# Patient Record
Sex: Female | Born: 1935 | Race: White | Hispanic: No | Smoking: Former smoker
Health system: Southern US, Community
[De-identification: ages and names within clinical notes are randomized; demographics above are authoritative.]

## PROBLEM LIST (undated history)

## (undated) DIAGNOSIS — R55 Syncope and collapse: Secondary | ICD-10-CM

## (undated) DIAGNOSIS — G309 Alzheimer's disease, unspecified: Secondary | ICD-10-CM

## (undated) DIAGNOSIS — E7800 Pure hypercholesterolemia, unspecified: Secondary | ICD-10-CM

## (undated) DIAGNOSIS — F0280 Dementia in other diseases classified elsewhere without behavioral disturbance: Secondary | ICD-10-CM

## (undated) DIAGNOSIS — D509 Iron deficiency anemia, unspecified: Secondary | ICD-10-CM

## (undated) DIAGNOSIS — T7840XA Allergy, unspecified, initial encounter: Secondary | ICD-10-CM

## (undated) DIAGNOSIS — M1990 Unspecified osteoarthritis, unspecified site: Secondary | ICD-10-CM

## (undated) DIAGNOSIS — E785 Hyperlipidemia, unspecified: Secondary | ICD-10-CM

## (undated) DIAGNOSIS — F329 Major depressive disorder, single episode, unspecified: Secondary | ICD-10-CM

## (undated) DIAGNOSIS — I639 Cerebral infarction, unspecified: Secondary | ICD-10-CM

## (undated) DIAGNOSIS — R197 Diarrhea, unspecified: Secondary | ICD-10-CM

## (undated) DIAGNOSIS — I10 Essential (primary) hypertension: Secondary | ICD-10-CM

## (undated) DIAGNOSIS — N189 Chronic kidney disease, unspecified: Secondary | ICD-10-CM

## (undated) HISTORY — DX: Major depressive disorder, single episode, unspecified: F32.9

## (undated) HISTORY — PX: TONSILLECTOMY: SUR1361

## (undated) HISTORY — DX: Pure hypercholesterolemia, unspecified: E78.00

## (undated) HISTORY — DX: Allergy, unspecified, initial encounter: T78.40XA

## (undated) HISTORY — DX: Dementia in other diseases classified elsewhere, unspecified severity, without behavioral disturbance, psychotic disturbance, mood disturbance, and anxiety: F02.80

## (undated) HISTORY — DX: Chronic kidney disease, unspecified: N18.9

## (undated) HISTORY — DX: Alzheimer's disease, unspecified: G30.9

## (undated) HISTORY — DX: Essential (primary) hypertension: I10

## (undated) HISTORY — DX: Syncope and collapse: R55

## (undated) HISTORY — DX: Unspecified osteoarthritis, unspecified site: M19.90

## (undated) HISTORY — PX: OTHER SURGICAL HISTORY: SHX169

## (undated) HISTORY — DX: Diarrhea, unspecified: R19.7

---

## 1999-08-02 ENCOUNTER — Other Ambulatory Visit: Admission: RE | Admit: 1999-08-02 | Discharge: 1999-08-02 | Payer: Self-pay | Admitting: Family Medicine

## 2000-11-07 ENCOUNTER — Other Ambulatory Visit: Admission: RE | Admit: 2000-11-07 | Discharge: 2000-11-07 | Payer: Self-pay | Admitting: Family Medicine

## 2001-03-19 ENCOUNTER — Encounter (INDEPENDENT_AMBULATORY_CARE_PROVIDER_SITE_OTHER): Payer: Self-pay

## 2001-03-19 ENCOUNTER — Ambulatory Visit (HOSPITAL_COMMUNITY): Admission: RE | Admit: 2001-03-19 | Discharge: 2001-03-19 | Payer: Self-pay | Admitting: Gastroenterology

## 2002-08-28 ENCOUNTER — Other Ambulatory Visit: Admission: RE | Admit: 2002-08-28 | Discharge: 2002-08-28 | Payer: Self-pay | Admitting: Family Medicine

## 2008-11-06 ENCOUNTER — Encounter: Admission: RE | Admit: 2008-11-06 | Discharge: 2008-11-06 | Payer: Self-pay | Admitting: Family Medicine

## 2011-04-08 NOTE — Procedures (Signed)
[facility]  Patient:    [name], [name]                      MRN: [id number] Proc. Date: 03/19/01 Adm. Date:  [id number] Attending:  Rich Brave CC:         Desma Maxim, M.D.   Procedure Report  PROCEDURE:  Colonoscopy with polypectomy and hot biopsy.  INDICATIONS:  A 77 year old female with multiple polyps found on flexible sigmoidoscopy, which were adenomatous in character.  FINDINGS:  Six polyps removed.  PROCEDURE:  The nature, purpose and risks of the procedure had been discussed with the patient, who provided written consent.  Sedation was fentanyl 75 mcg and Versed 6 mg IV; without arrhythmias or desaturation.  The Olympus adult video colonoscope was advanced to the cecum, turning the patient into the supine and ultimately the right lateral decubitus positions to facilitate advancement.  I encountered a total of six polyps on this examination.  Two were small, semipedunculated polyps near 50 cm, one of which was hot biopsied and I believe another was snared off.  At 40 cm I encountered two additional small sessile polyps, again this time removed by snare technique; but without cautery because the snare came right through the very soft tissue.  Finally, at about 10 cm from the external anal opening in the rectum, I encountered two polyps; one about 10 mm and the other about 12 mm in diameter, each removed by snare technique with complete hemostasis and no evidence of excessive cautery.  No evidence of cancer was noted anywhere in the colon, nor any evidence of colitis, vascular malformations or diverticular disease.  Retroflexion was not performed in the rectum, due to the proximity of the rectal polypectomies.  The patient tolerated the procedure well and there were no apparent complications.  The quality of the prep was quite good, and it was felt that all areas were well seen; although in the proximal portion of the  ascending colon, there was some residual stool film.  IMPRESSION:  Colon polyps, removed as described above.  PLAN:  Await pathology on polyps.  Consider flexible sigmoidoscopy in 6-12 months to confirm adequacy of excision, especially if any high-grade dysplasia is detected. DD:  03/19/01 TD:  03/19/01 Job: [id number]

## 2011-11-28 ENCOUNTER — Observation Stay (HOSPITAL_COMMUNITY)
Admission: EM | Admit: 2011-11-28 | Discharge: 2011-12-04 | DRG: 494 | Disposition: A | Discharge status: Home / Self Care | Payer: Medicare Other | Attending: Orthopedic Surgery | Admitting: Orthopedic Surgery

## 2011-11-28 ENCOUNTER — Emergency Department (HOSPITAL_COMMUNITY): Payer: Medicare Other

## 2011-11-28 ENCOUNTER — Encounter: Payer: Self-pay | Admitting: *Deleted

## 2011-11-28 DIAGNOSIS — E119 Type 2 diabetes mellitus without complications: Secondary | ICD-10-CM | POA: Diagnosis present

## 2011-11-28 DIAGNOSIS — Y92009 Unspecified place in unspecified non-institutional (private) residence as the place of occurrence of the external cause: Secondary | ICD-10-CM

## 2011-11-28 DIAGNOSIS — S42401A Unspecified fracture of lower end of right humerus, initial encounter for closed fracture: Secondary | ICD-10-CM

## 2011-11-28 DIAGNOSIS — IMO0002 Reserved for concepts with insufficient information to code with codable children: Principal | ICD-10-CM | POA: Diagnosis present

## 2011-11-28 DIAGNOSIS — Z79899 Other long term (current) drug therapy: Secondary | ICD-10-CM | POA: Insufficient documentation

## 2011-11-28 DIAGNOSIS — M810 Age-related osteoporosis without current pathological fracture: Secondary | ICD-10-CM | POA: Diagnosis present

## 2011-11-28 DIAGNOSIS — W010XXA Fall on same level from slipping, tripping and stumbling without subsequent striking against object, initial encounter: Secondary | ICD-10-CM | POA: Diagnosis present

## 2011-11-28 DIAGNOSIS — E785 Hyperlipidemia, unspecified: Secondary | ICD-10-CM | POA: Diagnosis present

## 2011-11-28 HISTORY — DX: Hyperlipidemia, unspecified: E78.5

## 2011-11-28 MED ORDER — FENTANYL CITRATE 0.05 MG/ML IJ SOLN
100.0000 ug | Freq: Once | INTRAMUSCULAR | Status: AC
  Administered 2011-11-28: 100 ug via INTRAVENOUS

## 2011-11-28 MED ORDER — FENTANYL CITRATE 0.05 MG/ML IJ SOLN
INTRAMUSCULAR | Status: AC
  Administered 2011-11-28: 100 ug via INTRAVENOUS
  Filled 2011-11-28: qty 2

## 2011-11-28 MED ORDER — FENTANYL CITRATE 0.05 MG/ML IJ SOLN
100.0000 ug | Freq: Once | INTRAMUSCULAR | Status: AC
  Administered 2011-11-28: 100 ug via INTRAVENOUS
  Filled 2011-11-28: qty 2

## 2011-11-28 NOTE — ED Notes (Signed)
Per EMS: pt fell outside going to mailbox. Slipped on pineneddles and hit asphalt. No LOC, and no neck or back pain. Pt is a little lethargic from fent. Given en route. Pt is A&Ox4. 20 g IV in left forearm. Pt reports pain at 6/10

## 2011-11-28 NOTE — ED Notes (Signed)
UEA:VW09<WJ> Expected date:11/28/11<BR> Expected time: 8:58 PM<BR> Means of arrival:Ambulance<BR> Comments:<BR> M241 - 79yoF Fall, elbow deformity

## 2011-11-28 NOTE — ED Notes (Addendum)
Pt presents with an injury to her right elbow from a fall. Pt denies hitting her head, any LOC. CMS intact. Pt states she was walking out to her mailbox, slipped on pineneedles and fell onto her right elbow. Pt denies any chest pain, shortness of breath, n/v, headaches, blurry vision. Pt was given 250 mcg of fentanyl en route from EMS. Pt states pain is 8/10 at this time. VSS. Will continue to monitor

## 2011-11-29 ENCOUNTER — Encounter (HOSPITAL_COMMUNITY): Payer: Self-pay | Admitting: Physician Assistant

## 2011-11-29 ENCOUNTER — Emergency Department (HOSPITAL_COMMUNITY): Payer: Medicare Other

## 2011-11-29 LAB — DIFFERENTIAL
Basophils Absolute: 0 10*3/uL (ref 0.0–0.1)
Basophils Relative: 1 % (ref 0–1)
Monocytes Absolute: 0.8 10*3/uL (ref 0.1–1.0)
Neutro Abs: 4.9 10*3/uL (ref 1.7–7.7)
Neutrophils Relative %: 57 % (ref 43–77)

## 2011-11-29 LAB — PROTIME-INR: Prothrombin Time: 13.5 seconds (ref 11.6–15.2)

## 2011-11-29 LAB — BASIC METABOLIC PANEL
Chloride: 105 mEq/L (ref 96–112)
Creatinine, Ser: 0.78 mg/dL (ref 0.50–1.10)
GFR calc Af Amer: >90 mL/min (ref >90)

## 2011-11-29 LAB — CBC
MCHC: 33 g/dL (ref 30.0–36.0)
RDW: 13.1 % (ref 11.5–15.5)

## 2011-11-29 MED ORDER — MIRABEGRON ER 25 MG PO TB24
25.0000 mg | ORAL_TABLET | ORAL | Status: DC
  Administered 2011-11-29 – 2011-12-03 (×5): 25 mg via ORAL
  Filled 2011-11-29 (×2): qty 1

## 2011-11-29 MED ORDER — ONDANSETRON HCL 4 MG/2ML IJ SOLN: 4.0000 mg | Freq: Four times a day (QID) | INTRAMUSCULAR | Status: DC | PRN

## 2011-11-29 MED ORDER — SODIUM CHLORIDE 0.9 % IJ SOLN: 9.0000 mL | INTRAMUSCULAR | Status: DC | PRN

## 2011-11-29 MED ORDER — DIPHENHYDRAMINE HCL 50 MG/ML IJ SOLN: 12.5000 mg | Freq: Four times a day (QID) | INTRAMUSCULAR | Status: DC | PRN

## 2011-11-29 MED ORDER — ONDANSETRON HCL 4 MG/2ML IJ SOLN
4.0000 mg | Freq: Three times a day (TID) | INTRAMUSCULAR | Status: DC | PRN
  Filled 2011-11-29: qty 2

## 2011-11-29 MED ORDER — HYDROMORPHONE HCL PF 1 MG/ML IJ SOLN: 0.2000 mg | INTRAMUSCULAR | Status: DC | PRN

## 2011-11-29 MED ORDER — METHOCARBAMOL 100 MG/ML IJ SOLN: 500.0000 mg | Freq: Four times a day (QID) | INTRAVENOUS | Status: DC | PRN

## 2011-11-29 MED ORDER — NON FORMULARY: 25.0000 mg | Status: DC

## 2011-11-29 MED ORDER — HYDROMORPHONE BOLUS VIA INFUSION: 0.2000 mg | INTRAVENOUS | Status: DC | PRN

## 2011-11-29 MED ORDER — MIRABEGRON ER 25 MG PO TB24: 25.0000 mg | ORAL_TABLET | Freq: Every day | ORAL | Status: DC

## 2011-11-29 MED ORDER — HYDROMORPHONE HCL PF 2 MG/ML IJ SOLN
INTRAMUSCULAR | Status: AC
  Administered 2011-11-29: 0.2 mg via INTRAVENOUS
  Filled 2011-11-29: qty 1

## 2011-11-29 MED ORDER — HYDROMORPHONE HCL PF 2 MG/ML IJ SOLN
INTRAMUSCULAR | Status: AC
  Filled 2011-11-29: qty 1

## 2011-11-29 MED ORDER — NALOXONE HCL 0.4 MG/ML IJ SOLN: 0.4000 mg | INTRAMUSCULAR | Status: DC | PRN

## 2011-11-29 MED ORDER — METHOCARBAMOL 500 MG PO TABS
500.0000 mg | ORAL_TABLET | Freq: Four times a day (QID) | ORAL | Status: DC | PRN
  Administered 2011-12-04: 500 mg via ORAL
  Filled 2011-11-29 (×2): qty 1

## 2011-11-29 MED ORDER — HYDROMORPHONE HCL PF 2 MG/ML IJ SOLN
INTRAMUSCULAR | Status: AC
  Administered 2011-11-29: 0.2 mg
  Filled 2011-11-29: qty 1

## 2011-11-29 MED ORDER — DIPHENHYDRAMINE HCL 12.5 MG/5ML PO ELIX: 12.5000 mg | ORAL_SOLUTION | Freq: Four times a day (QID) | ORAL | Status: DC | PRN

## 2011-11-29 MED ORDER — OXYCODONE-ACETAMINOPHEN 5-325 MG PO TABS: 2.0000 | ORAL_TABLET | Freq: Four times a day (QID) | ORAL | Status: DC | PRN

## 2011-11-29 MED ORDER — HYDROMORPHONE HCL PF 1 MG/ML IJ SOLN
1.0000 mg | INTRAMUSCULAR | Status: DC | PRN
  Administered 2011-11-29 (×2): 1 mg via INTRAVENOUS
  Filled 2011-11-29 (×3): qty 1

## 2011-11-29 MED ORDER — METHOCARBAMOL 100 MG/ML IJ SOLN
1000.0000 mg | Freq: Once | INTRAMUSCULAR | Status: DC
  Filled 2011-11-29: qty 10

## 2011-11-29 MED ORDER — METHOCARBAMOL 100 MG/ML IJ SOLN
1000.0000 mg | Freq: Once | INTRAVENOUS | Status: AC
  Administered 2011-11-29: 1000 mg via INTRAVENOUS
  Filled 2011-11-29: qty 10

## 2011-11-29 MED ORDER — ACETAMINOPHEN-CODEINE #3 300-30 MG PO TABS
1.0000 | ORAL_TABLET | ORAL | Status: DC | PRN
  Administered 2011-11-29: 2 via ORAL
  Filled 2011-11-29: qty 2

## 2011-11-29 MED ORDER — SODIUM CHLORIDE 0.9 % IV SOLN
INTRAVENOUS | Status: AC
  Administered 2011-11-29: 17:00:00 via INTRAVENOUS

## 2011-11-29 MED ORDER — HYDROMORPHONE 0.3 MG/ML IV SOLN
INTRAVENOUS | Status: DC
  Administered 2011-11-29: 18:00:00 via INTRAVENOUS
  Administered 2011-11-30: 0.2 mg via INTRAVENOUS
  Administered 2011-11-30: 1.19 mg via INTRAVENOUS
  Administered 2011-11-30: 0.799 mg via INTRAVENOUS
  Administered 2011-11-30: 1.19 mg via INTRAVENOUS
  Administered 2011-11-30: 0.999 mg via INTRAVENOUS
  Administered 2011-11-30: 1.99 mg via INTRAVENOUS
  Administered 2011-11-30: 1.19 mg via INTRAVENOUS
  Administered 2011-11-30: 20:00:00 via INTRAVENOUS
  Administered 2011-12-01: 1.19 mg via INTRAVENOUS
  Administered 2011-12-01: 0.599 mg via INTRAVENOUS
  Administered 2011-12-01: 1.59 mg via INTRAVENOUS
  Administered 2011-12-01: 0.2 mg via INTRAVENOUS
  Administered 2011-12-01: 0.599 mg via INTRAVENOUS
  Administered 2011-12-01: 0.2 mg via INTRAVENOUS
  Administered 2011-12-02: 1.19 mg via INTRAVENOUS
  Administered 2011-12-02: 01:00:00 via INTRAVENOUS
  Administered 2011-12-02: 0.799 mg via INTRAVENOUS
  Administered 2011-12-02: 0.4 mg via INTRAVENOUS
  Administered 2011-12-02: 0.599 mg via INTRAVENOUS
  Filled 2011-11-29 (×3): qty 25

## 2011-11-29 MED ORDER — DOCUSATE SODIUM 100 MG PO CAPS
100.0000 mg | ORAL_CAPSULE | Freq: Two times a day (BID) | ORAL | Status: DC
  Administered 2011-11-29 – 2011-12-04 (×9): 100 mg via ORAL
  Filled 2011-11-29 (×11): qty 1

## 2011-11-29 NOTE — Progress Notes (Signed)
ED CM noted possible surgery indicated in Orthopedic MD note.  CM spoke with pt in TCU Rm #29 and provided list of available [place] home health agencies. No preferences or choice made at this time. Discussed further assistance and orders may come from attending MD and unit CM.  Pt and family wanting to complete POA forms already filled out.  CM spoke with ED SW to assist with completion.

## 2011-11-29 NOTE — ED Notes (Signed)
Pt transferred to tcu 29, pain meds given prior to transfer, currently comfortable, no complaints, call bell within reach, no other complaints at this time

## 2011-11-29 NOTE — Progress Notes (Signed)
I reviewed her xrays with Dr Melvyn Novas who agrees with my assessment--unless her fx fragments shift that she would best be treated non-surgically.

## 2011-11-29 NOTE — H&P (Signed)
[name] is an 78 y.o. female.   Chief Complaint: Pain in Rt. Elbow HPI: Larey Seat last pm gathering mail and slipped on icy pine needles. Fell directly on rt. Elbow. No other injury. To ER. Xray's show comminuted Condylar elbow fx.  N/V intact to RUE.  Past Medical History  Diagnosis Date  . Osteoporosis   . Hyperlipemia     Past Surgical History  Procedure Date  . No past surgeries     History reviewed. No pertinent family history. Social History:  reports that she has quit smoking. She does not have any smokeless tobacco history on file. She reports that she does not drink alcohol. Her drug history not on file.  Allergies:  Allergies  Allergen Reactions  . Sulfa Antibiotics Hives, Diarrhea and Nausea And Vomiting    Facial swelling    Medications Prior to Admission  Medication Dose Route Frequency Provider Last Rate Last Dose  . fentaNYL (SUBLIMAZE) injection 100 mcg  100 mcg Intravenous Once Flint Melter, MD   100 mcg at 11/28/11 2215  . fentaNYL (SUBLIMAZE) injection 100 mcg  100 mcg Intravenous Once Flint Melter, MD   100 mcg at 11/28/11 2312  . HYDROmorphone (DILAUDID) injection 1 mg  1 mg Intravenous Q2H PRN Flint Melter, MD   1 mg at 11/29/11 1610  . ondansetron (ZOFRAN) injection 4 mg  4 mg Intravenous Q8H PRN Flint Melter, MD       No current outpatient prescriptions on file as of 11/28/2011.    No results found for this or any previous visit (from the past 48 hour(s)). Dg Elbow Complete Right  11/28/2011  *RADIOLOGY REPORT*  Clinical Data: Fall.  Right elbow pain.  RIGHT ELBOW - COMPLETE 3+ VIEW  Comparison: None.  Findings: There is a highly comminuted distal humerus fracture, with both radial and ulnar displacement of the epicondylar fragments.  Minimal anterior displacement of the distal humerus on the lateral view.  Fracture is predominately transverse in the metaphyseal region with sagittally oriented fracture planes extending into the trochlea and  separating the epicondyles.  Small bony debris fragment is present adjacent to the radial neck. Radial head appears intact.  Olecranon appears intact with a small insertional triceps spur.  IMPRESSION: Comminuted moderately displaced intra-articular distal humerus fracture.  Original Report Authenticated By: Andreas Newport, M.D.    Review of Systems  Constitutional: Negative.   HENT: Negative.   Eyes: Negative.   Respiratory: Negative.   Cardiovascular: Negative.   Gastrointestinal: Negative.   Genitourinary: Positive for frequency.  Musculoskeletal: Positive for joint pain. Negative for falls.  Skin: Negative.   Neurological: Negative.   Endo/Heme/Allergies: Negative.   Psychiatric/Behavioral: Negative.     Blood pressure 144/59, pulse 82, temperature 98.7 F (37.1 C), temperature source Oral, resp. rate 16, SpO2 94.00%. Physical Exam  Constitutional: She is oriented to person, place, and time. She appears well-developed and well-nourished.  HENT:  Head: Normocephalic and atraumatic.  Eyes: Conjunctivae are normal.  Neck: Neck supple.  Cardiovascular: Normal rate, normal heart sounds and intact distal pulses.   Respiratory: Effort normal and breath sounds normal.  Musculoskeletal: She exhibits tenderness.  Neurological: She is alert and oriented to person, place, and time.  Skin: Skin is warm and dry.  Psychiatric: She has a normal mood and affect.     Assessment/Plan Admit to Dr. Simonne Come.   Plan consult with Dr. Orlan Leavens for consideration of possible ORIF Rt. Elbow.  Prynce Jacober III,Issiac Jamar L 11/29/2011, 8:03  AM    

## 2011-11-29 NOTE — ED Provider Notes (Signed)
History     CSN: [id number]  Arrival date & time 11/28/11  2123   First MD Initiated Contact with Patient 11/28/11 2208      Chief Complaint  Patient presents with  . Fall  . Elbow Pain    (Consider location/radiation/quality/duration/timing/severity/associated sxs/prior treatment) HPI [name] is a 78 y.o. female presents with c/o elbow pain leading to desire to be assessed in the ED. The sx(s) have been present for 2 hours. Additional concerns are no other injuries. Causative factors are accidental fall, when she slipped on some wet pine needles. Palliative factors are holding the elbow still. The distress associated is moderate. The disorder has been present for 2 hours.    Past Medical History  Diagnosis Date  . Osteoporosis   . Hyperlipemia     History reviewed. No pertinent past surgical history.  History reviewed. No pertinent family history.  History  Substance Use Topics  . Smoking status: Not on file  . Smokeless tobacco: Not on file  . Alcohol Use:     OB History    Grav Para Term Preterm Abortions TAB SAB Ect Mult Living                  Review of Systems  All other systems reviewed and are negative.    Allergies  Sulfa antibiotics  Home Medications   Current Outpatient Rx  Name Route Sig Dispense Refill  . ASPIRIN EC 81 MG PO TBEC Oral Take 81 mg by mouth daily.      . B COMPLEX PO TABS Oral Take 1 tablet by mouth daily.      Marland Kitchen VITAMIN D 1000 UNITS PO TABS Oral Take 2,000 Units by mouth 2 (two) times daily.      Marland Kitchen CRANBERRY EXTRACT 200 MG PO CAPS Oral Take 1 capsule by mouth 2 (two) times daily.      . OMEGA-3 FATTY ACIDS 1000 MG PO CAPS Oral Take 1 g by mouth 2 (two) times daily.      . MELOXICAM 15 MG PO TABS Oral Take 15 mg by mouth at bedtime.      . ADULT MULTIVITAMIN W/MINERALS CH Oral Take 1 tablet by mouth daily.      Marland Kitchen PRAVASTATIN SODIUM 40 MG PO TABS Oral Take 40 mg by mouth daily.      Marland Kitchen PRESCRIPTION MEDICATION Oral Take 1  tablet by mouth at bedtime.      Marland Kitchen RALOXIFENE HCL 60 MG PO TABS Oral Take 60 mg by mouth daily.      . TOLTERODINE TARTRATE ER 4 MG PO CP24 Oral Take 4 mg by mouth daily.        BP 147/68  Pulse 84  Temp(Src) 97.8 F (36.6 C) (Oral)  Resp 20  SpO2 95%  Physical Exam  Nursing note and vitals reviewed. Constitutional: She is oriented to person, place, and time. She appears well-developed and well-nourished.  HENT:  Head: Normocephalic and atraumatic.  Eyes: Conjunctivae and EOM are normal. Pupils are equal, round, and reactive to light.  Neck: Normal range of motion and phonation normal. Neck supple.  Cardiovascular: Intact distal pulses.   Pulmonary/Chest: Effort normal. She exhibits no tenderness.  Abdominal: She exhibits no distension. There is no tenderness. There is no guarding.  Musculoskeletal: She exhibits no edema.       Right elbow is tender and swollen. She resists any passive flexion or extension. She holds the elbow in about 160 of extension. She  is neurovascularly intact distally in the right hand.  Neurological: She is alert and oriented to person, place, and time. She has normal strength and normal reflexes. She exhibits normal muscle tone.  Skin: Skin is warm and dry.  Psychiatric: She has a normal mood and affect. Her behavior is normal. Judgment and thought content normal.    ED Course  Procedures (including critical care time)  Labs Reviewed - No data to display Dg Elbow Complete Right  11/28/2011  *RADIOLOGY REPORT*  Clinical Data: Fall.  Right elbow pain.  RIGHT ELBOW - COMPLETE 3+ VIEW  Comparison: None.  Findings: There is a highly comminuted distal humerus fracture, with both radial and ulnar displacement of the epicondylar fragments.  Minimal anterior displacement of the distal humerus on the lateral view.  Fracture is predominately transverse in the metaphyseal region with sagittally oriented fracture planes extending into the trochlea and separating the  epicondyles.  Small bony debris fragment is present adjacent to the radial neck. Radial head appears intact.  Olecranon appears intact with a small insertional triceps spur.  IMPRESSION: Comminuted moderately displaced intra-articular distal humerus fracture.  Original Report Authenticated By: Andreas Newport, M.D.   Reeval: She was treated with multiple doses of fentanyl and a right elbow splint in the emergency department and still has pain in her elbow despite the splint that was placed.   1. Elbow fracture, right       MDM  Mechanical fall with isolated right elbow injury. She is admitted for orthopedic management        Flint Melter, MD 11/29/11 0021

## 2011-11-29 NOTE — ED Notes (Signed)
[facility] - pt's daughter- [phone] home, cell [phone]. Contact person

## 2011-11-30 MED ORDER — ADULT MULTIVITAMIN W/MINERALS CH
1.0000 | ORAL_TABLET | Freq: Every day | ORAL | Status: DC
  Administered 2011-11-30 – 2011-12-04 (×5): 1 via ORAL
  Filled 2011-11-30 (×5): qty 1

## 2011-11-30 MED ORDER — CRANBERRY EXTRACT 200 MG PO CAPS: 1.0000 | ORAL_CAPSULE | Freq: Two times a day (BID) | ORAL | Status: DC

## 2011-11-30 MED ORDER — RALOXIFENE HCL 60 MG PO TABS
60.0000 mg | ORAL_TABLET | Freq: Every day | ORAL | Status: DC
  Administered 2011-11-30 – 2011-12-04 (×5): 60 mg via ORAL
  Filled 2011-11-30 (×5): qty 1

## 2011-11-30 MED ORDER — B COMPLEX PO TABS
1.0000 | ORAL_TABLET | Freq: Every day | ORAL | Status: DC
  Filled 2011-11-30: qty 1

## 2011-11-30 MED ORDER — MELOXICAM 15 MG PO TABS
15.0000 mg | ORAL_TABLET | Freq: Every day | ORAL | Status: DC
  Administered 2011-11-30 – 2011-12-03 (×4): 15 mg via ORAL
  Filled 2011-11-30 (×5): qty 1

## 2011-11-30 MED ORDER — OMEGA-3-ACID ETHYL ESTERS 1 G PO CAPS
1.0000 g | ORAL_CAPSULE | Freq: Every day | ORAL | Status: DC
  Administered 2011-11-30 – 2011-12-04 (×5): 1 g via ORAL
  Filled 2011-11-30 (×5): qty 1

## 2011-11-30 MED ORDER — B COMPLEX-C PO TABS
1.0000 | ORAL_TABLET | Freq: Every day | ORAL | Status: DC
  Administered 2011-11-30 – 2011-12-04 (×5): 1 via ORAL
  Filled 2011-11-30 (×5): qty 1

## 2011-11-30 MED ORDER — VITAMIN D3 25 MCG (1000 UNIT) PO TABS
2000.0000 [IU] | ORAL_TABLET | Freq: Two times a day (BID) | ORAL | Status: DC
  Administered 2011-11-30 – 2011-12-04 (×9): 2000 [IU] via ORAL
  Filled 2011-11-30 (×10): qty 2

## 2011-11-30 MED ORDER — OMEGA-3 FATTY ACIDS 1000 MG PO CAPS
1.0000 g | ORAL_CAPSULE | Freq: Two times a day (BID) | ORAL | Status: DC
  Filled 2011-11-30 (×2): qty 1

## 2011-11-30 MED ORDER — SIMVASTATIN 20 MG PO TABS
20.0000 mg | ORAL_TABLET | Freq: Every day | ORAL | Status: DC
  Administered 2011-11-30 – 2011-12-03 (×4): 20 mg via ORAL
  Filled 2011-11-30 (×5): qty 1

## 2011-11-30 MED ORDER — ASPIRIN EC 81 MG PO TBEC
81.0000 mg | DELAYED_RELEASE_TABLET | Freq: Every day | ORAL | Status: DC
  Administered 2011-11-30 – 2011-12-04 (×5): 81 mg via ORAL
  Filled 2011-11-30 (×6): qty 1

## 2011-11-30 MED ORDER — TOLTERODINE TARTRATE ER 4 MG PO CP24
4.0000 mg | ORAL_CAPSULE | Freq: Every day | ORAL | Status: DC
  Administered 2011-12-04: 4 mg via ORAL
  Filled 2011-11-30 (×5): qty 1

## 2011-11-30 MED ORDER — SIMVASTATIN 40 MG PO TABS: 40.0000 mg | ORAL_TABLET | Freq: Every day | ORAL | Status: DC

## 2011-11-30 NOTE — Progress Notes (Signed)
PHARMACIST - PHYSICIAN ORDER COMMUNICATION  CONCERNING: P&T Medication Policy on Herbal Medications  DESCRIPTION:  This patient's order for:  Cranberry extract  has been noted.  This product(s) is classified as an "herbal" or natural product. Due to a lack of definitive safety studies or FDA approval, nonstandard manufacturing practices, plus the potential risk of unknown drug-drug interactions while on inpatient medications, the Pharmacy and Therapeutics Committee does not permit the use of "herbal" or natural products of this type within [facility].   ACTION TAKEN: The pharmacy department is unable to verify this order at this time and your patient has been informed of this safety policy. Please reevaluate patient's clinical condition at discharge and address if the herbal or natural product(s) should be resumed at that time.  Dorethea Clan 11/30/2011

## 2011-11-30 NOTE — Progress Notes (Signed)
Subjective: Pain in rt elbow has decreased since yesterday   Objective: Vital signs in last 24 hours: Temp:  [98.6 F (37 C)-100.4 F (38 C)] 99 F (37.2 C) (01/09 0500) Pulse Rate:  [76-90] 81  (01/09 0500) Resp:  [13-18] 16  (01/09 0500) BP: (121-145)/(47-73) 145/73 mmHg (01/09 0500) SpO2:  [90 %-97 %] 95 % (01/09 1228) Weight:  [83 kg (182 lb 15.7 oz)] 83 kg (182 lb 15.7 oz) (01/08 1649)  Intake/Output from previous day: 01/08 0701 - 01/09 0700 In: 867.8 [P.O.:170; I.V.:697.8] Out: -  Intake/Output this shift:     Basename 11/29/11 0855  HGB 11.0*    Basename 11/29/11 0855  WBC 8.6  RBC 3.68*  HCT 33.3*  PLT 243    Basename 11/29/11 0855  NA 137  K 4.0  CL 105  CO2 26  BUN 18  CREATININE 0.78  GLUCOSE 111*  CALCIUM 8.4    Basename 11/29/11 0855  LABPT --  INR 1.01    NV intact.  Splint ok.  Assessment/Plan: Still needs iv pain meds.  Plan--discharge when off iv meds.   Ohana Birdwell P 11/30/2011, 12:53 PM

## 2011-12-01 NOTE — Progress Notes (Signed)
Patient ID: [name], female   DOB: 12-Apr-1932, 79 y.o.   MRN: [id number] Her pain is decreased--probably home in 1-2 days.

## 2011-12-02 MED ORDER — ACETAMINOPHEN-CODEINE #4 300-60 MG PO TABS
1.0000 | ORAL_TABLET | ORAL | Status: DC | PRN
  Administered 2011-12-02: 1 via ORAL
  Filled 2011-12-02 (×2): qty 1

## 2011-12-02 MED ORDER — ACETAMINOPHEN-CODEINE #4 300-60 MG PO TABS
1.0000 | ORAL_TABLET | ORAL | Status: DC | PRN
  Administered 2011-12-02 – 2011-12-04 (×11): 1 via ORAL
  Filled 2011-12-02 (×6): qty 1
  Filled 2011-12-02: qty 2
  Filled 2011-12-02 (×4): qty 1

## 2011-12-02 NOTE — Progress Notes (Signed)
Pt reports 1 tablet of tylenol # 4 ineffective. MD notified. New order given to increase dose. Pt made aware. Vwilliams,rn.

## 2011-12-02 NOTE — Progress Notes (Signed)
Patient ID: [name], female   DOB: Apr 20, 1932, 78 y.o.   MRN: [id number] Slow progress continues.  She would lie to try po med(tylenol +4)  And go home 1/13 when she will have support help at home.

## 2011-12-03 NOTE — Progress Notes (Signed)
[name]  MRN: [id number] DOB/Age: 28-Dec-1931 78 y.o. Physician: Lynnea Maizes, M.D.      Subjective: My arm feels so much better after Dr. Orlan Leavens put on a new splint on last night. Vital Signs Temp:  [98.4 F (36.9 C)-98.6 F (37 C)] 98.4 F (36.9 C) (01/12 0500) Pulse Rate:  [75-88] 83  (01/12 0500) Resp:  [20] 20  (01/12 0500) BP: (137-162)/(69-76) 138/69 mmHg (01/12 0500) SpO2:  [89 %-98 %] 95 % (01/12 0500)  Lab Results No results found for this basename: WBC:2,HGB:2,HCT:2,PLT:2 in the last 72 hours BMET No results found for this basename: NA:2,K:2,CL:2,CO2:2,GLUCOSE:2,BUN:2,CREATININE:2,CALCIUM:2 in the last 72 hours INR  Date Value Range Status  11/29/2011 1.01  0.00-1.49 (no units) Final     Exam  RUE in well padded splint, N/V intact  Plan Patient spoke with Dr. Orlan Leavens last night and plan is for discharge Sunday, f/u Tuesday with Dr. Orlan Leavens, initial plan is for conservative management of elbow fracture. Will order OT and HH eval. Matisha Termine M 12/03/2011, 10:53 AM

## 2011-12-03 NOTE — Progress Notes (Signed)
CM spoke with pt concerning d/c planning. Pt to discharge with HHOT/HHA per MD order. Per pt choice [facility] to provide [facility] services. [facility] rep Talmadge Coventry notified of referrral. Pt states going to live with friends in [place] for assistance with home care and supervision. Pt states having no other HH needs or DME.  Leonie Green [phone]

## 2011-12-04 MED ORDER — ACETAMINOPHEN-CODEINE #4 300-60 MG PO TABS: 1.0000 | ORAL_TABLET | ORAL | Status: AC | PRN

## 2011-12-04 NOTE — Progress Notes (Signed)
Rt. Arm wrapped in an ace and held in a sling. Pt able to move fingers at command. They are warm to touch. Sensation intact. Discharge instructions explained to patient and script given for pain medicine. Pt. Discharged to daughter via WC. No questions asked by pt.

## 2011-12-04 NOTE — Progress Notes (Signed)
[name]  MRN: [id number] DOB/Age: 1932/02/08 78 y.o. Physician: Jacquelyne Balint Procedure:       Subjective: Pain controlled. Arrangements for home made and follow up  Vital Signs Temp:  [98.2 F (36.8 C)-98.8 F (37.1 C)] 98.2 F (36.8 C) (01/13 0524) Pulse Rate:  [74-76] 76  (01/13 0524) Resp:  [16-18] 18  (01/13 0524) BP: (129-143)/(73-81) 132/73 mmHg (01/13 0524) SpO2:  [91 %-93 %] 91 % (01/13 0524)  Lab Results No results found for this basename: WBC:2,HGB:2,HCT:2,PLT:2 in the last 72 hours BMET No results found for this basename: NA:2,K:2,CL:2,CO2:2,GLUCOSE:2,BUN:2,CREATININE:2,CALCIUM:2 in the last 72 hours INR  Date Value Range Status  11/29/2011 1.01  0.00-1.49 (no units) Final     Exam RUE in well padded splint.NVI         Plan Discharge home to fu with dr. Vedia Pereyra for Dr.Kevin Supple 12/04/2011, 9:36 AM

## 2011-12-04 NOTE — Progress Notes (Signed)
Occupational Therapy Evaluation Patient Details Name: [name] MRN: [id number] DOB: 1932/06/08 Today's Date: 12/04/2011  Problem List: There is no problem list on file for this patient.   Past Medical History:  Past Medical History  Diagnosis Date  . Osteoporosis   . Hyperlipemia    Past Surgical History:  Past Surgical History  Procedure Date  . Tonsillectomy   . Left knee arthroscopy     OT Assessment/Plan/Recommendation OT Assessment Clinical Impression Statement: Pt presents to OT with decreased I with ADL activity s/p elbow fracture.  Pt will benefit from skilled OT to increase I with ADL activity s/p fracture OT Recommendation/Assessment: All further OT needs can be met in the next venue of care OT Problem List: Decreased strength;Decreased range of motion OT Therapy Diagnosis : Generalized weakness;Acute pain OT Recommendation Follow Up Recommendations: Home health OT;Outpatient OT Individuals Consulted Consulted and Agree with Results and Recommendations: Patient     OT Evaluation Precautions/Restrictions  Restrictions Weight Bearing Restrictions: No Prior Functioning Home Living Lives With: Alone (pt going to live with a friend) Home Layout: One level Home Access: Level entry Bathroom Toilet: Standard Home Adaptive Equipment: Bedside commode/3-in-1 Prior Function Level of Independence: Independent with basic ADLs;Independent with homemaking with ambulation;Independent with transfers;Independent with gait 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) ADL ADL Eating/Feeding: Performed;Minimal assistance Where Assessed - Eating/Feeding: Edge of bed Grooming: Minimal assistance;Performed Where Assessed - Grooming: Sitting, bed;Unsupported Upper Body Bathing: Simulated;Minimal assistance Where Assessed - Upper Body Bathing: Unsupported;Sitting, bed Lower Body Bathing: Moderate assistance Where Assessed - Lower Body Bathing: Sitting, bed;Sit to stand from bed Upper Body  Dressing: Simulated;Minimal assistance Where Assessed - Upper Body Dressing: Sitting, chair;Unsupported Lower Body Dressing: Moderate assistance;Simulated Where Assessed - Lower Body Dressing: Sit to stand from bed Toilet Transfer: Performed;Minimal assistance Toilet Transfer Method: Ambulating (pt has to hold L elbow in standing for support) Toilet Transfer Equipment: Comfort height toilet Toileting - Clothing Manipulation: Performed;Minimal assistance Where Assessed - Toileting Clothing Manipulation: Standing Toileting - Hygiene: Simulated;Minimal assistance Where Assessed - Toileting Hygiene: Standing Vision/Perception  Vision - History Baseline Vision: No visual deficits Cognition Cognition Arousal/Alertness: Awake/alert Overall Cognitive Status: Appears within functional limits for tasks assessed Orientation Level: Oriented X4    Extremity Assessment RUE Assessment RUE Assessment:  (pt able to move all fingers WNL. no edema in fingers.  ) LUE Assessment LUE Assessment: Within Functional Limits    End of Session OT - End of Session Activity Tolerance: Patient tolerated treatment well Patient left: in bed;with call bell in reach General Behavior During Session: [facility] for tasks performed Cognition: [facility] for tasks performed   [facility], [place] Kung 12/04/2011, 8:34 AM

## 2011-12-06 ENCOUNTER — Encounter (HOSPITAL_COMMUNITY): Payer: Self-pay | Admitting: *Deleted

## 2011-12-06 NOTE — Progress Notes (Signed)
Discharge summary sent to payer through [facility]  

## 2011-12-07 ENCOUNTER — Inpatient Hospital Stay (HOSPITAL_COMMUNITY)
Admission: RE | Admit: 2011-12-07 | Discharge: 2011-12-16 | DRG: 493 | Disposition: A | Discharge status: Skilled Nursing Facility | Payer: Medicare Other | Source: Ambulatory Visit | Attending: Orthopedic Surgery | Admitting: Orthopedic Surgery

## 2011-12-07 ENCOUNTER — Encounter (HOSPITAL_COMMUNITY)
Admission: RE | Disposition: A | Discharge status: Skilled Nursing Facility | Payer: Self-pay | Source: Ambulatory Visit | Attending: Orthopedic Surgery

## 2011-12-07 ENCOUNTER — Other Ambulatory Visit: Payer: Self-pay

## 2011-12-07 ENCOUNTER — Encounter (HOSPITAL_COMMUNITY): Payer: Self-pay

## 2011-12-07 DIAGNOSIS — I959 Hypotension, unspecified: Secondary | ICD-10-CM | POA: Diagnosis not present

## 2011-12-07 DIAGNOSIS — I498 Other specified cardiac arrhythmias: Secondary | ICD-10-CM | POA: Diagnosis not present

## 2011-12-07 DIAGNOSIS — M25529 Pain in unspecified elbow: Secondary | ICD-10-CM | POA: Diagnosis present

## 2011-12-07 DIAGNOSIS — E119 Type 2 diabetes mellitus without complications: Secondary | ICD-10-CM | POA: Diagnosis present

## 2011-12-07 DIAGNOSIS — E785 Hyperlipidemia, unspecified: Secondary | ICD-10-CM | POA: Diagnosis present

## 2011-12-07 DIAGNOSIS — D509 Iron deficiency anemia, unspecified: Secondary | ICD-10-CM | POA: Clinically undetermined

## 2011-12-07 DIAGNOSIS — S42202A Unspecified fracture of upper end of left humerus, initial encounter for closed fracture: Secondary | ICD-10-CM | POA: Diagnosis present

## 2011-12-07 DIAGNOSIS — R0902 Hypoxemia: Secondary | ICD-10-CM | POA: Diagnosis not present

## 2011-12-07 DIAGNOSIS — I472 Ventricular tachycardia: Secondary | ICD-10-CM

## 2011-12-07 DIAGNOSIS — IMO0002 Reserved for concepts with insufficient information to code with codable children: Principal | ICD-10-CM | POA: Diagnosis present

## 2011-12-07 DIAGNOSIS — D62 Acute posthemorrhagic anemia: Secondary | ICD-10-CM | POA: Diagnosis not present

## 2011-12-07 DIAGNOSIS — S42309A Unspecified fracture of shaft of humerus, unspecified arm, initial encounter for closed fracture: Secondary | ICD-10-CM

## 2011-12-07 DIAGNOSIS — M810 Age-related osteoporosis without current pathological fracture: Secondary | ICD-10-CM | POA: Diagnosis present

## 2011-12-07 DIAGNOSIS — W19XXXA Unspecified fall, initial encounter: Secondary | ICD-10-CM | POA: Diagnosis present

## 2011-12-07 HISTORY — DX: Iron deficiency anemia, unspecified: D50.9

## 2011-12-07 LAB — GLUCOSE, CAPILLARY: Glucose-Capillary: 104 mg/dL — ABNORMAL HIGH (ref 70–99)

## 2011-12-07 LAB — MRSA CULTURE

## 2011-12-07 LAB — SURGICAL PCR SCREEN: MRSA, PCR: INVALID — AB

## 2011-12-07 SURGERY — OPEN REDUCTION INTERNAL FIXATION (ORIF) DISTAL HUMERUS FRACTURE
Anesthesia: Choice | Laterality: Right

## 2011-12-07 MED ORDER — ACETAMINOPHEN-CODEINE #4 300-60 MG PO TABS: 1.0000 | ORAL_TABLET | ORAL | Status: DC | PRN

## 2011-12-07 MED ORDER — HYDROCODONE-ACETAMINOPHEN 5-325 MG PO TABS: 1.0000 | ORAL_TABLET | ORAL | Status: DC | PRN

## 2011-12-07 MED ORDER — CHLORHEXIDINE GLUCONATE 4 % EX LIQD: 60.0000 mL | Freq: Once | CUTANEOUS | Status: DC

## 2011-12-07 MED ORDER — LACTATED RINGERS IV SOLN
INTRAVENOUS | Status: DC
  Administered 2011-12-07: 14:00:00 via INTRAVENOUS

## 2011-12-07 MED ORDER — OXYCODONE HCL 5 MG PO TABS: 5.0000 mg | ORAL_TABLET | ORAL | Status: DC | PRN

## 2011-12-07 MED ORDER — METHOCARBAMOL 100 MG/ML IJ SOLN
500.0000 mg | Freq: Four times a day (QID) | INTRAVENOUS | Status: DC | PRN
  Filled 2011-12-07: qty 5

## 2011-12-07 MED ORDER — PROMETHAZINE HCL 25 MG/ML IJ SOLN
6.2500 mg | INTRAMUSCULAR | Status: DC | PRN
  Administered 2011-12-07: 6.25 mg via INTRAVENOUS

## 2011-12-07 MED ORDER — HYDROMORPHONE HCL PF 1 MG/ML IJ SOLN
0.5000 mg | INTRAMUSCULAR | Status: DC | PRN
  Administered 2011-12-07 – 2011-12-09 (×7): 1 mg via INTRAVENOUS
  Filled 2011-12-07 (×10): qty 1

## 2011-12-07 MED ORDER — VITAMIN C 500 MG PO TABS
1000.0000 mg | ORAL_TABLET | Freq: Every day | ORAL | Status: DC
  Administered 2011-12-07 – 2011-12-15 (×8): 1000 mg via ORAL
  Filled 2011-12-07 (×11): qty 2

## 2011-12-07 MED ORDER — CEFAZOLIN SODIUM-DEXTROSE 2-3 GM-% IV SOLR: 2.0000 g | INTRAVENOUS | Status: DC

## 2011-12-07 MED ORDER — ONDANSETRON HCL 4 MG/2ML IJ SOLN: 4.0000 mg | Freq: Four times a day (QID) | INTRAMUSCULAR | Status: DC | PRN

## 2011-12-07 MED ORDER — MORPHINE SULFATE 2 MG/ML IJ SOLN
INTRAMUSCULAR | Status: AC
  Administered 2011-12-07: 2 mg via INTRAVENOUS
  Filled 2011-12-07: qty 1

## 2011-12-07 MED ORDER — DIPHENHYDRAMINE HCL 25 MG PO CAPS
25.0000 mg | ORAL_CAPSULE | Freq: Four times a day (QID) | ORAL | Status: DC | PRN
  Filled 2011-12-07: qty 2

## 2011-12-07 MED ORDER — ONDANSETRON HCL 4 MG PO TABS
4.0000 mg | ORAL_TABLET | Freq: Four times a day (QID) | ORAL | Status: DC | PRN
  Filled 2011-12-07: qty 1

## 2011-12-07 MED ORDER — KCL IN DEXTROSE-NACL 20-5-0.45 MEQ/L-%-% IV SOLN
INTRAVENOUS | Status: DC
  Filled 2011-12-07 (×2): qty 1000

## 2011-12-07 MED ORDER — DOCUSATE SODIUM 100 MG PO CAPS
100.0000 mg | ORAL_CAPSULE | Freq: Two times a day (BID) | ORAL | Status: DC
  Administered 2011-12-07 – 2011-12-08 (×3): 100 mg via ORAL
  Filled 2011-12-07 (×8): qty 1

## 2011-12-07 MED ORDER — PROMETHAZINE HCL 25 MG/ML IJ SOLN
INTRAMUSCULAR | Status: AC
  Administered 2011-12-07: 6.25 mg via INTRAVENOUS
  Filled 2011-12-07: qty 1

## 2011-12-07 MED ORDER — MORPHINE SULFATE 2 MG/ML IJ SOLN
2.0000 mg | INTRAMUSCULAR | Status: DC | PRN
  Administered 2011-12-07: 2 mg via INTRAVENOUS

## 2011-12-07 MED ORDER — METHOCARBAMOL 500 MG PO TABS
500.0000 mg | ORAL_TABLET | Freq: Four times a day (QID) | ORAL | Status: DC | PRN
  Administered 2011-12-08: 500 mg via ORAL

## 2011-12-07 SURGICAL SUPPLY — 39 items
BAG ZIPLOCK 12X15 (MISCELLANEOUS) IMPLANT
BANDAGE GAUZE ELAST BULKY 4 IN (GAUZE/BANDAGES/DRESSINGS) IMPLANT
BLADE SURG SZ10 CARB STEEL (BLADE) IMPLANT
BNDG COHESIVE 4X5 TAN STRL (GAUZE/BANDAGES/DRESSINGS) IMPLANT
CANISTER SUCTION 2500CC (MISCELLANEOUS) IMPLANT
CLOTH BEACON ORANGE TIMEOUT ST (SAFETY) IMPLANT
CORDS BIPOLAR (ELECTRODE) IMPLANT
CUFF TOURN SGL QUICK 18 (TOURNIQUET CUFF) IMPLANT
DRAIN PENROSE 18X1/4 LTX STRL (WOUND CARE) IMPLANT
DRAPE C-ARM 42X72 X-RAY (DRAPES) IMPLANT
DRAPE OEC MINIVIEW 54X84 (DRAPES) IMPLANT
DRAPE SURG 17X11 SM STRL (DRAPES) IMPLANT
DRSG EMULSION OIL 3X3 NADH (GAUZE/BANDAGES/DRESSINGS) IMPLANT
ELECT REM PT RETURN 9FT ADLT (ELECTROSURGICAL)
ELECTRODE REM PT RTRN 9FT ADLT (ELECTROSURGICAL) IMPLANT
GLOVE SURG ORTHO 8.0 STRL STRW (GLOVE) IMPLANT
GOWN STRL REIN XL XLG (GOWN DISPOSABLE) IMPLANT
KIT BASIN OR (CUSTOM PROCEDURE TRAY) IMPLANT
LOOP VESSEL MAXI BLUE (MISCELLANEOUS) IMPLANT
MANIFOLD NEPTUNE II (INSTRUMENTS) IMPLANT
NS IRRIG 1000ML POUR BTL (IV SOLUTION) IMPLANT
PACK LOWER EXTREMITY WL (CUSTOM PROCEDURE TRAY) IMPLANT
PAD CAST 3X4 CTTN HI CHSV (CAST SUPPLIES) IMPLANT
PAD CAST 4YDX4 CTTN HI CHSV (CAST SUPPLIES) IMPLANT
PADDING CAST COTTON 3X4 STRL (CAST SUPPLIES)
PADDING CAST COTTON 4X4 STRL (CAST SUPPLIES)
POSITIONER SURGICAL ARM (MISCELLANEOUS) IMPLANT
SOL PREP POV-IOD 16OZ 10% (MISCELLANEOUS) IMPLANT
SOL PREP PROV IODINE SCRUB 4OZ (MISCELLANEOUS) IMPLANT
SPONGE GAUZE 4X4 12PLY (GAUZE/BANDAGES/DRESSINGS) IMPLANT
SPONGE LAP 4X18 X RAY DECT (DISPOSABLE) IMPLANT
SPONGE SURGIFOAM ABS GEL 100 (HEMOSTASIS) IMPLANT
SUT MERSILENE 3 0 FS 1 (SUTURE) IMPLANT
SUT PROLENE 3 0 PS 2 (SUTURE) IMPLANT
SUT VIC AB 1 CTX 36 (SUTURE)
SUT VIC AB 1 CTX36XBRD ANBCTR (SUTURE) IMPLANT
SUT VIC AB 2-0 CTX 36 (SUTURE) IMPLANT
TOWEL OR 17X26 10 PK STRL BLUE (TOWEL DISPOSABLE) IMPLANT
WATER STERILE IRR 1500ML POUR (IV SOLUTION) IMPLANT

## 2011-12-07 NOTE — Progress Notes (Signed)
Call to Dr Acey Lav  Re pain and nausea preop

## 2011-12-07 NOTE — Progress Notes (Signed)
Nausea completely  Relieved past medication.

## 2011-12-07 NOTE — Progress Notes (Signed)
Patients surgery cancelled for today and rescheduled for 12/09/2011 per Dr Melvyn Novas, MD.  Orders written for patient to be admitted. Patient sent to short stay until bed becomes available on the unit.

## 2011-12-07 NOTE — Progress Notes (Signed)
Call to VAS team to start preop IV

## 2011-12-07 NOTE — Progress Notes (Signed)
Pulse O2 contin. To patient past medication. O2 sat 95%.

## 2011-12-08 MED ORDER — OMEGA-3-ACID ETHYL ESTERS 1 G PO CAPS
1.0000 g | ORAL_CAPSULE | Freq: Two times a day (BID) | ORAL | Status: DC
  Administered 2011-12-08 – 2011-12-15 (×13): 1 g via ORAL
  Filled 2011-12-08 (×17): qty 1

## 2011-12-08 MED ORDER — SIMVASTATIN 40 MG PO TABS
40.0000 mg | ORAL_TABLET | Freq: Every day | ORAL | Status: DC
  Administered 2011-12-08: 40 mg via ORAL
  Filled 2011-12-08 (×4): qty 1

## 2011-12-08 MED ORDER — MIRABEGRON ER 25 MG PO TB24
25.0000 mg | ORAL_TABLET | Freq: Every day | ORAL | Status: DC
  Administered 2011-12-08 – 2011-12-14 (×7): 25 mg via ORAL
  Filled 2011-12-08 (×6): qty 1

## 2011-12-08 MED ORDER — NON FORMULARY: 25.0000 mg | Freq: Every day | Status: DC

## 2011-12-08 MED ORDER — VITAMIN D3 25 MCG (1000 UNIT) PO TABS
1000.0000 [IU] | ORAL_TABLET | Freq: Two times a day (BID) | ORAL | Status: DC
  Administered 2011-12-08: 1000 [IU] via ORAL
  Filled 2011-12-08 (×5): qty 1

## 2011-12-08 MED ORDER — B COMPLEX-C PO TABS
1.0000 | ORAL_TABLET | Freq: Every day | ORAL | Status: DC
  Administered 2011-12-08 – 2011-12-15 (×7): 1 via ORAL
  Filled 2011-12-08 (×9): qty 1

## 2011-12-08 MED ORDER — ADULT MULTIVITAMIN W/MINERALS CH
1.0000 | ORAL_TABLET | Freq: Every day | ORAL | Status: DC
  Administered 2011-12-10 – 2011-12-15 (×6): 1 via ORAL
  Filled 2011-12-08 (×8): qty 1

## 2011-12-08 NOTE — Progress Notes (Signed)
Paged md about pt's home meds. He gave verbal orders on what to continue and not continue.  Also ordered npo after midnight tonight for surgery in am.

## 2011-12-08 NOTE — H&P (Signed)
[name], [name] NO.:  [id number]  MEDICAL RECORD NO.:  [id number]  LOCATION:  1609                         FACILITY:  [facility]  PHYSICIAN:  Madelynn Done, MD  DATE OF BIRTH:  10-16-1932  DATE OF ADMISSION:  12/07/2011 DATE OF DISCHARGE:                             HISTORY & PHYSICAL   The patient did have a full history and physical dated November 29, 2011. This is an update.  The patient was seen and evaluated.  She is scheduled to undergo elbow surgery today for the displaced T-condylar distal humerus fracture. Given the timing and the nature of the procedure, the patient's procedure will be postponed.  We are postponing the procedure and the patient will be admitted to the hospital for IV pain medication and pain management.  She is living with her friend and also has transportation issues, I think it is greatly beneficial for the patient to stay in the hospital until her procedure is scheduled.  It will be less than 48 hours.  She is unable to care for herself with her right arm in the condition in which it is in.  I will administer IV pain medications and she will stay in inpatient until the planned surgery.  The patient voiced understanding of this plan.  All questions were addressed with Ms. [name].  The plan for the patient is for her to undergo open reduction internal fixation of the displaced distal humerus fracture. Admission orders will be written.  Preoperative labs were done today. No new labs will be done preoperatively for the scheduled procedure in less than 48 hours.     Madelynn Done, MD     FWO/MEDQ  D:  12/07/2011  T:  12/08/2011  Job:  [id number]

## 2011-12-08 NOTE — Anesthesia Preprocedure Evaluation (Addendum)
Anesthesia Evaluation  Patient identified by MRN, date of birth, ID band Patient awake    Reviewed: Allergy & Precautions, H&P , NPO status , Patient's Chart, lab work & pertinent test results  Airway Mallampati: II TM Distance: >3 FB Neck ROM: Full    Dental  (+) Dental Advisory Given and Caps,    Pulmonary neg pulmonary ROS,  clear to auscultation  Pulmonary exam normal       Cardiovascular neg cardio ROS Regular Normal Denies cardiac symptoms   Neuro/Psych Negative Neurological ROS  Negative Psych ROS   GI/Hepatic negative GI ROS, Neg liver ROS,   Endo/Other  Negative Endocrine ROSDiabetes mellitus-, Type 2DM-recent Dx, no Rx, diet controlled  Renal/GU negative Renal ROS  Genitourinary negative   Musculoskeletal negative musculoskeletal ROS (+)   Abdominal   Peds negative pediatric ROS (+)  Hematology Relative anemia, Hgb 11.0   Anesthesia Other Findings Caps everywhere, implants  Reproductive/Obstetrics negative OB ROS                         Anesthesia Physical Anesthesia Plan  ASA: II  Anesthesia Plan: General   Post-op Pain Management:    Induction: Intravenous  Airway Management Planned: Oral ETT  Additional Equipment:   Intra-op Plan:   Post-operative Plan: Extubation in OR  Informed Consent: I have reviewed the patients History and Physical, chart, labs and discussed the procedure including the risks, benefits and alternatives for the proposed anesthesia with the patient or authorized representative who has indicated his/her understanding and acceptance.   Dental advisory given  Plan Discussed with:   Anesthesia Plan Comments:         Anesthesia Quick Evaluation

## 2011-12-09 ENCOUNTER — Encounter (HOSPITAL_COMMUNITY): Payer: Self-pay | Admitting: Anesthesiology

## 2011-12-09 ENCOUNTER — Encounter (HOSPITAL_COMMUNITY)
Admission: RE | Disposition: A | Discharge status: Skilled Nursing Facility | Payer: Self-pay | Source: Ambulatory Visit | Attending: Orthopedic Surgery

## 2011-12-09 ENCOUNTER — Inpatient Hospital Stay (HOSPITAL_COMMUNITY): Payer: Medicare Other | Admitting: Anesthesiology

## 2011-12-09 ENCOUNTER — Ambulatory Visit: Admit: 2011-12-09 | Payer: Self-pay | Admitting: Orthopedic Surgery

## 2011-12-09 HISTORY — PX: ORIF HUMERUS FRACTURE: SHX2126

## 2011-12-09 LAB — ABO/RH: ABO/RH(D): O POS

## 2011-12-09 LAB — TYPE AND SCREEN: ABO/RH(D): O POS

## 2011-12-09 SURGERY — OPEN REDUCTION INTERNAL FIXATION (ORIF) DISTAL HUMERUS FRACTURE
Anesthesia: General | Site: Arm Lower | Laterality: Right | Wound class: Clean

## 2011-12-09 MED ORDER — HYDROMORPHONE HCL PF 1 MG/ML IJ SOLN
0.5000 mg | INTRAMUSCULAR | Status: DC | PRN
  Administered 2011-12-09 – 2011-12-14 (×2): 1 mg via INTRAVENOUS
  Filled 2011-12-09 (×2): qty 1

## 2011-12-09 MED ORDER — DOCUSATE SODIUM 100 MG PO CAPS
100.0000 mg | ORAL_CAPSULE | Freq: Two times a day (BID) | ORAL | Status: DC
  Administered 2011-12-09 – 2011-12-16 (×14): 100 mg via ORAL
  Filled 2011-12-09 (×15): qty 1

## 2011-12-09 MED ORDER — OXYCODONE HCL 5 MG PO TABS
5.0000 mg | ORAL_TABLET | ORAL | Status: DC | PRN
  Administered 2011-12-15 (×2): 10 mg via ORAL
  Filled 2011-12-09 (×2): qty 2

## 2011-12-09 MED ORDER — MORPHINE SULFATE (PF) 1 MG/ML IV SOLN
INTRAVENOUS | Status: DC
  Administered 2011-12-09: 25 mL via INTRAVENOUS
  Administered 2011-12-10: 4 mg via INTRAVENOUS
  Administered 2011-12-10: 9 mg via INTRAVENOUS
  Administered 2011-12-10: 10.68 mg via INTRAVENOUS
  Administered 2011-12-10: 17.5 mg via INTRAVENOUS
  Administered 2011-12-10: 17:00:00 via INTRAVENOUS
  Filled 2011-12-09 (×4): qty 25

## 2011-12-09 MED ORDER — ACETAMINOPHEN 10 MG/ML IV SOLN
INTRAVENOUS | Status: AC
  Filled 2011-12-09: qty 100

## 2011-12-09 MED ORDER — DIPHENHYDRAMINE HCL 50 MG/ML IJ SOLN: 12.5000 mg | Freq: Four times a day (QID) | INTRAMUSCULAR | Status: DC | PRN

## 2011-12-09 MED ORDER — FENTANYL CITRATE 0.05 MG/ML IJ SOLN
INTRAMUSCULAR | Status: DC | PRN
  Administered 2011-12-09 (×6): 50 ug via INTRAVENOUS

## 2011-12-09 MED ORDER — ACETAMINOPHEN 10 MG/ML IV SOLN
INTRAVENOUS | Status: DC | PRN
  Administered 2011-12-09: 1000 mg via INTRAVENOUS

## 2011-12-09 MED ORDER — NALOXONE HCL 0.4 MG/ML IJ SOLN: 0.4000 mg | INTRAMUSCULAR | Status: DC | PRN

## 2011-12-09 MED ORDER — KCL IN DEXTROSE-NACL 20-5-0.45 MEQ/L-%-% IV SOLN
INTRAVENOUS | Status: DC
  Administered 2011-12-09: 1000 mL via INTRAVENOUS
  Administered 2011-12-10 – 2011-12-11 (×2): via INTRAVENOUS
  Administered 2011-12-12: 50 mL/h via INTRAVENOUS
  Administered 2011-12-15: 07:00:00 via INTRAVENOUS
  Filled 2011-12-09 (×9): qty 1000

## 2011-12-09 MED ORDER — PRESCRIPTION MEDICATION: 1.0000 | Freq: Every day | Status: DC

## 2011-12-09 MED ORDER — ACETAMINOPHEN-CODEINE #4 300-60 MG PO TABS
1.0000 | ORAL_TABLET | ORAL | Status: DC | PRN
  Filled 2011-12-09: qty 2

## 2011-12-09 MED ORDER — ADULT MULTIVITAMIN W/MINERALS CH: 1.0000 | ORAL_TABLET | Freq: Every day | ORAL | Status: DC

## 2011-12-09 MED ORDER — DIPHENHYDRAMINE HCL 12.5 MG/5ML PO ELIX: 12.5000 mg | ORAL_SOLUTION | Freq: Four times a day (QID) | ORAL | Status: DC | PRN

## 2011-12-09 MED ORDER — LIDOCAINE HCL (CARDIAC) 20 MG/ML IV SOLN
INTRAVENOUS | Status: DC | PRN
  Administered 2011-12-09: 100 mg via INTRAVENOUS

## 2011-12-09 MED ORDER — MIDAZOLAM HCL 2 MG/2ML IJ SOLN
INTRAMUSCULAR | Status: AC
  Filled 2011-12-09: qty 2

## 2011-12-09 MED ORDER — HYDROGEN PEROXIDE 3 % EX SOLN
CUTANEOUS | Status: DC | PRN
  Administered 2011-12-09: 1 via TOPICAL

## 2011-12-09 MED ORDER — ALPRAZOLAM 0.5 MG PO TABS: 0.5000 mg | ORAL_TABLET | Freq: Four times a day (QID) | ORAL | Status: DC | PRN

## 2011-12-09 MED ORDER — ONDANSETRON HCL 4 MG/2ML IJ SOLN: 4.0000 mg | Freq: Four times a day (QID) | INTRAMUSCULAR | Status: DC | PRN

## 2011-12-09 MED ORDER — PROMETHAZINE HCL 25 MG/ML IJ SOLN: 6.2500 mg | INTRAMUSCULAR | Status: DC | PRN

## 2011-12-09 MED ORDER — CHLORPROMAZINE HCL 25 MG PO TABS
25.0000 mg | ORAL_TABLET | Freq: Three times a day (TID) | ORAL | Status: DC | PRN
  Administered 2011-12-10: 25 mg via ORAL
  Filled 2011-12-09: qty 1

## 2011-12-09 MED ORDER — SUFENTANIL CITRATE 50 MCG/ML IV SOLN
INTRAVENOUS | Status: DC | PRN
  Administered 2011-12-09: 10 ug via INTRAVENOUS
  Administered 2011-12-09 (×2): 5 ug via INTRAVENOUS
  Administered 2011-12-09: 15 ug via INTRAVENOUS
  Administered 2011-12-09: 10 ug via INTRAVENOUS
  Administered 2011-12-09: 5 ug via INTRAVENOUS

## 2011-12-09 MED ORDER — SIMVASTATIN 20 MG PO TABS
20.0000 mg | ORAL_TABLET | Freq: Every day | ORAL | Status: DC
  Administered 2011-12-10 – 2011-12-15 (×5): 20 mg via ORAL
  Filled 2011-12-09 (×8): qty 1

## 2011-12-09 MED ORDER — SODIUM CHLORIDE 0.9 % IJ SOLN: 9.0000 mL | INTRAMUSCULAR | Status: DC | PRN

## 2011-12-09 MED ORDER — VITAMIN C 500 MG PO TABS: 1000.0000 mg | ORAL_TABLET | Freq: Every day | ORAL | Status: DC

## 2011-12-09 MED ORDER — DIPHENHYDRAMINE HCL 25 MG PO CAPS: 25.0000 mg | ORAL_CAPSULE | Freq: Four times a day (QID) | ORAL | Status: DC | PRN

## 2011-12-09 MED ORDER — METHOCARBAMOL 500 MG PO TABS
500.0000 mg | ORAL_TABLET | Freq: Four times a day (QID) | ORAL | Status: DC | PRN
  Administered 2011-12-10: 500 mg via ORAL
  Filled 2011-12-09: qty 1

## 2011-12-09 MED ORDER — FENTANYL CITRATE 0.05 MG/ML IJ SOLN
INTRAMUSCULAR | Status: AC
  Filled 2011-12-09: qty 2

## 2011-12-09 MED ORDER — PROPOFOL 10 MG/ML IV EMUL
INTRAVENOUS | Status: DC | PRN
  Administered 2011-12-09: 140 mg via INTRAVENOUS

## 2011-12-09 MED ORDER — MIDAZOLAM HCL 5 MG/5ML IJ SOLN
INTRAMUSCULAR | Status: DC | PRN
  Administered 2011-12-09 (×2): 1 mg via INTRAVENOUS

## 2011-12-09 MED ORDER — CRANBERRY EXTRACT 200 MG PO CAPS
1.0000 | ORAL_CAPSULE | Freq: Two times a day (BID) | ORAL | Status: DC
Start: 2011-12-09 — End: 2011-12-09

## 2011-12-09 MED ORDER — HYDROMORPHONE HCL PF 1 MG/ML IJ SOLN: 0.2500 mg | INTRAMUSCULAR | Status: DC | PRN

## 2011-12-09 MED ORDER — ROPIVACAINE HCL 5 MG/ML IJ SOLN
INTRAMUSCULAR | Status: DC | PRN
  Administered 2011-12-09: 30 mL

## 2011-12-09 MED ORDER — LACTATED RINGERS IV SOLN
INTRAVENOUS | Status: DC
  Administered 2011-12-09 (×2): via INTRAVENOUS
  Administered 2011-12-09: 1000 mL via INTRAVENOUS

## 2011-12-09 MED ORDER — B COMPLEX PO TABS: 1.0000 | ORAL_TABLET | Freq: Every day | ORAL | Status: DC

## 2011-12-09 MED ORDER — LACTATED RINGERS IV SOLN: INTRAVENOUS | Status: DC

## 2011-12-09 MED ORDER — OMEGA-3 FATTY ACIDS 1000 MG PO CAPS
1.0000 g | ORAL_CAPSULE | Freq: Two times a day (BID) | ORAL | Status: DC
Start: 2011-12-09 — End: 2011-12-09

## 2011-12-09 MED ORDER — ONDANSETRON HCL 4 MG/2ML IJ SOLN
INTRAMUSCULAR | Status: DC | PRN
  Administered 2011-12-09: 4 mg via INTRAVENOUS

## 2011-12-09 MED ORDER — HYDROCODONE-ACETAMINOPHEN 5-325 MG PO TABS
1.0000 | ORAL_TABLET | ORAL | Status: DC | PRN
  Administered 2011-12-14 – 2011-12-16 (×4): 2 via ORAL
  Filled 2011-12-09 (×4): qty 2

## 2011-12-09 MED ORDER — ENOXAPARIN SODIUM 30 MG/0.3ML ~~LOC~~ SOLN
30.0000 mg | Freq: Two times a day (BID) | SUBCUTANEOUS | Status: DC
  Administered 2011-12-10 – 2011-12-11 (×4): 30 mg via SUBCUTANEOUS
  Filled 2011-12-09 (×6): qty 0.3

## 2011-12-09 MED ORDER — SUCCINYLCHOLINE CHLORIDE 20 MG/ML IJ SOLN
INTRAMUSCULAR | Status: DC | PRN
  Administered 2011-12-09: 80 mg via INTRAVENOUS

## 2011-12-09 MED ORDER — EPHEDRINE SULFATE 50 MG/ML IJ SOLN
INTRAMUSCULAR | Status: DC | PRN
  Administered 2011-12-09 (×2): 10 mg via INTRAVENOUS

## 2011-12-09 MED ORDER — CEFAZOLIN SODIUM-DEXTROSE 2-3 GM-% IV SOLR
INTRAVENOUS | Status: AC
  Filled 2011-12-09: qty 50

## 2011-12-09 MED ORDER — CEFAZOLIN SODIUM 1-5 GM-% IV SOLN: 1.0000 g | INTRAVENOUS | Status: AC

## 2011-12-09 MED ORDER — CEFAZOLIN SODIUM 1-5 GM-% IV SOLN
1.0000 g | Freq: Three times a day (TID) | INTRAVENOUS | Status: DC
  Administered 2011-12-10 – 2011-12-11 (×4): 1 g via INTRAVENOUS
  Filled 2011-12-09 (×8): qty 50

## 2011-12-09 MED ORDER — METHOCARBAMOL 100 MG/ML IJ SOLN
500.0000 mg | Freq: Four times a day (QID) | INTRAVENOUS | Status: DC | PRN
  Administered 2011-12-09: 500 mg via INTRAVENOUS
  Filled 2011-12-09: qty 5

## 2011-12-09 MED ORDER — CEFAZOLIN SODIUM 1-5 GM-% IV SOLN
INTRAVENOUS | Status: DC | PRN
  Administered 2011-12-09: 2 g via INTRAVENOUS

## 2011-12-09 MED ORDER — VITAMIN D3 25 MCG (1000 UNIT) PO TABS
2000.0000 [IU] | ORAL_TABLET | Freq: Two times a day (BID) | ORAL | Status: DC
  Administered 2011-12-09 – 2011-12-15 (×12): 2000 [IU] via ORAL
  Filled 2011-12-09 (×15): qty 2

## 2011-12-09 MED ORDER — ONDANSETRON HCL 4 MG PO TABS: 4.0000 mg | ORAL_TABLET | Freq: Four times a day (QID) | ORAL | Status: DC | PRN

## 2011-12-09 MED ORDER — ZOLPIDEM TARTRATE 5 MG PO TABS: 5.0000 mg | ORAL_TABLET | Freq: Every evening | ORAL | Status: DC | PRN

## 2011-12-09 SURGICAL SUPPLY — 81 items
BAG ZIPLOCK 12X15 (MISCELLANEOUS) ×2 IMPLANT
BANDAGE ACE 4 STERILE (GAUZE/BANDAGES/DRESSINGS) ×2 IMPLANT
BANDAGE ELASTIC 3 VELCRO ST LF (GAUZE/BANDAGES/DRESSINGS) ×2 IMPLANT
BANDAGE ELASTIC 4 VELCRO ST LF (GAUZE/BANDAGES/DRESSINGS) ×2 IMPLANT
BANDAGE GAUZE ELAST BULKY 4 IN (GAUZE/BANDAGES/DRESSINGS) ×2 IMPLANT
BIT DRILL 2.5X2.75 QC CALB (BIT) ×2 IMPLANT
BIT DRILL 2.9 CANN QC NONSTRL (BIT) ×4 IMPLANT
BIT DRILL CALIBRATED 2.7 (BIT) ×2 IMPLANT
BLADE MIC 41X13 (BLADE) ×2 IMPLANT
BLADE SURG 15 STRL LF DISP TIS (BLADE) ×1 IMPLANT
BLADE SURG 15 STRL SS (BLADE) ×1
BLADE SURG SZ10 CARB STEEL (BLADE) ×4 IMPLANT
BNDG COHESIVE 4X5 TAN STRL (GAUZE/BANDAGES/DRESSINGS) ×2 IMPLANT
CANISTER SUCTION 2500CC (MISCELLANEOUS) IMPLANT
CLOTH BEACON ORANGE TIMEOUT ST (SAFETY) ×2 IMPLANT
CORDS BIPOLAR (ELECTRODE) ×4 IMPLANT
CUFF TOURN SGL QUICK 18 (TOURNIQUET CUFF) ×4 IMPLANT
DRAIN PENROSE 18X1/4 LTX STRL (WOUND CARE) ×2 IMPLANT
DRAPE C-ARM 42X72 X-RAY (DRAPES) ×2 IMPLANT
DRAPE LG THREE QUARTER DISP (DRAPES) ×4 IMPLANT
DRAPE OEC MINIVIEW 54X84 (DRAPES) ×2 IMPLANT
DRAPE U-SHAPE 47X51 STRL (DRAPES) ×2 IMPLANT
DRESSING XEROFORM 5X9 (GAUZE/BANDAGES/DRESSINGS) ×2 IMPLANT
DRSG EMULSION OIL 3X3 NADH (GAUZE/BANDAGES/DRESSINGS) ×2 IMPLANT
DRSG PAD ABDOMINAL 8X10 ST (GAUZE/BANDAGES/DRESSINGS) ×2 IMPLANT
ELECT REM PT RETURN 9FT ADLT (ELECTROSURGICAL) ×2
ELECTRODE REM PT RTRN 9FT ADLT (ELECTROSURGICAL) ×1 IMPLANT
GAUZE XEROFORM 5X9 LF (GAUZE/BANDAGES/DRESSINGS) ×2 IMPLANT
GLOVE SURG ORTHO 8.0 STRL STRW (GLOVE) ×2 IMPLANT
GOWN STRL REIN XL XLG (GOWN DISPOSABLE) ×2 IMPLANT
K-WIRE ACE 1.6X6 (WIRE) ×16
KIT BASIN OR (CUSTOM PROCEDURE TRAY) ×2 IMPLANT
KWIRE ACE 1.6X6 (WIRE) ×8 IMPLANT
LOOP VESSEL MAXI BLUE (MISCELLANEOUS) ×2 IMPLANT
MANIFOLD NEPTUNE II (INSTRUMENTS) ×2 IMPLANT
NS IRRIG 1000ML POUR BTL (IV SOLUTION) ×2 IMPLANT
PACK LOWER EXTREMITY WL (CUSTOM PROCEDURE TRAY) ×2 IMPLANT
PAD CAST 3X4 CTTN HI CHSV (CAST SUPPLIES) ×4 IMPLANT
PAD CAST 4YDX4 CTTN HI CHSV (CAST SUPPLIES) ×4 IMPLANT
PADDING CAST COTTON 3X4 STRL (CAST SUPPLIES) ×4
PADDING CAST COTTON 4X4 STRL (CAST SUPPLIES) ×4
PLATE LOCK RT SM (Plate) ×2 IMPLANT
PLATE LOCK RT SM 74X10.7X3.5X9 (Plate) ×1 IMPLANT
PLATE LOCK RT SM 88X10.9X2.5X9 (Plate) ×1 IMPLANT
PLATE OLECRANON SM (Plate) ×2 IMPLANT
POSITIONER SURGICAL ARM (MISCELLANEOUS) ×2 IMPLANT
SCREW  RD HEAD THR 4.0 60 LTH (Screw) ×2 IMPLANT
SCREW CORT 3.5X26 (Screw) ×1 IMPLANT
SCREW CORT T15 26X3.5XST LCK (Screw) ×1 IMPLANT
SCREW CORT T15 28X3.5XST LCK (Screw) ×2 IMPLANT
SCREW CORTICAL 3.5X28MM (Screw) ×2 IMPLANT
SCREW CORTICAL LOW PROF 3.5X20 (Screw) ×8 IMPLANT
SCREW LOCK 3.5X60 DIST TIB (Screw) ×2 IMPLANT
SCREW LOCK CORT STAR 3.5X12 (Screw) ×4 IMPLANT
SCREW LOCK CORT STAR 3.5X14 (Screw) ×8 IMPLANT
SCREW LOCK CORT STAR 3.5X16 (Screw) ×4 IMPLANT
SCREW LOCK CORT STAR 3.5X18 (Screw) ×4 IMPLANT
SCREW LOW PROFILE 18MMX3.5MM (Screw) ×2 IMPLANT
SCREW LOW PROFILE 3.5MMX42 (Screw) ×2 IMPLANT
SCREW LP 3.5 (Screw) ×2 IMPLANT
SCREW NON LOCKING LP 3.5 14MM (Screw) ×2 IMPLANT
SPLINT FIBERGLASS 3X35 (CAST SUPPLIES) ×2 IMPLANT
SPONGE GAUZE 4X4 12PLY (GAUZE/BANDAGES/DRESSINGS) ×4 IMPLANT
SPONGE GAUZE 4X4 FOR O.R. (GAUZE/BANDAGES/DRESSINGS) ×2 IMPLANT
SPONGE LAP 4X18 X RAY DECT (DISPOSABLE) ×2 IMPLANT
SPONGE SURGIFOAM ABS GEL 100 (HEMOSTASIS) ×2 IMPLANT
SUCTION FRAZIER 12FR DISP (SUCTIONS) ×2 IMPLANT
SUT MERSILENE 3 0 FS 1 (SUTURE) IMPLANT
SUT PROLENE 3 0 PS 2 (SUTURE) IMPLANT
SUT VIC AB 0 CT1 27 (SUTURE) ×4
SUT VIC AB 0 CT1 27XBRD ANTBC (SUTURE) ×4 IMPLANT
SUT VIC AB 1 CT1 27 (SUTURE) ×1
SUT VIC AB 1 CT1 27XBRD ANTBC (SUTURE) ×1 IMPLANT
SUT VIC AB 1 CTX 36 (SUTURE)
SUT VIC AB 1 CTX36XBRD ANBCTR (SUTURE) IMPLANT
SUT VIC AB 2-0 CT1 27 (SUTURE) ×5
SUT VIC AB 2-0 CT1 TAPERPNT 27 (SUTURE) ×5 IMPLANT
SUT VIC AB 2-0 CTX 36 (SUTURE) IMPLANT
TOWEL OR 17X26 10 PK STRL BLUE (TOWEL DISPOSABLE) ×4 IMPLANT
WASHER 3.5MM (Orthopedic Implant) ×6 IMPLANT
WATER STERILE IRR 1500ML POUR (IV SOLUTION) IMPLANT

## 2011-12-09 NOTE — Anesthesia Postprocedure Evaluation (Signed)
  Anesthesia Post-op Note  Patient: [name]  Procedure(s) Performed:  OPEN REDUCTION INTERNAL FIXATION (ORIF) DISTAL HUMERUS FRACTURE  Patient Location: PACU  Anesthesia Type: GA combined with regional for post-op pain  Level of Consciousness: awake and alert   Airway and Oxygen Therapy: Patient Spontanous Breathing  Post-op Pain: mild  Post-op Assessment: Post-op Vital signs reviewed, Patient's Cardiovascular Status Stable, Respiratory Function Stable, Patent Airway and No signs of Nausea or vomiting  Post-op Vital Signs: stable  Complications: No apparent anesthesia complications

## 2011-12-09 NOTE — Anesthesia Procedure Notes (Addendum)
Anesthesia Regional Block:  Supraclavicular block  Pre-Anesthetic Checklist: ,, timeout performed, Correct Patient, Correct Site, Correct Laterality, Correct Procedure, Correct Position, site marked, Risks and benefits discussed,  Surgical consent,  Pre-op evaluation,  At surgeon's request and post-op pain management  Laterality: Right  Prep: chloraprep       Needles:  Injection technique: Single-shot  Needle Type: Stimiplex          Additional Needles:  Procedures: ultrasound guided Supraclavicular block Narrative:  Start time: 12/09/2011 11:35 AM End time: 12/09/2011 11:46 AM Injection made incrementally with aspirations every 5 mL.  Performed by: Personally  Anesthesiologist: Ronelle Nigh MD  Additional Notes: Risks, benefits and alternative to block explained extensively.  Patient tolerated procedure well, without complications.  Supraclavicular block

## 2011-12-09 NOTE — Progress Notes (Signed)
PHARMACIST - PHYSICIAN ORDER COMMUNICATION ° °CONCERNING: P&T Medication Policy on Herbal Medications ° °DESCRIPTION:  This patient’s order for:  Cranberry extract  has been noted. ° °This product(s) is classified as an “herbal” or natural product. °Due to a lack of definitive safety studies or FDA approval, nonstandard manufacturing practices, plus the potential risk of unknown drug-drug interactions while on inpatient medications, the Pharmacy and Therapeutics Committee does not permit the use of “herbal” or natural products of this type within [place]. °  °ACTION TAKEN: °The pharmacy department is unable to verify this order at this time and your patient has been informed of this safety policy. °Please reevaluate patient’s clinical condition at discharge and address if the herbal or natural product(s) should be resumed at that time. ° °

## 2011-12-09 NOTE — Transfer of Care (Signed)
Immediate Anesthesia Transfer of Care Note  Patient: [name]  Procedure(s) Performed:  OPEN REDUCTION INTERNAL FIXATION (ORIF) DISTAL HUMERUS FRACTURE  Patient Location: PACU  Anesthesia Type: GA combined with regional for post-op pain  Level of Consciousness: awake, alert  and patient cooperative  Airway & Oxygen Therapy: Patient Spontanous Breathing and Patient connected to face mask oxygen  Post-op Assessment: Report given to PACU RN and Post -op Vital signs reviewed and stable  Post vital signs: Reviewed and stable Filed Vitals:   12/09/11 1145  BP:   Pulse: 74  Temp:   Resp: 15    Complications: No apparent anesthesia complications

## 2011-12-09 NOTE — Brief Op Note (Signed)
12/07/2011 - 12/09/2011  5:46 PM  PATIENT:  [name]  78 y.o. female  PRE-OPERATIVE DIAGNOSIS:  fracture distal right humerus  POST-OPERATIVE DIAGNOSIS:  fracture distal right humerus  PROCEDURE:  Procedure(s): OPEN REDUCTION INTERNAL FIXATION (ORIF) DISTAL HUMERUS FRACTURE  SURGEON:  Surgeon(s): Sharma Covert, MD Drucilla Schmidt, MD  PHYSICIAN ASSISTANT: none  ASSISTANTS: none   ANESTHESIA:   general  EBL:  Total I/O In: 2000 [I.V.:2000] Out: 1175 [Urine:1050; Blood:125]  BLOOD ADMINISTERED:none  DRAINS: none   LOCAL MEDICATIONS USED:  NONE  SPECIMEN:  No Specimen  DISPOSITION OF SPECIMEN:  N/A  COUNTS:  YES  TOURNIQUET:   Total Tourniquet Time Documented: Upper Arm (Right) - 121 minutes  DICTATION: .Note written in EPIC and Other Dictation: Dictation Number [id number]  PLAN OF CARE: Admit to inpatient   PATIENT DISPOSITION:  PACU - hemodynamically stable.   Delay start of Pharmacological VTE agent (>24hrs) due to surgical blood loss or risk of bleeding:  {YES/NO/NOT APPLICABLE:20182

## 2011-12-09 NOTE — Preoperative (Signed)
Beta Blockers   Reason not to administer Beta Blockers:Not Applicable 

## 2011-12-09 NOTE — H&P (Signed)
PT SEEN/EXAMINED IN THE HOLDING AREA R/B/A DISCUSSED WITH PT IN OFFICE AND IN HOSPITAL PT VOICED UNDERSTANDING OF PLAN CONSENT SIGNED DAY OF SURGERY PT SEEN AND EXAMINED PRIOR TO OPERATIVE PROCEDURE/DAY OF SURGERY SITE MARKED. QUESTIONS ANSWERED WILL STAY AS INPATIENT FOLLOWING SURGERY

## 2011-12-10 LAB — GLUCOSE, CAPILLARY

## 2011-12-10 MED ORDER — DIPHENHYDRAMINE HCL 12.5 MG/5ML PO ELIX: 12.5000 mg | ORAL_SOLUTION | Freq: Four times a day (QID) | ORAL | Status: DC | PRN

## 2011-12-10 MED ORDER — SODIUM CHLORIDE 0.9 % IJ SOLN: 9.0000 mL | INTRAMUSCULAR | Status: DC | PRN

## 2011-12-10 MED ORDER — ONDANSETRON HCL 4 MG/2ML IJ SOLN: 4.0000 mg | Freq: Four times a day (QID) | INTRAMUSCULAR | Status: DC | PRN

## 2011-12-10 MED ORDER — HYDROMORPHONE 0.3 MG/ML IV SOLN
INTRAVENOUS | Status: DC
  Administered 2011-12-10: 1.2 mg via INTRAVENOUS
  Administered 2011-12-10: 0.3 mg via INTRAVENOUS
  Administered 2011-12-11: 13:00:00 via INTRAVENOUS
  Administered 2011-12-11: 2.4 mg via INTRAVENOUS
  Administered 2011-12-11: 1.5 mg via INTRAVENOUS
  Administered 2011-12-11 (×2): 0.6 mg via INTRAVENOUS
  Administered 2011-12-11: 10 mg via INTRAVENOUS
  Administered 2011-12-12: 8.16 mL via INTRAVENOUS
  Administered 2011-12-12: 1.2 mL via INTRAVENOUS
  Administered 2011-12-12: 0 mg via INTRAVENOUS
  Filled 2011-12-10 (×3): qty 25

## 2011-12-10 MED ORDER — DIPHENHYDRAMINE HCL 50 MG/ML IJ SOLN: 12.5000 mg | Freq: Four times a day (QID) | INTRAMUSCULAR | Status: DC | PRN

## 2011-12-10 MED ORDER — NALOXONE HCL 0.4 MG/ML IJ SOLN: 0.4000 mg | INTRAMUSCULAR | Status: DC | PRN

## 2011-12-10 NOTE — Progress Notes (Signed)
Physical Therapy Evaluation Patient Details Name: [name] MRN: [id number] DOB: [birth year] Today's Date: 12/10/2011 1120-1200 Ev2  Problem List: There is no problem list on file for this patient.   Past Medical History:  Past Medical History  Diagnosis Date  . Osteoporosis   . Hyperlipemia   . Diabetes mellitus     no mes, diet only    Past Surgical History:  Past Surgical History  Procedure Date  . Tonsillectomy   . Left knee arthroscopy   . Other surgical history     left small toe bone spur removed     PT Assessment/Plan/Recommendation PT Assessment Clinical Impression Statement: Patient presents s/p ORIF right distal humeral fracture with decreased independence with mobility, acute pain, poor activity tolerance, decreased balance and decreased AROM and strength right UE.  Will benefit from skilled PT to maximize independence with mobility and allow return to independent after SNF stay. PT Recommendation/Assessment: Patient will need skilled PT in the acute care venue PT Problem List: Decreased strength;Decreased range of motion;Decreased activity tolerance;Decreased balance;Decreased mobility;Pain Barriers to Discharge: Decreased caregiver support PT Therapy Diagnosis : Difficulty walking;Acute pain PT Plan PT Frequency: Min 3X/week PT Treatment/Interventions: Gait training;Functional mobility training;Therapeutic activities;Therapeutic exercise;Balance training;Patient/family education;DME instruction PT Recommendation Follow Up Recommendations: Skilled nursing facility Equipment Recommended: Defer to next venue PT Goals  Acute Rehab PT Goals PT Goal Formulation: With patient Time For Goal Achievement: 7 days Pt will go Supine/Side to Sit: with supervision PT Goal: Supine/Side to Sit - Progress: Goal set today Pt will go Sit to Supine/Side: with supervision PT Goal: Sit to Supine/Side - Progress: Goal set today Pt will go Sit to Stand: with supervision PT  Goal: Sit to Stand - Progress: Goal set today Pt will go Stand to Sit: with supervision PT Goal: Stand to Sit - Progress: Goal set today Pt will Ambulate: 51 - 150 feet;with least restrictive assistive device;with min assist PT Goal: Ambulate - Progress: Goal set today  PT Evaluation Precautions/Restrictions  Precautions Precautions: Fall Restrictions Weight Bearing Restrictions: Yes RUE Weight Bearing: Non weight bearing Other Position/Activity Restrictions: Elevate with 3 pillows, ice Prior Functioning  Home Living Lives With: Alone;Other (Comment) (staying with a friend.) Type of Home: House Home Layout: One level Home Access: Stairs to enter Entrance Stairs-Rails: Lawyer of Steps: 3 Bathroom Shower/Tub: Health visitor: Handicapped height Home Adaptive Equipment: Built-in shower seat;Grab bars in shower;Grab bars around toilet Prior Function Level of Independence: Independent with basic ADLs;Independent with transfers;Needs assistance with gait;Independent with homemaking with ambulation Driving: Yes 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 Arousal/Alertness: Awake/alert Overall Cognitive Status: Appears within functional limits for tasks assessed Orientation Level: Oriented X4 Sensation/Coordination Sensation Light Touch: Appears Intact HExtremity Assessment RLE Assessment RLE Assessment: Within Functional Limits LLE Assessment LLE Assessment: Within Functional Limits Mobility (including Balance) Bed Mobility Bed Mobility: Yes Supine to Sit: 4: Min assist Supine to Sit Details (indicate cue type and reason): with overhead trapeze Sitting - Scoot to Edge of Bed: 4: Min assist Sitting - Scoot to [place] of Bed Details (indicate cue type and reason): for support under right arm Transfers Sit to Stand: 4: Min assist;With upper extremity assist;From bed Sit to Stand Details (indicate cue type and reason): RUE supported  throughout. Stand to Sit: 4: Min assist;With upper extremity assist;With armrests;To chair/3-in-1 Stand to Sit Details: cue to reach back for chair with left UE Stand Pivot Transfers: 3: Mod assist Stand Pivot Transfer Details (indicate cue type and reason): for  safety with helping to hold right arm and balance to step to chair    Exercise    End of Session PT - End of Session Equipment Utilized During Treatment: Gait belt Activity Tolerance: Patient limited by pain Patient left: in chair;with call bell in reach General Behavior During Session: [facility] for tasks performed Cognition: [facility] for tasks performed  [facility] 12/10/2011, 12:27 PM

## 2011-12-10 NOTE — Progress Notes (Signed)
Physical Therapy Treatment Patient Details Name: ZOIE SARIN MRN: [id number] DOB: May 21, 1932 Today's Date: 12/10/2011 1230-1240 1TA  PT Assessment/Plan  PT - Assessment/Plan Comments on Treatment Session: Patient uncomfortable in chair so after about 30 minutes assisted her back to bed at her request.  Positioned right arm on pillows as able for comfort.  Utilizing PCA for pain control PT Plan: Discharge plan remains appropriate PT Frequency: Min 3X/week Follow Up Recommendations: Skilled nursing facility Equipment Recommended: Defer to next venue PT Goals  Acute Rehab PT Goals Pt will go Sit to Supine/Side: with supervision PT Goal: Sit to Supine/Side - Progress: Progressing toward goal Pt will go Sit to Stand: with supervision PT Goal: Sit to Stand - Progress: Progressing toward goal Pt will go Stand to Sit: with supervision PT Goal: Stand to Sit - Progress: Progressing toward goal  PT Treatment Precautions/Restrictions  Precautions Precautions: Fall Restrictions Weight Bearing Restrictions: Yes RUE Weight Bearing: Non weight bearing Other Position/Activity Restrictions: Elevate with 3 pillows, ice Mobility (including Balance) Bed Mobility Sit to Supine: 4: Min assist Sit to Supine - Details (indicate cue type and reason): supporting right arm while patient lifted legs in bed Transfers Sit to Stand: 4: Min assist;With upper extremity assist;From chair/3-in-1 Sit to Stand Details (indicate cue type and reason): cues for lifting with legs Stand to Sit: 4: Min assist;With upper extremity assist;To bed Stand to Sit Details: while supporting arm Stand Pivot Transfers: 3: Mod assist Stand Pivot Transfer Details (indicate cue type and reason): chair to bed while supporting right arm    Exercise    End of Session PT - End of Session Activity Tolerance: Patient limited by pain Patient left: in bed;with call bell in reach General Behavior During Session: [facility] for tasks  performed Cognition: [facility] for tasks performed  [facility] 12/10/2011, 5:18 PM

## 2011-12-10 NOTE — Progress Notes (Signed)
Subjective: 1 Day Post-Op Procedure(s) (LRB): OPEN REDUCTION INTERNAL FIXATION (ORIF) DISTAL HUMERUS FRACTURE (Right) Patient reports pain as 8 on 0-10 scale.   Denies CP or SOB.  Positive flatus.  She states block just wore off this am. Seen in rounds with Shon Baton Objective: Vital signs in last 24 hours: Temp:  [97 F (36.1 C)-102.3 F (39.1 C)] 99.1 F (37.3 C) (01/19 0833) Pulse Rate:  [71-111] 102  (01/19 0442) Resp:  [12-19] 14  (01/19 0858) BP: (118-159)/(53-81) 118/69 mmHg (01/19 0442) SpO2:  [90 %-100 %] 93 % (01/19 0858)  Intake/Output from previous day: 01/18 0701 - 01/19 0700 In: 3345 [P.O.:240; I.V.:3000; IV Piggyback:105] Out: 3275 [Urine:3150; Blood:125] Intake/Output this shift:    No results found for this basename: HGB:5 in the last 72 hours No results found for this basename: WBC:2,RBC:2,HCT:2,PLT:2 in the last 72 hours No results found for this basename: NA:2,K:2,CL:2,CO2:2,BUN:2,CREATININE:2,GLUCOSE:2,CALCIUM:2 in the last 72 hours No results found for this basename: LABPT:2,INR:2 in the last 72 hours  Neurologically intact Neurovascular intact Sensation intact distally Dorsiflexion/Plantar flexion intact Compartment soft  Assessment/Plan: 1 Day Post-Op Procedure(s) (LRB): OPEN REDUCTION INTERNAL FIXATION (ORIF) DISTAL HUMERUS FRACTURE (Right) Cont current care Cont elevation Pain control issues Encouraged to move fingers and wrist  Jamae Tison R. 12/10/2011, 9:13 AM

## 2011-12-10 NOTE — Progress Notes (Signed)
Occupational Therapy Evaluation Patient Details Name: [name] MRN: [id number] DOB: 1932-06-13 Today's Date: 12/10/2011  Problem List: There is no problem list on file for this patient. EV2 1130-1200  Past Medical History:  Past Medical History  Diagnosis Date  . Osteoporosis   . Hyperlipemia   . Diabetes mellitus     no mes, diet only    Past Surgical History:  Past Surgical History  Procedure Date  . Tonsillectomy   . Left knee arthroscopy   . Other surgical history     left small toe bone spur removed     OT Assessment/Plan/Recommendation OT Assessment Clinical Impression Statement: Pt presents with a decline in BADL performance s/p R distal humeral ORIF. Skilled OT recommended to maximize I level to supervision in prep for d/c to next venue of care. OT Recommendation/Assessment: Patient will need skilled OT in the acute care venue OT Problem List: Decreased activity tolerance;Decreased strength;Decreased range of motion;Decreased coordination;Decreased knowledge of use of DME or AE;Cardiopulmonary status limiting activity;Increased edema;Pain;Impaired UE functional use Barriers to Discharge: Inaccessible home environment;Decreased caregiver support OT Therapy Diagnosis : Generalized weakness;Acute pain OT Plan OT Frequency: Min 2X/week OT Treatment/Interventions: Self-care/ADL training;Therapeutic activities;DME and/or AE instruction;Patient/family education OT Recommendation Follow Up Recommendations: Skilled nursing facility Equipment Recommended: Defer to next venue Individuals Consulted Consulted and Agree with Results and Recommendations: Patient OT Goals Acute Rehab OT Goals OT Goal Formulation: With patient ADL Goals Pt Will Perform Grooming: with supervision;Standing at sink (X 3 tasks to improve standing activity tolerance.) ADL Goal: Grooming - Progress: Goal set today Pt Will Transfer to Toilet: with supervision;3-in-1;Ambulation ADL Goal: Toilet  Transfer - Progress: Goal set today Pt Will Perform Toileting - Clothing Manipulation: with supervision;Standing ADL Goal: Toileting - Clothing Manipulation - Progress: Goal set today Pt Will Perform Toileting - Hygiene: with supervision;Sit to stand from 3-in-1/toilet ADL Goal: Toileting - Hygiene - Progress: Goal set today Arm Goals Pt Will Perform AROM: with supervision, verbal cues required/provided;Other (comment);1 set;10 reps;to maintain range of motion (All fingers and thumb of R hand.) Arm Goal: AROM - Progress: Goal set today  OT Evaluation Precautions/Restrictions  Restrictions Weight Bearing Restrictions: Yes RUE Weight Bearing: Non weight bearing Other Position/Activity Restrictions: Elevate with 3 pillows, ice Prior Functioning Home Living Lives With: Alone;Other (Comment) (staying with a friend.) Type of Home: House Home Layout: One level Home Access: Stairs to enter Entrance Stairs-Rails: Lawyer of Steps: 3 Bathroom Shower/Tub: Health visitor: Handicapped height Home Adaptive Equipment: Built-in shower seat;Grab bars in shower;Grab bars around toilet Prior Function Level of Independence: Independent with basic ADLs;Independent with transfers;Needs assistance with gait;Independent with homemaking with ambulation Driving: Yes Vocation: Volunteer work ADL ADL Eating/Feeding: Set up Where Assessed - Eating/Feeding: Chair Grooming: Simulated;Minimal assistance Where Assessed - Grooming: Sitting, bed;Unsupported Upper Body Bathing: Simulated;Moderate assistance Where Assessed - Upper Body Bathing: Unsupported;Sitting, bed Lower Body Bathing: Simulated;Moderate assistance Where Assessed - Lower Body Bathing: Sit to stand from bed Upper Body Dressing: Simulated;Moderate assistance Where Assessed - Upper Body Dressing: Sitting, bed;Unsupported Lower Body Dressing: Simulated;Moderate assistance Where Assessed - Lower Body  Dressing: Sitting, bed;Unsupported Toilet Transfer: Simulated;Minimal assistance;Other (comment) (PT present, supported pt's arm throughout transfer.) Toilet Transfer Method: Stand pivot Toileting - Clothing Manipulation: Simulated;Minimal assistance Where Assessed - Toileting Clothing Manipulation: Standing Toileting - Hygiene: Simulated;Minimal assistance Where Assessed - Toileting Hygiene: Standing Tub/Shower Transfer: Not assessed Tub/Shower Transfer Method: Not assessed ADL Comments: Eval limited by pt pain, fatigue. BP 92/52 following transfer to  chair. Pt agreeable to sit up for 30 min. Vision/Perception  Vision - History Baseline Vision: Wears glasses all the time Patient Visual Report: No change from baseline Vision - Assessment Vision Assessment: Vision not tested Cognition Cognition Arousal/Alertness: Awake/alert Overall Cognitive Status: Appears within functional limits for tasks assessed Orientation Level: Oriented X4 Sensation/Coordination Sensation Light Touch: Appears Intact Hot/Cold: Appears Intact Proprioception: Appears Intact Additional Comments: Pt denies numbness or tingling in fingers. Only able to wiggle digits 1-3. Unable to move wrist, pinky or thumb. Coordination Gross Motor Movements are Fluid and Coordinated: Yes Fine Motor Movements are Fluid and Coordinated: No Extremity Assessment RUE Assessment RUE Assessment: Not tested (due to pain.) LUE Assessment LUE Assessment: Within Functional Limits Mobility  Bed Mobility Bed Mobility: Yes Supine to Sit: 4: Min assist;With rails;HOB elevated (Comment degrees) Supine to Sit Details (indicate cue type and reason): using trapeze with LUE Sitting - Scoot to Edge of Bed: 4: Min assist Transfers Transfers: Yes Sit to Stand: 4: Min assist;From bed;With upper extremity assist Sit to Stand Details (indicate cue type and reason): RUE supported throughout. Stand to Sit: 4: Min assist;With upper extremity  assist;To chair/3-in-1 Stand to Sit Details: RUE supported throughout. Exercises   End of Session OT - End of Session Activity Tolerance: Patient limited by fatigue;Patient limited by pain Patient left: in chair;with call bell in reach General Behavior During Session: [facility] for tasks performed Cognition: [facility] for tasks performed   Aliz Meritt A, OTR/L [phone] 12/10/2011, 12:15 PM

## 2011-12-11 ENCOUNTER — Encounter (HOSPITAL_COMMUNITY): Payer: Self-pay | Admitting: Cardiology

## 2011-12-11 ENCOUNTER — Inpatient Hospital Stay (HOSPITAL_COMMUNITY): Payer: Medicare Other

## 2011-12-11 ENCOUNTER — Other Ambulatory Visit: Payer: Self-pay

## 2011-12-11 DIAGNOSIS — I959 Hypotension, unspecified: Secondary | ICD-10-CM | POA: Diagnosis not present

## 2011-12-11 DIAGNOSIS — R000 Tachycardia, unspecified: Secondary | ICD-10-CM | POA: Diagnosis not present

## 2011-12-11 DIAGNOSIS — I472 Ventricular tachycardia: Secondary | ICD-10-CM | POA: Diagnosis not present

## 2011-12-11 DIAGNOSIS — R0902 Hypoxemia: Secondary | ICD-10-CM | POA: Diagnosis not present

## 2011-12-11 DIAGNOSIS — S42309A Unspecified fracture of shaft of humerus, unspecified arm, initial encounter for closed fracture: Secondary | ICD-10-CM | POA: Diagnosis present

## 2011-12-11 DIAGNOSIS — S42202A Unspecified fracture of upper end of left humerus, initial encounter for closed fracture: Secondary | ICD-10-CM | POA: Diagnosis present

## 2011-12-11 LAB — BASIC METABOLIC PANEL
BUN: 11 mg/dL (ref 6–23)
Chloride: 97 mEq/L (ref 96–112)
GFR calc non Af Amer: 64 mL/min — ABNORMAL LOW (ref >90)
Glucose, Bld: 114 mg/dL — ABNORMAL HIGH (ref 70–99)
Potassium: 4 mEq/L (ref 3.5–5.1)
Sodium: 133 mEq/L — ABNORMAL LOW (ref 135–145)

## 2011-12-11 LAB — DIFFERENTIAL
Eosinophils Absolute: 0.2 10*3/uL (ref 0.0–0.7)
Lymphs Abs: 2.1 10*3/uL (ref 0.7–4.0)
Monocytes Absolute: 1 10*3/uL (ref 0.1–1.0)
Monocytes Relative: 10 % (ref 3–12)
Neutro Abs: 6.1 10*3/uL (ref 1.7–7.7)
Neutrophils Relative %: 65 % (ref 43–77)

## 2011-12-11 LAB — CBC
HCT: 28.5 % — ABNORMAL LOW (ref 36.0–46.0)
Hemoglobin: 9.5 g/dL — ABNORMAL LOW (ref 12.0–15.0)
MCH: 29.8 pg (ref 26.0–34.0)
MCHC: 33.3 g/dL (ref 30.0–36.0)
RBC: 3.19 MIL/uL — ABNORMAL LOW (ref 3.87–5.11)

## 2011-12-11 LAB — URINALYSIS, ROUTINE W REFLEX MICROSCOPIC
Bilirubin Urine: NEGATIVE
Hgb urine dipstick: NEGATIVE
Nitrite: NEGATIVE
Protein, ur: NEGATIVE mg/dL
Urobilinogen, UA: 0.2 mg/dL (ref 0.0–1.0)

## 2011-12-11 LAB — PROTIME-INR
INR: 1.15 (ref 0.00–1.49)
Prothrombin Time: 14.9 seconds (ref 11.6–15.2)

## 2011-12-11 LAB — CARDIAC PANEL(CRET KIN+CKTOT+MB+TROPI)
CK, MB: 2.8 ng/mL (ref 0.3–4.0)
Troponin I: <0.3 ng/mL (ref <0.30)

## 2011-12-11 MED ORDER — METOPROLOL TARTRATE 25 MG PO TABS
25.0000 mg | ORAL_TABLET | Freq: Two times a day (BID) | ORAL | Status: DC
  Administered 2011-12-11 – 2011-12-16 (×10): 25 mg via ORAL
  Filled 2011-12-11 (×11): qty 1

## 2011-12-11 MED ORDER — ASPIRIN 325 MG PO TABS
325.0000 mg | ORAL_TABLET | Freq: Once | ORAL | Status: AC
  Administered 2011-12-11: 325 mg via ORAL
  Filled 2011-12-11: qty 1

## 2011-12-11 MED ORDER — IOHEXOL 300 MG/ML  SOLN
100.0000 mL | Freq: Once | INTRAMUSCULAR | Status: AC | PRN
  Administered 2011-12-11: 100 mL via INTRAVENOUS

## 2011-12-11 MED ORDER — MAGIC MOUTHWASH
5.0000 mL | Freq: Four times a day (QID) | ORAL | Status: DC
  Administered 2011-12-11 – 2011-12-15 (×13): 5 mL via ORAL
  Filled 2011-12-11 (×23): qty 5

## 2011-12-11 NOTE — Consult Note (Signed)
Reason for Consult: Tachycardia  Referring Physician: [facility]   [name] is an 78 y.o. female.    Chief Complaint:  Pt. Without complaints found to be tachycardic with HR 178.  BP was 77/54.  HPI: [name], civil (consulting), underwent ORIF of the distal humerus fracture that occurred one week ago after she slipped on the ice. She stated it was an accidental fall it was no syncope involved. Tonight we were asked to consult as patient was found to be in a rapid tachycardia of 178 beats per minute the systolic blood pressure of 77. She was given IV fluid bolus a 500 cc and then she converted spontaneously to sinus rhythm sinus tach. Her blood pressure returned to 120-130 systolic.  She had no awareness of rapid heartbeat. She denied chest pain or shortness of breath. Currently she is stable without complaints except for right arm pain at the surgery site.  The EKG after conversion to sinus rhythm reveals mild ST depression in leads V2 through V6.  She denies any history of tachycardia in the past but again she was not aware of this tachycardia.  She has no cardiac history that she is aware of.  She does have a history of hyper thyroid and hypo-thyroid many years ago.  She is diabetic. Her mother had a heart attack in her 61s her father died with CVA there is no coronary disease in her siblings.  She denies any recent shortness of breath with exertion.  She does admit to postural hypotension with syncope which began when she was in the first grade. This is treated by not standing still very long.  Past Medical History  Diagnosis Date  . Osteoporosis   . Hyperlipemia   . Diabetes mellitus     no mes, diet only     Past Surgical History  Procedure Date  . Tonsillectomy   . Left knee arthroscopy   . Other surgical history     left small toe bone spur removed     History reviewed. No pertinent family history. see above for coronary disease in the family Social History:   reports that she quit smoking about 1 years ago. She has never used smokeless tobacco. She reports that she does not drink alcohol or use illicit drugs. she is divorced with 2 children. She states she is quite active.  Allergies:  Allergies  Allergen Reactions  . Sulfa Antibiotics Hives, Diarrhea and Nausea And Vomiting    Facial swelling    Medications Prior to Admission  Medication Dose Route Frequency Provider Last Rate Last Dose  . acetaminophen-codeine (TYLENOL #4) 300-60 MG per tablet 1-2 tablet  1-2 tablet Oral Q4H PRN Sharma Covert, MD      . ALPRAZolam Prudy Feeler) tablet 0.5 mg  0.5 mg Oral Q6H PRN Sharma Covert, MD      . aspirin tablet 325 mg  325 mg Oral Once Anderson Malta, DO   325 mg at 12/11/11 2021  . B-complex with vitamin C tablet 1 tablet  1 tablet Oral Daily Sharma Covert, MD   1 tablet at 12/11/11 1040  . ceFAZolin (ANCEF) IVPB 1 g/50 mL premix  1 g Intravenous NOW Sharma Covert, MD      . chlorproMAZINE (THORAZINE) tablet 25 mg  25 mg Oral Q8H PRN Sharma Covert, MD   25 mg at 12/10/11 2154  . cholecalciferol (VITAMIN D) tablet 2,000 Units  2,000 Units Oral BID Sharma Covert, MD  2,000 Units at 12/11/11 1040  . dextrose 5 % and 0.45 % NaCl with KCl 20 mEq/L infusion   Intravenous Continuous Sharma Covert, MD 50 mL/hr at 12/11/11 1553    . diphenhydrAMINE (BENADRYL) injection 12.5 mg  12.5 mg Intravenous Q6H PRN Sharma Covert, MD       Or  . diphenhydrAMINE (BENADRYL) 12.5 MG/5ML elixir 12.5 mg  12.5 mg Oral Q6H PRN Sharma Covert, MD      . diphenhydrAMINE (BENADRYL) injection 12.5 mg  12.5 mg Intravenous Q6H PRN Liam Graham, PA       Or  . diphenhydrAMINE (BENADRYL) 12.5 MG/5ML elixir 12.5 mg  12.5 mg Oral Q6H PRN Liam Graham, PA      . diphenhydrAMINE (BENADRYL) capsule 25-50 mg  25-50 mg Oral Q6H PRN Sharma Covert, MD      . docusate sodium (COLACE) capsule 100 mg  100 mg Oral BID Sharma Covert, MD   100 mg at 12/11/11 1039  . enoxaparin  (LOVENOX) injection 30 mg  30 mg Subcutaneous Q12H Sharma Covert, MD   30 mg at 12/11/11 1040  . HYDROcodone-acetaminophen (NORCO) 5-325 MG per tablet 1-2 tablet  1-2 tablet Oral Q4H PRN Liam Graham, PA      . hydrogen peroxide 3 % external solution    PRN Sharma Covert, MD   1 application at 12/09/11 1804  . HYDROmorphone (DILAUDID) injection 0.25-0.5 mg  0.25-0.5 mg Intravenous Q5 min PRN Gaetano Hawthorne, MD      . HYDROmorphone (DILAUDID) injection 0.5-1 mg  0.5-1 mg Intravenous Q2H PRN Sharma Covert, MD   1 mg at 12/09/11 1901  . HYDROmorphone (DILAUDID) PCA injection 0.3 mg/mL   Intravenous Q4H Liam Graham, PA   10 mg at 12/11/11 1600  . magic mouthwash  5 mL Oral QID Drucilla Schmidt, MD   5 mL at 12/11/11 1500  . methocarbamol (ROBAXIN) tablet 500 mg  500 mg Oral Q6H PRN Sharma Covert, MD   500 mg at 12/10/11 1516   Or  . methocarbamol (ROBAXIN) 500 mg in dextrose 5 % 50 mL IVPB  500 mg Intravenous Q6H PRN Sharma Covert, MD   500 mg at 12/09/11 1943  . metoprolol tartrate (LOPRESSOR) tablet 25 mg  25 mg Oral BID Anderson Malta, DO   25 mg at 12/11/11 2021  . Mirabegron ER TB24 25 mg  25 mg Oral QHS Sharma Covert, MD   25 mg at 12/10/11 2154  . mulitivitamin with minerals tablet 1 tablet  1 tablet Oral Daily Sharma Covert, MD   1 tablet at 12/11/11 1039  . naloxone [facility]) injection 0.4 mg  0.4 mg Intravenous PRN Sharma Covert, MD       And  . sodium chloride 0.9 % injection 9 mL  9 mL Intravenous PRN Sharma Covert, MD      . naloxone [facility]) injection 0.4 mg  0.4 mg Intravenous PRN Liam Graham, PA       And  . sodium chloride 0.9 % injection 9 mL  9 mL Intravenous PRN Liam Graham, PA      . omega-3 acid ethyl esters (LOVAZA) capsule 1 g  1 g Oral BID Sharma Covert, MD   1 g at 12/11/11 1040  . ondansetron (ZOFRAN) tablet 4 mg  4 mg Oral Q6H PRN Sharma Covert, MD       Or  .  ondansetron (ZOFRAN) injection 4 mg  4 mg Intravenous Q6H PRN  Sharma Covert, MD      . ondansetron [facility]) injection 4 mg  4 mg Intravenous Q6H PRN Sharma Covert, MD      . ondansetron [facility]) injection 4 mg  4 mg Intravenous Q6H PRN Liam Graham, PA      . oxyCODONE (Oxy IR/ROXICODONE) immediate release tablet 5-10 mg  5-10 mg Oral Q3H PRN Liam Graham, PA      . simvastatin (ZOCOR) tablet 20 mg  20 mg Oral q1800 Sharma Covert, MD   20 mg at 12/10/11 1844  . vitamin C (ASCORBIC ACID) tablet 1,000 mg  1,000 mg Oral Daily Sharma Covert, MD   1,000 mg at 12/11/11 1039  . zolpidem (AMBIEN) tablet 5 mg  5 mg Oral QHS PRN Sharma Covert, MD      . DISCONTD: acetaminophen-codeine (TYLENOL #4) 300-60 MG per tablet 1 tablet  1 tablet Oral Q4H PRN Sharma Covert, MD      . DISCONTD: b complex vitamins tablet 1 tablet  1 tablet Oral Daily Sharma Covert, MD      . DISCONTD: ceFAZolin (ANCEF) IVPB 1 g/50 mL premix  1 g Intravenous Q8H Sharma Covert, MD   1 g at 12/11/11 0117  . DISCONTD: ceFAZolin (ANCEF) IVPB 2 g/50 mL premix  2 g Intravenous 60 min Pre-Op Sharma Covert, MD      . DISCONTD: chlorhexidine (HIBICLENS) 4 % liquid 4 application  60 mL Topical Once Sharma Covert, MD      . DISCONTD: cholecalciferol (VITAMIN D) tablet 1,000 Units  1,000 Units Oral BID Sharma Covert, MD   1,000 Units at 12/08/11 2229  . DISCONTD: Cranberry Extract CAPS 200 mg  1 capsule Oral BID Sharma Covert, MD      . DISCONTD: dextrose 5 % and 0.45 % NaCl with KCl 20 mEq/L infusion   Intravenous Continuous Sharma Covert, MD 20 mL/hr at 12/07/11 1700 20 mL at 12/07/11 1700  . DISCONTD: diphenhydrAMINE (BENADRYL) capsule 25-50 mg  25-50 mg Oral Q6H PRN Sharma Covert, MD      . DISCONTD: docusate sodium (COLACE) capsule 100 mg  100 mg Oral BID Sharma Covert, MD   100 mg at 12/08/11 2229  . DISCONTD: fish oil-omega-3 fatty acids capsule 1 g  1 g Oral BID Sharma Covert, MD      . DISCONTD: HYDROcodone-acetaminophen [facility]) 5-325 MG per tablet 1-2 tablet  1-2  tablet Oral Q4H PRN Sharma Covert, MD      . DISCONTD: HYDROmorphone (DILAUDID) injection 0.5-1 mg  0.5-1 mg Intravenous Q2H PRN Sharma Covert, MD   1 mg at 12/09/11 1046  . DISCONTD: lactated ringers infusion   Intravenous Continuous Phillips Grout, MD 100 mL/hr at 12/07/11 1358    . DISCONTD: lactated ringers infusion   Intravenous Continuous Gaetano Hawthorne, MD 125 mL/hr at 12/09/11 1815    . DISCONTD: lactated ringers infusion   Intravenous Continuous Gaetano Hawthorne, MD      . DISCONTD: methocarbamol (ROBAXIN) 500 mg in dextrose 5 % 50 mL IVPB  500 mg Intravenous Q6H PRN Sharma Covert, MD      . DISCONTD: methocarbamol (ROBAXIN) tablet 500 mg  500 mg Oral Q6H PRN Sharma Covert, MD   500 mg at 12/08/11 2229  . DISCONTD: morphine 1 MG/ML PCA injection   Intravenous  Q4H Sharma Covert, MD      . DISCONTD: morphine 2 MG/ML injection 2 mg  2 mg Intravenous PRN Phillips Grout, MD   2 mg at 12/07/11 1405  . DISCONTD: mulitivitamin with minerals tablet 1 tablet  1 tablet Oral Daily Sharma Covert, MD      . DISCONTD: mulitivitamin with minerals tablet 1 tablet  1 tablet Oral Daily Sharma Covert, MD      . DISCONTD: NON FORMULARY 25 mg  25 mg Oral QHS Sharma Covert, MD      . DISCONTD: ondansetron [facility]) injection 4 mg  4 mg Intravenous Q6H PRN Sharma Covert, MD      . DISCONTD: ondansetron (ZOFRAN) tablet 4 mg  4 mg Oral Q6H PRN Sharma Covert, MD      . DISCONTD: oxyCODONE (Oxy IR/ROXICODONE) immediate release tablet 5-10 mg  5-10 mg Oral Q3H PRN Sharma Covert, MD      . DISCONTD: PRESCRIPTION MEDICATION 1 tablet  1 tablet Oral QHS Sharma Covert, MD      . DISCONTD: promethazine (PHENERGAN) injection 6.25 mg  6.25 mg Intravenous PRN Phillips Grout, MD   6.25 mg at 12/07/11 1359  . DISCONTD: promethazine (PHENERGAN) injection 6.25-12.5 mg  6.25-12.5 mg Intravenous Q15 min PRN Gaetano Hawthorne, MD      . DISCONTD: simvastatin (ZOCOR) tablet 40 mg  40 mg Oral q1800 Sharma Covert, MD   40 mg  at 12/08/11 1749  . DISCONTD: vitamin C (ASCORBIC ACID) tablet 1,000 mg  1,000 mg Oral Daily Sharma Covert, MD       Medications Prior to Admission  Medication Sig Dispense Refill  . acetaminophen-codeine (TYLENOL #4) 300-60 MG per tablet Take 1-2 tablets by mouth every 4 (four) hours as needed.  40 tablet  1  . aspirin EC 81 MG tablet Take 81 mg by mouth daily.       Marland Kitchen b complex vitamins tablet Take 1 tablet by mouth daily.       . cholecalciferol (VITAMIN D) 1000 UNITS tablet Take 2,000 Units by mouth 2 (two) times daily.       . Cranberry Extract 200 MG CAPS Take 1 capsule by mouth 2 (two) times daily.       . fish oil-omega-3 fatty acids 1000 MG capsule Take 1 g by mouth 2 (two) times daily.       . meloxicam (MOBIC) 15 MG tablet Take 15 mg by mouth at bedtime.       . Multiple Vitamin (MULITIVITAMIN WITH MINERALS) TABS Take 1 tablet by mouth daily.       . pravastatin (PRAVACHOL) 40 MG tablet Take 40 mg by mouth daily.       . raloxifene (EVISTA) 60 MG tablet Take 60 mg by mouth daily.       Marland Kitchen PRESCRIPTION MEDICATION Take 1 tablet by mouth at bedtime.       . tolterodine (DETROL LA) 4 MG 24 hr capsule Take 4 mg by mouth daily.         Results for orders placed during the hospital encounter of 12/07/11 (from the past 48 hour(s))  BASIC METABOLIC PANEL     Status: Abnormal   Collection Time   12/11/11  8:35 PM      Component Value Range Comment   Sodium 133 (*) 135 - 145 (mEq/L)    Potassium 4.0  3.5 - 5.1 (mEq/L)    Chloride 97  96 - 112 (mEq/L)    CO2 29  19 - 32 (mEq/L)    Glucose, Bld 114 (*) 70 - 99 (mg/dL)    BUN 11  6 - 23 (mg/dL)    Creatinine, Ser 9.14  0.50 - 1.10 (mg/dL)    Calcium 8.7  8.4 - 10.5 (mg/dL)    GFR calc non Af Amer 64 (*) >90 (mL/min)    GFR calc Af Amer 74 (*) >90 (mL/min)   CBC     Status: Abnormal   Collection Time   12/11/11  8:35 PM      Component Value Range Comment   WBC 9.4  4.0 - 10.5 (K/uL)    RBC 3.19 (*) 3.87 - 5.11 (MIL/uL)    Hemoglobin  9.5 (*) 12.0 - 15.0 (g/dL)    HCT 78.2 (*) 95.6 - 46.0 (%)    MCV 89.3  78.0 - 100.0 (fL)    MCH 29.8  26.0 - 34.0 (pg)    MCHC 33.3  30.0 - 36.0 (g/dL)    RDW 21.3  08.6 - 57.8 (%)    Platelets 293  150 - 400 (K/uL)   DIFFERENTIAL     Status: Normal   Collection Time   12/11/11  8:35 PM      Component Value Range Comment   Neutrophils Relative 65  43 - 77 (%)    Neutro Abs 6.1  1.7 - 7.7 (K/uL)    Lymphocytes Relative 23  12 - 46 (%)    Lymphs Abs 2.1  0.7 - 4.0 (K/uL)    Monocytes Relative 10  3 - 12 (%)    Monocytes Absolute 1.0  0.1 - 1.0 (K/uL)    Eosinophils Relative 2  0 - 5 (%)    Eosinophils Absolute 0.2  0.0 - 0.7 (K/uL)    Basophils Relative 0  0 - 1 (%)    Basophils Absolute 0.0  0.0 - 0.1 (K/uL)   CARDIAC PANEL(CRET KIN+CKTOT+MB+TROPI)     Status: Abnormal   Collection Time   12/11/11  8:35 PM      Component Value Range Comment   Total CK 348 (*) 7 - 177 (U/L)    CK, MB 2.8  0.3 - 4.0 (ng/mL)    Troponin I <0.30  <0.30 (ng/mL)    Relative Index 0.8  0.0 - 2.5    PROTIME-INR     Status: Normal   Collection Time   12/11/11  8:35 PM      Component Value Range Comment   Prothrombin Time 14.9  11.6 - 15.2 (seconds)    INR 1.15  0.00 - 1.49    URINALYSIS, ROUTINE W REFLEX MICROSCOPIC     Status: Abnormal   Collection Time   12/11/11 10:03 PM      Component Value Range Comment   Color, Urine YELLOW  YELLOW     APPearance CLOUDY (*) CLEAR     Specific Gravity, Urine 1.012  1.005 - 1.030     pH 6.0  5.0 - 8.0     Glucose, UA NEGATIVE  NEGATIVE (mg/dL)    Hgb urine dipstick NEGATIVE  NEGATIVE     Bilirubin Urine NEGATIVE  NEGATIVE     Ketones, ur NEGATIVE  NEGATIVE (mg/dL)    Protein, ur NEGATIVE  NEGATIVE (mg/dL)    Urobilinogen, UA 0.2  0.0 - 1.0 (mg/dL)    Nitrite NEGATIVE  NEGATIVE     Leukocytes, UA TRACE (*) NEGATIVE    URINE MICROSCOPIC-ADD  ON     Status: Normal   Collection Time   12/11/11 10:03 PM      Component Value Range Comment   Squamous Epithelial /  LPF RARE  RARE     WBC, UA 0-2  <3 (WBC/hpf)    Bacteria, UA RARE  RARE     Dg Chest Port 1 View  12/11/2011  *RADIOLOGY REPORT*  Clinical Data: Hypoxia.  PORTABLE CHEST - 1 VIEW  Comparison: Chest 11/29/2011.  Findings: Mild atelectasis or scarring is seen in the right lung base.  Lungs are otherwise clear.  Heart size is normal.  No pneumothorax or pleural effusion.  IMPRESSION: No acute disease.  Original Report Authenticated By: Bernadene Bell. D'ALESSIO, M.D.    ROS: General:no colds or fevers no awareness of tachycardia. Skin:no rashes or ulcers. HEENT:no blurred vision or double vision ZO:XWRUEA chest pain and palpitations VWU:JWJXBJ shortness of breath GI:no diarrhea constipation or melena GU:no dysuria NEURO:History of syncope but none recently MS:has a history of osteoarthritis in her left knee. Now with fracture and ORIF of her right humerus ENDO:history of diabetes remote history of thyroid disease   Blood pressure 117/54, pulse 105, temperature 98 F (36.7 C), temperature source Oral, resp. rate 18, height 5\' 3"  (1.6 m), weight 82.555 kg (182 lb), SpO2 98.00%. PE: General:alert and oriented x3, pleasant affect.no acute distress. HEENT:normocephalic, sclera clear. Skin:warm and dry brisk capillary refill. Right arm wrapped with Ace Neck:supple no JVD, no carotid bruits. Heart:S1-S2 regular rate and rhythm, no murmur gallop rub or click Lungs:clear without rales rhonchi or wheezes Abd: positive bowel sounds soft nontender YNW:GNFAO arm wrapped in Ace right hand is swollen but fingers are warm and blanche. No lower extremity edema. Neuro:alert and oriented x3, follows commands.   Assessment/Plan Patient Active Problem List  Diagnoses  . Humerus fracture  . Hypoxia  . Wide-complex tachycardia  . Hypotension   PLAN:Tachycardia appears to be SVT.  With conversion, she had nonspecific ST changes ant/lat. that were not present on EKG on the 16th.    CT of her chest is pending,  she is at high risk for PE with recent surgery. Agree with Lopressor. Serial cardiac enzymes and repeat EKG in AM would need cardiac cath if positive enzymes.  Otherwise stress test myoview. MD will see for further recommendations.  Please note at the end of my exam, I explained that Dr. Allyson Sabal or Dr. Rennis Golden would see her she requested that he not see her and Dr. Katrinka Blazing would be her cardiologist. We will call them in the morning to take over this cardiology consult. Would recommend serial cardiac enzymes and 2-D echo.  The CT of her chest now back and was negative for pulmonary embolus.   INGOLD,LAURA R 12/11/2011, 10:32 PM

## 2011-12-11 NOTE — Progress Notes (Signed)
Patient ID: [name], female   DOB: 02/26/1932, 78 y.o.   MRN: [id number] Post op day 2.  Pain is such that she won't/can/t move arm.  Hand very swollen ani I split dressing to mid forearm.  Her hand is warm, sensation is intact, and with encouragement will move her fingers.

## 2011-12-11 NOTE — Consults (Signed)
Hospital Admission Note Date: 12/11/2011  Patient name: [name] Medical record number: [id number] Date of birth: [birth year] Age: 78 y.o. Gender: female PCP: No primary provider on file.  Primary Service: Viann Fish Reason for Consult: Hypotension, EKG abnormality  Chief Complaint:  Right arm pain/Abnormal vitals per nursing staff  History of Present Illness: Ms. [name] is a 78 yo woman admitted to [facility] hospital for ORIF of right  Distal humerus fracture. This occurred 1 week ago after a "slip on ice", patient reports accidental fall and not associated with dizziness or syncope. She has risk factors for cardiovascular disease but has never had any know cardiac problems or evaluation. Currently she has been moved to the SD-ICU from the ortho floor because she has an acute episode of wide complex tachycardia- rate 177, regular, sustained for at least 20-30 minutes probably longer , dropped O2 sats to  87% and her BP dropped to SBP ~90's. Clinically at bedside patient appeared stable, complaining of no chest pain, SOB, diaphoresis or nausea, on right arm pain in her surgical arm, I allowed her to use her PCA with dilaudid. She was also being given a fluid bolus. The patient converted spontaneously back to NSR, sinus tachycardia while I was at the bedside and her blood pressure returned to SBP ~ 120-130. Patient has no compliants and tells me, "I feel fine, except for pain in my arm".  Meds: Medications Prior to Admission  Medication Dose Route Frequency Provider Last Rate Last Dose  . acetaminophen-codeine (TYLENOL #4) 300-60 MG per tablet 1-2 tablet  1-2 tablet Oral Q4H PRN Sharma Covert, MD      . ALPRAZolam Prudy Feeler) tablet 0.5 mg  0.5 mg Oral Q6H PRN Sharma Covert, MD      . aspirin tablet 325 mg  325 mg Oral Once Anderson Malta, DO      . B-complex with vitamin C tablet 1 tablet  1 tablet Oral Daily Sharma Covert, MD   1 tablet at 12/11/11 1040  . ceFAZolin (ANCEF) IVPB 1 g/50  mL premix  1 g Intravenous NOW Sharma Covert, MD      . chlorproMAZINE (THORAZINE) tablet 25 mg  25 mg Oral Q8H PRN Sharma Covert, MD   25 mg at 12/10/11 2154  . cholecalciferol (VITAMIN D) tablet 2,000 Units  2,000 Units Oral BID Sharma Covert, MD   2,000 Units at 12/11/11 1040  . dextrose 5 % and 0.45 % NaCl with KCl 20 mEq/L infusion   Intravenous Continuous Sharma Covert, MD 50 mL/hr at 12/11/11 1553    . diphenhydrAMINE (BENADRYL) injection 12.5 mg  12.5 mg Intravenous Q6H PRN Sharma Covert, MD       Or  . diphenhydrAMINE (BENADRYL) 12.5 MG/5ML elixir 12.5 mg  12.5 mg Oral Q6H PRN Sharma Covert, MD      . diphenhydrAMINE (BENADRYL) injection 12.5 mg  12.5 mg Intravenous Q6H PRN Liam Graham, PA       Or  . diphenhydrAMINE (BENADRYL) 12.5 MG/5ML elixir 12.5 mg  12.5 mg Oral Q6H PRN Liam Graham, PA      . diphenhydrAMINE (BENADRYL) capsule 25-50 mg  25-50 mg Oral Q6H PRN Sharma Covert, MD      . docusate sodium (COLACE) capsule 100 mg  100 mg Oral BID Sharma Covert, MD   100 mg at 12/11/11 1039  . enoxaparin (LOVENOX) injection 30 mg  30 mg Subcutaneous Q12H Sharma Covert,  MD   30 mg at 12/11/11 1040  . HYDROcodone-acetaminophen (NORCO) 5-325 MG per tablet 1-2 tablet  1-2 tablet Oral Q4H PRN Liam Graham, PA      . hydrogen peroxide 3 % external solution    PRN Sharma Covert, MD   1 application at 12/09/11 1804  . HYDROmorphone (DILAUDID) injection 0.25-0.5 mg  0.25-0.5 mg Intravenous Q5 min PRN Gaetano Hawthorne, MD      . HYDROmorphone (DILAUDID) injection 0.5-1 mg  0.5-1 mg Intravenous Q2H PRN Sharma Covert, MD   1 mg at 12/09/11 1901  . HYDROmorphone (DILAUDID) PCA injection 0.3 mg/mL   Intravenous Q4H Liam Graham, PA   10 mg at 12/11/11 1600  . magic mouthwash  5 mL Oral QID Drucilla Schmidt, MD   5 mL at 12/11/11 1500  . methocarbamol (ROBAXIN) tablet 500 mg  500 mg Oral Q6H PRN Sharma Covert, MD   500 mg at 12/10/11 1516   Or  .  methocarbamol (ROBAXIN) 500 mg in dextrose 5 % 50 mL IVPB  500 mg Intravenous Q6H PRN Sharma Covert, MD   500 mg at 12/09/11 1943  . metoprolol tartrate (LOPRESSOR) tablet 25 mg  25 mg Oral BID Anderson Malta, DO      . Mirabegron ER TB24 25 mg  25 mg Oral QHS Sharma Covert, MD   25 mg at 12/10/11 2154  . mulitivitamin with minerals tablet 1 tablet  1 tablet Oral Daily Sharma Covert, MD   1 tablet at 12/11/11 1039  . naloxone [facility]) injection 0.4 mg  0.4 mg Intravenous PRN Sharma Covert, MD       And  . sodium chloride 0.9 % injection 9 mL  9 mL Intravenous PRN Sharma Covert, MD      . naloxone [facility]) injection 0.4 mg  0.4 mg Intravenous PRN Liam Graham, PA       And  . sodium chloride 0.9 % injection 9 mL  9 mL Intravenous PRN Liam Graham, PA      . omega-3 acid ethyl esters (LOVAZA) capsule 1 g  1 g Oral BID Sharma Covert, MD   1 g at 12/11/11 1040  . ondansetron (ZOFRAN) tablet 4 mg  4 mg Oral Q6H PRN Sharma Covert, MD       Or  . ondansetron [facility]) injection 4 mg  4 mg Intravenous Q6H PRN Sharma Covert, MD      . ondansetron [facility]) injection 4 mg  4 mg Intravenous Q6H PRN Sharma Covert, MD      . ondansetron [facility]) injection 4 mg  4 mg Intravenous Q6H PRN Liam Graham, PA      . oxyCODONE (Oxy IR/ROXICODONE) immediate release tablet 5-10 mg  5-10 mg Oral Q3H PRN Liam Graham, PA      . simvastatin (ZOCOR) tablet 20 mg  20 mg Oral q1800 Sharma Covert, MD   20 mg at 12/10/11 1844  . vitamin C (ASCORBIC ACID) tablet 1,000 mg  1,000 mg Oral Daily Sharma Covert, MD   1,000 mg at 12/11/11 1039  . zolpidem (AMBIEN) tablet 5 mg  5 mg Oral QHS PRN Sharma Covert, MD      . DISCONTD: acetaminophen-codeine (TYLENOL #4) 300-60 MG per tablet 1 tablet  1 tablet Oral Q4H PRN Sharma Covert, MD      . DISCONTD: b complex vitamins tablet 1 tablet  1 tablet Oral Daily Sharma Covert, MD      . DISCONTD: ceFAZolin (ANCEF) IVPB 1 g/50 mL premix  1 g  Intravenous Q8H Sharma Covert, MD   1 g at 12/11/11 0117  . DISCONTD: ceFAZolin (ANCEF) IVPB 2 g/50 mL premix  2 g Intravenous 60 min Pre-Op Sharma Covert, MD      . DISCONTD: chlorhexidine (HIBICLENS) 4 % liquid 4 application  60 mL Topical Once Sharma Covert, MD      . DISCONTD: cholecalciferol (VITAMIN D) tablet 1,000 Units  1,000 Units Oral BID Sharma Covert, MD   1,000 Units at 12/08/11 2229  . DISCONTD: Cranberry Extract CAPS 200 mg  1 capsule Oral BID Sharma Covert, MD      . DISCONTD: dextrose 5 % and 0.45 % NaCl with KCl 20 mEq/L infusion   Intravenous Continuous Sharma Covert, MD 20 mL/hr at 12/07/11 1700 20 mL at 12/07/11 1700  . DISCONTD: diphenhydrAMINE (BENADRYL) capsule 25-50 mg  25-50 mg Oral Q6H PRN Sharma Covert, MD      . DISCONTD: docusate sodium (COLACE) capsule 100 mg  100 mg Oral BID Sharma Covert, MD   100 mg at 12/08/11 2229  . DISCONTD: fish oil-omega-3 fatty acids capsule 1 g  1 g Oral BID Sharma Covert, MD      . DISCONTD: HYDROcodone-acetaminophen [facility]) 5-325 MG per tablet 1-2 tablet  1-2 tablet Oral Q4H PRN Sharma Covert, MD      . DISCONTD: HYDROmorphone (DILAUDID) injection 0.5-1 mg  0.5-1 mg Intravenous Q2H PRN Sharma Covert, MD   1 mg at 12/09/11 1046  . DISCONTD: lactated ringers infusion   Intravenous Continuous Phillips Grout, MD 100 mL/hr at 12/07/11 1358    . DISCONTD: lactated ringers infusion   Intravenous Continuous Gaetano Hawthorne, MD 125 mL/hr at 12/09/11 1815    . DISCONTD: lactated ringers infusion   Intravenous Continuous Gaetano Hawthorne, MD      . DISCONTD: methocarbamol (ROBAXIN) 500 mg in dextrose 5 % 50 mL IVPB  500 mg Intravenous Q6H PRN Sharma Covert, MD      . DISCONTD: methocarbamol (ROBAXIN) tablet 500 mg  500 mg Oral Q6H PRN Sharma Covert, MD   500 mg at 12/08/11 2229  . DISCONTD: morphine 1 MG/ML PCA injection   Intravenous Q4H Sharma Covert, MD      . DISCONTD: morphine 2 MG/ML injection 2 mg  2 mg Intravenous PRN Phillips Grout, MD   2 mg at 12/07/11 1405  . DISCONTD: mulitivitamin with minerals tablet 1 tablet  1 tablet Oral Daily Sharma Covert, MD      . DISCONTD: mulitivitamin with minerals tablet 1 tablet  1 tablet Oral Daily Sharma Covert, MD      . DISCONTD: NON FORMULARY 25 mg  25 mg Oral QHS Sharma Covert, MD      . DISCONTD: ondansetron [facility]) injection 4 mg  4 mg Intravenous Q6H PRN Sharma Covert, MD      . DISCONTD: ondansetron [facility]) tablet 4 mg  4 mg Oral Q6H PRN Sharma Covert, MD      . DISCONTD: oxyCODONE (Oxy IR/ROXICODONE) immediate release tablet 5-10 mg  5-10 mg Oral Q3H PRN Sharma Covert, MD      . DISCONTD: PRESCRIPTION MEDICATION 1 tablet  1 tablet Oral QHS Sharma Covert, MD      . DISCONTD: promethazine (  PHENERGAN) injection 6.25 mg  6.25 mg Intravenous PRN Phillips Grout, MD   6.25 mg at 12/07/11 1359  . DISCONTD: promethazine (PHENERGAN) injection 6.25-12.5 mg  6.25-12.5 mg Intravenous Q15 min PRN Gaetano Hawthorne, MD      . DISCONTD: simvastatin (ZOCOR) tablet 40 mg  40 mg Oral q1800 Sharma Covert, MD   40 mg at 12/08/11 1749  . DISCONTD: vitamin C (ASCORBIC ACID) tablet 1,000 mg  1,000 mg Oral Daily Sharma Covert, MD       Medications Prior to Admission  Medication Sig Dispense Refill  . acetaminophen-codeine (TYLENOL #4) 300-60 MG per tablet Take 1-2 tablets by mouth every 4 (four) hours as needed.  40 tablet  1  . aspirin EC 81 MG tablet Take 81 mg by mouth daily.       Marland Kitchen b complex vitamins tablet Take 1 tablet by mouth daily.       . cholecalciferol (VITAMIN D) 1000 UNITS tablet Take 2,000 Units by mouth 2 (two) times daily.       . Cranberry Extract 200 MG CAPS Take 1 capsule by mouth 2 (two) times daily.       . fish oil-omega-3 fatty acids 1000 MG capsule Take 1 g by mouth 2 (two) times daily.       . meloxicam (MOBIC) 15 MG tablet Take 15 mg by mouth at bedtime.       . Multiple Vitamin (MULITIVITAMIN WITH MINERALS) TABS Take 1 tablet by mouth daily.       .  pravastatin (PRAVACHOL) 40 MG tablet Take 40 mg by mouth daily.       . raloxifene (EVISTA) 60 MG tablet Take 60 mg by mouth daily.       Marland Kitchen PRESCRIPTION MEDICATION Take 1 tablet by mouth at bedtime.       . tolterodine (DETROL LA) 4 MG 24 hr capsule Take 4 mg by mouth daily.         Allergies: Sulfa antibiotics Past Medical History  Diagnosis Date  . Osteoporosis   . Hyperlipemia   . Diabetes mellitus     no mes, diet only    Past Surgical History  Procedure Date  . Tonsillectomy   . Left knee arthroscopy   . Other surgical history     left small toe bone spur removed    History reviewed. No pertinent family history. History   Social History  . Marital Status: Divorced    Spouse Name: N/A    Number of Children: N/A  . Years of Education: N/A   Occupational History  . Not on file.   Social History Main Topics  . Smoking status: Former Smoker    Quit date: 12/29/2009  . Smokeless tobacco: Never Used  . Alcohol Use: No  . Drug Use: No  . Sexually Active: No   Other Topics Concern  . Not on file   Social History Narrative  . No narrative on file    Review of Systems: Pertinent items are noted in HPI.  Physical Exam: Blood pressure 89/62, pulse 180, temperature 98.6 F (37 C), temperature source Oral, resp. rate 20, height 5\' 3"  (1.6 m), weight 82.555 kg (182 lb), SpO2 96.00%. BP 89/62  Pulse 180  Temp(Src) 98.6 F (37 C) (Oral)  Resp 20  Ht 5\' 3"  (1.6 m)  Wt 82.555 kg (182 lb)  BMI 32.24 kg/m2  SpO2 96%  General Appearance:    Alert, cooperative, mild distress, appears stated  age  Head:    Normocephalic, without obvious abnormality, atraumatic  Eyes:    PERRL, conjunctiva/corneas clear, EOM's intact, fundi    benign, both eyes     Nose:   Nares normal, septum midline, mucosa normal, no drainage    or sinus tenderness  Throat:   Lips, mucosa, and tongue normal; teeth and gums normal  Neck:   Supple, symmetrical, trachea midline, no adenopathy;     thyroid:  no enlargement/tenderness/nodules; no carotid   bruit or JVD     Lungs:     Clear to auscultation bilaterally, respirations unlabored, decreased bs bilaterally/splinting  Chest Wall:    No tenderness or deformity   Heart:    Tachy, no mrg, regular     Abdomen:     Soft, non-tender, bowel sounds active all four quadrants,    no masses, no organomegaly        Extremities: Right arm in full length splint/cast, 2+ edmea, post op, some mild echymosis, left arm is stiff but good ROM, no edema, trace edema in LE but no asymmetry or tenderness  Pulses:   1+ and symmetric all extremities  Skin:   Slightly pale. no rash or lesions     Neurologic:   AOX3, follows all commands moves all 4 extremities    Lab results: Last labs done 11/29/2011  CBG:  [facility] 12/09/11 2208  GLUCAP 120*    Imaging results:  No results found.  Other results: EKG: WCT ?Aflutter w/ Gilford Rile converted to sinus tach with ST depression in lateral leads  Assessment & Plan by Problem: Patient Active Problem List  Diagnoses  . Humerus fracture  . Hypoxia  . Wide-complex tachycardia  . Hypotension   Patient with acute hypotension, hypoxia, and new wide complex tachycardia. She was transferred to SD-ICU for acute management, high risk for decompensation. Fortunately clinically she looked very good and her rhythm converted spontaneously before she became unstable.  Plan:  Determine etiology of Wide complex tacycardia- PE very high on my differential, even though she is being prophylaxed. Additionally because of her DM and HTN she is high risk for CAD/ACS and may have some cardiac ischemia uncovered by her surgery and stress. I also question if this was Afib with AB and potentially a variable in her fall at home,  I do not have any of her primary care records for review and patient seems to minimize her medical issues.  1. Obtain basic labs; BMET, CBC, PT-INR, UA, and cycle her Cardiac enzymes 2. Monitor  on tele 3. Check CT Angio to R/O PE stat 4. Will call consult to cardiology to review EKG and history and advise on WCT management further cardiac w/u needed. Fortunately is doesn't appear that she is going to need urgent cardioversion, but I will alert them to her condition in case she has additional problems. 5. Started her on oral ASA, Metoprolol.  6. Repeat 12 lead for tele changes. 7. Will hold starting treatment dose Heparin because she is post op and I need a baseline Hb and await CT results, she is on [facility] proph. 8. After IV fluid bolus, can reduce to maintenance rate.   SignedAnderson Malta 12/11/2011, 7:38 PM

## 2011-12-11 NOTE — Progress Notes (Signed)
Discharge summary dictated for admission of 11/28/2011.

## 2011-12-11 NOTE — Progress Notes (Addendum)
Physical Therapy Treatment Patient Details Name: SHAKEITHA UMBAUGH MRN: [id number] DOB: [birth year] Today's Date: 12/11/2011 [id number] 1TE,1GT  PT Assessment/Plan  PT - Assessment/Plan Comments on Treatment Session: Patient continues with pain limiting mobility, though now ambulating with assist to bathroom.  Still will need SNF rehab due to limited independence with self care and imbalance with ambulation PT Plan: Discharge plan remains appropriate PT Frequency: Min 3X/week Follow Up Recommendations: Skilled nursing facility Equipment Recommended: Defer to next venue PT Goals  Acute Rehab PT Goals Pt will go Supine/Side to Sit: with supervision PT Goal: Supine/Side to Sit - Progress: Progressing toward goal Pt will go Sit to Supine/Side: with supervision PT Goal: Sit to Supine/Side - Progress: Met Pt will go Sit to Stand: with supervision PT Goal: Sit to Stand - Progress: Progressing toward goal Pt will go Stand to Sit: with supervision Pt will Ambulate: 51 - 150 feet;with least restrictive assistive device;with min assist PT Goal: Ambulate - Progress: Progressing toward goal  PT Treatment Precautions/Restrictions  Precautions Precautions: Fall Restrictions Weight Bearing Restrictions: Yes RUE Weight Bearing: Non weight bearing Other Position/Activity Restrictions: Elevate with 3 pillows, ice Mobility (including Balance) Bed Mobility Bed Mobility: Yes Supine to Sit: 4: Min assist Supine to Sit Details (indicate cue type and reason): assist for lifting shoulders Sitting - Scoot to Edge of Bed: 5: Supervision Sit to Supine: 5: Supervision Sit to Supine - Details (indicate cue type and reason): for safety, line management Transfers Sit to Stand: 4: Min assist;From bed;From toilet Sit to Stand Details (indicate cue type and reason): due to impaired balance Stand to Sit: 4: Min assist;To toilet;To bed Ambulation/Gait Ambulation/Gait: Yes Ambulation/Gait Assistance: 4: Min  assist;3: Mod assist Ambulation/Gait Assistance Details (indicate cue type and reason): occasional LOB with increased assist needed and assist for holding arm when fatigued Ambulation Distance (Feet): 50 Feet Assistive device: None Gait velocity: slow pace  Balance Balance Assessed: Yes Static Standing Balance Static Standing - Balance Support: Right upper extremity supported Static Standing - Level of Assistance: 5: Stand by assistance Static Standing - Comment/# of Minutes: stood at sink to wash left hand Exercise  Hand Exercises Wrist Extension: AAROM;Right;10 reps;Supine Digit Composite Flexion: AAROM;Right;10 reps;Supine Digit Lifts: AROM;Right;10 reps;Supine Opposition: AROM;Right;10 reps;Supine End of Session PT - End of Session Activity Tolerance: Patient limited by pain Patient left: in bed;with call bell in reach General Behavior During Session: [facility] for tasks performed Cognition: [facility] for tasks performed  [facility] 12/11/2011, 1:35 PM

## 2011-12-11 NOTE — Progress Notes (Signed)
Pt HR at 1829 was 178 and bp 125/73; 1835 HR was 180 and bp-89/62.  Rapid response nurse called at 1836; after evaluating the patient and speaking with the PA it was determined that the pt needed to be transferred to the step down unit. Pt transferred at 1911 and report giving to charge nurse in step down unit.

## 2011-12-11 NOTE — Op Note (Signed)
[name], [name] NO.:  [id number]  MEDICAL RECORD NO.:  [id number]  LOCATION:  1236                         FACILITY:  [facility]  PHYSICIAN:  Madelynn Done, MD  DATE OF BIRTH:  06/26/1932  DATE OF PROCEDURE:  12/09/2011 DATE OF DISCHARGE:                              OPERATIVE REPORT   PREOPERATIVE DIAGNOSIS:  Right comminuted intracondylar, intraarticular distal humerus fracture, 4 or more fragments.  POSTOPERATIVE DIAGNOSIS:  Right comminuted intracondylar, intraarticular distal humerus fracture, 4 or more fragments.  ATTENDING PHYSICIAN:  Dr. Bradly Bienenstock who was scrubbed and present for the entire procedure.  ASSISTANT SURGEON:  Marlowe Kays, M.D. who was scrubbed and present for the entire procedure, and needed for the complexity of the case.  ANESTHESIA:  General via endotracheal tube as well as interscalene block performed by Dr. Raymon Mutton,  Anesthesia.  SURGICAL PROCEDURE: 1. Right elbow open treatment of displaced intra-articular distal     humerus fracture 4 or more fragments with internal fixation. 2. Right elbow ulnar nerve release and anterior transposition. 3. Radiographs, 3 views, right elbow.  SURGICAL IMPLANTS:  DePuy posterior lateral elbow plate, DePuy distal humerus plate as well as olecranon plate for the osteotomy.  SURGICAL INDICATIONS:  The patient is a 78 year old left-hand dominant female who sustained a fall and injury to her nondominant elbow.  The patient was seen and evaluated in the emergency department, followed in the office.  With the degree of displacement, it was recommended that she undergo the above procedure.  Risks, benefits, and alternatives were discussed in detail with the patient and signed informed consent obtained.  Risks include, but not limited to bleeding, infection, damage to nearby nerves, arteries, tendons, nonunion, malunion, hardware failure, loss of motion of the elbow, wrist, and digits, and need  for further surgical intervention for condition such as heterotopic bone on bone ossification, displacement of fracture,  loss of mobility and arthrofibrosis of the elbow joint.  DESCRIPTION OF PROCEDURE:  The patient was properly identified in the preop holding area and a mark with permanent marker was made on the right elbow to indicate correct operative site.  The patient was brought back to the operating room, placed supine on anesthesia room table. General anesthesia was administered via endotracheal tube.  The patient tolerated this well.  A sterile Foley catheter was placed without difficulty sterilely. SCDs were then applied.  The patient was then placed in a beanbag and placed in a lateral decubitus position with a right side axillary roll used.  All pressure points were well padded. The right upper extremity was then prepped and draped in a normal sterile fashion.  Time-out was called, correct side was identified, and procedure then begun.  Attention was turned to the posterior aspect posterior aspect of the elbow, a curvilinear incision made directly over the posterior aspect of the elbow.  Dissection was carried down through the skin and subcutaneous tissue.  The ulnar nerve was then identified medially and ulnar nerve was then released both proximally and distally and a small vessel loop was placed around the ulnar nerve, then retracted and protected throughout the entire duration of the procedure. Once this was carried  out, a chevron-type olecranon osteotomy was then carried out of the proximal olecranon.  Osteotomy was made with a sagittal saw and completed using small osteotomes.  This allowed for elevation of the olecranon at the triceps.  A slit was then made medially and laterally to elevate the triceps and to expose the fracture site.  The patient did have a high degree of comminution, 5 more fragments.  Several loose fragments were then removed when in the olecranon  fossa.  The patient primarily had 3 main fragments, the lateral condyle, capitulum and the trochlea, which was all one piece and the medial epicondyle and the medial epicondylar fragments.  In order to recreate the spool, reduction clamp was then applied, it was held in place with several K-wires and felt to be in good position.  Once this was carried out, in order to get good purchase a 4-0 cannulated screw was then drilled going across the spool with good compression across the fracture fragment maintaining the distal humeral articular segment in good position with the cannulated screw.  Because of where the fracture pattern was I was not able to place 2 screws, but felt good purchase with the 1 screw across all 3 fractured fragments.  This lined up the articular surface very well.  Once the articular surface was reduced it was maintained, it was reduced to the shaft with K-wires.  Once this was carried out, we were deciding whether or not to place the medial lateral plate versus 45-40 plating with a posterior lateral plate.  The posterior lateral plate contoured better and allowed [place] better purchase. Therefore on the posterior lateral side with the fragment reduced to the shaft posterior lateral plate was then applied using a combination of locking screws distally and 6 bicortices with 3 screws proximally to the fracture site with good purchase.  The screw positions were then confirmed using the mini C-arm.  After posterior lateral plating attention was then turned medially where the medial plate was then held in place with the K-wires confirmed using mini C-arm, and then fixed distally with 2 locking screws of the appropriate size and measurement. And then 8 cortices were obtained proximally with a combination of nonlocking  bicortical screw purchase.  I did obtain good stability with a 90-90 plating system.  Thorough irrigation was done throughout.  The K- wires had been removed with  application of the plate.  Final stress radiography was then carried out to confirm the reduction.  There was good articular restoration.  Internal fixation was then carried out to the comminuted distal humerus fracture.  Attention was then turned to the olecranon plating.  Prior to the olecranon osteotomy the oblong screw hole the was  placed, the olecranon plate and a screw hole placed distally. The screw holes were then filled and then the plate reapplied placing it with light compression.  Two more screw holes were then placed proximally and the remaining 6 cortices obtained distally for the olecranon osteotomy.  There was a slight gap in the olecranon osteotomy site from removal of the bone from the saw blade.  There was good articular congruity with good flexion and extension under live fluoro. The wounds were then thoroughly irrigated.  Given the soft tissue abundance that the patient had on the medial aspect, an anterior subcutaneous transposition was then carried out, creating a small flap to transpose the nerve anteriorly.  The wound was then thoroughly irrigated.  The fascial layer was then closed with 0 Vicryl suture.  This was done at both the medial and lateral triceps region as well as covering over the olecranon osteotomy plate.  The deep fascia was closed with 2-0 Vicryl.  The subcutaneous tissue was closed with 4-0 Vicryl and skin closed with skin staples.  Xeroform dressing and sterile compressive bandage were then applied.  The patient was then placed in a long-arm splint, extubated, and taken to recovery room in good condition.  Intraoperative radiographs, stress radiography of the elbow, did show olecranon osteotomy with good congruency of the radial capitulum and ulnar humeral joint.  POSTOPERATIVE PLAN:  The patient will be admitted for IV antibiotics, pain control, discharged when her pain is controlled, seen back in the office in approximately 2 weeks for  x-rays and then with a long arm splint with therapy, long-arm splint to be worn statically and then begin some active range of motion around the 3 week mark to allow lots of soft tissue swelling in the wound to heal.  Radiographs at each visit.  Given the complexity and the high nature of the procedure the surgeon was needed for exposure of the fracture fragment, reduction and application of the plate.   Medicine the patient for dictating on the patient came for a matter of fact.     Madelynn Done, MD     FWO/MEDQ  D:  12/11/2011  T:  12/11/2011  Job:  [id number]

## 2011-12-11 NOTE — Significant Event (Signed)
Rapid Response Event Note  Overview: Time Called: 1830 Arrival Time: 1833 Event Type: Cardiac  Initial Focused Assessment:   Interventions:   Event Summary:   at  Called to room 1609 for elevated HR.  Pt awake and alert, denies chest pain or SOB.  Wells Guiles RN, AC at bedside also.  Monitor show rapid sl wide complex rhythm.  EKG done. Micki Riley PA for ortho called for orders.  Will transfer pt to SD for closer observation.   NS bolus started for BP of 77/54.  Pt remains asymptomatic.  Transferred to room 1236. Dr Phillips Odor here upon arrival, pt converted to SR.  Will get CTof chest to R/O PE.    at          Natasha Bence

## 2011-12-11 NOTE — Discharge Summary (Signed)
NAMEAQUILA, Monique NO.:  [id number]  MEDICAL RECORD NO.:  [id number]  LOCATION:  1337                         FACILITY:  [facility]  PHYSICIAN:  Marlowe Kays, M.D.  DATE OF BIRTH:  1932-07-11  DATE OF ADMISSION:  11/28/2011 DATE OF DISCHARGE:  12/04/2011                              DISCHARGE SUMMARY   CONSULTS:  Madelynn Done, MD  ADMITTING DIAGNOSES: 1. Osteoporosis. 2. Hyperlipidemia. 3. Severe highly comminuted distal humeral fracture on the right.  DISCHARGE DIAGNOSES: 1. Osteoporosis. 2. Hyperlipidemia. 3. Severe highly comminuted distal humeral fracture on the right.  OPERATIONS:  None.  BRIEF HISTORY:  This patient fell the evening prior to discharge when she was getting her mail.  She slipped on some pine needles and fell directly on her right elbow.  She was brought to the emergency room where the x-ray showed a comminuted condylar fracture.  There was radial and ulnar displacement epicondylar fracture with minimal anterior displacement of the distal humerus.  The patient was placed in a large splint.  Dr. Melvyn Novas saw the patient on consult and it was decided this possibly could be handled nonoperatively; the patient  saw me nonoperatively.  She is to be followed up in the office.  Her neurovascular remained intact to the right upper extremity and hands. She had a mild amount of pain that is controlled with p.o. analgesics. It was decided she could be maintained at home, to follow up in the office and arrangements were made for discharge.  MEDICATIONS ON DISCHARGE:  Vitamin C, simvastatin, Lovaza, Delta vitamins, B complex with vitamin C.  PLAN:  She will return to the office in about a week.  We will re-x-ray her elbow.    Vishnu Moeller L. Cherlynn June.   ______________________________ Marlowe Kays, M.D.   DLU/MEDQ  D:  12/11/2011  T:  12/11/2011  Job:  [id number]

## 2011-12-12 ENCOUNTER — Other Ambulatory Visit: Payer: Self-pay

## 2011-12-12 DIAGNOSIS — D509 Iron deficiency anemia, unspecified: Secondary | ICD-10-CM

## 2011-12-12 HISTORY — DX: Iron deficiency anemia, unspecified: D50.9

## 2011-12-12 LAB — BASIC METABOLIC PANEL
CO2: 27 mEq/L (ref 19–32)
Chloride: 98 mEq/L (ref 96–112)
GFR calc Af Amer: >90 mL/min (ref >90)
Sodium: 133 mEq/L — ABNORMAL LOW (ref 135–145)

## 2011-12-12 LAB — TSH: TSH: 2.547 u[IU]/mL (ref 0.350–4.500)

## 2011-12-12 LAB — CARDIAC PANEL(CRET KIN+CKTOT+MB+TROPI)
CK, MB: 3.6 ng/mL (ref 0.3–4.0)
CK, MB: 2.8 ng/mL (ref 0.3–4.0)
Relative Index: 1.3 (ref 0.0–2.5)
Total CK: 215 U/L — ABNORMAL HIGH (ref 7–177)
Troponin I: 0.41 ng/mL (ref <0.30)

## 2011-12-12 LAB — FERRITIN: Ferritin: 530 ng/mL — ABNORMAL HIGH (ref 10–291)

## 2011-12-12 LAB — CBC
HCT: 25.8 % — ABNORMAL LOW (ref 36.0–46.0)
MCV: 88.4 fL (ref 78.0–100.0)
Platelets: 330 10*3/uL (ref 150–400)
RBC: 2.92 MIL/uL — ABNORMAL LOW (ref 3.87–5.11)
WBC: 7.3 10*3/uL (ref 4.0–10.5)

## 2011-12-12 MED ORDER — HEPARIN BOLUS VIA INFUSION
3500.0000 [IU] | Freq: Once | INTRAVENOUS | Status: AC
  Administered 2011-12-12: 3500 [IU] via INTRAVENOUS
  Filled 2011-12-12: qty 3500

## 2011-12-12 MED ORDER — HEPARIN SOD (PORCINE) IN D5W 100 UNIT/ML IV SOLN
900.0000 [IU]/h | INTRAVENOUS | Status: DC
  Administered 2011-12-12: 900 [IU]/h via INTRAVENOUS
  Filled 2011-12-12: qty 250

## 2011-12-12 MED ORDER — HYDROMORPHONE HCL PF 1 MG/ML IJ SOLN
1.0000 mg | INTRAMUSCULAR | Status: DC | PRN
  Administered 2011-12-12 – 2011-12-13 (×8): 1 mg via INTRAVENOUS
  Administered 2011-12-14: 2 mg via INTRAVENOUS
  Filled 2011-12-12 (×5): qty 1
  Filled 2011-12-12: qty 19
  Filled 2011-12-12 (×3): qty 1

## 2011-12-12 MED FILL — Mupirocin Oint 2%: CUTANEOUS | Qty: 22 | Status: AC

## 2011-12-12 NOTE — Consult Note (Signed)
Subjective: Patient denies any chest pain. No SOB. Patient states feeling miserable. C/O of RUE pain.  Objective: Vital signs in last 24 hours: Filed Vitals:   12/12/11 0445 12/12/11 0447 12/12/11 0800 12/12/11 0840  BP: 125/48     Pulse: 75     Temp:   98.2 F (36.8 C)   TempSrc:   Oral   Resp: 13 13  14   Height:      Weight:      SpO2: 100%       Intake/Output Summary (Last 24 hours) at 12/12/11 0856 Last data filed at 12/12/11 0400  Gross per 24 hour  Intake  602.1 ml  Output   1150 ml  Net -547.9 ml    Weight change:   General: Alert, awake, oriented x3, in no acute distress. HEENT: No bruits, no goiter. Heart: Regular rate and rhythm, without murmurs, rubs, gallops. Lungs: Clear to auscultation bilaterally in anterior lung fields Abdomen: Soft, nontender, nondistended, positive bowel sounds. Extremities: No clubbing cyanosis or edema with positive pedal pulses. Neuro: Grossly intact, nonfocal.    Lab Results:  [facility] 12/11/11 2035  NA 133*  K 4.0  CL 97  CO2 29  GLUCOSE 114*  BUN 11  CREATININE 0.86  CALCIUM 8.7  MG --  PHOS --   No results found for this basename: AST:2,ALT:2,ALKPHOS:2,BILITOT:2,PROT:2,ALBUMIN:2 in the last 72 hours No results found for this basename: LIPASE:2,AMYLASE:2 in the last 72 hours  Basename 12/11/11 2035  WBC 9.4  NEUTROABS 6.1  HGB 9.5*  HCT 28.5*  MCV 89.3  PLT 293    Basename 12/12/11 0520 12/11/11 2035  CKTOTAL 288* 348*  CKMB 3.6 2.8  CKMBINDEX -- --  TROPONINI 0.41* <0.30   No components found with this basename: POCBNP:3 No results found for this basename: DDIMER:2 in the last 72 hours No results found for this basename: HGBA1C:2 in the last 72 hours No results found for this basename: CHOL:2,HDL:2,LDLCALC:2,TRIG:2,CHOLHDL:2,LDLDIRECT:2 in the last 72 hours  Basename 12/11/11 2035  TSH 2.547  T4TOTAL --  T3FREE --  THYROIDAB --   No results found for this basename:  VITAMINB12:2,FOLATE:2,FERRITIN:2,TIBC:2,IRON:2,RETICCTPCT:2 in the last 72 hours  Micro Results: Recent Results (from the past 240 hour(s))  SURGICAL PCR SCREEN     Status: Abnormal   Collection Time   12/07/11 12:52 PM      Component Value Range Status Comment   MRSA, PCR INVALID RESULTS, SPECIMEN SENT FOR CULTURE (*) NEGATIVE  Final    Staphylococcus aureus INVALID RESULTS, SPECIMEN SENT FOR CULTURE (*) NEGATIVE  Final   MRSA CULTURE     Status: Normal   Collection Time   12/07/11 12:52 PM      Component Value Range Status Comment   Specimen Description NOSE   Final    Special Requests NONE   Final    Culture     Final    Value: FEW STAPHYLOCOCCUS AUREUS     Note: NOMRSA   Report Status 12/09/2011 FINAL   Final     Studies/Results: Ct Angio Chest W/cm &/or Wo Cm  12/11/2011  *RADIOLOGY REPORT*  Clinical Data: Hypoxia.  CT ANGIOGRAPHY CHEST  Technique:  Multidetector CT imaging of the chest using the standard protocol during bolus administration of intravenous contrast. Multiplanar reconstructed images including MIPs were obtained and reviewed to evaluate the vascular anatomy.  Contrast: OMNIPAQUE IOHEXOL 300 MG/ML IV SOLN  Comparison: Plain film chest 11/29/2011.  Findings: No pulmonary embolus is identified.  Heart size is  normal.  No pleural or pericardial effusion.  No axillary, hilar or mediastinal lymphadenopathy.  Coronary and aortic atherosclerotic vascular disease is noted.  Lungs show only some mild dependent atelectatic change.  Incidentally imaged upper abdomen is unremarkable.  No focal bony abnormality.  IMPRESSION: Negative for pulmonary embolus.  No acute finding.  Original Report Authenticated By: Bernadene Bell. Maricela Curet, M.D.   Dg Chest Port 1 View  12/11/2011  *RADIOLOGY REPORT*  Clinical Data: Hypoxia.  PORTABLE CHEST - 1 VIEW  Comparison: Chest 11/29/2011.  Findings: Mild atelectasis or scarring is seen in the right lung base.  Lungs are otherwise clear.  Heart size  is normal.  No pneumothorax or pleural effusion.  IMPRESSION: No acute disease.  Original Report Authenticated By: Bernadene Bell. Maricela Curet, M.D.    Medications:     . aspirin  325 mg Oral Once  . B-complex with vitamin C  1 tablet Oral Daily  . cholecalciferol  2,000 Units Oral BID  . docusate sodium  100 mg Oral BID  . heparin  3,500 Units Intravenous Once  . HYDROmorphone PCA 0.3 mg/mL   Intravenous Q4H  . magic mouthwash  5 mL Oral QID  . metoprolol tartrate  25 mg Oral BID  . Mirabegron ER  25 mg Oral QHS  . mulitivitamin with minerals  1 tablet Oral Daily  . omega-3 acid ethyl esters  1 g Oral BID  . simvastatin  20 mg Oral q1800  . vitamin C  1,000 mg Oral Daily  . DISCONTD:  ceFAZolin (ANCEF) IV  1 g Intravenous Q8H  . DISCONTD: enoxaparin  30 mg Subcutaneous Q12H    Assessment: Principal Problem:  *Wide-complex tachycardia Active Problems:  Humerus fracture  Hypoxia  Hypotension  Anemia   Plan: #1 Wide Complex tacycardia Now in NSR with occasional PVCs per telemetry. Ct chest negative for PE. BMET unremarkable. CBC with prob postop anemia. First set cardiac enzymes with elevated troponins. Check FLP. Continue ASA, metoprolol. May need stress test vs cath. Cardiology following. #2. Anemia Likely postop anemia. No overt GI bleed. Check anemia panel. Follow H/H. #3. Hypotension Likely secondary to #1. Resolved. #4.Humerus fracture- s/p ORIF. Per primary team.   LOS: 5 days   [facility] 12/12/2011, 8:56 AM

## 2011-12-12 NOTE — Consult Note (Signed)
Reason for Consult: Tachycardia, abnormal enzyme  Referring Physician: [facility]   [name] is an 78 y.o. female.    Chief Complaint:  Pt. Without symptoms, found to be tachycardic with HR 178.  BP was 77/54.  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 She stated it was an accidental fall it was no syncope involved. We are asked to consult as patient was found to be in a rapid tachycardia of 178 beats per minute the systolic blood pressure of 77 last night. She was given IV fluid bolus a 500 cc and then she converted spontaneously to sinus rhythm sinus tach. Her blood pressure returned to 120-130 systolic.  She had no awareness of rapid heartbeat. She denied chest pain or shortness of breath. Currently she is stable without complaints except for right arm pain at the surgery site.  The EKG after conversion to sinus rhythm reveals mild ST depression in leads V2 through V6.  She denies any history of tachycardia in the past but again she was not aware of this tachycardia.  She has no cardiac history that she is aware of.  She does have a history of hyper thyroid and hypo-thyroid many years ago.  She is diabetic. Her mother had a heart attack in her 23s her father died with CVA there is no coronary disease in her siblings.  She denies any recent shortness of breath with exertion.  She does admit to postural hypotension with syncope which began when she was in the first grade. This is treated by not standing still very long.  Past Medical History  Diagnosis Date  . Osteoporosis   . Hyperlipemia   . Diabetes mellitus     no mes, diet only     Past Surgical History  Procedure Date  . Tonsillectomy   . Left knee arthroscopy   . Other surgical history     left small toe bone spur removed     History reviewed. No pertinent family history. see above for coronary disease in the  family Social History:  reports that she quit smoking about 1 years ago. She has never used smokeless tobacco. She reports that she does not drink alcohol or use illicit drugs. she is divorced with 2 children. She states she is quite active.  Allergies:  Allergies  Allergen Reactions  . Sulfa Antibiotics Hives, Diarrhea and Nausea And Vomiting    Facial swelling    Medications Prior to Admission  Medication Dose Route Frequency Provider Last Rate Last Dose  . acetaminophen-codeine (TYLENOL #4) 300-60 MG per tablet 1-2 tablet  1-2 tablet Oral Q4H PRN Monique Covert, MD      . ALPRAZolam Prudy Feeler) tablet 0.5 mg  0.5 mg Oral Q6H PRN Monique Covert, MD      . aspirin tablet 325 mg  325 mg Oral Once Monique Malta, DO   325 mg at 12/11/11 2021  . B-complex with vitamin C tablet 1 tablet  1 tablet Oral Daily Monique Covert, MD   1 tablet at 12/12/11 1107  . ceFAZolin (ANCEF) IVPB 1 g/50 mL premix  1 g Intravenous NOW Monique Covert, MD      . chlorproMAZINE (THORAZINE) tablet 25 mg  25 mg Oral Q8H PRN Monique Covert, MD   25 mg at 12/10/11 2154  . cholecalciferol (VITAMIN D) tablet 2,000 Units  2,000 Units Oral BID [name]  Monique Cooper, MD   2,000 Units at 12/12/11 1108  . dextrose 5 % and 0.45 % NaCl with KCl 20 mEq/L infusion   Intravenous Continuous Monique Covert, MD 50 mL/hr at 12/11/11 1553    . diphenhydrAMINE (BENADRYL) injection 12.5 mg  12.5 mg Intravenous Q6H PRN Monique Covert, MD       Or  . diphenhydrAMINE (BENADRYL) 12.5 MG/5ML elixir 12.5 mg  12.5 mg Oral Q6H PRN Monique Covert, MD      . diphenhydrAMINE (BENADRYL) injection 12.5 mg  12.5 mg Intravenous Q6H PRN Monique Graham, PA       Or  . diphenhydrAMINE (BENADRYL) 12.5 MG/5ML elixir 12.5 mg  12.5 mg Oral Q6H PRN Monique Graham, PA      . diphenhydrAMINE (BENADRYL) capsule 25-50 mg  25-50 mg Oral Q6H PRN Monique Covert, MD      . docusate sodium (COLACE) capsule 100 mg  100 mg Oral BID Monique Covert, MD   100 mg at  12/11/11 2312  . heparin bolus via infusion 3,500 Units  3,500 Units Intravenous Once [facility], PHARMD   3,500 Units at 12/12/11 1610   Followed by  . heparin ADULT infusion 100 units/ml (25000 units/250 ml)  900 Units/hr Intravenous Continuous Theda Sers, PHARMD 9 mL/hr at 12/12/11 0836 900 Units/hr at 12/12/11 0836  . HYDROcodone-acetaminophen (NORCO) 5-325 MG per tablet 1-2 tablet  1-2 tablet Oral Q4H PRN Monique Graham, PA      . hydrogen peroxide 3 % external solution    PRN Monique Covert, MD   1 application at 12/09/11 1804  . HYDROmorphone (DILAUDID) injection 0.25-0.5 mg  0.25-0.5 mg Intravenous Q5 min PRN Monique Hawthorne, MD      . HYDROmorphone (DILAUDID) injection 0.5-1 mg  0.5-1 mg Intravenous Q2H PRN Monique Covert, MD   1 mg at 12/09/11 1901  . HYDROmorphone (DILAUDID) injection 1-2 mg  1-2 mg Intravenous Q4H PRN Monique Harvest, MD   1 mg at 12/12/11 1314  . HYDROmorphone (DILAUDID) PCA injection 0.3 mg/mL   Intravenous Q4H Monique Graham, PA   1.2 mL at 12/12/11 0447  . iohexol (OMNIPAQUE) 300 MG/ML solution 100 mL  100 mL Intravenous Once PRN Medication Radiologist, MD   100 mL at 12/11/11 2239  . magic mouthwash  5 mL Oral QID Drucilla Schmidt, MD   5 mL at 12/12/11 1315  . methocarbamol (ROBAXIN) tablet 500 mg  500 mg Oral Q6H PRN Monique Covert, MD   500 mg at 12/10/11 1516   Or  . methocarbamol (ROBAXIN) 500 mg in dextrose 5 % 50 mL IVPB  500 mg Intravenous Q6H PRN Monique Covert, MD   500 mg at 12/09/11 1943  . metoprolol tartrate (LOPRESSOR) tablet 25 mg  25 mg Oral BID Monique Malta, DO   25 mg at 12/12/11 1107  . Mirabegron ER TB24 25 mg  25 mg Oral QHS Monique Covert, MD   25 mg at 12/11/11 2320  . mulitivitamin with minerals tablet 1 tablet  1 tablet Oral Daily Monique Covert, MD   1 tablet at 12/12/11 1107  . naloxone [facility]) injection 0.4 mg  0.4 mg Intravenous PRN Monique Covert, MD       And  . sodium chloride 0.9 % injection 9 mL  9  mL Intravenous PRN Monique Covert, MD      . naloxone [facility]) injection 0.4 mg  0.4 mg Intravenous PRN Monique Graham, PA       And  . sodium chloride 0.9 % injection 9 mL  9 mL Intravenous PRN Monique Graham, PA      . omega-3 acid ethyl esters (LOVAZA) capsule 1 g  1 g Oral BID Monique Covert, MD   1 g at 12/12/11 1107  . ondansetron (ZOFRAN) tablet 4 mg  4 mg Oral Q6H PRN Monique Covert, MD       Or  . ondansetron [facility]) injection 4 mg  4 mg Intravenous Q6H PRN Monique Covert, MD      . ondansetron [facility]) injection 4 mg  4 mg Intravenous Q6H PRN Monique Covert, MD      . ondansetron [facility]) injection 4 mg  4 mg Intravenous Q6H PRN Monique Graham, PA      . oxyCODONE (Oxy IR/ROXICODONE) immediate release tablet 5-10 mg  5-10 mg Oral Q3H PRN Monique Graham, PA      . simvastatin (ZOCOR) tablet 20 mg  20 mg Oral q1800 Monique Covert, MD   20 mg at 12/10/11 1844  . vitamin C (ASCORBIC ACID) tablet 1,000 mg  1,000 mg Oral Daily Monique Covert, MD   1,000 mg at 12/12/11 1107  . zolpidem (AMBIEN) tablet 5 mg  5 mg Oral QHS PRN Monique Covert, MD      . DISCONTD: acetaminophen-codeine (TYLENOL #4) 300-60 MG per tablet 1 tablet  1 tablet Oral Q4H PRN Monique Covert, MD      . DISCONTD: b complex vitamins tablet 1 tablet  1 tablet Oral Daily Monique Covert, MD      . DISCONTD: ceFAZolin (ANCEF) IVPB 1 g/50 mL premix  1 g Intravenous Q8H Monique Covert, MD   1 g at 12/11/11 0117  . DISCONTD: ceFAZolin (ANCEF) IVPB 2 g/50 mL premix  2 g Intravenous 60 min Pre-Op Monique Covert, MD      . DISCONTD: chlorhexidine (HIBICLENS) 4 % liquid 4 application  60 mL Topical Once Monique Covert, MD      . DISCONTD: cholecalciferol (VITAMIN D) tablet 1,000 Units  1,000 Units Oral BID Monique Covert, MD   1,000 Units at 12/08/11 2229  . DISCONTD: Cranberry Extract CAPS 200 mg  1 capsule Oral BID Monique Covert, MD      . DISCONTD: dextrose 5 % and 0.45 % NaCl with KCl 20 mEq/L infusion    Intravenous Continuous Monique Covert, MD 20 mL/hr at 12/07/11 1700 20 mL at 12/07/11 1700  . DISCONTD: diphenhydrAMINE (BENADRYL) capsule 25-50 mg  25-50 mg Oral Q6H PRN Monique Covert, MD      . DISCONTD: docusate sodium (COLACE) capsule 100 mg  100 mg Oral BID Monique Covert, MD   100 mg at 12/08/11 2229  . DISCONTD: enoxaparin (LOVENOX) injection 30 mg  30 mg Subcutaneous Q12H Monique Covert, MD   30 mg at 12/11/11 2312  . DISCONTD: fish oil-omega-3 fatty acids capsule 1 g  1 g Oral BID Monique Covert, MD      . DISCONTD: HYDROcodone-acetaminophen [facility]) 5-325 MG per tablet 1-2 tablet  1-2 tablet Oral Q4H PRN Monique Covert, MD      . DISCONTD: HYDROmorphone (DILAUDID) injection 0.5-1 mg  0.5-1 mg Intravenous Q2H PRN Monique Covert, MD   1 mg at 12/09/11 1046  . DISCONTD: lactated ringers infusion   Intravenous Continuous Theron Arista  Acey Lav, MD 100 mL/hr at 12/07/11 1358    . DISCONTD: lactated ringers infusion   Intravenous Continuous Monique Hawthorne, MD 125 mL/hr at 12/09/11 1815    . DISCONTD: lactated ringers infusion   Intravenous Continuous Monique Hawthorne, MD      . DISCONTD: methocarbamol (ROBAXIN) 500 mg in dextrose 5 % 50 mL IVPB  500 mg Intravenous Q6H PRN Monique Covert, MD      . DISCONTD: methocarbamol (ROBAXIN) tablet 500 mg  500 mg Oral Q6H PRN Monique Covert, MD   500 mg at 12/08/11 2229  . DISCONTD: morphine 1 MG/ML PCA injection   Intravenous Q4H Monique Covert, MD      . DISCONTD: morphine 2 MG/ML injection 2 mg  2 mg Intravenous PRN Phillips Grout, MD   2 mg at 12/07/11 1405  . DISCONTD: mulitivitamin with minerals tablet 1 tablet  1 tablet Oral Daily Monique Covert, MD      . DISCONTD: mulitivitamin with minerals tablet 1 tablet  1 tablet Oral Daily Monique Covert, MD      . DISCONTD: NON FORMULARY 25 mg  25 mg Oral QHS Monique Covert, MD      . DISCONTD: ondansetron [facility]) injection 4 mg  4 mg Intravenous Q6H PRN Monique Covert, MD      . DISCONTD: ondansetron (ZOFRAN)  tablet 4 mg  4 mg Oral Q6H PRN Monique Covert, MD      . DISCONTD: oxyCODONE (Oxy IR/ROXICODONE) immediate release tablet 5-10 mg  5-10 mg Oral Q3H PRN Monique Covert, MD      . DISCONTD: PRESCRIPTION MEDICATION 1 tablet  1 tablet Oral QHS Monique Covert, MD      . DISCONTD: promethazine (PHENERGAN) injection 6.25 mg  6.25 mg Intravenous PRN Phillips Grout, MD   6.25 mg at 12/07/11 1359  . DISCONTD: promethazine (PHENERGAN) injection 6.25-12.5 mg  6.25-12.5 mg Intravenous Q15 min PRN Monique Hawthorne, MD      . DISCONTD: simvastatin (ZOCOR) tablet 40 mg  40 mg Oral q1800 Monique Covert, MD   40 mg at 12/08/11 1749  . DISCONTD: vitamin C (ASCORBIC ACID) tablet 1,000 mg  1,000 mg Oral Daily Monique Covert, MD       Medications Prior to Admission  Medication Sig Dispense Refill  . acetaminophen-codeine (TYLENOL #4) 300-60 MG per tablet Take 1-2 tablets by mouth every 4 (four) hours as needed.  40 tablet  1  . aspirin EC 81 MG tablet Take 81 mg by mouth daily.       Marland Kitchen b complex vitamins tablet Take 1 tablet by mouth daily.       . cholecalciferol (VITAMIN D) 1000 UNITS tablet Take 2,000 Units by mouth 2 (two) times daily.       . Cranberry Extract 200 MG CAPS Take 1 capsule by mouth 2 (two) times daily.       . fish oil-omega-3 fatty acids 1000 MG capsule Take 1 g by mouth 2 (two) times daily.       . meloxicam (MOBIC) 15 MG tablet Take 15 mg by mouth at bedtime.       . Multiple Vitamin (MULITIVITAMIN WITH MINERALS) TABS Take 1 tablet by mouth daily.       . pravastatin (PRAVACHOL) 40 MG tablet Take 40 mg by mouth daily.       . raloxifene (EVISTA) 60 MG tablet Take 60 mg by mouth daily.       Marland Kitchen  PRESCRIPTION MEDICATION Take 1 tablet by mouth at bedtime.       . tolterodine (DETROL LA) 4 MG 24 hr capsule Take 4 mg by mouth daily.         Results for orders placed during the hospital encounter of 12/07/11 (from the past 48 hour(s))  BASIC METABOLIC PANEL     Status: Abnormal   Collection Time    12/11/11  8:35 PM      Component Value Range Comment   Sodium 133 (*) 135 - 145 (mEq/L)    Potassium 4.0  3.5 - 5.1 (mEq/L)    Chloride 97  96 - 112 (mEq/L)    CO2 29  19 - 32 (mEq/L)    Glucose, Bld 114 (*) 70 - 99 (mg/dL)    BUN 11  6 - 23 (mg/dL)    Creatinine, Ser 0.45  0.50 - 1.10 (mg/dL)    Calcium 8.7  8.4 - 10.5 (mg/dL)    GFR calc non Af Amer 64 (*) >90 (mL/min)    GFR calc Af Amer 74 (*) >90 (mL/min)   CBC     Status: Abnormal   Collection Time   12/11/11  8:35 PM      Component Value Range Comment   WBC 9.4  4.0 - 10.5 (K/uL)    RBC 3.19 (*) 3.87 - 5.11 (MIL/uL)    Hemoglobin 9.5 (*) 12.0 - 15.0 (g/dL)    HCT 40.9 (*) 81.1 - 46.0 (%)    MCV 89.3  78.0 - 100.0 (fL)    MCH 29.8  26.0 - 34.0 (pg)    MCHC 33.3  30.0 - 36.0 (g/dL)    RDW 91.4  78.2 - 95.6 (%)    Platelets 293  150 - 400 (K/uL)   DIFFERENTIAL     Status: Normal   Collection Time   12/11/11  8:35 PM      Component Value Range Comment   Neutrophils Relative 65  43 - 77 (%)    Neutro Abs 6.1  1.7 - 7.7 (K/uL)    Lymphocytes Relative 23  12 - 46 (%)    Lymphs Abs 2.1  0.7 - 4.0 (K/uL)    Monocytes Relative 10  3 - 12 (%)    Monocytes Absolute 1.0  0.1 - 1.0 (K/uL)    Eosinophils Relative 2  0 - 5 (%)    Eosinophils Absolute 0.2  0.0 - 0.7 (K/uL)    Basophils Relative 0  0 - 1 (%)    Basophils Absolute 0.0  0.0 - 0.1 (K/uL)   CARDIAC PANEL(CRET KIN+CKTOT+MB+TROPI)     Status: Abnormal   Collection Time   12/11/11  8:35 PM      Component Value Range Comment   Total CK 348 (*) 7 - 177 (U/L)    CK, MB 2.8  0.3 - 4.0 (ng/mL)    Troponin I <0.30  <0.30 (ng/mL)    Relative Index 0.8  0.0 - 2.5    PROTIME-INR     Status: Normal   Collection Time   12/11/11  8:35 PM      Component Value Range Comment   Prothrombin Time 14.9  11.6 - 15.2 (seconds)    INR 1.15  0.00 - 1.49    TSH     Status: Normal   Collection Time   12/11/11  8:35 PM      Component Value Range Comment   TSH 2.547  0.350 - 4.500 (uIU/mL)  URINALYSIS, ROUTINE W REFLEX MICROSCOPIC     Status: Abnormal   Collection Time   12/11/11 10:03 PM      Component Value Range Comment   Color, Urine YELLOW  YELLOW     APPearance CLOUDY (*) CLEAR     Specific Gravity, Urine 1.012  1.005 - 1.030     pH 6.0  5.0 - 8.0     Glucose, UA NEGATIVE  NEGATIVE (mg/dL)    Hgb urine dipstick NEGATIVE  NEGATIVE     Bilirubin Urine NEGATIVE  NEGATIVE     Ketones, ur NEGATIVE  NEGATIVE (mg/dL)    Protein, ur NEGATIVE  NEGATIVE (mg/dL)    Urobilinogen, UA 0.2  0.0 - 1.0 (mg/dL)    Nitrite NEGATIVE  NEGATIVE     Leukocytes, UA TRACE (*) NEGATIVE    URINE MICROSCOPIC-ADD ON     Status: Normal   Collection Time   12/11/11 10:03 PM      Component Value Range Comment   Squamous Epithelial / LPF RARE  RARE     WBC, UA 0-2  <3 (WBC/hpf)    Bacteria, UA RARE  RARE    CARDIAC PANEL(CRET KIN+CKTOT+MB+TROPI)     Status: Abnormal   Collection Time   12/12/11  5:20 AM      Component Value Range Comment   Total CK 288 (*) 7 - 177 (U/L)    CK, MB 3.6  0.3 - 4.0 (ng/mL)    Troponin I 0.41 (*) <0.30 (ng/mL)    Relative Index 1.3  0.0 - 2.5    TSH     Status: Normal   Collection Time   12/12/11  5:20 AM      Component Value Range Comment   TSH 3.612  0.350 - 4.500 (uIU/mL)   BASIC METABOLIC PANEL     Status: Abnormal   Collection Time   12/12/11 12:54 PM      Component Value Range Comment   Sodium 133 (*) 135 - 145 (mEq/L)    Potassium 3.9  3.5 - 5.1 (mEq/L)    Chloride 98  96 - 112 (mEq/L)    CO2 27  19 - 32 (mEq/L)    Glucose, Bld 98  70 - 99 (mg/dL)    BUN 11  6 - 23 (mg/dL)    Creatinine, Ser 4.09  0.50 - 1.10 (mg/dL)    Calcium 8.5  8.4 - 10.5 (mg/dL)    GFR calc non Af Amer 80 (*) >90 (mL/min)    GFR calc Af Amer >90  >90 (mL/min)   CBC     Status: Abnormal   Collection Time   12/12/11 12:54 PM      Component Value Range Comment   WBC 7.3  4.0 - 10.5 (K/uL)    RBC 2.92 (*) 3.87 - 5.11 (MIL/uL)    Hemoglobin 8.7 (*) 12.0 - 15.0 (g/dL)    HCT  81.1 (*) 91.4 - 46.0 (%)    MCV 88.4  78.0 - 100.0 (fL)    MCH 29.8  26.0 - 34.0 (pg)    MCHC 33.7  30.0 - 36.0 (g/dL)    RDW 78.2  95.6 - 21.3 (%)    Platelets 330  150 - 400 (K/uL)   CARDIAC PANEL(CRET KIN+CKTOT+MB+TROPI)     Status: Abnormal   Collection Time   12/12/11 12:56 PM      Component Value Range Comment   Total CK 215 (*) 7 - 177 (U/L)    CK, MB  2.8  0.3 - 4.0 (ng/mL)    Troponin I <0.30  <0.30 (ng/mL)    Relative Index 1.3  0.0 - 2.5     Ct Angio Chest W/cm &/or Wo Cm  12/11/2011  *RADIOLOGY REPORT*  Clinical Data: Hypoxia.  CT ANGIOGRAPHY CHEST  Technique:  Multidetector CT imaging of the chest using the standard protocol during bolus administration of intravenous contrast. Multiplanar reconstructed images including MIPs were obtained and reviewed to evaluate the vascular anatomy.  Contrast: OMNIPAQUE IOHEXOL 300 MG/ML IV SOLN  Comparison: Plain film chest 11/29/2011.  Findings: No pulmonary embolus is identified.  Heart size is normal.  No pleural or pericardial effusion.  No axillary, hilar or mediastinal lymphadenopathy.  Coronary and aortic atherosclerotic vascular disease is noted.  Lungs show only some mild dependent atelectatic change.  Incidentally imaged upper abdomen is unremarkable.  No focal bony abnormality.  IMPRESSION: Negative for pulmonary embolus.  No acute finding.  Original Report Authenticated By: Bernadene Bell. Maricela Curet, M.D.   Dg Chest Port 1 View  12/11/2011  *RADIOLOGY REPORT*  Clinical Data: Hypoxia.  PORTABLE CHEST - 1 VIEW  Comparison: Chest 11/29/2011.  Findings: Mild atelectasis or scarring is seen in the right lung base.  Lungs are otherwise clear.  Heart size is normal.  No pneumothorax or pleural effusion.  IMPRESSION: No acute disease.  Original Report Authenticated By: Bernadene Bell. D'ALESSIO, M.D.    ROS: General:no colds or fevers no awareness of tachycardia. Skin:no rashes or ulcers. HEENT:no blurred vision or double vision RU:EAVWUJ chest  pain and palpitations WJX:BJYNWG shortness of breath GI:no diarrhea constipation or melena GU:no dysuria NEURO:History of syncope but none recently MS:has a history of osteoarthritis in her left knee. Now with fracture and ORIF of her right humerus ENDO:history of diabetes remote history of thyroid disease   Blood pressure 119/50, pulse 88, temperature 98.6 F (37 C), temperature source Oral, resp. rate 14, height 5\' 3"  (1.6 m), weight 82.555 kg (182 lb), SpO2 95.00%. PE: General:alert and oriented x3, pleasant affect.no acute distress. HEENT:normocephalic, sclera clear. Skin:warm and dry brisk capillary refill. Right arm wrapped with Ace Neck:supple no JVD, no carotid bruits. Heart:S1-S2 regular rate and rhythm, no murmur gallop rub or click Lungs:clear without rales rhonchi or wheezes Abd: positive bowel sounds soft nontender NFA:OZHYQ arm wrapped in Ace right hand is swollen but fingers are warm and blanche. No lower extremity edema. Neuro:alert and oriented x3, follows commands.   Assessment/Plan 1) SVT- Narrow complex tachycardia. 2) Abnormal troponin.   PLAN:Tachycardia appears to be SVT.  ST depressions while tachycardic.  With conversion, she had very slight ST changes ant/lat. that were not present on EKG on the 16th.   No PE by CT scan. Agree with Lopressor. Serial cardiac enzymes.  Repeat cardiac enzymes normal.  It seems likely that the  one troponin that was abnormal may have been a combination a fast heart rate with anemia.  Subesquent troponin has been normal.  Echo today showed no focal wall motion abnormalities.  Doubt that this is a plaque rupture MI.  Consider stress  myoview after she has recovered from her broken arm and surgery.    Would not use heparin for this , especially given recent surgery. Increase beta blocker dose if tachycardia recurs.  SVT is likely related to the stress of her fracture, surgery and anemia.    Quanetta Truss S. 12/12/2011, 2:48  PM

## 2011-12-12 NOTE — Progress Notes (Signed)
PT Cancellation Note  _x__Treatment cancelled today due to medical issues with patient which prohibited  PT therapy  ___ Treatment cancelled today due to patient receiving procedure or test   ___ Treatment cancelled today due to patient's refusal to participate   ___ Treatment cancelled today due to

## 2011-12-12 NOTE — Progress Notes (Signed)
CRITICAL VALUE ALERT  Critical value received:  Troponin I  Date of notification:  12/12/2011  Time of notification:  0628  Critical value read back:yes  Nurse who received alert:  Guy Franco, RN  MD notified (1st page):  Nada Boozer, NP @[facility]   Time of first page:  [phone]  MD notified (2nd page):  Time of second page:  Responding MD:  Nada Boozer, NP @ [facility]  Time MD responded: [phone]

## 2011-12-12 NOTE — Progress Notes (Signed)
ANTICOAGULATION CONSULT NOTE - Initial Consult  Pharmacy Consult for IV heparin Indication:  ACS/STEMI  Allergies  Allergen Reactions  . Sulfa Antibiotics Hives, Diarrhea and Nausea And Vomiting    Facial swelling    Patient Measurements: Height: 5\' 3"  (160 cm) Weight: 182 lb (82.555 kg) IBW/kg (Calculated) : 52.4  Heparin dosing weight:  70.6 kg  Vital Signs: Temp: 97.5 F (36.4 C) (01/21 0400) Temp src: Oral (01/21 0400) BP: 125/48 mmHg (01/21 0445) Pulse Rate: 75  (01/21 0445)  Labs:  Alvira Philips 12/12/11 0520 12/11/11 2035  HGB -- 9.5*  HCT -- 28.5*  PLT -- 293  APTT -- --  LABPROT -- 14.9  INR -- 1.15  HEPARINUNFRC -- --  CREATININE -- 0.86  CKTOTAL 288* 348*  CKMB 3.6 2.8  TROPONINI 0.41* <0.30   Estimated Creatinine Clearance: 55.8 ml/min (by C-G formula based on Cr of 0.86).  Medical History: Past Medical History  Diagnosis Date  . Osteoporosis   . Hyperlipemia   . Diabetes mellitus     no mes, diet only     Medications:  Scheduled:    . aspirin  325 mg Oral Once  . B-complex with vitamin C  1 tablet Oral Daily  . cholecalciferol  2,000 Units Oral BID  . docusate sodium  100 mg Oral BID  . HYDROmorphone PCA 0.3 mg/mL   Intravenous Q4H  . magic mouthwash  5 mL Oral QID  . metoprolol tartrate  25 mg Oral BID  . Mirabegron ER  25 mg Oral QHS  . mulitivitamin with minerals  1 tablet Oral Daily  . omega-3 acid ethyl esters  1 g Oral BID  . simvastatin  20 mg Oral q1800  . vitamin C  1,000 mg Oral Daily  . DISCONTD:  ceFAZolin (ANCEF) IV  1 g Intravenous Q8H  . DISCONTD: enoxaparin  30 mg Subcutaneous Q12H   Infusions:    . 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 Transferred to ICU-SD 1/20 for tachycardia, hypotension and hypoxia.  CT negative for  PE  Cardiology on board; noted ST changes on EKG and ordered for IV heparin if okay with ortho.  Dr. Melvyn Novas from orthopedics contacted and is okay to start IV heparin.  Heparin dosing weight = 70.6 kg (IBW >25% of TBW)  Baseline INR wnl, Hgb 9.5 on POD#3.   Goal of Therapy:  Heparin level 0.3-0.7 units/ml   Plan:   Heparin 3500 units x 1 as bolus followed by heparin drip at 900 units/hr  Heparin level 8 hours after initiation of heparin drip  Daily heparin level and CBC while on heparin  Geoffry Paradise Thi 12/12/2011,7:24 AM

## 2011-12-12 NOTE — Progress Notes (Addendum)
Occupational Therapy Treatment Patient Details Name: [name] MRN: [id number] DOB: 1932-06-02 Today's Date: 12/12/2011 15:08-15:45  2ta,1te OT Assessment/Plan OT Assessment/Plan Comments on Treatment Session: Pt with limited activity this afternoon, would only tolerate brief standing for 30 seconds before requestiing to continue transferring to bedside chair.  Pt with increased edema in right hand performed some retrograde massage to dorsal aspect of hand and encouraged pt to continue AROM in the digit every hour to help with edema.  Pt currently withonly approximately 50 % of normal digit flexion secondary to edema.  Pt also reporting pain at PIP of index finger with flexion. Pt unable to tolerate having shoulder elevated  at this time for positioning.  Will need SNF for rehab secondary to living alone. OT Plan: Discharge plan remains appropriate Follow Up Recommendations: Skilled nursing facility Equipment Recommended: Defer to next venue OT Goals ADL Goals ADL Goal: Toilet Transfer - Progress: Progressing toward goals Arm Goals Arm Goal: AROM - Progress: Progressing toward goal  OT Treatment Precautions/Restrictions  Precautions Precautions: Fall Required Braces or Orthoses: Yes Other Brace/Splint: Cast on  RUE Restrictions Weight Bearing Restrictions: Yes RUE Weight Bearing: Non weight bearing Other Position/Activity Restrictions: Keep elevated as much as possible.   ADL ADL Toilet Transfer: Simulated;Minimal assistance (Min guard ) Toilet Transfer Method: Ambulating (ambulated 3 feet) Toilet Transfer Equipment: Other (comment) (Pt agreed to transfer to chair, but declined need to toilet.) Ambulation Related to ADLs: Min guard assist to take 3-4 steps to bedside chair. ADL Comments: Pt would only tolerate bed to chair transfers and 1 set of AAROM digit flexion and extension.  Encouraged pt to continue perfroming AROM to right hand every hour to assist with edema control.   Also performed retrograde massage to dorsum of the hand. Mobility  Bed Mobility Bed Mobility: Yes Supine to Sit: 4: Min assist;HOB elevated (Comment degrees) Supine to Sit Details (indicate cue type and reason): HOB elevated to approximately 45 degrees.  Therapist assisited with transition to sitting by placing with help at left shoulder. Sitting - Scoot to Edge of Bed: 5: Supervision Transfers Transfers: Yes Sit to Stand: 4: Min assist;Without upper extremity assist;From bed Sit to Stand Details (indicate cue type and reason): min guard assist Exercises General Exercises - Upper Extremity Digit Composite Flexion: AAROM;Right;15 reps;Seated Composite Extension: AROM;Right;15 reps;Seated  End of Session OT - End of Session Activity Tolerance: Patient limited by pain Patient left: in chair;with call bell in reach Nurse Communication: Mobility status for transfers General Behavior During Session: [facility] for tasks performed Cognition: [facility] for tasks performed  Alfonse Garringer OTR/L 12/12/2011, 4:20 PM Pager number [phone]

## 2011-12-12 NOTE — Progress Notes (Signed)
RN aware of Critical value result : Troponin 0.41. On-call provider: Nada Boozer, NP made aware of lab results and stated she will notify the physician that will evaluate pt.  Provider aware of results of EKG. Pt is in no distress presently.

## 2011-12-12 NOTE — Consult Note (Signed)
Agree with the NP/PA-C note as written.  The patient was not seen or examined by me as she was seen overnight by our mid-level provider and requested to be seen by [facility], who follows her regularly. She is clinically stable and [facility] will see her this morning.  Chrystie Nose, MD Attending Cardiologist [facility]

## 2011-12-12 NOTE — Progress Notes (Signed)
Subjective: Pt c/o pain to right elbow. Events noted in chart regarding heart rate.  Objective: Vital signs in last 24 hours: Temp:  [97.5 F (36.4 C)-98.6 F (37 C)] 98.6 F (37 C) (01/21 1600) Pulse Rate:  [69-180] 78  (01/21 1549) Resp:  [10-18] 14  (01/21 1100) BP: (77-125)/(41-73) 119/50 mmHg (01/21 1200) SpO2:  [90 %-100 %] 94 % (01/21 1549)  Intake/Output from previous day: 01/20 0701 - 01/21 0700 In: 602.1 [P.O.:600; I.V.:2.1] Out: 1150 [Urine:1150] Intake/Output this shift: Total I/O In: 0  Out: 1600 [Urine:1600]   Basename 12/12/11 1254 12/11/11 2035  HGB 8.7* 9.5*    Basename 12/12/11 1254 12/11/11 2035  WBC 7.3 9.4  RBC 2.92* 3.19*  HCT 25.8* 28.5*  PLT 330 293    Basename 12/12/11 1254 12/11/11 2035  NA 133* 133*  K 3.9 4.0  CL 98 97  CO2 27 29  BUN 11 11  CREATININE 0.74 0.86  GLUCOSE 98 114*  CALCIUM 8.5 8.7    Basename 12/11/11 2035  LABPT --  INR 1.15   Right hand swollen: fingers warm well perfused able to cross digits. Mild swelling to hand. Splint in place.  Assessment/Plan: Right distal humerus fracture s/p orif  Appreciate cardiology input Off heparin Likely to floor tomorrow and begin working on placement with social work?    Sharma Covert 12/12/2011, 6:09 PM

## 2011-12-12 NOTE — Progress Notes (Signed)
  Echocardiogram 2D Echocardiogram has been performed.  [name] [facility] 12/12/2011, 1:46 PM

## 2011-12-13 ENCOUNTER — Encounter (HOSPITAL_COMMUNITY): Payer: Self-pay | Admitting: Internal Medicine

## 2011-12-13 LAB — BASIC METABOLIC PANEL
BUN: 10 mg/dL (ref 6–23)
CO2: 28 mEq/L (ref 19–32)
GFR calc non Af Amer: 78 mL/min — ABNORMAL LOW (ref >90)
Glucose, Bld: 148 mg/dL — ABNORMAL HIGH (ref 70–99)
Potassium: 3.5 mEq/L (ref 3.5–5.1)

## 2011-12-13 LAB — CBC
HCT: 25.3 % — ABNORMAL LOW (ref 36.0–46.0)
Hemoglobin: 8.4 g/dL — ABNORMAL LOW (ref 12.0–15.0)
MCH: 29.2 pg (ref 26.0–34.0)
MCHC: 33.2 g/dL (ref 30.0–36.0)
RBC: 2.88 MIL/uL — ABNORMAL LOW (ref 3.87–5.11)

## 2011-12-13 MED ORDER — SODIUM CHLORIDE 0.9 % IV SOLN
250.0000 mg | INTRAVENOUS | Status: DC
  Administered 2011-12-13 – 2011-12-15 (×2): 250 mg via INTRAVENOUS
  Filled 2011-12-13 (×3): qty 20

## 2011-12-13 NOTE — Consult Note (Signed)
Subjective: Patient denies any chest pain. No SOB. Patient states feels much better. C/O of RUE pain when she moves it. Wants to take a shower  Objective: Vital signs in last 24 hours: Filed Vitals:   12/12/11 2000 12/13/11 0000 12/13/11 0400 12/13/11 0800  BP: 120/46 132/49 116/40   Pulse: 83 81 70 77  Temp: 99 F (37.2 C) 99.3 F (37.4 C) 98.7 F (37.1 C) 98.3 F (36.8 C)  TempSrc: Oral Oral Oral Oral  Resp: 11 14 14 13   Height:      Weight:      SpO2: 95% 94% 94% 96%    Intake/Output Summary (Last 24 hours) at 12/13/11 0917 Last data filed at 12/13/11 0800  Gross per 24 hour  Intake   1252 ml  Output   2650 ml  Net  -1398 ml    Weight change:   General: Alert, awake, oriented x3, in no acute distress. HEENT: No bruits, no goiter. Heart: Regular rate and rhythm, without murmurs, rubs, gallops. Lungs: Clear to auscultation bilaterally in anterior lung fields Abdomen: Soft, nontender, nondistended, positive bowel sounds. Extremities: No clubbing cyanosis or edema with positive pedal pulses. Neuro: Grossly intact, nonfocal.    Lab Results:  Basename 12/13/11 0305 12/12/11 1254  NA 134* 133*  K 3.5 3.9  CL 99 98  CO2 28 27  GLUCOSE 148* 98  BUN 10 11  CREATININE 0.79 0.74  CALCIUM 8.6 8.5  MG -- --  PHOS -- --   No results found for this basename: AST:2,ALT:2,ALKPHOS:2,BILITOT:2,PROT:2,ALBUMIN:2 in the last 72 hours No results found for this basename: LIPASE:2,AMYLASE:2 in the last 72 hours  Basename 12/13/11 0305 12/12/11 1254 12/11/11 2035  WBC 7.1 7.3 --  NEUTROABS -- -- 6.1  HGB 8.4* 8.7* --  HCT 25.3* 25.8* --  MCV 87.8 88.4 --  PLT 332 330 --    Basename 12/12/11 1256 12/12/11 0520 12/11/11 2035  CKTOTAL 215* 288* 348*  CKMB 2.8 3.6 2.8  CKMBINDEX -- -- --  TROPONINI <0.30 0.41* <0.30   No components found with this basename: POCBNP:3 No results found for this basename: DDIMER:2 in the last 72 hours No results found for this basename:  HGBA1C:2 in the last 72 hours No results found for this basename: CHOL:2,HDL:2,LDLCALC:2,TRIG:2,CHOLHDL:2,LDLDIRECT:2 in the last 72 hours  Basename 12/12/11 0520  TSH 3.612  T4TOTAL --  T3FREE --  THYROIDAB --    Basename 12/12/11 1254  VITAMINB12 256  FOLATE 15.2  FERRITIN 530*  TIBC Not calculated due to Iron <10.  IRON <10*  RETICCTPCT --    Micro Results: Recent Results (from the past 240 hour(s))  SURGICAL PCR SCREEN     Status: Abnormal   Collection Time   12/07/11 12:52 PM      Component Value Range Status Comment   MRSA, PCR INVALID RESULTS, SPECIMEN SENT FOR CULTURE (*) NEGATIVE  Final    Staphylococcus aureus INVALID RESULTS, SPECIMEN SENT FOR CULTURE (*) NEGATIVE  Final   MRSA CULTURE     Status: Normal   Collection Time   12/07/11 12:52 PM      Component Value Range Status Comment   Specimen Description NOSE   Final    Special Requests NONE   Final    Culture     Final    Value: FEW STAPHYLOCOCCUS AUREUS     Note: NOMRSA   Report Status 12/09/2011 FINAL   Final     Studies/Results: Ct Angio Chest W/cm &/or Wo Cm  12/11/2011  *RADIOLOGY REPORT*  Clinical Data: Hypoxia.  CT ANGIOGRAPHY CHEST  Technique:  Multidetector CT imaging of the chest using the standard protocol during bolus administration of intravenous contrast. Multiplanar reconstructed images including MIPs were obtained and reviewed to evaluate the vascular anatomy.  Contrast: OMNIPAQUE IOHEXOL 300 MG/ML IV SOLN  Comparison: Plain film chest 11/29/2011.  Findings: No pulmonary embolus is identified.  Heart size is normal.  No pleural or pericardial effusion.  No axillary, hilar or mediastinal lymphadenopathy.  Coronary and aortic atherosclerotic vascular disease is noted.  Lungs show only some mild dependent atelectatic change.  Incidentally imaged upper abdomen is unremarkable.  No focal bony abnormality.  IMPRESSION: Negative for pulmonary embolus.  No acute finding.  Original Report Authenticated  By: Bernadene Bell. Maricela Curet, M.D.   Dg Chest Port 1 View  12/11/2011  *RADIOLOGY REPORT*  Clinical Data: Hypoxia.  PORTABLE CHEST - 1 VIEW  Comparison: Chest 11/29/2011.  Findings: Mild atelectasis or scarring is seen in the right lung base.  Lungs are otherwise clear.  Heart size is normal.  No pneumothorax or pleural effusion.  IMPRESSION: No acute disease.  Original Report Authenticated By: Bernadene Bell. Maricela Curet, M.D.    Medications:     . B-complex with vitamin C  1 tablet Oral Daily  . cholecalciferol  2,000 Units Oral BID  . docusate sodium  100 mg Oral BID  . HYDROmorphone PCA 0.3 mg/mL   Intravenous Q4H  . magic mouthwash  5 mL Oral QID  . metoprolol tartrate  25 mg Oral BID  . Mirabegron ER  25 mg Oral QHS  . mulitivitamin with minerals  1 tablet Oral Daily  . omega-3 acid ethyl esters  1 g Oral BID  . simvastatin  20 mg Oral q1800  . vitamin C  1,000 mg Oral Daily    Assessment: Principal Problem:  *Wide-complex tachycardia Active Problems:  Humerus fracture  Hypoxia  Hypotension  Anemia, iron deficiency   Plan: #1 SVT Now in NSR with occasional PVCs per telemetry. Ct chest negative for PE. BMET unremarkable. CBC with prob postop anemia. First set cardiac enzymes with elevated troponins. Serial cardiac enzymes negative. Continue ASA, metoprolol. May need stress test vs cath. Cardiology following. #2. Severe iron deficiency Anemia/postop anemia  No overt GI bleed. Hemoglobin has dropped from 11.0 on 11/29/2011 to 8.4 12/13/2011. Patient is reluctant to be transfused packed red blood cells. Patient states if hemoglobin drops below 7 we will accept transfusion. Will place patient on IV iron . Follow H/H. #3. Hypotension Likely secondary to #1. Resolved. #4.Humerus fracture- s/p ORIF. Per primary team.   LOS: 6 days   [facility] 12/13/2011, 9:17 AM

## 2011-12-13 NOTE — Progress Notes (Signed)
FL2 in shadow chart for MD signature. SNF search has been initiated in the [employer]. Will provide bed offers as received. Info provided to [employer] for prior approval. Will assist with d/c planning to SNF.

## 2011-12-14 LAB — BASIC METABOLIC PANEL
BUN: 8 mg/dL (ref 6–23)
CO2: 26 mEq/L (ref 19–32)
Chloride: 102 mEq/L (ref 96–112)
Creatinine, Ser: 0.8 mg/dL (ref 0.50–1.10)
GFR calc Af Amer: 80 mL/min — ABNORMAL LOW (ref >90)
Glucose, Bld: 119 mg/dL — ABNORMAL HIGH (ref 70–99)

## 2011-12-14 NOTE — Progress Notes (Signed)
Patient ID: [name], female   DOB: 15-Jun-1932, 78 y.o.   MRN: [id number]  Subjective: No events overnight. Patient denies chest pain, shortness of breath, abdominal pain.  Objective:  Vital signs in last 24 hours:  Filed Vitals:   12/13/11 2000 12/14/11 0030 12/14/11 0511 12/14/11 1352  BP: 133/57 129/51 125/70 114/71  Pulse: 80 72 71 72  Temp: 98.6 F (37 C) 99.7 F (37.6 C) 98.5 F (36.9 C) 97.9 F (36.6 C)  TempSrc: Oral Oral Oral Oral  Resp: 20 16 20 16   Height:  5\' 3"  (1.6 m)    Weight:  78.563 kg (173 lb 3.2 oz)    SpO2: 95% 97% 96% 96%    Intake/Output from previous day:   Intake/Output Summary (Last 24 hours) at 12/14/11 1745 Last data filed at 12/14/11 1200  Gross per 24 hour  Intake   1260 ml  Output      0 ml  Net   1260 ml    Physical Exam: General: Alert, awake, oriented x3, in no acute distress. HEENT: No bruits, no goiter. Moist mucous membranes, no scleral icterus, no conjunctival pallor. Heart: Regular rate and rhythm, S1/S2 +, no murmurs, rubs, gallops. Lungs: Clear to auscultation bilaterally. No wheezing, no rhonchi, no rales.  Abdomen: Soft, nontender, nondistended, positive bowel sounds. Extremities: No clubbing or cyanosis, no pitting edema,  positive pedal pulses. Neuro: Grossly nonfocal.  Lab Results:  Basic Metabolic Panel:    Component Value Date/Time   NA 135 12/14/2011 0504   K 3.8 12/14/2011 0504   CL 102 12/14/2011 0504   CO2 26 12/14/2011 0504   BUN 8 12/14/2011 0504   CREATININE 0.80 12/14/2011 0504   GLUCOSE 119* 12/14/2011 0504   CALCIUM 9.0 12/14/2011 0504   CBC:    Component Value Date/Time   WBC 7.1 12/13/2011 0305   HGB 8.4* 12/13/2011 0305   HCT 25.3* 12/13/2011 0305   PLT 332 12/13/2011 0305   MCV 87.8 12/13/2011 0305   NEUTROABS 6.1 12/11/2011 2035   LYMPHSABS 2.1 12/11/2011 2035   MONOABS 1.0 12/11/2011 2035   EOSABS 0.2 12/11/2011 2035   BASOSABS 0.0 12/11/2011 2035      Lab 12/13/11 0305 12/12/11 1254  12/11/11 2035  WBC 7.1 7.3 9.4  HGB 8.4* 8.7* 9.5*  HCT 25.3* 25.8* 28.5*  PLT 332 330 293  MCV 87.8 88.4 89.3  MCH 29.2 29.8 29.8  MCHC 33.2 33.7 33.3  RDW 12.4 12.5 12.6  LYMPHSABS -- -- 2.1  MONOABS -- -- 1.0  EOSABS -- -- 0.2  BASOSABS -- -- 0.0  BANDABS -- -- --    Lab 12/14/11 0504 12/13/11 0305 12/12/11 1254 12/11/11 2035  NA 135 134* 133* 133*  K 3.8 3.5 3.9 4.0  CL 102 99 98 97  CO2 26 28 27 29   GLUCOSE 119* 148* 98 114*  BUN 8 10 11 11   CREATININE 0.80 0.79 0.74 0.86  CALCIUM 9.0 8.6 8.5 8.7  MG -- -- -- --    Lab 12/11/11 2035  INR 1.15  PROTIME --   Cardiac markers:  Lab 12/12/11 1256 12/12/11 0520 12/11/11 2035  CKMB 2.8 3.6 2.8  TROPONINI <0.30 0.41* <0.30  MYOGLOBIN -- -- --   No components found with this basename: POCBNP:3 Recent Results (from the past 240 hour(s))  SURGICAL PCR SCREEN     Status: Abnormal   Collection Time   12/07/11 12:52 PM      Component Value Range Status Comment  MRSA, PCR INVALID RESULTS, SPECIMEN SENT FOR CULTURE (*) NEGATIVE  Final    Staphylococcus aureus INVALID RESULTS, SPECIMEN SENT FOR CULTURE (*) NEGATIVE  Final   MRSA CULTURE     Status: Normal   Collection Time   12/07/11 12:52 PM      Component Value Range Status Comment   Specimen Description NOSE   Final    Special Requests NONE   Final    Culture     Final    Value: FEW STAPHYLOCOCCUS AUREUS     Note: NOMRSA   Report Status 12/09/2011 FINAL   Final     Studies/Results: No results found.  Medications: Scheduled Meds:   . B-complex with vitamin C  1 tablet Oral Daily  . cholecalciferol  2,000 Units Oral BID  . docusate sodium  100 mg Oral BID  . ferric gluconate (FERRLECIT/NULECIT) IV  250 mg Intravenous QODAY  . magic mouthwash  5 mL Oral QID  . metoprolol tartrate  25 mg Oral BID  . Mirabegron ER  25 mg Oral QHS  . mulitivitamin with minerals  1 tablet Oral Daily  . omega-3 acid ethyl esters  1 g Oral BID  . simvastatin  20 mg Oral q1800  .  vitamin C  1,000 mg Oral Daily   Continuous Infusions:   . dextrose 5 % and 0.45 % NaCl with KCl 20 mEq/L 50 mL/hr (12/12/11 2054)   PRN Meds:.acetaminophen-codeine, ALPRAZolam, chlorproMAZINE, diphenhydrAMINE, diphenhydrAMINE, diphenhydrAMINE, diphenhydrAMINE, diphenhydrAMINE, HYDROcodone-acetaminophen, HYDROmorphone, HYDROmorphone (DILAUDID) injection, methocarbamol(ROBAXIN) IV, methocarbamol, naloxone, naloxone, ondansetron (ZOFRAN) IV, ondansetron (ZOFRAN) IV, ondansetron (ZOFRAN) IV, ondansetron, oxyCODONE, sodium chloride, sodium chloride, zolpidem, DISCONTD: hydrogen peroxide  Assessment/Plan:  Principal Problem:  #1 SVT  Now in NSR with occasional PVCs per telemetry. Ct chest negative for PE. BMET unremarkable. CBC with prob postop anemia. Serial cardiac enzymes negative. Continue ASA, metoprolol. May need stress test vs cath. Cardiology following.   #2. Severe iron deficiency Anemia/postop anemia  No overt GI bleed. Hemoglobin has dropped from 11.0 on 11/29/2011 to 8.3 12/14/2011. Continue IV iron . Follow H/H.   #3. Hypotension  Likely secondary to #1. Resolved.   #4.Humerus fracture- s/p ORIF. Per primary team.   EDUCATION - test results and diagnostic studies were discussed with patient  - patient verbalized the understanding - questions were answered at the bedside and contact information was provided for additional questions or concerns   LOS: 7 days   MAGICK-Verdell Kincannon 12/14/2011, 5:45 PM  [facility] Pager: [phone]

## 2011-12-14 NOTE — Progress Notes (Signed)
Patient c/o pain in pinky finger on right hand were splint and wrap has been applied because of injury to right humerus, right hand very swollen, ortho tech called to assess patient discomfort and requested that the Nurse call the doctor to get ok for Ortho tech to make adjustments to splint and wrap. Dr. Truitt Merle and informed Nurse that he did not want the splint and wrap adjusted at this time and that he would be over after surgery to assess the patient himself, patient in stable condition at this time

## 2011-12-14 NOTE — Progress Notes (Signed)
Physical Therapy Treatment Patient Details Name: LIVANA YERIAN MRN: [id number] DOB: 04-07-1932 Today's Date: 12/14/2011 Time: 4696-2952 Charge: Leonia Reeves PT Assessment/Plan  PT - Assessment/Plan Comments on Treatment Session: Pt pleasant and cooperative.  Pt reported increased pain upon arrival and RN gave meds during ambulation.  Pt pain 3/10 upon elevating R UE when returned to chair.  Ortho tech notified of pt bandaging and increased swelling of R hand.  Discussed performing open and closing hand with assist of L hand if needed to help with edema control and continuing to elevate R UE. PT Plan: Discharge plan remains appropriate Follow Up Recommendations: Skilled nursing facility Equipment Recommended: Defer to next venue PT Goals  Acute Rehab PT Goals PT Goal: Sit to Stand - Progress: Progressing toward goal PT Goal: Stand to Sit - Progress: Progressing toward goal PT Goal: Ambulate - Progress: Progressing toward goal  PT Treatment Precautions/Restrictions  Precautions Precautions: Fall Required Braces or Orthoses: Yes Other Brace/Splint: cast R UE Restrictions Weight Bearing Restrictions: Yes RUE Weight Bearing: Non weight bearing Other Position/Activity Restrictions: Elevate R UE Mobility (including Balance) Bed Mobility Bed Mobility: No (pt sitting in chair on arrival) Transfers Transfers: Yes Sit to Stand: 4: Min assist;From toilet;From chair/3-in-1 Sit to Stand Details (indicate cue type and reason): assist from low toilet, otherwise min/guard, with pt holding R UE with L hand Stand to Sit: To chair/3-in-1;To toilet;4: Min assist Stand to Sit Details: min/guard, slow descent 2* pt not using UEs to assist, L hand supporting R UE Ambulation/Gait Ambulation/Gait: Yes Ambulation/Gait Assistance: 4: Min assist Ambulation/Gait Assistance Details (indicate cue type and reason): assist for occasional LOB 2* pt supporting R UE with L hand, RN gave IV pain meds half way so ambulated  back to room Ambulation Distance (Feet): 40 Feet (x2) Assistive device: None Gait Pattern: Decreased trunk rotation (2* R UE requiring support from L hand) Gait velocity: slow    Exercise    End of Session PT - End of Session Equipment Utilized During Treatment: Gait belt Activity Tolerance: Patient limited by pain Patient left: in chair;with call bell in reach General Behavior During Session: [facility] for tasks performed Cognition: [facility] for tasks performed  Annjeanette Sarwar,KATHrine E 12/14/2011, 12:30 PM Pager: [phone]

## 2011-12-14 NOTE — Progress Notes (Signed)
Subjective: Pt doing better, no acute concerns today +bm Voiding well, tolerating po diet.   Objective: Vital signs in last 24 hours: Temp:  [97.9 F (36.6 C)-99.7 F (37.6 C)] 97.9 F (36.6 C) (01/23 1352) Pulse Rate:  [71-80] 72  (01/23 1352) Resp:  [16-20] 16  (01/23 1352) BP: (114-133)/(47-71) 114/71 mmHg (01/23 1352) SpO2:  [95 %-100 %] 96 % (01/23 1352) Weight:  [78.563 kg (173 lb 3.2 oz)] 78.563 kg (173 lb 3.2 oz) (01/23 0030)  Intake/Output from previous day: 01/22 0701 - 01/23 0700 In: 870 [I.V.:750; IV Piggyback:120] Out: -  Intake/Output this shift:     Basename 12/13/11 0305 12/12/11 1254 12/11/11 2035  HGB 8.4* 8.7* 9.5*    Basename 12/13/11 0305 12/12/11 1254  WBC 7.1 7.3  RBC 2.88* 2.92*  HCT 25.3* 25.8*  PLT 332 330    Basename 12/14/11 0504 12/13/11 0305  NA 135 134*  K 3.8 3.5  CL 102 99  CO2 26 28  BUN 8 10  CREATININE 0.80 0.79  GLUCOSE 119* 148*  CALCIUM 9.0 8.6    Basename 12/11/11 2035  LABPT --  INR 1.15   Right hand: swollen able to flex and extend digits, fingers warm well perfused Good cap refil  Assessment/Plan: Right distal humerus fracture  Continue inpt care Await placement with sw Likely skilled nursing facility   [facility] 12/14/2011, 3:54 PM

## 2011-12-14 NOTE — Progress Notes (Signed)
CSW has provided SNF bed offers to pt . Pt will have family/friends tour facilities and inform CSW of bed choice. CSW will submit updated PT notes to insurance once notes are available.

## 2011-12-15 LAB — GLUCOSE, CAPILLARY
Glucose-Capillary: 104 mg/dL — ABNORMAL HIGH (ref 70–99)
Glucose-Capillary: 164 mg/dL — ABNORMAL HIGH (ref 70–99)

## 2011-12-15 LAB — CBC
HCT: 27.4 % — ABNORMAL LOW (ref 36.0–46.0)
Hemoglobin: 9 g/dL — ABNORMAL LOW (ref 12.0–15.0)
MCH: 29 pg (ref 26.0–34.0)
MCHC: 32.8 g/dL (ref 30.0–36.0)
MCV: 88.4 fL (ref 78.0–100.0)
Platelets: 436 K/uL — ABNORMAL HIGH (ref 150–400)
RBC: 3.1 MIL/uL — ABNORMAL LOW (ref 3.87–5.11)
RDW: 12.9 % (ref 11.5–15.5)
WBC: 8.4 K/uL (ref 4.0–10.5)

## 2011-12-15 LAB — BASIC METABOLIC PANEL WITH GFR
BUN: 11 mg/dL (ref 6–23)
CO2: 27 meq/L (ref 19–32)
Calcium: 9 mg/dL (ref 8.4–10.5)
Chloride: 105 meq/L (ref 96–112)
Creatinine, Ser: 0.76 mg/dL (ref 0.50–1.10)
GFR calc Af Amer: >90 mL/min (ref >90)
GFR calc non Af Amer: 79 mL/min — ABNORMAL LOW (ref >90)
Glucose, Bld: 112 mg/dL — ABNORMAL HIGH (ref 70–99)
Potassium: 4.1 meq/L (ref 3.5–5.1)
Sodium: 138 meq/L (ref 135–145)

## 2011-12-15 MED ORDER — ASCORBIC ACID 1000 MG PO TABS: 1000.0000 mg | ORAL_TABLET | Freq: Every day | ORAL | Status: AC

## 2011-12-15 MED ORDER — DSS 100 MG PO CAPS: 100.0000 mg | ORAL_CAPSULE | Freq: Two times a day (BID) | ORAL | Status: AC

## 2011-12-15 MED ORDER — METOPROLOL TARTRATE 25 MG PO TABS: 25.0000 mg | ORAL_TABLET | Freq: Two times a day (BID) | ORAL | Status: DC

## 2011-12-15 MED ORDER — HYDROCODONE-ACETAMINOPHEN 5-325 MG PO TABS: 1.0000 | ORAL_TABLET | ORAL | Status: AC | PRN

## 2011-12-15 NOTE — Progress Notes (Signed)
Pt evaluated for long term disease management services with [facility] Management program as a benefit of [employer]. RN case manager will do a post d/c transition of care call and home visits for assessments for DM, hyperlipidemia and other needs when the pt discharges from the SNF for short term rehab.   Brooke Bonito C. Roena Malady, RN, MS, CCM [facility] Liaison [facility] [phone]

## 2011-12-15 NOTE — Progress Notes (Signed)
ST SNF bed available at [facility]. [employer] will authorize admission and provide amb authorization in the AM. CSW will assist with d/c planning to SNF.

## 2011-12-15 NOTE — Discharge Summary (Signed)
Physician Discharge Summary  Patient ID: Monay Houlton Rissmiller MRN: [id number] DOB/AGE: 76/08/1932 76 y.o.  Admit date: 12/07/2011 Discharge date: 12/16/2011  Admission Diagnoses: fracture distal right humerus Past Medical History  Diagnosis Date  . Osteoporosis   . Hyperlipemia   . Diabetes mellitus     no mes, diet only   . Anemia, iron deficiency 12/12/2011    Discharge Diagnoses:  Right distal humerus fracture Principal Problem:  *Wide-complex tachycardia Active Problems:  Humerus fracture  Hypoxia  Hypotension  Anemia, iron deficiency   Surgeries: Procedure(s): OPEN REDUCTION INTERNAL FIXATION (ORIF) DISTAL HUMERUS FRACTURE on 12/07/2011 - 12/09/2011    Consultants: Treatment Team:  Ramiro Harvest, MD Corky Crafts, MD  Discharged Condition: Improved  Hospital Course: Earlyn Sylvan Stimpson is an 78 y.o. female who was admitted 12/07/2011 with a chief complaint of No chief complaint on file. , and found to have a diagnosis of fracture distal right humerus.  They were brought to the operating room on 12/07/2011 - 12/09/2011 and underwent Procedure(s): OPEN REDUCTION INTERNAL FIXATION (ORIF) DISTAL HUMERUS FRACTURE.   SHE TOLERATED THE PROCEDURE WELL ON POD #2 SHE HAD A RUN OF SVT AND WAS TRANSFERRED TO STEP DOWN UNIT. NEG CARDIAC ENZYMES NEG W/U FOR PE O/W STABLE IN STEPDOWN SEEN BY CARDIOLOGY AND INTERNAL MEDICINE STAYED IN HOSPITAL FOR/AWAITING PLACEMENT  They were given perioperative antibiotics: Anti-infectives     Start     Dose/Rate Route Frequency Ordered Stop   12/10/11 0200   ceFAZolin (ANCEF) IVPB 1 g/50 mL premix  Status:  Discontinued        1 g 100 mL/hr over 30 Minutes Intravenous Every 8 hours 12/09/11 1844 12/11/11 1011   12/09/11 1845   ceFAZolin (ANCEF) IVPB 1 g/50 mL premix        1 g 100 mL/hr over 30 Minutes Intravenous NOW 12/09/11 1844 12/10/11 1845   12/07/11 1215   ceFAZolin (ANCEF) IVPB 2 g/50 mL premix  Status:  Discontinued        2 g 100  mL/hr over 30 Minutes Intravenous 60 min pre-op 12/07/11 1200 12/07/11 1908        .  They were given sequential compression devices, early ambulation, LOVENOX AND SCD'S for DVT prophylaxis.  Recent vital signs: Patient Vitals for the past 24 hrs:  BP Temp Temp src Pulse Resp SpO2  12/15/11 0920 130/60 mmHg - - 70  - -  12/15/11 0550 127/58 mmHg 98.7 F (37.1 C) Oral 67  16  97 %  12/14/11 2151 152/68 mmHg 99.3 F (37.4 C) Oral 85  14  96 %  .  Recent laboratory studies: No results found.  Discharge Medications:   Medication List  As of 12/15/2011  8:20 PM   ASK your doctor about these medications         acetaminophen-codeine 300-60 MG per tablet   Commonly known as: TYLENOL #4   Take 1-2 tablets by mouth every 4 (four) hours as needed.      aspirin EC 81 MG tablet   Take 81 mg by mouth daily.      b complex vitamins tablet   Take 1 tablet by mouth daily.      cholecalciferol 1000 UNITS tablet   Commonly known as: VITAMIN D   Take 2,000 Units by mouth 2 (two) times daily.      Cranberry Extract 200 MG Caps   Take 1 capsule by mouth 2 (two) times daily.      fish  oil-omega-3 fatty acids 1000 MG capsule   Take 1 g by mouth 2 (two) times daily.      meloxicam 15 MG tablet   Commonly known as: MOBIC   Take 15 mg by mouth at bedtime.      mulitivitamin with minerals Tabs   Take 1 tablet by mouth daily.      pravastatin 40 MG tablet   Commonly known as: PRAVACHOL   Take 40 mg by mouth daily.      PRESCRIPTION MEDICATION   Take 1 tablet by mouth at bedtime.      raloxifene 60 MG tablet   Commonly known as: EVISTA   Take 60 mg by mouth daily.      tolterodine 4 MG 24 hr capsule   Commonly known as: DETROL LA   Take 4 mg by mouth daily.            Diagnostic Studies: Dg Chest 1 View  11/29/2011  *RADIOLOGY REPORT*  Clinical Data: Preop elbow fracture  CHEST - 1 VIEW  Comparison: None.  Findings: Lungs are essentially clear, noting mild bibasilar  atelectasis. No pleural effusion or pneumothorax.  The heart is top normal in size.  IMPRESSION: No evidence of acute cardiopulmonary disease.  Original Report Authenticated By: Charline Bills, M.D.   Dg Elbow Complete Right  11/28/2011  *RADIOLOGY REPORT*  Clinical Data: Fall.  Right elbow pain.  RIGHT ELBOW - COMPLETE 3+ VIEW  Comparison: None.  Findings: There is a highly comminuted distal humerus fracture, with both radial and ulnar displacement of the epicondylar fragments.  Minimal anterior displacement of the distal humerus on the lateral view.  Fracture is predominately transverse in the metaphyseal region with sagittally oriented fracture planes extending into the trochlea and separating the epicondyles.  Small bony debris fragment is present adjacent to the radial neck. Radial head appears intact.  Olecranon appears intact with a small insertional triceps spur.  IMPRESSION: Comminuted moderately displaced intra-articular distal humerus fracture.  Original Report Authenticated By: Andreas Newport, M.D.   Ct Angio Chest W/cm &/or Wo Cm  12/11/2011  *RADIOLOGY REPORT*  Clinical Data: Hypoxia.  CT ANGIOGRAPHY CHEST  Technique:  Multidetector CT imaging of the chest using the standard protocol during bolus administration of intravenous contrast. Multiplanar reconstructed images including MIPs were obtained and reviewed to evaluate the vascular anatomy.  Contrast: OMNIPAQUE IOHEXOL 300 MG/ML IV SOLN  Comparison: Plain film chest 11/29/2011.  Findings: No pulmonary embolus is identified.  Heart size is normal.  No pleural or pericardial effusion.  No axillary, hilar or mediastinal lymphadenopathy.  Coronary and aortic atherosclerotic vascular disease is noted.  Lungs show only some mild dependent atelectatic change.  Incidentally imaged upper abdomen is unremarkable.  No focal bony abnormality.  IMPRESSION: Negative for pulmonary embolus.  No acute finding.  Original Report Authenticated By: Bernadene Bell.  Maricela Curet, M.D.   Dg Chest Port 1 View  12/11/2011  *RADIOLOGY REPORT*  Clinical Data: Hypoxia.  PORTABLE CHEST - 1 VIEW  Comparison: Chest 11/29/2011.  Findings: Mild atelectasis or scarring is seen in the right lung base.  Lungs are otherwise clear.  Heart size is normal.  No pneumothorax or pleural effusion.  IMPRESSION: No acute disease.  Original Report Authenticated By: Bernadene Bell. Maricela Curet, M.D.    They benefited maximally from their hospital stay and there were no complications.     Disposition: TO SKILLED NURSING FACILITY  RECOMMENDATIONS: CONTINUE WITH LONG ARM SPLINT AT ALL TIMES NO WEIGHT ON RIGHT  ELBOW OK TO MOVE AND USE FINGERS ORAL PAIN MEDICATIONS KEEP BANDAGE CLEAN AND DRY NEED TO F/U IN OFFICE ON 12/22/2010 FOR F/U APPT CALL OFFICE FOR APPOINTMENT  CONDITION AT D/C: GOOD     Signed: Sharma Covert 12/15/2011, 8:20 PM

## 2011-12-15 NOTE — Progress Notes (Addendum)
Patient ID: [name], female   DOB: Jun 14, 1932, 78 y.o.   MRN: [id number]  Subjective: No events overnight. Patient denies chest pain, shortness of breath, abdominal pain.  Objective:  Vital signs in last 24 hours:  Filed Vitals:   12/14/11 1352 12/14/11 2151 12/15/11 0550 12/15/11 0920  BP: 114/71 152/68 127/58 130/60  Pulse: 72 85 67 70  Temp: 97.9 F (36.6 C) 99.3 F (37.4 C) 98.7 F (37.1 C)   TempSrc: Oral Oral Oral   Resp: 16 14 16    Height:      Weight:      SpO2: 96% 96% 97%     Intake/Output from previous day:   Intake/Output Summary (Last 24 hours) at 12/15/11 1750 Last data filed at 12/15/11 1230  Gross per 24 hour  Intake   1980 ml  Output   1601 ml  Net    379 ml    Physical Exam: General: Alert, awake, oriented x3, in no acute distress. HEENT: No bruits, no goiter. Moist mucous membranes, no scleral icterus, no conjunctival pallor. Heart: Regular rate and rhythm, S1/S2 +, no murmurs, rubs, gallops. Lungs: Clear to auscultation bilaterally. No wheezing, no rhonchi, no rales.  Abdomen: Soft, nontender, nondistended, positive bowel sounds. Extremities: No clubbing or cyanosis, no pitting edema,  positive pedal pulses. Neuro: Grossly nonfocal.  Lab Results:  Basic Metabolic Panel:    Component Value Date/Time   NA 138 12/15/2011 0500   K 4.1 12/15/2011 0500   CL 105 12/15/2011 0500   CO2 27 12/15/2011 0500   BUN 11 12/15/2011 0500   CREATININE 0.76 12/15/2011 0500   GLUCOSE 112* 12/15/2011 0500   CALCIUM 9.0 12/15/2011 0500   CBC:    Component Value Date/Time   WBC 8.4 12/15/2011 0500   HGB 9.0* 12/15/2011 0500   HCT 27.4* 12/15/2011 0500   PLT 436* 12/15/2011 0500   MCV 88.4 12/15/2011 0500   NEUTROABS 6.1 12/11/2011 2035   LYMPHSABS 2.1 12/11/2011 2035   MONOABS 1.0 12/11/2011 2035   EOSABS 0.2 12/11/2011 2035   BASOSABS 0.0 12/11/2011 2035      Lab 12/15/11 0500 12/13/11 0305 12/12/11 1254 12/11/11 2035  WBC 8.4 7.1 7.3 9.4  HGB 9.0* 8.4*  8.7* 9.5*  HCT 27.4* 25.3* 25.8* 28.5*  PLT 436* 332 330 293  MCV 88.4 87.8 88.4 89.3  MCH 29.0 29.2 29.8 29.8  MCHC 32.8 33.2 33.7 33.3  RDW 12.9 12.4 12.5 12.6  LYMPHSABS -- -- -- 2.1  MONOABS -- -- -- 1.0  EOSABS -- -- -- 0.2  BASOSABS -- -- -- 0.0  BANDABS -- -- -- --    Lab 12/15/11 0500 12/14/11 0504 12/13/11 0305 12/12/11 1254 12/11/11 2035  NA 138 135 134* 133* 133*  K 4.1 3.8 3.5 3.9 4.0  CL 105 102 99 98 97  CO2 27 26 28 27 29   GLUCOSE 112* 119* 148* 98 114*  BUN 11 8 10 11 11   CREATININE 0.76 0.80 0.79 0.74 0.86  CALCIUM 9.0 9.0 8.6 8.5 8.7  MG -- -- -- -- --    Lab 12/11/11 2035  INR 1.15  PROTIME --   Cardiac markers:  Lab 12/12/11 1256 12/12/11 0520 12/11/11 2035  CKMB 2.8 3.6 2.8  TROPONINI <0.30 0.41* <0.30  MYOGLOBIN -- -- --   No components found with this basename: POCBNP:3 Recent Results (from the past 240 hour(s))  SURGICAL PCR SCREEN     Status: Abnormal   Collection Time  12/07/11 12:52 PM      Component Value Range Status Comment   MRSA, PCR INVALID RESULTS, SPECIMEN SENT FOR CULTURE (*) NEGATIVE  Final    Staphylococcus aureus INVALID RESULTS, SPECIMEN SENT FOR CULTURE (*) NEGATIVE  Final   MRSA CULTURE     Status: Normal   Collection Time   12/07/11 12:52 PM      Component Value Range Status Comment   Specimen Description NOSE   Final    Special Requests NONE   Final    Culture     Final    Value: FEW STAPHYLOCOCCUS AUREUS     Note: NOMRSA   Report Status 12/09/2011 FINAL   Final     Studies/Results: No results found.  Medications: Scheduled Meds:   . B-complex with vitamin C  1 tablet Oral Daily  . cholecalciferol  2,000 Units Oral BID  . docusate sodium  100 mg Oral BID  . ferric gluconate (FERRLECIT/NULECIT) IV  250 mg Intravenous QODAY  . magic mouthwash  5 mL Oral QID  . metoprolol tartrate  25 mg Oral BID  . Mirabegron ER  25 mg Oral QHS  . mulitivitamin with minerals  1 tablet Oral Daily  . omega-3 acid ethyl esters   1 g Oral BID  . simvastatin  20 mg Oral q1800  . vitamin C  1,000 mg Oral Daily   Continuous Infusions:   . dextrose 5 % and 0.45 % NaCl with KCl 20 mEq/L 50 mL/hr at 12/15/11 0638   PRN Meds:.acetaminophen-codeine, ALPRAZolam, chlorproMAZINE, diphenhydrAMINE, HYDROcodone-acetaminophen, HYDROmorphone, HYDROmorphone (DILAUDID) injection, methocarbamol(ROBAXIN) IV, methocarbamol, ondansetron (ZOFRAN) IV, ondansetron, oxyCODONE, zolpidem, DISCONTD: diphenhydrAMINE, DISCONTD: diphenhydrAMINE, DISCONTD: diphenhydrAMINE, DISCONTD: diphenhydrAMINE, DISCONTD: naloxone, DISCONTD: naloxone, DISCONTD: ondansetron (ZOFRAN) IV DISCONTD: ondansetron (ZOFRAN) IV, DISCONTD: sodium chloride, DISCONTD: sodium chloride  Assessment/Plan:  #1 SVT  - now in NSR - CT chest negative for PE. BMET unremarkable. CBC with prob postop anemia. Serial cardiac enzymes negative - Continue ASA, metoprolol  #2. Severe iron deficiency Anemia/postop anemia  - no overt GI bleed. - Hemoglobin stable and at pt's baseline - continue iron  #3. Hypotension  - now stable  - continue to monitor per floor protocol  #4.Humerus fracture - s/p ORIF. Per primary team.   EDUCATION  - test results and diagnostic studies were discussed with patient  - patient verbalized the understanding  - questions were answered at the bedside and contact information was provided for additional questions or concerns  Internal medicine team will sign off Please call us for questions  [phone]   LOS: 8 days   MAGICK-Shakaya Bhullar 12/15/2011, 5:50 PM  [facility] Pager: [phone]

## 2011-12-16 LAB — BASIC METABOLIC PANEL
BUN: 10 mg/dL (ref 6–23)
CO2: 24 mEq/L (ref 19–32)
Chloride: 104 mEq/L (ref 96–112)
Creatinine, Ser: 0.77 mg/dL (ref 0.50–1.10)
GFR calc Af Amer: >90 mL/min (ref >90)
Potassium: 4 mEq/L (ref 3.5–5.1)

## 2011-12-16 LAB — CBC
HCT: 28.8 % — ABNORMAL LOW (ref 36.0–46.0)
MCV: 88.9 fL (ref 78.0–100.0)
RBC: 3.24 MIL/uL — ABNORMAL LOW (ref 3.87–5.11)
RDW: 13.2 % (ref 11.5–15.5)
WBC: 9.7 10*3/uL (ref 4.0–10.5)

## 2011-12-16 LAB — GLUCOSE, CAPILLARY: Glucose-Capillary: 111 mg/dL — ABNORMAL HIGH (ref 70–99)

## 2011-12-16 NOTE — Progress Notes (Signed)
Patient discharged to SNF, alert and oriented, discharge package to [facility] for delivery to SNF, patient verbalize understanding of discharge instructions, patient in stable condition at this time

## 2011-12-19 NOTE — Progress Notes (Signed)
Pt d/c to [facility] on 1/25 via [facility] for [facility] SNF placement. [employer] provided prior approval for SNF and Amb transport.

## 2011-12-20 ENCOUNTER — Encounter (HOSPITAL_COMMUNITY): Payer: Self-pay | Admitting: Orthopedic Surgery

## 2012-09-24 IMAGING — CT CT ANGIO CHEST
2 of 6 series · 19 of 36 positions shown · IV contrast (APPLIED)
Comparison: Plain film chest 11/29/2011.

CLINICAL DATA: Hypoxia.

CT ANGIOGRAPHY CHEST
TECHNIQUE: Multidetector CT imaging of the chest using the
standard protocol during bolus administration of intravenous
contrast. Multiplanar reconstructed images including MIPs were
obtained and reviewed to evaluate the vascular anatomy.
Contrast: 100mL OMNIPAQUE IOHEXOL 300 MG/ML IV SOLN

[Series 6: pe thins @ 1mm · axial · 0.74mm/px · z∈[-342,-116]mm · 18 of 251 slices shown]
[im 13/251  lung]
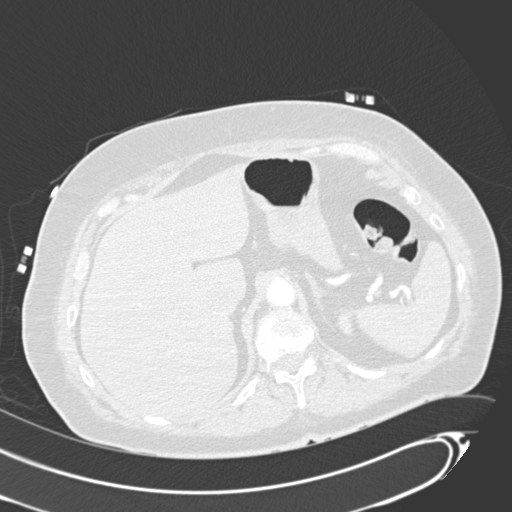
[im 26/251  mediastinal]
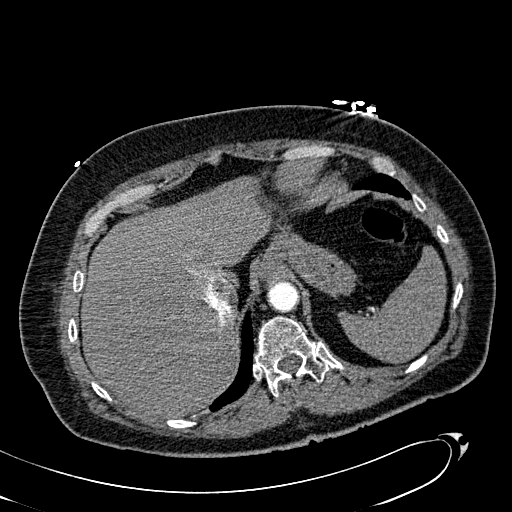
[im 38/251  lung]
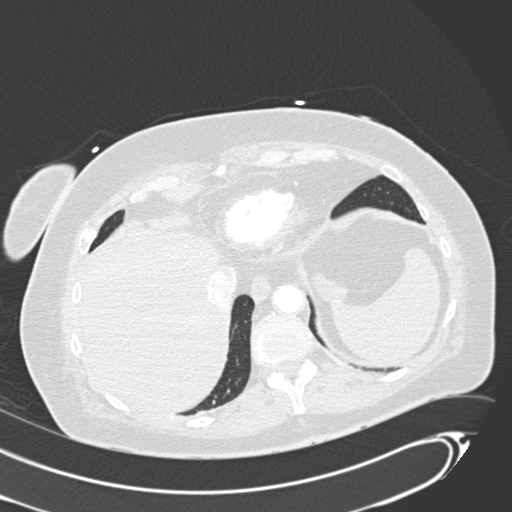
[im 51/251  mediastinal]
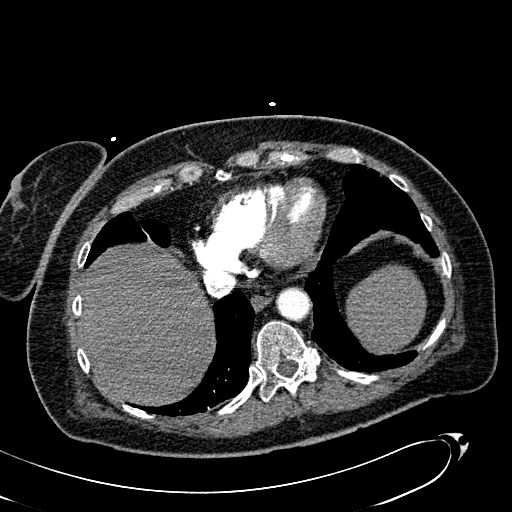
[im 63/251  lung]
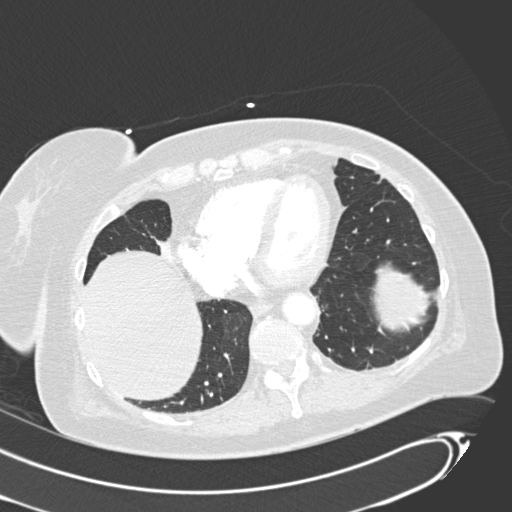
[im 76/251  mediastinal]
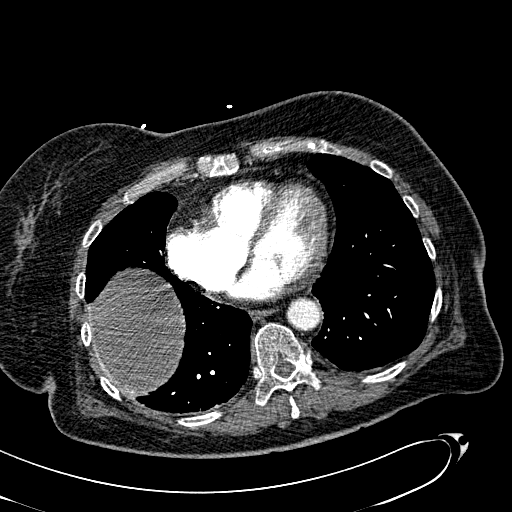
[im 88/251  lung]
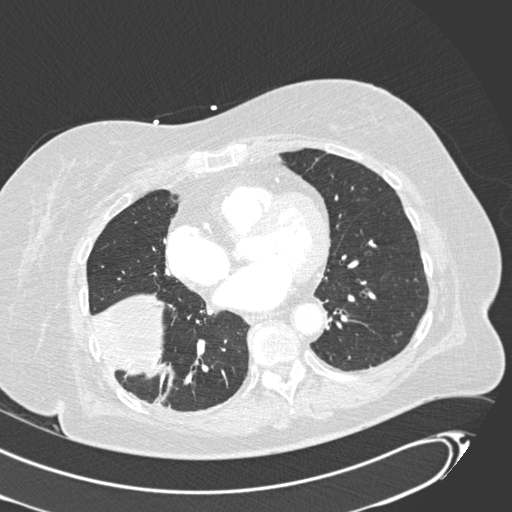
[im 101/251  mediastinal]
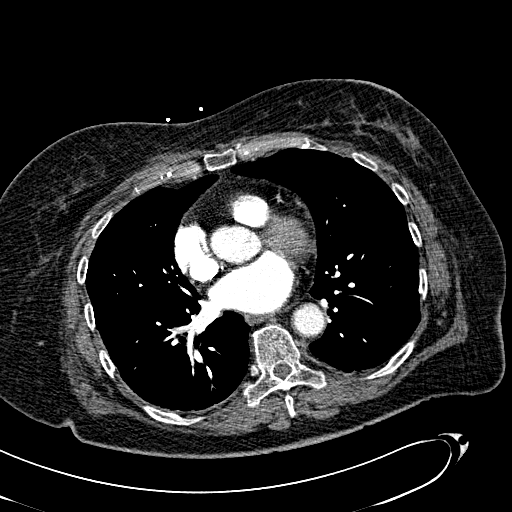
[im 113/251  lung]
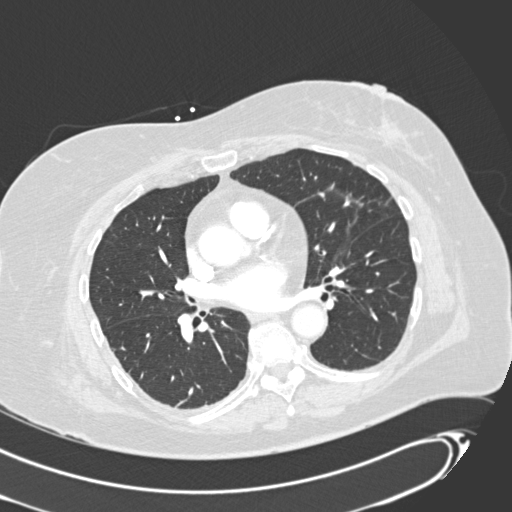
[im 138/251  mediastinal]
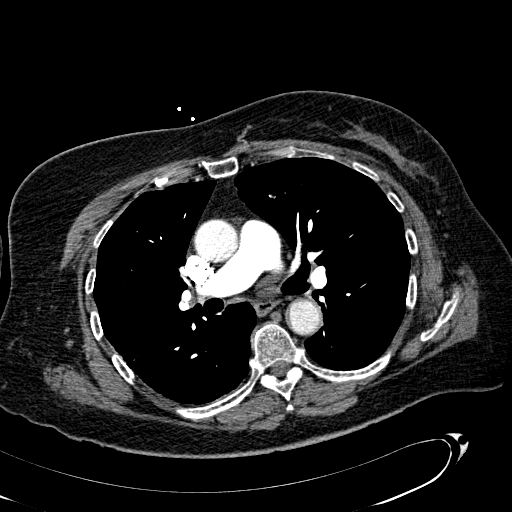
[im 151/251  lung]
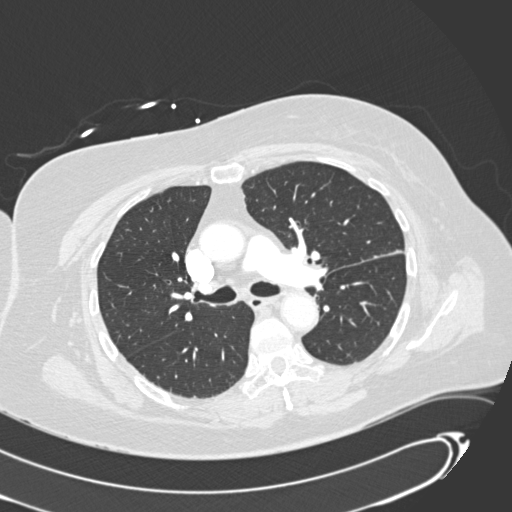
[im 163/251  mediastinal]
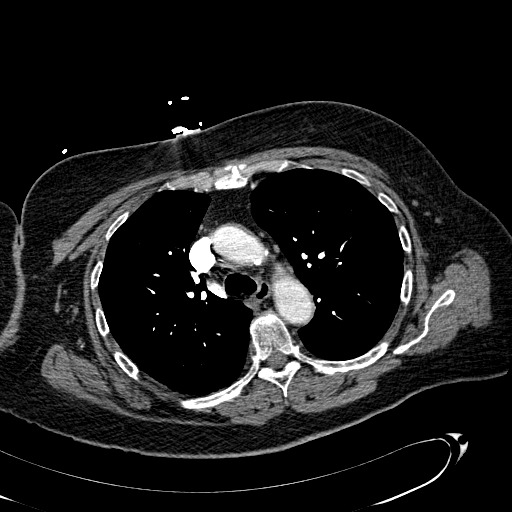
[im 176/251  lung]
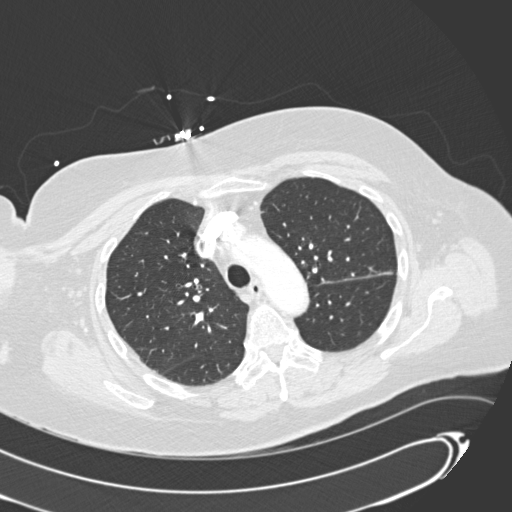
[im 188/251  mediastinal]
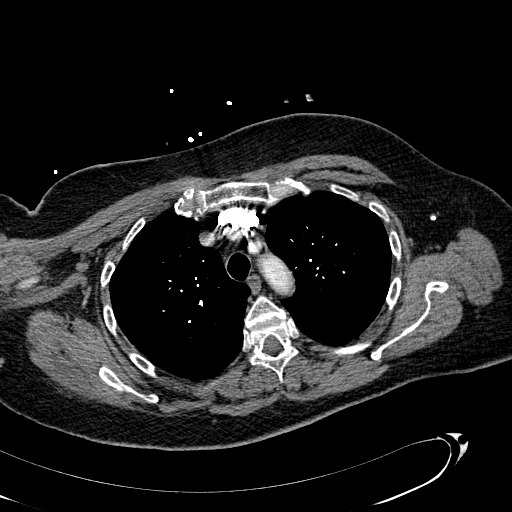
[im 201/251  lung]
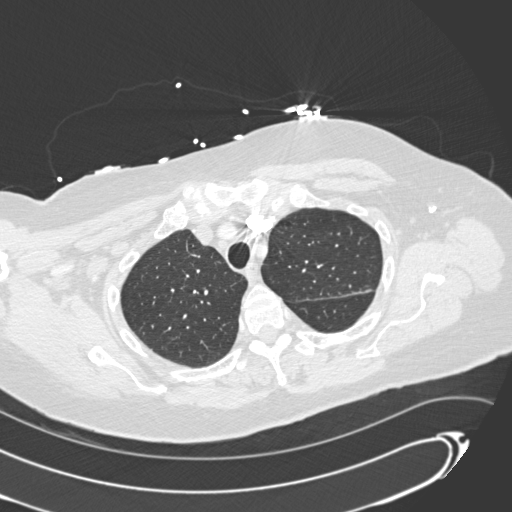
[im 213/251  mediastinal]
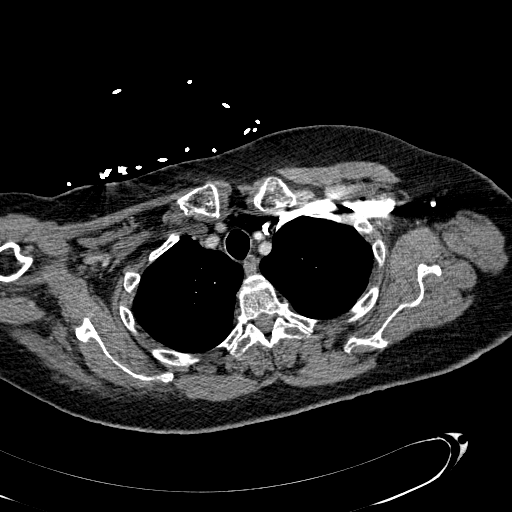
[im 226/251  lung]
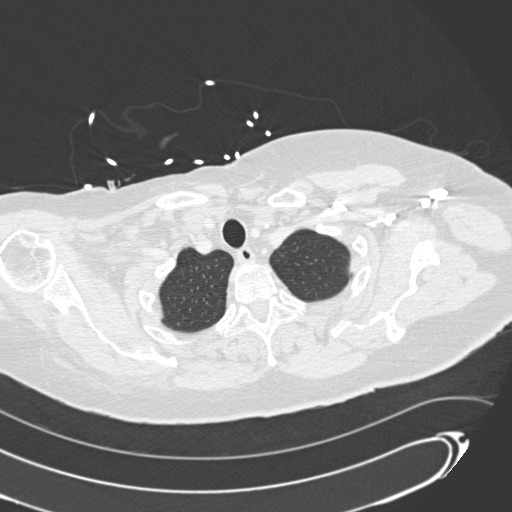
[im 238/251  mediastinal]
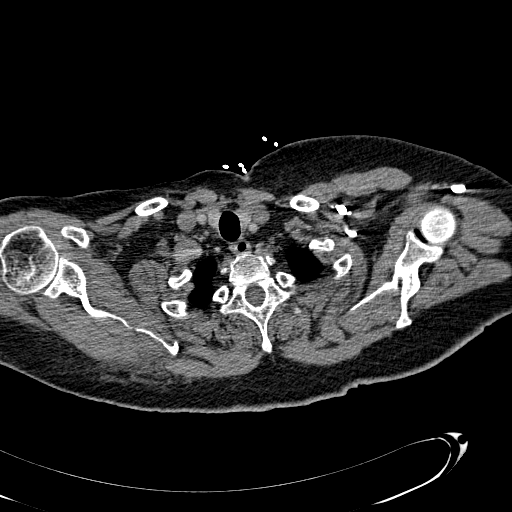

[Series 604: coronal mpr · coronal · 0.74mm/px · 1 of 110 slices shown]
[im 55/110  mediastinal]
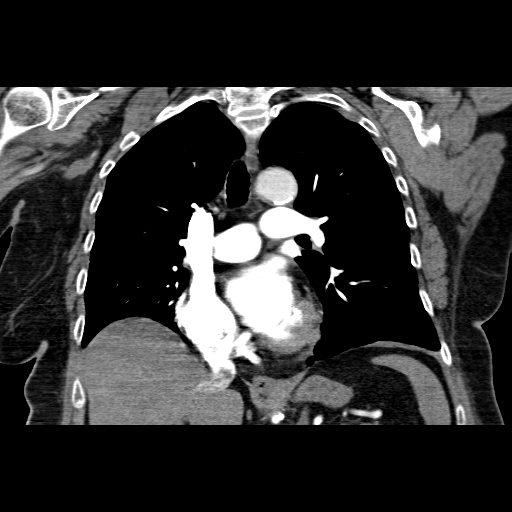

[19 of 36 positions shown; findings below may reference images not displayed]

FINDINGS: No pulmonary embolus is identified.  Heart size is
normal.  No pleural or pericardial effusion.  No axillary, hilar or
mediastinal lymphadenopathy.  Coronary and aortic atherosclerotic
vascular disease is noted.  Lungs show only some mild dependent
atelectatic change.  Incidentally imaged upper abdomen is
unremarkable.  No focal bony abnormality.
IMPRESSION: Negative for pulmonary embolus.  No acute finding.

## 2014-12-04 ENCOUNTER — Encounter (HOSPITAL_COMMUNITY): Payer: Self-pay | Admitting: Orthopedic Surgery

## 2015-02-26 ENCOUNTER — Ambulatory Visit: Payer: Self-pay

## 2015-02-26 ENCOUNTER — Encounter: Payer: Self-pay | Admitting: Podiatry

## 2015-02-26 ENCOUNTER — Ambulatory Visit (INDEPENDENT_AMBULATORY_CARE_PROVIDER_SITE_OTHER): Payer: Medicare Other

## 2015-02-26 ENCOUNTER — Ambulatory Visit (INDEPENDENT_AMBULATORY_CARE_PROVIDER_SITE_OTHER): Payer: Medicare Other | Admitting: Podiatry

## 2015-02-26 VITALS — BP 125/69 | HR 69 | Resp 14 | Ht 62.0 in | Wt 162.0 lb

## 2015-02-26 DIAGNOSIS — M79672 Pain in left foot: Secondary | ICD-10-CM | POA: Diagnosis not present

## 2015-02-26 DIAGNOSIS — M779 Enthesopathy, unspecified: Secondary | ICD-10-CM

## 2015-02-26 DIAGNOSIS — M2042 Other hammer toe(s) (acquired), left foot: Secondary | ICD-10-CM | POA: Diagnosis not present

## 2015-02-26 MED ORDER — TRIAMCINOLONE ACETONIDE 10 MG/ML IJ SUSP
10.0000 mg | Freq: Once | INTRAMUSCULAR | Status: AC
Start: 2015-02-26 — End: 2015-02-26
  Administered 2015-02-26: 10 mg

## 2015-02-26 NOTE — Progress Notes (Signed)
   Subjective:    Patient ID: [name], female    DOB: 1932/01/01, 81 y.o.   MRN: [id number]  HPI Comments: Pt state she has vice-like pain in her left forefoot for many years, but has worsened in 1 year.  Foot Pain Associated symptoms include coughing.      Review of Systems  Eyes: Positive for itching.  Respiratory: Positive for cough.   Genitourinary: Positive for urgency.  All other systems reviewed and are negative.      Objective:   Physical Exam        Assessment & Plan:

## 2015-02-27 NOTE — Progress Notes (Signed)
Subjective:     Patient ID: Vanetta ShawlGlenda J Meaney, female   DOB: May 03, 1932, 81 y.o.   MRN: [id number]  HPI patient presents stating she's had a problem with her left foot for many years but is worsened over the last year and states that it is hard at times to put her foot down. Does not remember ever having a specific injury   Review of Systems  All other systems reviewed and are negative.      Objective:   Physical Exam  Constitutional: She is oriented to person, place, and time.  Cardiovascular: Intact distal pulses.   Musculoskeletal: Normal range of motion.  Neurological: She is oriented to person, place, and time.  Skin: Skin is warm.  Nursing note and vitals reviewed.  neurovascular status intact with muscle strength adequate and range of motion subtalar midtarsal joint within normal limits. Patient's noted to have good digital perfusion is well oriented 3 and is noted to have inflammation of the fourth MPJ left with mild discomfort in the second and third MPJ left. There is also elevation of the fourth toe noted secondary to what she states is a gradual lifting of the toe     Assessment:     Possibility for inflammatory capsulitis fourth MPJ over neuroma symptoms with possibility that the toe is a part of the pathological process    Plan:     Reviewed condition and at this time did a proximal nerve block and then aspirated the fourth MPJ getting out of small amount of clear fluid and injected with a quarter cc of dexamethasone Kenalog and applied thick plantar padding. I want to see results of this and decide what else may be appropriate to help her

## 2015-03-19 ENCOUNTER — Ambulatory Visit (INDEPENDENT_AMBULATORY_CARE_PROVIDER_SITE_OTHER): Payer: Medicare Other | Admitting: Podiatry

## 2015-03-19 ENCOUNTER — Encounter: Payer: Self-pay | Admitting: Podiatry

## 2015-03-19 VITALS — BP 112/65 | HR 74 | Resp 12

## 2015-03-19 DIAGNOSIS — M2042 Other hammer toe(s) (acquired), left foot: Secondary | ICD-10-CM | POA: Diagnosis not present

## 2015-03-19 DIAGNOSIS — M779 Enthesopathy, unspecified: Secondary | ICD-10-CM | POA: Diagnosis not present

## 2015-03-19 DIAGNOSIS — M79672 Pain in left foot: Secondary | ICD-10-CM

## 2015-03-19 NOTE — Progress Notes (Signed)
Subjective:     Patient ID: [name], female   DOB: [birth year], 81 y.o.   MRN: [id number]  HPI patient states my foot is doing quite a bit better with discomfort around this joint still present when I do a lot of walking and I know that I have had it for years   Review of Systems     Objective:   Physical Exam Neurovascular status unchanged with inflammation around the fourth MPJ left that still present when pressed but improved from previous visit with plantarflexed metatarsal noted    Assessment:     Inflammatory capsulitis fourth MPJ left with plantarflexed metatarsal noted    Plan:     Reviewed condition and recommended orthotics to disperse weight off this and scanned for custom orthotic devices. Dispensed padding with instructions on usage along with advising on types of shoes I think we'll be most appropriate for this patient and reappoint when orthotics are returned

## 2015-04-06 ENCOUNTER — Telehealth: Payer: Self-pay | Admitting: *Deleted

## 2015-04-06 NOTE — Telephone Encounter (Signed)
Pt called 0816am to cancel today's 0945am appt, due to emergency.

## 2015-04-16 ENCOUNTER — Encounter: Payer: Self-pay | Admitting: Podiatry

## 2015-04-16 ENCOUNTER — Ambulatory Visit (INDEPENDENT_AMBULATORY_CARE_PROVIDER_SITE_OTHER): Payer: Medicare Other | Admitting: Podiatry

## 2015-04-16 VITALS — BP 104/65 | HR 76 | Resp 14

## 2015-04-16 DIAGNOSIS — M779 Enthesopathy, unspecified: Secondary | ICD-10-CM

## 2015-04-16 DIAGNOSIS — M2042 Other hammer toe(s) (acquired), left foot: Secondary | ICD-10-CM | POA: Diagnosis not present

## 2015-04-17 NOTE — Progress Notes (Signed)
Subjective:     Patient ID: Monique ShawlGlenda J [name], female   DOB: Jul 17, 1932, 81 y.o.   MRN: [id number]  HPI patient states my toe is feeling much better and I'm able to walk comfortably currently   Review of Systems     Objective:   Physical Exam Neurovascular status intact health status remains unchanged with fourth toe left that isn't better position and fourth metatarsal that while still tender plantarly is nowhere near as bad as it was    Assessment:     Plantarflexed metatarsal with also hammertoe deformity fourth left that's improved with trimming and padding    Plan:     Dispensed orthotics with instructions on usage and advised on reduced activity. Reappoint to recheck

## 2015-05-13 ENCOUNTER — Observation Stay (HOSPITAL_COMMUNITY)
Admission: EM | Admit: 2015-05-13 | Discharge: 2015-05-15 | Disposition: A | Discharge status: Home / Self Care | Payer: Medicare Other | Attending: Internal Medicine | Admitting: Internal Medicine

## 2015-05-13 ENCOUNTER — Encounter (HOSPITAL_COMMUNITY): Payer: Self-pay | Admitting: *Deleted

## 2015-05-13 ENCOUNTER — Emergency Department (HOSPITAL_COMMUNITY): Payer: Medicare Other

## 2015-05-13 DIAGNOSIS — R4182 Altered mental status, unspecified: Secondary | ICD-10-CM | POA: Diagnosis not present

## 2015-05-13 DIAGNOSIS — E785 Hyperlipidemia, unspecified: Secondary | ICD-10-CM | POA: Diagnosis not present

## 2015-05-13 DIAGNOSIS — R7303 Prediabetes: Secondary | ICD-10-CM | POA: Insufficient documentation

## 2015-05-13 DIAGNOSIS — G459 Transient cerebral ischemic attack, unspecified: Principal | ICD-10-CM | POA: Diagnosis present

## 2015-05-13 DIAGNOSIS — R413 Other amnesia: Secondary | ICD-10-CM | POA: Diagnosis not present

## 2015-05-13 DIAGNOSIS — Z882 Allergy status to sulfonamides status: Secondary | ICD-10-CM | POA: Insufficient documentation

## 2015-05-13 DIAGNOSIS — I10 Essential (primary) hypertension: Secondary | ICD-10-CM | POA: Diagnosis not present

## 2015-05-13 DIAGNOSIS — I7389 Other specified peripheral vascular diseases: Secondary | ICD-10-CM | POA: Diagnosis not present

## 2015-05-13 DIAGNOSIS — Z91040 Latex allergy status: Secondary | ICD-10-CM | POA: Insufficient documentation

## 2015-05-13 DIAGNOSIS — E119 Type 2 diabetes mellitus without complications: Secondary | ICD-10-CM | POA: Insufficient documentation

## 2015-05-13 DIAGNOSIS — R410 Disorientation, unspecified: Secondary | ICD-10-CM | POA: Diagnosis present

## 2015-05-13 LAB — URINALYSIS, ROUTINE W REFLEX MICROSCOPIC
Bilirubin Urine: NEGATIVE
GLUCOSE, UA: NEGATIVE mg/dL
HGB URINE DIPSTICK: NEGATIVE
KETONES UR: NEGATIVE mg/dL
Nitrite: NEGATIVE
Protein, ur: NEGATIVE mg/dL
Specific Gravity, Urine: 1.008 (ref 1.005–1.030)
Urobilinogen, UA: 0.2 mg/dL (ref 0.0–1.0)
pH: 5 (ref 5.0–8.0)

## 2015-05-13 LAB — CBC
HEMATOCRIT: 40.1 % (ref 36.0–46.0)
HEMOGLOBIN: 13.3 g/dL (ref 12.0–15.0)
MCH: 29 pg (ref 26.0–34.0)
MCHC: 33.2 g/dL (ref 30.0–36.0)
MCV: 87.4 fL (ref 78.0–100.0)
Platelets: 295 10*3/uL (ref 150–400)
RBC: 4.59 MIL/uL (ref 3.87–5.11)
RDW: 13.1 % (ref 11.5–15.5)
WBC: 7.8 10*3/uL (ref 4.0–10.5)

## 2015-05-13 LAB — COMPREHENSIVE METABOLIC PANEL
ALT: 12 U/L — AB (ref 14–54)
AST: 17 U/L (ref 15–41)
Albumin: 4 g/dL (ref 3.5–5.0)
Alkaline Phosphatase: 81 U/L (ref 38–126)
Anion gap: 10 (ref 5–15)
BILIRUBIN TOTAL: 0.4 mg/dL (ref 0.3–1.2)
BUN: 19 mg/dL (ref 6–20)
CALCIUM: 9.5 mg/dL (ref 8.9–10.3)
CHLORIDE: 104 mmol/L (ref 101–111)
CO2: 24 mmol/L (ref 22–32)
CREATININE: 1.05 mg/dL — AB (ref 0.44–1.00)
GFR, EST AFRICAN AMERICAN: 57 mL/min — AB (ref >60)
GFR, EST NON AFRICAN AMERICAN: 49 mL/min — AB (ref >60)
GLUCOSE: 94 mg/dL (ref 65–99)
POTASSIUM: 4.2 mmol/L (ref 3.5–5.1)
Sodium: 138 mmol/L (ref 135–145)
Total Protein: 7.5 g/dL (ref 6.5–8.1)

## 2015-05-13 LAB — PROTIME-INR
INR: 1.13 (ref 0.00–1.49)
Prothrombin Time: 14.6 seconds (ref 11.6–15.2)

## 2015-05-13 LAB — I-STAT CHEM 8, ED
BUN: 25 mg/dL — ABNORMAL HIGH (ref 6–20)
CHLORIDE: 104 mmol/L (ref 101–111)
Calcium, Ion: 1.22 mmol/L (ref 1.13–1.30)
Creatinine, Ser: 1.1 mg/dL — ABNORMAL HIGH (ref 0.44–1.00)
Glucose, Bld: 91 mg/dL (ref 65–99)
HEMATOCRIT: 45 % (ref 36.0–46.0)
Hemoglobin: 15.3 g/dL — ABNORMAL HIGH (ref 12.0–15.0)
POTASSIUM: 4.4 mmol/L (ref 3.5–5.1)
Sodium: 140 mmol/L (ref 135–145)
TCO2: 25 mmol/L (ref 0–100)

## 2015-05-13 LAB — DIFFERENTIAL
BASOS PCT: 1 % (ref 0–1)
Basophils Absolute: 0 10*3/uL (ref 0.0–0.1)
EOS ABS: 0.3 10*3/uL (ref 0.0–0.7)
Eosinophils Relative: 3 % (ref 0–5)
LYMPHS ABS: 2.8 10*3/uL (ref 0.7–4.0)
Lymphocytes Relative: 36 % (ref 12–46)
Monocytes Absolute: 0.6 10*3/uL (ref 0.1–1.0)
Monocytes Relative: 8 % (ref 3–12)
NEUTROS ABS: 4.1 10*3/uL (ref 1.7–7.7)
NEUTROS PCT: 52 % (ref 43–77)

## 2015-05-13 LAB — RAPID URINE DRUG SCREEN, HOSP PERFORMED
Amphetamines: NOT DETECTED
Barbiturates: NOT DETECTED
Benzodiazepines: NOT DETECTED
Cocaine: NOT DETECTED
OPIATES: NOT DETECTED
Tetrahydrocannabinol: NOT DETECTED

## 2015-05-13 LAB — URINE MICROSCOPIC-ADD ON

## 2015-05-13 LAB — I-STAT TROPONIN, ED: Troponin i, poc: 0 ng/mL (ref 0.00–0.08)

## 2015-05-13 LAB — APTT: aPTT: 31 seconds (ref 24–37)

## 2015-05-13 LAB — ETHANOL

## 2015-05-13 NOTE — ED Notes (Signed)
Pt in stating that today she has had trouble remembering how to get places, states she knows where she needs to go but doesn't know how to get there anymore, denies any other complaints, alert and oriented, on arrival, no specific onset time

## 2015-05-13 NOTE — ED Provider Notes (Signed)
CSN: [id number]     Arrival date & time 05/13/15  1704 History   First MD Initiated Contact with Patient 05/13/15 1710     Chief Complaint  Patient presents with  . Memory Loss     (Consider location/radiation/quality/duration/timing/severity/associated sxs/prior Treatment) HPI Patient presents to the emergency department with a sudden loss of memory during the day today.  The patient states that she is volunteered at [facility] hospital for the last 15 years and was unable to figure out how to drive and get to [facility].  The patient states she called her primary doctor and somehow made it to their office, but she is unclear how this happened.  The patient states that he is not sure where she went when she left her apartment.  The patient knew she was driving a new had a dry, but could not see her at how to get to [facility].  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.  She states she would be unable to do so.  She did have her know that she was at [facility] and that this is on [facility]. .  The patient denies headache, blurred vision, weakness, numbness, dizziness, back pain, neck pain, fever, chest pain, shortness of breath, abdominal pain, incontinence, dysuria, or syncope or syncope Past Medical History  Diagnosis Date  . Osteoporosis   . Hyperlipemia   . Diabetes mellitus     no mes, diet only   . Anemia, iron deficiency 12/12/2011   Past Surgical History  Procedure Laterality Date  . Tonsillectomy    . Left knee arthroscopy    . Other surgical history      left small toe bone spur removed   . Orif humerus fracture  12/09/2011    Procedure: OPEN REDUCTION INTERNAL FIXATION (ORIF) DISTAL HUMERUS FRACTURE;  Surgeon: Sharma Covert, MD;  Location: [facility];  Service: Orthopedics;  Laterality: Right;   History reviewed. No pertinent family  history. History  Substance Use Topics  . Smoking status: Former Smoker    Quit date: 12/29/2009  . Smokeless tobacco: Never Used  . Alcohol Use: No   OB History    No data available     Review of Systems  All other systems negative except as documented in the HPI. All pertinent positives and negatives as reviewed in the HPI.  Allergies  Sulfa antibiotics and Latex  Home Medications   Prior to Admission medications   Medication Sig Start Date End Date Taking? Authorizing Provider  metoprolol tartrate (LOPRESSOR) 25 MG tablet Take 1 tablet (25 mg total) by mouth 2 (two) times daily. 12/15/11 12/14/12  Bradly Bienenstock, MD   BP 118/56 mmHg  Pulse 74  Temp(Src) 98.5 F (36.9 C) (Oral)  Resp 13  Wt 160 lb (72.576 kg)  SpO2 98% Physical Exam  Constitutional: She is oriented to person, place, and time. She appears well-developed and well-nourished. No distress.  HENT:  Head: Normocephalic and atraumatic.  Mouth/Throat: Oropharynx is clear and moist.  Eyes: Pupils are equal, round, and reactive to light.  Neck: Normal range of motion. Neck supple.  Cardiovascular: Normal rate, regular rhythm and normal heart sounds.  Exam reveals no gallop and no friction rub.   No murmur heard. Pulmonary/Chest: Effort normal and breath sounds normal. No respiratory distress.  Musculoskeletal: She exhibits no  edema.  Neurological: She is alert and oriented to person, place, and time. She has normal strength. No sensory deficit. She exhibits normal muscle tone. Coordination and gait normal. GCS eye subscore is 4. GCS verbal subscore is 5. GCS motor subscore is 6.  Skin: Skin is warm and dry. No rash noted. No erythema.  Nursing note and vitals reviewed.   ED Course  Procedures (including critical care time) Labs Review Labs Reviewed  COMPREHENSIVE METABOLIC PANEL - Abnormal; Notable for the following:    Creatinine, Ser 1.05 (*)    ALT 12 (*)    GFR calc non Af Amer 49 (*)    GFR calc Af  Amer 57 (*)    All other components within normal limits  URINALYSIS, ROUTINE W REFLEX MICROSCOPIC (NOT AT [facility]) - Abnormal; Notable for the following:    Leukocytes, UA MODERATE (*)    All other components within normal limits  URINE MICROSCOPIC-ADD ON - Abnormal; Notable for the following:    Squamous Epithelial / LPF FEW (*)    Bacteria, UA FEW (*)    All other components within normal limits  I-STAT CHEM 8, ED - Abnormal; Notable for the following:    BUN 25 (*)    Creatinine, Ser 1.10 (*)    Hemoglobin 15.3 (*)    All other components within normal limits  ETHANOL  PROTIME-INR  APTT  CBC  DIFFERENTIAL  URINE RAPID DRUG SCREEN, HOSP PERFORMED  I-STAT TROPOININ, ED    Imaging Review Ct Head Wo Contrast  05/13/2015   CLINICAL DATA:  Acute onset confusion and memory loss.  EXAM: CT HEAD WITHOUT CONTRAST  TECHNIQUE: Contiguous axial images were obtained from the base of the skull through the vertex without intravenous contrast.  COMPARISON:  None.  FINDINGS: There is mild diffuse atrophy. There is no intracranial mass, hemorrhage, extra-axial fluid collection, or midline shift. There is an Ace prior lacunar infarct in the medial posterior left cerebellum. A second prior lacunar infarct is noted in the periphery of the left mid cerebellum. A smaller prior appearing lacunar infarct is noted in the mid right cerebellum posteriorly. There is mild small vessel disease in the centra semiovale bilaterally. No acute infarct is demonstrable on this study. The bony calvarium appears intact. The mastoid air cells are clear.  IMPRESSION: Atrophy with patchy periventricular small vessel disease. Prior small infarcts in the cerebellar hemispheres bilaterally. No acute infarct is apparent currently. No hemorrhage or mass effect.   Electronically Signed   By: Bretta Bang III M.D.   On: 05/13/2015 17:50   Mr Brain Wo Contrast  05/13/2015   CLINICAL DATA:  Acute onset of memory loss. Beginning today,  she has been unable to recall how to find places. She does recall where she is supposed to go. She just does not know how to get there.  EXAM: MRI HEAD WITHOUT CONTRAST  TECHNIQUE: Multiplanar, multiecho pulse sequences of the brain and surrounding structures were obtained without intravenous contrast.  COMPARISON:  CT head without contrast from the same day.  FINDINGS: The diffusion-weighted images demonstrate no evidence for acute or subacute infarction. Bilateral remote cerebellar nonhemorrhagic infarcts are present, left greater than right. Mild generalized atrophy is noted. Mild periventricular and scattered subcortical T2 hyperintensities are present.  Dilated perivascular spaces are noted. Flow is present in the major intracranial arteries.  Bilateral lens replacements are evident. The globes and orbits are intact. Mild mucosal thickening is present in the anterior right ethmoid air cells.  The remaining paranasal sinuses and the mastoid air cells are clear.  IMPRESSION: 1. No acute intracranial abnormality. 2. Remote infarcts of the cerebellum bilaterally, more prominent on the left. 3. Mild generalized atrophy and white matter disease likely reflects the sequela of chronic microvascular ischemia.   Electronically Signed   By: Marin Roberts M.D.   On: 05/13/2015 21:26     EKG Interpretation None      The patient will be admitted for further evaluation of a possible TIA neurology and the [facility] seen the patient.  Patient is advised plan and all questions were answered   Charlestine Night, PA-C 05/15/15 1610  Bethann Berkshire, MD 05/15/15 [phone]

## 2015-05-13 NOTE — ED Notes (Signed)
Pt leaving for CT.  

## 2015-05-13 NOTE — Consult Note (Signed)
Admission H&P    Chief Complaint: Altered mental status.   HPI: [name] is an 81 y.o. female history diabetes mellitus, hyperlipidemia, osteoporosis and anemia who was sent to the emergency room by her private physician for evaluation of acute confusion. Patient was unable to remember how to get from one place to another while driving locally in [place] this morning. 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 She could not remember how to get to [facility]. She knew that something was wrong and called her 2 office and was given step-by-step directions, for immediate evaluation. There was no associated headache nor dizziness. There was no focal weakness no numbness. She had no changes in speech. She was noted to have continued difficulty with being able to about how to get from one place to another, even after arriving in the emergency room. She lives independently and is never had difficulty driving. She had no problems with operating a motor vehicle today.  Past Medical History  Diagnosis Date  . Osteoporosis   . Hyperlipemia   . Diabetes mellitus     no mes, diet only   . Anemia, iron deficiency 12/12/2011    Past Surgical History  Procedure Laterality Date  . Tonsillectomy    . Left knee arthroscopy    . Other surgical history      left small toe bone spur removed   . Orif humerus fracture  12/09/2011    Procedure: OPEN REDUCTION INTERNAL FIXATION (ORIF) DISTAL HUMERUS FRACTURE;  Surgeon: Linna Hoff, MD;  Location: [facility];  Service: Orthopedics;  Laterality: Right;    Family history: Family history was reviewed. Patient had no pertinent family history.  Social History:  reports that she quit smoking about 5 years ago. She has never used smokeless tobacco. She reports that she does not drink alcohol or use illicit drugs.  Allergies:  Allergies  Allergen Reactions  . Sulfa Antibiotics Hives, Diarrhea and Nausea And Vomiting     Facial swelling  . Latex Rash    Medications: Patient's preadmission medications were reviewed by me.  ROS: History obtained from the patient  General ROS: negative for - chills, fatigue, fever, night sweats, weight gain or weight loss Psychological ROS: negative for - behavioral disorder, hallucinations, memory difficulties, mood swings or suicidal ideation Ophthalmic ROS: negative for - blurry vision, double vision, eye pain or loss of vision ENT ROS: negative for - epistaxis, nasal discharge, oral lesions, sore throat, tinnitus or vertigo Allergy and Immunology ROS: negative for - hives or itchy/watery eyes Hematological and Lymphatic ROS: negative for - bleeding problems, bruising or swollen lymph nodes Endocrine ROS: negative for - galactorrhea, hair pattern changes, polydipsia/polyuria or temperature intolerance Respiratory ROS: negative for - cough, hemoptysis, shortness of breath or wheezing Cardiovascular ROS: negative for - chest pain, dyspnea on exertion, edema or irregular heartbeat Gastrointestinal ROS: negative for - abdominal pain, diarrhea, hematemesis, nausea/vomiting or stool incontinence Genito-Urinary ROS: negative for - dysuria, hematuria, incontinence or urinary frequency/urgency Musculoskeletal ROS: negative for - joint swelling or muscular weakness Neurological ROS: as noted in HPI Dermatological ROS: negative for rash and skin lesion changes  Physical Examination: Blood pressure 149/58, pulse 59, temperature 98.5 F (36.9 C), temperature source Oral, resp. rate 13, weight 72.576 kg (160 lb), SpO2 95 %.  HEENT-  Normocephalic, no lesions, without obvious abnormality.  Normal external eye and conjunctiva.  Normal TM's bilaterally.  Normal auditory canals and external ears. Normal external  nose, mucus membranes and septum.  Normal pharynx. Neck supple with no masses, nodes, nodules or enlargement. Cardiovascular - regular rate and rhythm, S1, S2 normal, no  murmur, click, rub or gallop Lungs - chest clear, no wheezing, rales, normal symmetric air entry Abdomen - soft, non-tender; bowel sounds normal; no masses,  no organomegaly Extremities - no joint deformities, effusion, or inflammation, no edema and no skin discoloration  Neurologic Examination: Mental Status: Alert, oriented, thought content appropriate.  Speech fluent without evidence of aphasia. Able to follow commands without difficulty. Cranial Nerves: II-Visual fields were normal. III/IV/VI-Pupils were equal and reacted normally to light. Extraocular movements were full and conjugate.    V/VII-no facial numbness and no facial weakness. VIII-normal. X-normal speech and symmetrical palatal movement. XI: trapezius strength/neck flexion strength normal bilaterally XII-midline tongue extension with normal strength. Motor: 5/5 bilaterally with normal tone and bulk Sensory: Normal throughout. Deep Tendon Reflexes: 1+ and symmetric. Plantars: Mute bilaterally Cerebellar: Normal finger-to-nose testing. Carotid auscultation: Normal  Results for orders placed or performed during the hospital encounter of 05/13/15 (from the past 48 hour(s))  Ethanol     Status: None   Collection Time: 05/13/15  5:15 PM  Result Value Ref Range   Alcohol, Ethyl (B) <5 <5 mg/dL    Comment:        LOWEST DETECTABLE LIMIT FOR SERUM ALCOHOL IS 5 mg/dL FOR MEDICAL PURPOSES ONLY   Protime-INR     Status: None   Collection Time: 05/13/15  5:15 PM  Result Value Ref Range   Prothrombin Time 14.6 11.6 - 15.2 seconds   INR 1.13 0.00 - 1.49  APTT     Status: None   Collection Time: 05/13/15  5:15 PM  Result Value Ref Range   aPTT 31 24 - 37 seconds  CBC     Status: None   Collection Time: 05/13/15  5:15 PM  Result Value Ref Range   WBC 7.8 4.0 - 10.5 K/uL   RBC 4.59 3.87 - 5.11 MIL/uL   Hemoglobin 13.3 12.0 - 15.0 g/dL   HCT 40.1 36.0 - 46.0 %   MCV 87.4 78.0 - 100.0 fL   MCH 29.0 26.0 - 34.0 pg   MCHC  33.2 30.0 - 36.0 g/dL   RDW 13.1 11.5 - 15.5 %   Platelets 295 150 - 400 K/uL  Differential     Status: None   Collection Time: 05/13/15  5:15 PM  Result Value Ref Range   Neutrophils Relative % 52 43 - 77 %   Neutro Abs 4.1 1.7 - 7.7 K/uL   Lymphocytes Relative 36 12 - 46 %   Lymphs Abs 2.8 0.7 - 4.0 K/uL   Monocytes Relative 8 3 - 12 %   Monocytes Absolute 0.6 0.1 - 1.0 K/uL   Eosinophils Relative 3 0 - 5 %   Eosinophils Absolute 0.3 0.0 - 0.7 K/uL   Basophils Relative 1 0 - 1 %   Basophils Absolute 0.0 0.0 - 0.1 K/uL  Comprehensive metabolic panel     Status: Abnormal   Collection Time: 05/13/15  5:15 PM  Result Value Ref Range   Sodium 138 135 - 145 mmol/L   Potassium 4.2 3.5 - 5.1 mmol/L   Chloride 104 101 - 111 mmol/L   CO2 24 22 - 32 mmol/L   Glucose, Bld 94 65 - 99 mg/dL   BUN 19 6 - 20 mg/dL   Creatinine, Ser 1.05 (H) 0.44 - 1.00 mg/dL   Calcium 9.5  8.9 - 10.3 mg/dL   Total Protein 7.5 6.5 - 8.1 g/dL   Albumin 4.0 3.5 - 5.0 g/dL   AST 17 15 - 41 U/L   ALT 12 (L) 14 - 54 U/L   Alkaline Phosphatase 81 38 - 126 U/L   Total Bilirubin 0.4 0.3 - 1.2 mg/dL   GFR calc non Af Amer 49 (L) >60 mL/min   GFR calc Af Amer 57 (L) >60 mL/min    Comment: (NOTE) The eGFR has been calculated using the CKD EPI equation. This calculation has not been validated in all clinical situations. eGFR's persistently <60 mL/min signify possible Chronic Kidney Disease.    Anion gap 10 5 - 15  I-stat troponin, ED (not at [facility])     Status: None   Collection Time: 05/13/15  5:24 PM  Result Value Ref Range   Troponin i, poc 0.00 0.00 - 0.08 ng/mL   Comment 3            Comment: Due to the release kinetics of cTnI, a negative result within the first hours of the onset of symptoms does not rule out myocardial infarction with certainty. If myocardial infarction is still suspected, repeat the test at appropriate intervals.   I-Stat Chem 8, ED  (not at [facility])     Status: Abnormal    Collection Time: 05/13/15  5:26 PM  Result Value Ref Range   Sodium 140 135 - 145 mmol/L   Potassium 4.4 3.5 - 5.1 mmol/L   Chloride 104 101 - 111 mmol/L   BUN 25 (H) 6 - 20 mg/dL   Creatinine, Ser 1.10 (H) 0.44 - 1.00 mg/dL   Glucose, Bld 91 65 - 99 mg/dL   Calcium, Ion 1.22 1.13 - 1.30 mmol/L   TCO2 25 0 - 100 mmol/L   Hemoglobin 15.3 (H) 12.0 - 15.0 g/dL   HCT 45.0 36.0 - 46.0 %  Urine rapid drug screen (hosp performed)not at [facility]     Status: None   Collection Time: 05/13/15  6:45 PM  Result Value Ref Range   Opiates NONE DETECTED NONE DETECTED   Cocaine NONE DETECTED NONE DETECTED   Benzodiazepines NONE DETECTED NONE DETECTED   Amphetamines NONE DETECTED NONE DETECTED   Tetrahydrocannabinol NONE DETECTED NONE DETECTED   Barbiturates NONE DETECTED NONE DETECTED    Comment:        DRUG SCREEN FOR MEDICAL PURPOSES ONLY.  IF CONFIRMATION IS NEEDED FOR ANY PURPOSE, NOTIFY LAB WITHIN 5 DAYS.        LOWEST DETECTABLE LIMITS FOR URINE DRUG SCREEN Drug Class       Cutoff (ng/mL) Amphetamine      1000 Barbiturate      200 Benzodiazepine   543 Tricyclics       606 Opiates          300 Cocaine          300 THC              50   Urinalysis, Routine w reflex microscopic (not at [facility])     Status: Abnormal   Collection Time: 05/13/15  6:49 PM  Result Value Ref Range   Color, Urine YELLOW YELLOW   APPearance CLEAR CLEAR   Specific Gravity, Urine 1.008 1.005 - 1.030   pH 5.0 5.0 - 8.0   Glucose, UA NEGATIVE NEGATIVE mg/dL   Hgb urine dipstick NEGATIVE NEGATIVE   Bilirubin Urine NEGATIVE NEGATIVE   Ketones, ur NEGATIVE NEGATIVE mg/dL  Protein, ur NEGATIVE NEGATIVE mg/dL   Urobilinogen, UA 0.2 0.0 - 1.0 mg/dL   Nitrite NEGATIVE NEGATIVE   Leukocytes, UA MODERATE (A) NEGATIVE  Urine microscopic-add on     Status: Abnormal   Collection Time: 05/13/15  6:49 PM  Result Value Ref Range   Squamous Epithelial / LPF FEW (A) RARE   WBC, UA 7-10 <3 WBC/hpf   Bacteria, UA FEW (A) RARE    Urine-Other MUCOUS PRESENT    Ct Head Wo Contrast  05/13/2015   CLINICAL DATA:  Acute onset confusion and memory loss.  EXAM: CT HEAD WITHOUT CONTRAST  TECHNIQUE: Contiguous axial images were obtained from the base of the skull through the vertex without intravenous contrast.  COMPARISON:  None.  FINDINGS: There is mild diffuse atrophy. There is no intracranial mass, hemorrhage, extra-axial fluid collection, or midline shift. There is an Ace prior lacunar infarct in the medial posterior left cerebellum. A second prior lacunar infarct is noted in the periphery of the left mid cerebellum. A smaller prior appearing lacunar infarct is noted in the mid right cerebellum posteriorly. There is mild small vessel disease in the centra semiovale bilaterally. No acute infarct is demonstrable on this study. The bony calvarium appears intact. The mastoid air cells are clear.  IMPRESSION: Atrophy with patchy periventricular small vessel disease. Prior small infarcts in the cerebellar hemispheres bilaterally. No acute infarct is apparent currently. No hemorrhage or mass effect.   Electronically Signed   By: Lowella Grip III M.D.   On: 05/13/2015 17:50   Mr Brain Wo Contrast  05/13/2015   CLINICAL DATA:  Acute onset of memory loss. Beginning today, she has been unable to recall how to find places. She does recall where she is supposed to go. She just does not know how to get there.  EXAM: MRI HEAD WITHOUT CONTRAST  TECHNIQUE: Multiplanar, multiecho pulse sequences of the brain and surrounding structures were obtained without intravenous contrast.  COMPARISON:  CT head without contrast from the same day.  FINDINGS: The diffusion-weighted images demonstrate no evidence for acute or subacute infarction. Bilateral remote cerebellar nonhemorrhagic infarcts are present, left greater than right. Mild generalized atrophy is noted. Mild periventricular and scattered subcortical T2 hyperintensities are present.  Dilated  perivascular spaces are noted. Flow is present in the major intracranial arteries.  Bilateral lens replacements are evident. The globes and orbits are intact. Mild mucosal thickening is present in the anterior right ethmoid air cells. The remaining paranasal sinuses and the mastoid air cells are clear.  IMPRESSION: 1. No acute intracranial abnormality. 2. Remote infarcts of the cerebellum bilaterally, more prominent on the left. 3. Mild generalized atrophy and white matter disease likely reflects the sequela of chronic microvascular ischemia.   Electronically Signed   By: San Morelle M.D.   On: 05/13/2015 21:26    Assessment/Plan  81 year old lady presenting with altered mental status with confusion of unclear etiology. CT scan of her head showed no acute intracranial abnormality. Acute TIA or stroke cannot be ruled out. Partial seizure cannot be ruled out as well.   Recommendations: 1. MRI and MRA of the brain without contrast 2. Carotid Doppler study 3. 2-D echocardiogram 4. Hemoglobin A1c and fasting lipid panel 5. Aspirin 81 mg per day 6. EEG, routine adult study.  We will continue to follow this patient with you   C.R. Nicole Kindred, [place] [facility] [phone]   05/13/2015, 10:00 PM

## 2015-05-14 ENCOUNTER — Observation Stay (HOSPITAL_COMMUNITY): Payer: Medicare Other

## 2015-05-14 ENCOUNTER — Encounter (HOSPITAL_COMMUNITY): Payer: Self-pay | Admitting: *Deleted

## 2015-05-14 ENCOUNTER — Observation Stay (HOSPITAL_BASED_OUTPATIENT_CLINIC_OR_DEPARTMENT_OTHER): Payer: Medicare Other

## 2015-05-14 DIAGNOSIS — I10 Essential (primary) hypertension: Secondary | ICD-10-CM | POA: Insufficient documentation

## 2015-05-14 DIAGNOSIS — G459 Transient cerebral ischemic attack, unspecified: Secondary | ICD-10-CM

## 2015-05-14 DIAGNOSIS — E119 Type 2 diabetes mellitus without complications: Secondary | ICD-10-CM | POA: Diagnosis present

## 2015-05-14 DIAGNOSIS — E785 Hyperlipidemia, unspecified: Secondary | ICD-10-CM | POA: Diagnosis not present

## 2015-05-14 DIAGNOSIS — R7303 Prediabetes: Secondary | ICD-10-CM | POA: Insufficient documentation

## 2015-05-14 DIAGNOSIS — R7309 Other abnormal glucose: Secondary | ICD-10-CM | POA: Diagnosis not present

## 2015-05-14 LAB — LIPID PANEL
Cholesterol: 219 mg/dL — ABNORMAL HIGH (ref 0–200)
HDL: 56 mg/dL (ref >40)
LDL CALC: 151 mg/dL — AB (ref 0–99)
TRIGLYCERIDES: 58 mg/dL (ref <150)
Total CHOL/HDL Ratio: 3.9 RATIO
VLDL: 12 mg/dL (ref 0–40)

## 2015-05-14 LAB — GLUCOSE, CAPILLARY
GLUCOSE-CAPILLARY: 100 mg/dL — AB (ref 65–99)
GLUCOSE-CAPILLARY: 89 mg/dL (ref 65–99)
GLUCOSE-CAPILLARY: 104 mg/dL — AB (ref 65–99)
GLUCOSE-CAPILLARY: 122 mg/dL — AB (ref 65–99)

## 2015-05-14 LAB — VITAMIN B12: Vitamin B-12: 125 pg/mL — ABNORMAL LOW (ref 180–914)

## 2015-05-14 LAB — TSH: TSH: 2.575 u[IU]/mL (ref 0.350–4.500)

## 2015-05-14 MED ORDER — ASPIRIN EC 81 MG PO TBEC
81.0000 mg | DELAYED_RELEASE_TABLET | Freq: Every day | ORAL | Status: DC
  Administered 2015-05-14 – 2015-05-15 (×2): 81 mg via ORAL
  Filled 2015-05-14 (×2): qty 1

## 2015-05-14 MED ORDER — STROKE: EARLY STAGES OF RECOVERY BOOK
Freq: Once | Status: AC
  Administered 2015-05-14: 03:00:00

## 2015-05-14 MED ORDER — METOPROLOL TARTRATE 12.5 MG HALF TABLET
12.5000 mg | ORAL_TABLET | Freq: Two times a day (BID) | ORAL | Status: DC
  Administered 2015-05-14 – 2015-05-15 (×3): 12.5 mg via ORAL
  Filled 2015-05-14 (×3): qty 1

## 2015-05-14 MED ORDER — ENOXAPARIN SODIUM 40 MG/0.4ML ~~LOC~~ SOLN
40.0000 mg | SUBCUTANEOUS | Status: DC
  Administered 2015-05-14 – 2015-05-15 (×2): 40 mg via SUBCUTANEOUS
  Filled 2015-05-14 (×2): qty 0.4

## 2015-05-14 NOTE — Progress Notes (Signed)
VASCULAR LAB PRELIMINARY  PRELIMINARY  PRELIMINARY  PRELIMINARY  Carotid Doppler completed.    Preliminary report:  1-39% ICA stenosis.  Vertebral artery flow is antegrade.   Rayla Pember, RVT 05/14/2015, 12:38 PM

## 2015-05-14 NOTE — Progress Notes (Signed)
  Echocardiogram 2D Echocardiogram has been performed.  Cathie Beams 05/14/2015, 11:36 AM

## 2015-05-14 NOTE — Progress Notes (Signed)
Received request to contact patient's daughter [name] [phone] regarding discharge planning. CM met with patient, who gave permission for CM to speak with daughter.  Patient's daughter would like to be kept informed of test results and therapy recommendations to assist in discharge planning.  CM explained that PT/OT is still pending and we will be better able to formulate a discharge plan once patient has been evaluated by therapy.  Daughter has also given permission for CM to speak with the friends listed as emergency contacts at any point regarding discharge, as they provide a great deal of assistance to patient when daughter is not able.  This information was shared with CSW in the event that SNF is needed.  Daughter verbalized understanding of plan.  CM awaits PT/OT recommendations and will continue to follow.

## 2015-05-14 NOTE — Progress Notes (Signed)
Patient seen and examined. Admitted after midnight due to acute confusion and transient episode of memory loss. Admitted for TIA workup. CT head and MRI negative for acute intracranial abnormalities. Patient's family concerned for dementia. Please referred to H&P written by Dr. Lovell Sheehan for further info/details on admission.  Plan: -will check TSH, B12 -follow results of carotid dopplers and 2-D echo -PT/OT to evaluate and provide rec's -will check lipid panel and A1C -will use aspirin for secondary prevention.  [name] [phone]

## 2015-05-14 NOTE — H&P (Addendum)
[facility] Admission History and Physical       Frank Pilger Urbanek ZOX:[id number] DOB: May 19, 1932 DOA: 05/13/2015  Referring physician: EDP PCP: Lupita Raider, MD  Specialists:   Chief Complaint: Confusion  HPI: [place] Eppard Gilson is a 81 y.o. female with a history of Diet Controlled DM2, HTN, Hyperlipidemia  who was referred to the ED after she had called with complaints of sudden onset of confusion and memory loss when she got into her car this AM.   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  She denied having any headache or dizziness or chest pain.  She was evaluated in the ED and seen by Neurology and a CT scan of the Head and MRI of the brain were performed and were negtative for acute findings. She reported being back to her baseline while she was in the ED.    A TIA Workup was initiated.      Review of Systems:  Constitutional: No Weight Loss, No Weight Gain, Night Sweats, Fevers, Chills, Dizziness, Light Headedness, Fatigue, or Generalized Weakness HEENT: No Headaches, Difficulty Swallowing,Tooth/Dental Problems,Sore Throat,  No Sneezing, Rhinitis, Ear Ache, Nasal Congestion, or Post Nasal Drip,  Cardio-vascular:  No Chest pain, Orthopnea, PND, Edema in Lower Extremities, Anasarca, Dizziness, Palpitations  Resp: No Dyspnea, No DOE, No Productive Cough, No Non-Productive Cough, No Hemoptysis, No Wheezing.    GI: No Heartburn, Indigestion, Abdominal Pain, Nausea, Vomiting, Diarrhea, Constipation, Hematemesis, Hematochezia, Melena, Change in Bowel Habits,  Loss of Appetite  GU: No Dysuria, No Change in Color of Urine, No Urgency or Urinary Frequency, No Flank pain.  Musculoskeletal: No Joint Pain or Swelling, No Decreased Range of Motion, No Back Pain.  Neurologic: No Syncope, No Seizures, Muscle Weakness, Paresthesia, Vision Disturbance or Loss, No Diplopia, No Vertigo, No Difficulty Walking,  Skin:  No Rash or Lesions. Psych: No Change in Mood or Affect, No Depression or Anxiety, No +Transient Memory loss, +Transient Confusion, or Hallucinations   Past Medical History  Diagnosis Date  . Osteoporosis   . Hyperlipemia   . Diabetes mellitus     no mes, diet only   . Anemia, iron deficiency 12/12/2011     Past Surgical History  Procedure Laterality Date  . Tonsillectomy    . Left knee arthroscopy    . Other surgical history      left small toe bone spur removed   . Orif humerus fracture  12/09/2011    Procedure: OPEN REDUCTION INTERNAL FIXATION (ORIF) DISTAL HUMERUS FRACTURE;  Surgeon: Sharma Covert, MD;  Location: [facility];  Service: Orthopedics;  Laterality: Right;      Prior to Admission medications   Medication Sig Start Date End Date Taking? Authorizing Provider  metoprolol tartrate (LOPRESSOR) 25 MG tablet Take 1 tablet (25 mg total) by mouth 2 (two) times daily. 12/15/11 12/14/12  Bradly Bienenstock, MD     Allergies  Allergen Reactions  . Sulfa Antibiotics Hives, Diarrhea and Nausea And Vomiting    Facial swelling  . Latex Rash    Social History:  Retired Engineer, civil (consulting) from [employer], Working as a Agricultural consultant at [employer] Currently, Very Active, Lives Independently  reports that she quit smoking about 5 years ago. She has never used smokeless tobacco. She reports that she does not drink alcohol or use illicit drugs.      History reviewed. No pertinent family history.     Physical Exam:  GEN:  Pleasant Elderly Well Nourished and Well Developed 81 y.o. Caucasian female examined and in no acute distress; cooperative with exam Filed Vitals:   05/14/15 0036 05/14/15 0230 05/14/15 0430 05/14/15 0610  BP: 151/59 142/58 145/60 149/68  Pulse: 61 66 62   Temp: 98.6 F (37 C) 98.5 F (36.9 C) 98.4 F (36.9 C) 98.4 F (36.9 C)  TempSrc: Oral Oral Oral Oral  Resp:      Weight:      SpO2: 96% 96% 96% 94%   Blood pressure 149/68, pulse 62, temperature 98.4 F (36.9 C),  temperature source Oral, resp. rate 13, weight 72.576 kg (160 lb), SpO2 94 %. PSYCH: She is alert and oriented x4; does not appear anxious does not appear depressed; affect is normal HEENT: Normocephalic and Atraumatic, Mucous membranes pink; PERRLA; EOM intact; Fundi:  Benign;  No scleral icterus, Nares: Patent, Oropharynx: Clear, Edentulous (has Dentures Present),    Neck:  FROM, No Cervical Lymphadenopathy nor Thyromegaly or Carotid Bruit; No JVD; Breasts:: Not examined CHEST WALL: No tenderness CHEST: Normal respiration, clear to auscultation bilaterally HEART: Regular rate and rhythm; no murmurs rubs or gallops BACK: No kyphosis or scoliosis; No CVA tenderness ABDOMEN: Positive Bowel Sounds, Soft Non-Tender, No Rebound or Guarding; No Masses, No Organomegaly. Rectal Exam: Not done EXTREMITIES: No Cyanosis, Clubbing, or Edema; No Ulcerations. Genitalia: not examined PULSES: 2+ and symmetric SKIN: Normal hydration no rash or ulceration CNS:  Alert and Oriented x 4, No Focal Deficits Vascular: pulses palpable throughout    Labs on Admission:  Basic Metabolic Panel:  Recent Labs Lab 05/13/15 1715 05/13/15 1726  NA 138 140  K 4.2 4.4  CL 104 104  CO2 24  --   GLUCOSE 94 91  BUN 19 25*  CREATININE 1.05* 1.10*  CALCIUM 9.5  --    Liver Function Tests:  Recent Labs Lab 05/13/15 1715  AST 17  ALT 12*  ALKPHOS 81  BILITOT 0.4  PROT 7.5  ALBUMIN 4.0   No results for input(s): LIPASE, AMYLASE in the last 168 hours. No results for input(s): AMMONIA in the last 168 hours. CBC:  Recent Labs Lab 05/13/15 1715 05/13/15 1726  WBC 7.8  --   NEUTROABS 4.1  --   HGB 13.3 15.3*  HCT 40.1 45.0  MCV 87.4  --   PLT 295  --    Cardiac Enzymes: No results for input(s): CKTOTAL, CKMB, CKMBINDEX, TROPONINI in the last 168 hours.  BNP (last 3 results) No results for input(s): BNP in the last 8760 hours.  ProBNP (last 3 results) No results for input(s): PROBNP in the last  8760 hours.  CBG:  Recent Labs Lab 05/14/15 0627  GLUCAP 100*    Radiological Exams on Admission: Ct Head Wo Contrast  05/13/2015   CLINICAL DATA:  Acute onset confusion and memory loss.  EXAM: CT HEAD WITHOUT CONTRAST  TECHNIQUE: Contiguous axial images were obtained from the base of the skull through the vertex without intravenous contrast.  COMPARISON:  None.  FINDINGS: There is mild diffuse atrophy. There is no intracranial mass, hemorrhage, extra-axial fluid collection, or midline shift. There is an Ace prior lacunar infarct in the medial posterior left cerebellum. A second prior lacunar infarct is noted in the periphery of the left mid cerebellum. A smaller prior appearing lacunar infarct is noted in the mid right cerebellum posteriorly. There is mild small vessel disease in the centra semiovale bilaterally. No acute infarct is demonstrable on this study. The  bony calvarium appears intact. The mastoid air cells are clear.  IMPRESSION: Atrophy with patchy periventricular small vessel disease. Prior small infarcts in the cerebellar hemispheres bilaterally. No acute infarct is apparent currently. No hemorrhage or mass effect.   Electronically Signed   By: Bretta Bang III M.D.   On: 05/13/2015 17:50   Mr Brain Wo Contrast  05/13/2015   CLINICAL DATA:  Acute onset of memory loss. Beginning today, she has been unable to recall how to find places. She does recall where she is supposed to go. She just does not know how to get there.  EXAM: MRI HEAD WITHOUT CONTRAST  TECHNIQUE: Multiplanar, multiecho pulse sequences of the brain and surrounding structures were obtained without intravenous contrast.  COMPARISON:  CT head without contrast from the same day.  FINDINGS: The diffusion-weighted images demonstrate no evidence for acute or subacute infarction. Bilateral remote cerebellar nonhemorrhagic infarcts are present, left greater than right. Mild generalized atrophy is noted. Mild periventricular  and scattered subcortical T2 hyperintensities are present.  Dilated perivascular spaces are noted. Flow is present in the major intracranial arteries.  Bilateral lens replacements are evident. The globes and orbits are intact. Mild mucosal thickening is present in the anterior right ethmoid air cells. The remaining paranasal sinuses and the mastoid air cells are clear.  IMPRESSION: 1. No acute intracranial abnormality. 2. Remote infarcts of the cerebellum bilaterally, more prominent on the left. 3. Mild generalized atrophy and white matter disease likely reflects the sequela of chronic microvascular ischemia.   Electronically Signed   By: Marin Roberts M.D.   On: 05/13/2015 21:26     EKG: Independently reviewed. Normal Sinus rhythm at 67 No Acute S-T changes       Assessment/Plan:     81 y.o. female with  Principal Problem:   1.    TIA (transient ischemic attack)   TIA Workup   Cardiac monitoring   Neuro Checks   Check Carotid US, and 2D ECHO in AM   Check Fasting Lipids, and HbA1C   Neuro Dr Roseanne Reno saw in ED     Active Problems:   2.    Diabetes mellitus- Diet Controlled   Check HbA1C     3.    Hypertension   Continue Metoprolol Rx   Monitor BPs     4.    Hyperlipidemia   Check Fasting Lipids     5.    DVT Prophylaxis   Lovenox    Code Status:     FULL CODE        Family Communication:    No Family Present    Disposition Plan:   Observation Status        Time spent:  102 Minutes      Ron Parker [facility] Pager [phone]   If 7AM -7PM Please Contact the Day Rounding Team MD for [facility]  If 7PM-7AM, Please Contact Night-Floor Coverage  [email or web address] Password [email or web address] 05/14/2015, 9:00 AM     ADDENDUM:   Patient was seen and examined on 05/13/2015

## 2015-05-15 ENCOUNTER — Observation Stay (HOSPITAL_COMMUNITY): Payer: Medicare Other

## 2015-05-15 DIAGNOSIS — F05 Delirium due to known physiological condition: Secondary | ICD-10-CM

## 2015-05-15 DIAGNOSIS — I10 Essential (primary) hypertension: Secondary | ICD-10-CM

## 2015-05-15 DIAGNOSIS — G458 Other transient cerebral ischemic attacks and related syndromes: Secondary | ICD-10-CM

## 2015-05-15 DIAGNOSIS — G459 Transient cerebral ischemic attack, unspecified: Secondary | ICD-10-CM | POA: Diagnosis not present

## 2015-05-15 DIAGNOSIS — E785 Hyperlipidemia, unspecified: Secondary | ICD-10-CM | POA: Diagnosis not present

## 2015-05-15 LAB — BASIC METABOLIC PANEL
Anion gap: 7 (ref 5–15)
BUN: 12 mg/dL (ref 6–20)
CO2: 28 mmol/L (ref 22–32)
Calcium: 8.8 mg/dL — ABNORMAL LOW (ref 8.9–10.3)
Chloride: 104 mmol/L (ref 101–111)
Creatinine, Ser: 1.06 mg/dL — ABNORMAL HIGH (ref 0.44–1.00)
GFR calc non Af Amer: 48 mL/min — ABNORMAL LOW (ref >60)
GFR, EST AFRICAN AMERICAN: 56 mL/min — AB (ref >60)
Glucose, Bld: 100 mg/dL — ABNORMAL HIGH (ref 65–99)
Potassium: 4.3 mmol/L (ref 3.5–5.1)
SODIUM: 139 mmol/L (ref 135–145)

## 2015-05-15 LAB — CBC
HCT: 37.8 % (ref 36.0–46.0)
Hemoglobin: 12.3 g/dL (ref 12.0–15.0)
MCH: 28.6 pg (ref 26.0–34.0)
MCHC: 32.5 g/dL (ref 30.0–36.0)
MCV: 87.9 fL (ref 78.0–100.0)
PLATELETS: 260 10*3/uL (ref 150–400)
RBC: 4.3 MIL/uL (ref 3.87–5.11)
RDW: 13.2 % (ref 11.5–15.5)
WBC: 6.9 10*3/uL (ref 4.0–10.5)

## 2015-05-15 LAB — GLUCOSE, CAPILLARY
Glucose-Capillary: 92 mg/dL (ref 65–99)
Glucose-Capillary: 100 mg/dL — ABNORMAL HIGH (ref 65–99)

## 2015-05-15 LAB — HEMOGLOBIN A1C
HEMOGLOBIN A1C: 5.8 % — AB (ref 4.8–5.6)
Mean Plasma Glucose: 120 mg/dL

## 2015-05-15 MED ORDER — VITAMIN B-12 1000 MCG PO TABS
1000.0000 ug | ORAL_TABLET | Freq: Every day | ORAL | Status: DC
  Administered 2015-05-15: 1000 ug via ORAL
  Filled 2015-05-15: qty 1

## 2015-05-15 MED ORDER — SIMVASTATIN 20 MG PO TABS: 20.0000 mg | ORAL_TABLET | Freq: Every day | ORAL | Status: DC

## 2015-05-15 MED ORDER — CYANOCOBALAMIN 1000 MCG PO TABS: 1000.0000 ug | ORAL_TABLET | Freq: Every day | ORAL | Status: DC

## 2015-05-15 MED ORDER — ASPIRIN 81 MG PO TBEC: 81.0000 mg | DELAYED_RELEASE_TABLET | Freq: Every day | ORAL | Status: DC

## 2015-05-15 MED ORDER — METOPROLOL TARTRATE 25 MG PO TABS: 12.5000 mg | ORAL_TABLET | Freq: Two times a day (BID) | ORAL | Status: DC

## 2015-05-15 NOTE — Progress Notes (Signed)
PROGRESS NOTE  [name] WUJ:[id number] DOB: [birth year] DOA: 05/13/2015 PCP: Monique Raider, MD  Assessment/Plan: TIA (transient ischemic attack) MRI negative Neuro Checks Carotid US negative   2D ECHO: EF ok, grade 1 diastolic dysfunction). Fasting Lipids: LDL 151-- add statin   HbA1C: 5.8 Neuro Dr Roseanne Reno saw in ED -EEG pending   -TSH ok   -PT/OT pending   -ASA  Diabetes mellitus- Diet Controlled  HbA1C ok   Hypertension Continue Metoprolol Rx Monitor BPs   Hyperlipidemia LDL elevated  Low B12 -replete    Code Status: full Family Communication: patient Disposition Plan: home later today??   Consultants:  neuro  Procedures:      HPI/Subjective: Want to go home Crocheting a bottle holder  Objective: Filed Vitals:   05/15/15 0553  BP: 122/63  Pulse: 56  Temp: 98 F (36.7 C)  Resp: 18    Intake/Output Summary (Last 24 hours) at 05/15/15 0843 Last data filed at 05/14/15 1700  Gross per 24 hour  Intake    840 ml  Output      0 ml  Net    840 ml   Filed Weights   05/13/15 1708  Weight: 72.576 kg (160 lb)    Exam:   General:  A+Ox, NAD  Cardiovascular: rrr  Respiratory: clear  Abdomen: +BS, soft  Musculoskeletal: no edema   Data Reviewed: Basic Metabolic Panel:  Recent Labs Lab 05/13/15 1715 05/13/15 1726 05/15/15 0426  NA 138 140 139  K 4.2 4.4 4.3  CL 104 104 104  CO2 24  --  28  GLUCOSE 94 91 100*  BUN 19 25* 12  CREATININE 1.05* 1.10* 1.06*  CALCIUM 9.5  --  8.8*   Liver Function Tests:  Recent Labs Lab 05/13/15 1715  AST 17  ALT 12*  ALKPHOS 81  BILITOT 0.4  PROT 7.5  ALBUMIN 4.0   No results for input(s): LIPASE, AMYLASE in the last 168 hours. No results for  input(s): AMMONIA in the last 168 hours. CBC:  Recent Labs Lab 05/13/15 1715 05/13/15 1726 05/15/15 0426  WBC 7.8  --  6.9  NEUTROABS 4.1  --   --   HGB 13.3 15.3* 12.3  HCT 40.1 45.0 37.8  MCV 87.4  --  87.9  PLT 295  --  260   Cardiac Enzymes: No results for input(s): CKTOTAL, CKMB, CKMBINDEX, TROPONINI in the last 168 hours. BNP (last 3 results) No results for input(s): BNP in the last 8760 hours.  ProBNP (last 3 results) No results for input(s): PROBNP in the last 8760 hours.  CBG:  Recent Labs Lab 05/14/15 0627 05/14/15 1120 05/14/15 1623 05/14/15 2153 05/15/15 0726  GLUCAP 100* 89 104* 122* 92    No results found for this or any previous visit (from the past 240 hour(s)).   Studies: Ct Head Wo Contrast  05/13/2015   CLINICAL DATA:  Acute onset confusion and memory loss.  EXAM: CT HEAD WITHOUT CONTRAST  TECHNIQUE: Contiguous axial images were obtained from the base of the skull through the vertex without intravenous contrast.  COMPARISON:  None.  FINDINGS: There is mild diffuse atrophy. There is no intracranial mass, hemorrhage, extra-axial fluid collection, or midline shift. There is an Ace prior lacunar infarct in the medial posterior left cerebellum. A second prior lacunar infarct is noted in the periphery of the left mid cerebellum. A smaller prior appearing lacunar infarct is noted in the mid right cerebellum posteriorly. There is mild small vessel disease in  the centra semiovale bilaterally. No acute infarct is demonstrable on this study. The bony calvarium appears intact. The mastoid air cells are clear.  IMPRESSION: Atrophy with patchy periventricular small vessel disease. Prior small infarcts in the cerebellar hemispheres bilaterally. No acute infarct is apparent currently. No hemorrhage or mass effect.   Electronically Signed   By: Bretta Bang III M.D.   On: 05/13/2015 17:50   Mr Brain Wo Contrast  05/13/2015   CLINICAL DATA:  Acute onset of memory loss.  Beginning today, she has been unable to recall how to find places. She does recall where she is supposed to go. She just does not know how to get there.  EXAM: MRI HEAD WITHOUT CONTRAST  TECHNIQUE: Multiplanar, multiecho pulse sequences of the brain and surrounding structures were obtained without intravenous contrast.  COMPARISON:  CT head without contrast from the same day.  FINDINGS: The diffusion-weighted images demonstrate no evidence for acute or subacute infarction. Bilateral remote cerebellar nonhemorrhagic infarcts are present, left greater than right. Mild generalized atrophy is noted. Mild periventricular and scattered subcortical T2 hyperintensities are present.  Dilated perivascular spaces are noted. Flow is present in the major intracranial arteries.  Bilateral lens replacements are evident. The globes and orbits are intact. Mild mucosal thickening is present in the anterior right ethmoid air cells. The remaining paranasal sinuses and the mastoid air cells are clear.  IMPRESSION: 1. No acute intracranial abnormality. 2. Remote infarcts of the cerebellum bilaterally, more prominent on the left. 3. Mild generalized atrophy and white matter disease likely reflects the sequela of chronic microvascular ischemia.   Electronically Signed   By: Marin Roberts M.D.   On: 05/13/2015 21:26    Scheduled Meds: . aspirin EC  81 mg Oral Daily  . enoxaparin (LOVENOX) injection  40 mg Subcutaneous Q24H  . metoprolol tartrate  12.5 mg Oral BID   Continuous Infusions:  Antibiotics Given (last 72 hours)    None      Principal Problem:   TIA (transient ischemic attack) Active Problems:   Diabetes mellitus   Hyperlipidemia   Borderline diabetes mellitus   Essential hypertension, benign    Time spent: 25 min    [name]  [facility] Pager [phone]. If 7PM-7AM, please contact night-coverage at [email or web address], password [email or web address] 05/15/2015, 8:43 AM

## 2015-05-15 NOTE — Progress Notes (Signed)
Patient is discharged from room 4N24 at this time. Alert and in stable condition. IV site d/c'd as well as tele. Instructions read to [patient and understanding verbalized. Left unit via wheelchair with all belongings and daughter at side.

## 2015-05-15 NOTE — Progress Notes (Addendum)
NEURO HOSPITALIST PROGRESS NOTE   SUBJECTIVE:                                                                                                                        Resting comfortably in bed, reading the newspaper. She said that she is back to her baseline and is wondering when she can go home. Patient indicated to me that " I was doing well until very lately when I started having a lot of trouble remembering the content of conversations over the phone unless I write down what they are telling me". Stated that she has to write everything down and keeps a notebook because otherwise she will forget. MRI brain was personally reviewed and showed no acute abnormality. Bilateral remote cerebellar nonhemorrhagic infarcts are present, left greater than right. Mild generalized atrophy is noted. EEG completed, results pending. CUS showed no hemodynamically significant carotid disease. TTE: ejection fraction was in the range of 55% to 60%. Wall motion was normal. Left atrium was normal in size. Valves structurally normal.  OBJECTIVE:                                                                                                                           Vital signs in last 24 hours: Temp:  [97.8 F (36.6 C)-98.3 F (36.8 C)] 97.8 F (36.6 C) (06/24 0939) Pulse Rate:  [53-64] 56 (06/24 0939) Resp:  [16-19] 16 (06/24 0939) BP: (111-139)/(50-63) 124/53 mmHg (06/24 0939) SpO2:  [95 %-98 %] 97 % (06/24 0939)  Intake/Output from previous day: 06/23 0701 - 06/24 0700 In: 840 [P.O.:840] Out: -  Intake/Output this shift:   Nutritional status: Diet regular Room service appropriate?: Yes; Fluid consistency:: Thin  Past Medical History  Diagnosis Date  . Osteoporosis   . Hyperlipemia   . Diabetes mellitus     no mes, diet only   . Anemia, iron deficiency 12/12/2011  Physical examination: HEENT- Normocephalic, no lesions, without obvious abnormality. Normal  external eye and conjunctiva. Normal TM's bilaterally. Normal auditory canals and external ears. Normal external nose, mucus membranes and septum. Normal pharynx. Neck supple with no masses, nodes, nodules or enlargement. Cardiovascular - regular rate and rhythm, S1, S2 normal, no murmur, click, rub or gallop  Lungs - chest clear, no wheezing, rales, normal symmetric air entry Abdomen - soft, non-tender; bowel sounds normal; no masses, no organomegaly Extremities - no joint deformities, effusion, or inflammation, no edema and no skin discoloration  Neurologic Exam:  Alert, oriented, thought content appropriate. Speech fluent without evidence of aphasia. Able to follow commands without difficulty. Cranial Nerves: II-Visual fields were normal. III/IV/VI-Pupils were equal and reacted normally to light. Extraocular movements were full and conjugate.  V/VII-no facial numbness and no facial weakness. VIII-normal. X-normal speech and symmetrical palatal movement. XI: trapezius strength/neck flexion strength normal bilaterally XII-midline tongue extension with normal strength. Motor: 5/5 bilaterally with normal tone and bulk Sensory: Normal throughout. Deep Tendon Reflexes: 1+ and symmetric. Plantars: Mute bilaterally Cerebellar: Normal finger-to-nose testing.  Lab Results: Lab Results  Component Value Date/Time   CHOL 219* 05/14/2015 05:06 AM   Lipid Panel  Recent Labs  05/14/15 0506  CHOL 219*  TRIG 58  HDL 56  CHOLHDL 3.9  VLDL 12  LDLCALC 098*    Studies/Results: Ct Head Wo Contrast  05/13/2015   CLINICAL DATA:  Acute onset confusion and memory loss.  EXAM: CT HEAD WITHOUT CONTRAST  TECHNIQUE: Contiguous axial images were obtained from the base of the skull through the vertex without intravenous contrast.  COMPARISON:  None.  FINDINGS: There is mild diffuse atrophy. There is no intracranial mass, hemorrhage, extra-axial fluid collection, or midline shift. There is an Ace  prior lacunar infarct in the medial posterior left cerebellum. A second prior lacunar infarct is noted in the periphery of the left mid cerebellum. A smaller prior appearing lacunar infarct is noted in the mid right cerebellum posteriorly. There is mild small vessel disease in the centra semiovale bilaterally. No acute infarct is demonstrable on this study. The bony calvarium appears intact. The mastoid air cells are clear.  IMPRESSION: Atrophy with patchy periventricular small vessel disease. Prior small infarcts in the cerebellar hemispheres bilaterally. No acute infarct is apparent currently. No hemorrhage or mass effect.   Electronically Signed   By: Bretta Bang III M.D.   On: 05/13/2015 17:50   Mr Brain Wo Contrast  05/13/2015   CLINICAL DATA:  Acute onset of memory loss. Beginning today, she has been unable to recall how to find places. She does recall where she is supposed to go. She just does not know how to get there.  EXAM: MRI HEAD WITHOUT CONTRAST  TECHNIQUE: Multiplanar, multiecho pulse sequences of the brain and surrounding structures were obtained without intravenous contrast.  COMPARISON:  CT head without contrast from the same day.  FINDINGS: The diffusion-weighted images demonstrate no evidence for acute or subacute infarction. Bilateral remote cerebellar nonhemorrhagic infarcts are present, left greater than right. Mild generalized atrophy is noted. Mild periventricular and scattered subcortical T2 hyperintensities are present.  Dilated perivascular spaces are noted. Flow is present in the major intracranial arteries.  Bilateral lens replacements are evident. The globes and orbits are intact. Mild mucosal thickening is present in the anterior right ethmoid air cells. The remaining paranasal sinuses and the mastoid air cells are clear.  IMPRESSION: 1. No acute intracranial abnormality. 2. Remote infarcts of the cerebellum bilaterally, more prominent on the left. 3. Mild generalized atrophy  and white matter disease likely reflects the sequela of chronic microvascular ischemia.   Electronically Signed   By: Marin Roberts M.D.   On: 05/13/2015 21:26    MEDICATIONS  Scheduled: . aspirin EC  81 mg Oral Daily  . enoxaparin (LOVENOX) injection  40 mg Subcutaneous Q24H  . metoprolol tartrate  12.5 mg Oral BID  . simvastatin  20 mg Oral q1800  . vitamin B-12  1,000 mcg Oral Daily    ASSESSMENT/PLAN:                                                                                                            81 year old lady admitted with altered mental status with confusion of unclear etiology. Neuro-imaging is unrevealing. EEG pending. Non focal neuro-exam. Historically, patient reports a great deal of difficulty remembering phone conversations and have to write down " almost everything because otherwise I will not be able to remember it". Her episode of transient confusion lasted for several hours, thus doubt a partial complex seizure. Although a TIA is in the differential, her reported short term memory difficulty makes me concern about the possibility of an early cognitive disorder and will recommend that she undergoes a formal cognitive evaluation as outpatient by neurology. Will sign off if EEG normal.  Wyatt Portela, MD [facility] [phone]  05/15/2015, 11:42 AM  Addendum: EEG results noted, occasional independent bitemporal theta and delta slowing. Non specific finding (same findings can be seen in temporal lobe seizures, AD, or normal aging) but in this particular scenario an ambulatory 24 or 48 hour ambulatory EEG could be able to provide further information regarding patient transient but prolonged confusional episode. I am hesitant to start patient on anticonvulsant therapy at this point and time. However, patient advised not to drive  until cleared by neurology.  Wyatt Portela, MD

## 2015-05-15 NOTE — Discharge Summary (Signed)
Physician Discharge Summary  [name] NWG:[id number] DOB: 1932-01-15 DOA: 05/13/2015  PCP: Lupita Raider, MD  Admit date: 05/13/2015 Discharge date: 05/15/2015  Time spent: 35 minutes  Recommendations for Outpatient Follow-up:  Needs 24-48 hour EEG for follow up from EEG done in hospital No driving LFT/FLP in 6 weeks  Discharge Diagnoses:  Principal Problem:   TIA (transient ischemic attack) Active Problems:   Diabetes mellitus   Hyperlipidemia   Borderline diabetes mellitus   Essential hypertension, benign   Discharge Condition: improved  Diet recommendation: cardiac  Filed Weights   05/13/15 1708  Weight: 72.576 kg (160 lb)    History of present illness:  [name] is a 81 y.o. female with a history of Diet Controlled DM2, HTN, Hyperlipidemia who was referred to the ED after she had called with complaints of sudden onset of confusion and memory loss when she got into her car this AM. 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 She denied having any headache or dizziness or chest pain. She was evaluated in the ED and seen by Neurology and a CT scan of the Head and MRI of the brain were performed and were negtative for acute findings. She reported being back to her baseline while she was in the ED. A TIA Workup was initiated.  Hospital Course:  82 year old lady admitted with altered mental status with confusion of unclear etiology. Neuro-imaging is unrevealing.Non focal neuro-exam. Historically, patient reports a great deal of difficulty remembering phone conversations and have to write down " almost everything because otherwise I will not be able to remember it". Her episode of transient confusion lasted for several hours, thus doubt a partial complex seizure. Although a TIA is in the differential, her reported short term memory difficulty concerns about the possibility of an  early cognitive disorder and will recommend that she undergoes a formal cognitive evaluation as outpatient by neurology. Spoke with neuro re EEG: in this particular scenario an ambulatory 24 or 48 hour ambulatory EEG could be able to provide further information regarding patient transient but prolonged confusional episode. I am hesitant to start patient on anticonvulsant therapy at this point and time.  patient advised not to drive until cleared by neurology.  Procedures:  EEG  echo  Consultations:  neuro  Discharge Exam: Filed Vitals:   05/15/15 0939  BP: 124/53  Pulse: 56  Temp: 97.8 F (36.6 C)  Resp: 16    General: A+Ox3, NAD   Discharge Instructions   Discharge Instructions    Diet - low sodium heart healthy    Complete by:  As directed      Discharge instructions    Complete by:  As directed   Will need 24-48 EEG for further management No driving until seen by neurology and given ok     Increase activity slowly    Complete by:  As directed           Current Discharge Medication List    START taking these medications   Details  aspirin EC 81 MG EC tablet Take 1 tablet (81 mg total) by mouth daily.    simvastatin (ZOCOR) 20 MG tablet Take 1 tablet (20 mg total) by mouth daily at 6 PM. Qty: 30 tablet, Refills: 0    vitamin B-12 1000 MCG tablet Take 1 tablet (1,000 mcg total) by mouth daily. Qty: 30 tablet, Refills: 0  CONTINUE these medications which have CHANGED   Details  metoprolol tartrate (LOPRESSOR) 25 MG tablet Take 0.5 tablets (12.5 mg total) by mouth 2 (two) times daily. Qty: 60 tablet, Refills: 0       Allergies  Allergen Reactions  . Sulfa Antibiotics Hives, Diarrhea and Nausea And Vomiting    Facial swelling  . Latex Rash   Follow-up Information    Follow up with SHAW,KIMBERLEE, MD In 1 week.   Specialty:  Family Medicine   Contact information:   [address] [place] [address] [phone]       Follow  up with Van Clines, MD.   Specialty:  Neurology   Contact information:   [address] [place] [address] [phone]        The results of significant diagnostics from this hospitalization (including imaging, microbiology, ancillary and laboratory) are listed below for reference.    Significant Diagnostic Studies: Ct Head Wo Contrast  05/13/2015   CLINICAL DATA:  Acute onset confusion and memory loss.  EXAM: CT HEAD WITHOUT CONTRAST  TECHNIQUE: Contiguous axial images were obtained from the base of the skull through the vertex without intravenous contrast.  COMPARISON:  None.  FINDINGS: There is mild diffuse atrophy. There is no intracranial mass, hemorrhage, extra-axial fluid collection, or midline shift. There is an Ace prior lacunar infarct in the medial posterior left cerebellum. A second prior lacunar infarct is noted in the periphery of the left mid cerebellum. A smaller prior appearing lacunar infarct is noted in the mid right cerebellum posteriorly. There is mild small vessel disease in the centra semiovale bilaterally. No acute infarct is demonstrable on this study. The bony calvarium appears intact. The mastoid air cells are clear.  IMPRESSION: Atrophy with patchy periventricular small vessel disease. Prior small infarcts in the cerebellar hemispheres bilaterally. No acute infarct is apparent currently. No hemorrhage or mass effect.   Electronically Signed   By: Bretta Bang III M.D.   On: 05/13/2015 17:50   Mr Brain Wo Contrast  05/13/2015   CLINICAL DATA:  Acute onset of memory loss. Beginning today, she has been unable to recall how to find places. She does recall where she is supposed to go. She just does not know how to get there.  EXAM: MRI HEAD WITHOUT CONTRAST  TECHNIQUE: Multiplanar, multiecho pulse sequences of the brain and surrounding structures were obtained without intravenous contrast.  COMPARISON:  CT head without contrast from the same day.   FINDINGS: The diffusion-weighted images demonstrate no evidence for acute or subacute infarction. Bilateral remote cerebellar nonhemorrhagic infarcts are present, left greater than right. Mild generalized atrophy is noted. Mild periventricular and scattered subcortical T2 hyperintensities are present.  Dilated perivascular spaces are noted. Flow is present in the major intracranial arteries.  Bilateral lens replacements are evident. The globes and orbits are intact. Mild mucosal thickening is present in the anterior right ethmoid air cells. The remaining paranasal sinuses and the mastoid air cells are clear.  IMPRESSION: 1. No acute intracranial abnormality. 2. Remote infarcts of the cerebellum bilaterally, more prominent on the left. 3. Mild generalized atrophy and white matter disease likely reflects the sequela of chronic microvascular ischemia.   Electronically Signed   By: Marin Roberts M.D.   On: 05/13/2015 21:26    Microbiology: No results found for this or any previous visit (from the past 240 hour(s)).   Labs: Basic Metabolic Panel:  Recent Labs Lab 05/13/15 1715 05/13/15 1726  05/15/15 0426  NA 138 140 139  K 4.2 4.4 4.3  CL 104 104 104  CO2 24  --  28  GLUCOSE 94 91 100*  BUN 19 25* 12  CREATININE 1.05* 1.10* 1.06*  CALCIUM 9.5  --  8.8*   Liver Function Tests:  Recent Labs Lab 05/13/15 1715  AST 17  ALT 12*  ALKPHOS 81  BILITOT 0.4  PROT 7.5  ALBUMIN 4.0   No results for input(s): LIPASE, AMYLASE in the last 168 hours. No results for input(s): AMMONIA in the last 168 hours. CBC:  Recent Labs Lab 05/13/15 1715 05/13/15 1726 05/15/15 0426  WBC 7.8  --  6.9  NEUTROABS 4.1  --   --   HGB 13.3 15.3* 12.3  HCT 40.1 45.0 37.8  MCV 87.4  --  87.9  PLT 295  --  260   Cardiac Enzymes: No results for input(s): CKTOTAL, CKMB, CKMBINDEX, TROPONINI in the last 168 hours. BNP: BNP (last 3 results) No results for input(s): BNP in the last 8760 hours.  ProBNP  (last 3 results) No results for input(s): PROBNP in the last 8760 hours.  CBG:  Recent Labs Lab 05/14/15 1120 05/14/15 1623 05/14/15 2153 05/15/15 0726 05/15/15 1152  GLUCAP 89 104* 122* 92 100*       Signed:  [place]  [facility] 05/15/2015, 2:15 PM

## 2015-05-15 NOTE — Progress Notes (Signed)
EEG completed, results pending. 

## 2015-05-15 NOTE — Procedures (Signed)
ELECTROENCEPHALOGRAM REPORT  Date of Study: 05/15/2015  Patient's Name: [name] MRN: [id number] Date of Birth: October 24, 1932  Referring Provider: Dr. Wyatt Portela  Clinical History: This is an 81 year old woman with acute confusion.   Medications: aspirin EC tablet 81 mg enoxaparin (LOVENOX) injection 40 mg metoprolol tartrate (LOPRESSOR) tablet 12.5 mg simvastatin (ZOCOR) tablet 20 mg vitamin B-12 (CYANOCOBALAMIN) tablet 1,000 mcg  Technical Summary: A multichannel digital EEG recording measured by the international 10-20 system with electrodes applied with paste and impedances below 5000 ohms performed in our laboratory with EKG monitoring in an awake and asleep patient.  Hyperventilation was not performed. Photic stimulation was performed.  The digital EEG was referentially recorded, reformatted, and digitally filtered in a variety of bipolar and referential montages for optimal display.    Description: The patient is awake and asleep during the recording.  During maximal wakefulness, there is a symmetric, medium voltage 9.5-10 Hz posterior dominant rhythm that attenuates with eye opening.  There is occasional independent focal theta and delta slowing seen over the bilateral temporal regions. During drowsiness and stage I sleep, there is an increase in theta slowing of the background with occasional vertex waves seen.  Photic stimulation did not elicit any abnormalities.  There were no epileptiform discharges or electrographic seizures seen.    EKG lead was unremarkable.  Impression: This awake and asleep EEG is abnormal due to occasional independent focal slowing seen over the bilateral temporal regions.  Clinical Correlation of the above findings indicates focal cerebral dysfunction over the bilateral temporal regions suggestive of underlying structural or physiologic abnormality. The absence of epileptiform discharges does not exclude a clinical diagnosis of epilepsy.  Clinical correlation is advised.   Patrcia Dolly, M.D.

## 2015-05-15 NOTE — Progress Notes (Signed)
OT Cancellation Note and Discharge  Patient Details Name: [name] MRN: [id number] DOB: [birth year]   Cancelled Treatment:    Reason Eval/Treat Not Completed: OT screened, no needs identified, will sign off. Got message from PT that pt is back to baseline and is independent.  Evette Georges [phone] 05/15/2015, 11:51 AM

## 2015-05-15 NOTE — Evaluation (Signed)
Physical Therapy Evaluation Patient Details Name: [name] MRN: [id number] DOB: 1932/08/09 Today's Date: 05/15/2015   History of Present Illness  81 y.o. female history diabetes mellitus, hyperlipidemia, osteoporosis and anemia who was sent to the emergency room by her private physician for evaluation of acute confusion  Clinical Impression  Patient mobilizing well, able to perform cognitive tasks during mobility. Independent with all activity. No further acute PT needs, will sign off.    Follow Up Recommendations No PT follow up    Equipment Recommendations  None recommended by PT    Recommendations for Other Services       Precautions / Restrictions        Mobility  Bed Mobility Overal bed mobility: Independent                Transfers Overall transfer level: Independent                  Ambulation/Gait Ambulation/Gait assistance: Independent Ambulation Distance (Feet): 410 Feet Assistive device: None Gait Pattern/deviations: WFL(Within Functional Limits)   Gait velocity interpretation: at or above normal speed for age/gender General Gait Details: steady with gait  Stairs Stairs: Yes Stairs assistance: Modified independent (Device/Increase time) Stair Management: Forwards;One rail Right Number of Stairs: 4 General stair comments: no physical assist   Wheelchair Mobility    Modified Rankin (Stroke Patients Only)       Balance Overall balance assessment: Needs assistance                               Standardized Balance Assessment Standardized Balance Assessment : Dynamic Gait Index   Dynamic Gait Index Level Surface: Normal Change in Gait Speed: Mild Impairment Gait with Horizontal Head Turns: Normal Gait with Vertical Head Turns: Normal Gait and Pivot Turn: Mild Impairment Step Over Obstacle: Normal Step Around Obstacles: Mild Impairment Steps: Mild Impairment Total Score: 20       Pertinent Vitals/Pain       Home Living Family/patient expects to be discharged to:: Private residence Living Arrangements: Alone   Type of Home: House Home Access: Level entry     Home Layout: One level Home Equipment: Shower seat - built in (walk in shower)      Prior Function Level of Independence: Independent               Hand Dominance   Dominant Hand: Left    Extremity/Trunk Assessment   Upper Extremity Assessment: Overall WFL for tasks assessed           Lower Extremity Assessment: Overall WFL for tasks assessed         Communication   Communication: HOH (glasses)  Cognition Arousal/Alertness: Awake/alert Behavior During Therapy: WFL for tasks assessed/performed Overall Cognitive Status: Within Functional Limits for tasks assessed                      General Comments      Exercises        Assessment/Plan    PT Assessment Patent does not need any further PT services  PT Diagnosis Altered mental status   PT Problem List    PT Treatment Interventions     PT Goals (Current goals can be found in the Care Plan section) Acute Rehab PT Goals PT Goal Formulation: All assessment and education complete, DC therapy    Frequency     Barriers to discharge  Co-evaluation               End of Session Equipment Utilized During Treatment: Gait belt Activity Tolerance: Patient tolerated treatment well Patient left: in bed;with call bell/phone within reach      Functional Assessment Tool Used: dgi Functional Limitation: Mobility: Walking and moving around Mobility: Walking and Moving Around Current Status (G9562): At least 1 percent but less than 20 percent impaired, limited or restricted Mobility: Walking and Moving Around Goal Status [phone]): At least 1 percent but less than 20 percent impaired, limited or restricted Mobility: Walking and Moving Around Discharge Status [phone]): At least 1 percent but less than 20 percent impaired, limited or  restricted    Time: 0948-1006 PT Time Calculation (min) (ACUTE ONLY): 18 min   Charges:   PT Evaluation $Initial PT Evaluation Tier I: 1 Procedure     PT G Codes:   PT G-Codes **NOT FOR INPATIENT CLASS** Functional Assessment Tool Used: dgi Functional Limitation: Mobility: Walking and moving around Mobility: Walking and Moving Around Current Status (N6295): At least 1 percent but less than 20 percent impaired, limited or restricted Mobility: Walking and Moving Around Goal Status [phone]): At least 1 percent but less than 20 percent impaired, limited or restricted Mobility: Walking and Moving Around Discharge Status [phone]): At least 1 percent but less than 20 percent impaired, limited or restricted    Fabio Asa 05/15/2015, 10:10 AM Charlotte Crumb, PT DPT  [phone]

## 2015-06-17 ENCOUNTER — Encounter: Payer: Self-pay | Admitting: Neurology

## 2015-06-17 ENCOUNTER — Ambulatory Visit (INDEPENDENT_AMBULATORY_CARE_PROVIDER_SITE_OTHER): Payer: No Typology Code available for payment source | Admitting: Neurology

## 2015-06-17 VITALS — BP 118/62 | HR 53 | Resp 16 | Ht 62.0 in | Wt 162.0 lb

## 2015-06-17 DIAGNOSIS — F05 Delirium due to known physiological condition: Secondary | ICD-10-CM | POA: Diagnosis not present

## 2015-06-17 DIAGNOSIS — R413 Other amnesia: Secondary | ICD-10-CM | POA: Diagnosis not present

## 2015-06-17 NOTE — Patient Instructions (Signed)
1. Schedule 24-hour EEG 2. Continue daily Aricept 3. Continue all other medications 4. Follow-up after EEG

## 2015-06-17 NOTE — Progress Notes (Signed)
NEUROLOGY CONSULTATION NOTE  [name] MRN: [id number] DOB: 1932/05/13  Referring provider: Dr. Lupita Raider Primary care provider: Dr. Lupita Raider  Reason for consult:  Memory loss  Dear Dr Clelia Croft:  Thank you for your kind referral of [name] for consultation of the above symptoms. Although her history is well known to you, please allow me to reiterate it for the purpose of our medical record. The patient was accompanied to the clinic by her friend who also provides collateral information. Records and images were personally reviewed where available.  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 She used to work at her PCP's office and called asking to be seen for sudden onset memory loss. When she got to the parking lot, she recalls sitting in her car, repeatedly calling Amy's name (works in the office). They were able to get a hold of Amy, who brought her up to the clinic. She recognized the office and was then instructed to go to [facility] where she was admitted for sudden change in mental status. Bloodwork showed unremarkable CBC, CMP. Her B12 level was low at 125, TSH normal, lipid panel showed LDL 151, total cholesterol 219. Urinalysis showed 7-10 WBC with moderate leukocytes. I personally reviewed MRI brain without contrast which showed mild diffuse atrophy. White matter change from chronic microvascular disease was mild. Her routine EEG showed occasional independent focal slowing over the bilateral temporal regions, no epileptiform discharges seen. Echo showed an EF 55-60%, mild LVH, normal left atrium. She knew she was in the hospital but was "just not myself," and was discharged home with neurology follow-up, with concern that symptoms  were related to an early cognitive disorder and formal cognitive evaluation was recommended. TIA and partial seizure were considered. Since her hospital discharge, she denies having any more problems with not recognizing where she is, but she continues to feel that her short-term memory is not good. She writes everything down and does not recall the context of phone conversations 5 minutes after talking on the phone. Per PCP notes, she reported on follow-up that she doesn't feel safe to drive because she cannot remember how to get to places, but her friend reported she actually did drive to the drug store and could not remember how to get home and had to call a friend. She did not remember this at all, even after discussed twice with her in the office. Her friend started noticing memory changes in 2012 where she would repeat herself a few times. Her friend was with the patient the day before the event, and she seemed fine, she would write everything down to remind herself. Her daughter in [place] calls her twice a day to remind her to take her medications, she feels that she would remember to take it even without the phone calls. There was some concern however if she was taking her medications regularly, due to elevated BP. Her friend got her a pillbox recently, which helps. All her bills are on autopay. She mostly microwaves food and does takeout. She denies any difficulties with ADLs. Her mother had Alzheimer's disease in her 42s, her oldest sister was also diagnosed with Alzheimer's disease in her late 68s. She denies any history of head injuries, no alcohol  use.  She denies any headaches, dizziness, vision changes, diplopia, dysarthria, dysphagia, neck/back pain, focal numbness/tingling/weakness, bowel/bladder dysfunction, no anosmia or tremors. She denies any falls. She denies any olfactory/gustatory hallucinations, deja vu, rising epigastric sensation, myoclonic jerks. Her friend denies any  staring/unresponsive episodes. Her friend feels that cognition is similar to prior to hospital stay, she continues to have short-term memory issues, they were in the pharmacy and she asked what they came in for, and asked where they were going. The patient however denies any further episodes where she is unable to recognize where she is. She has been started on Aricept, which she is tolerating without side effects.  She had a normal birth and early development.  There is no history of febrile convulsions, CNS infections such as meningitis/encephalitis, significant traumatic brain injury, neurosurgical procedures, or family history of seizures.  PAST MEDICAL HISTORY: Past Medical History  Diagnosis Date  . Osteoporosis   . Hyperlipemia   . Diabetes mellitus     no mes, diet only   . Anemia, iron deficiency 12/12/2011    PAST SURGICAL HISTORY: Past Surgical History  Procedure Laterality Date  . Tonsillectomy    . Left knee arthroscopy    . Other surgical history      left small toe bone spur removed   . Orif humerus fracture  12/09/2011    Procedure: OPEN REDUCTION INTERNAL FIXATION (ORIF) DISTAL HUMERUS FRACTURE;  Surgeon: Sharma Covert, MD;  Location: [facility];  Service: Orthopedics;  Laterality: Right;    MEDICATIONS: Current Outpatient Prescriptions on File Prior to Visit  Medication Sig Dispense Refill  . aspirin EC 81 MG EC tablet Take 1 tablet (81 mg total) by mouth daily.    . metoprolol tartrate (LOPRESSOR) 25 MG tablet Take 0.5 tablets (12.5 mg total) by mouth 2 (two) times daily. 60 tablet 0  . simvastatin (ZOCOR) 20 MG tablet Take 1 tablet (20 mg total) by mouth daily at 6 PM. 30 tablet 0  . vitamin B-12 1000 MCG tablet Take 1 tablet (1,000 mcg total) by mouth daily. 30 tablet 0   No current facility-administered medications on file prior to visit.    ALLERGIES: Allergies  Allergen Reactions  . Sulfa Antibiotics Hives, Diarrhea and Nausea And Vomiting    Facial swelling   . Latex Rash    FAMILY HISTORY: No family history on file.  SOCIAL HISTORY: History   Social History  . Marital Status: Divorced    Spouse Name: N/A  . Number of Children: 2  . Years of Education: N/A   Occupational History  . Retired    Social History Main Topics  . Smoking status: Former Smoker    Types: Cigarettes    Quit date: 12/29/2009  . Smokeless tobacco: Never Used  . Alcohol Use: No  . Drug Use: No  . Sexual Activity: No   Other Topics Concern  . Not on file   Social History Narrative    REVIEW OF SYSTEMS: Constitutional: No fevers, chills, or sweats, no generalized fatigue, change in appetite Eyes: No visual changes, double vision, eye pain Ear, nose and throat: No hearing loss, ear pain, nasal congestion, sore throat Cardiovascular: No chest pain, palpitations Respiratory:  No shortness of breath at rest or with exertion, wheezes GastrointestinaI: No nausea, vomiting, diarrhea, abdominal pain, fecal incontinence Genitourinary:  No dysuria, urinary retention or frequency Musculoskeletal:  No neck pain, back pain Integumentary: No rash, pruritus, skin lesions Neurological: as above Psychiatric: No depression, insomnia, anxiety  Endocrine: No palpitations, fatigue, diaphoresis, mood swings, change in appetite, change in weight, increased thirst Hematologic/Lymphatic:  No anemia, purpura, petechiae. Allergic/Immunologic: no itchy/runny eyes, nasal congestion, recent allergic reactions, rashes  PHYSICAL EXAM: Filed Vitals:   06/17/15 1247  BP: 118/62  Pulse: 53  Resp: 16   General: No acute distress Head:  Normocephalic/atraumatic Eyes: Fundoscopic exam shows bilateral sharp discs, no vessel changes, exudates, or hemorrhages Neck: supple, no paraspinal tenderness, full range of motion Back: No paraspinal tenderness Heart: regular rate and rhythm Lungs: Clear to auscultation bilaterally. Vascular: No carotid bruits. Skin/Extremities: No rash, no  edema Neurological Exam: Mental status: alert and oriented to person, place, and time, no dysarthria or aphasia, Fund of knowledge is appropriate.  Recent and remote memory are intact.  Attention and concentration are normal.    Able to name objects and repeat phrases.  Montreal Cognitive Assessment  06/17/2015  Visuospatial/ Executive (0/5) 5  Naming (0/3) 3  Attention: Read list of digits (0/2) 2  Attention: Read list of letters (0/1) 1  Attention: Serial 7 subtraction starting at 100 (0/3) 3  Language: Repeat phrase (0/2) 2  Language : Fluency (0/1) 1  Abstraction (0/2) 2  Delayed Recall (0/5) 4  Orientation (0/6) 5  Total 28  Adjusted Score (based on education) 28   Cranial nerves: CN I: not tested CN II: pupils equal, round and reactive to light, visual fields intact, fundi unremarkable. CN III, IV, VI:  full range of motion, no nystagmus, no ptosis CN V: facial sensation intact CN VII: upper and lower face symmetric CN VIII: hearing intact to finger rub CN IX, X: gag intact, uvula midline CN XI: sternocleidomastoid and trapezius muscles intact CN XII: tongue midline Bulk & Tone: normal, no cogwheeling, no fasciculations. Motor: 5/5 throughout with no pronator drift. Sensation: intact to light touch, cold, pin, vibration and joint position sense.  No extinction to double simultaneous stimulation.  Romberg test negative Deep Tendon Reflexes: +1 throughout, no ankle clonus Plantar responses: downgoing bilaterally Cerebellar: no incoordination on finger to nose, heel to shin. No dysdiadochokinesia Gait: narrow-based and steady, able to tandem walk adequately. Tremor: none  IMPRESSION: This is a pleasant 81 year old left-handed woman with a history of hypertension, hyperlipidemia, presenting for worsening memory with an episode of significant confusion last month where she could not recognize where she was. She reports that memory continues to be an issue, however she denies any  further spells where she could not recognize her surroundings. She did quite well with MOCA testing today, normal at 28/30, however by witness accounts, she does have cognitive impairment. Her MRI brain does not show significant vascular disease. Fluctuations in cognition can be seen early in the course of Dementia with Lewy bodies, episodes can last seconds to days, and interspersed with periods of near-normal function. Less likely TIA and seizure. Her routine EEG was mildly abnormal with bilateral temporal slowing, we discussed doing a 24-hour EEG to assess for focal abnormalities that increase risk for recurrent seizures. She has stopped driving. She is looking into moving into assisted living, which I recommend as well. Continue Aricept 5mg  daily. She will follow-up after the EEG.  Thank you for allowing me to participate in the care of this patient. Please do not hesitate to call for any questions or concerns.   Patrcia Dolly, M.D.  CC: Dr. Clelia Croft

## 2015-06-19 DIAGNOSIS — R413 Other amnesia: Secondary | ICD-10-CM | POA: Insufficient documentation

## 2015-06-19 DIAGNOSIS — F05 Delirium due to known physiological condition: Secondary | ICD-10-CM | POA: Insufficient documentation

## 2015-06-19 NOTE — Progress Notes (Signed)
Note routed to Dr. Shaw 

## 2015-06-24 ENCOUNTER — Ambulatory Visit (INDEPENDENT_AMBULATORY_CARE_PROVIDER_SITE_OTHER): Payer: Medicare Other | Admitting: Neurology

## 2015-06-24 DIAGNOSIS — F05 Delirium due to known physiological condition: Secondary | ICD-10-CM

## 2015-06-24 DIAGNOSIS — R413 Other amnesia: Secondary | ICD-10-CM | POA: Diagnosis not present

## 2015-07-03 NOTE — Procedures (Signed)
ELECTROENCEPHALOGRAM REPORT  Dates of Recording: 06/24/2015 to 06/25/2015  Patient's Name: [name] MRN: [id number] Date of Birth: 21-Jun-1932  Referring Provider: Dr. Patrcia Dolly  Procedure: 24-hour ambulatory EEG  History: This is an 81 year old woman with an episode of significant confusion where she could not recognize where she was. Routine EEG was mildly abnormal with bilateral temporal slowing.   Medications: aspirin, Lopressor, Zocor, B12  Technical Summary: This is a 24-hour multichannel digital EEG recording measured by the international 10-20 system with electrodes applied with paste and impedances below 5000 ohms performed as portable with EKG monitoring.  The digital EEG was referentially recorded, reformatted, and digitally filtered in a variety of bipolar and referential montages for optimal display.    DESCRIPTION OF RECORDING: During maximal wakefulness, the background activity consisted of a symmetric 9 Hz posterior dominant rhythm which was reactive to eye opening.  There were no epileptiform discharges or focal slowing seen in wakefulness.  During the recording, the patient progresses through wakefulness, drowsiness, and Stage 2 sleep.  During drowsiness and sleep, there is an increase in theta and delta slowing, with shifting asymmetry over the bilateral temporal region, left greater than right. Again, there were no epileptiform discharges seen.  Events: There were no push button events. Patient did not complete diary.  There were no electrographic seizures seen.  EKG lead was unremarkable.  IMPRESSION: This 24-hour ambulatory EEG study is within normal limits for age.  CLINICAL CORRELATION: A normal EEG does not exclude a clinical diagnosis of epilepsy.  If further clinical questions remain, inpatient video EEG monitoring may be helpful.   Patrcia Dolly, M.D.

## 2015-07-06 ENCOUNTER — Ambulatory Visit: Payer: Medicare Other | Admitting: Neurology

## 2015-07-07 ENCOUNTER — Telehealth: Payer: Self-pay | Admitting: Family Medicine

## 2015-07-07 NOTE — Telephone Encounter (Signed)
Patient was notified of result. 

## 2015-07-07 NOTE — Telephone Encounter (Signed)
-----   Message from Van Clines, MD sent at 07/03/2015  2:10 PM EDT ----- Pls let her know EEG was unremarkable, no evidence of seizure activity. Thanks

## 2015-07-14 ENCOUNTER — Ambulatory Visit (INDEPENDENT_AMBULATORY_CARE_PROVIDER_SITE_OTHER): Payer: Medicare Other | Admitting: Neurology

## 2015-07-14 ENCOUNTER — Encounter: Payer: Self-pay | Admitting: Neurology

## 2015-07-14 VITALS — BP 112/62 | HR 57 | Resp 16 | Ht 62.0 in | Wt 166.0 lb

## 2015-07-14 DIAGNOSIS — G3184 Mild cognitive impairment, so stated: Secondary | ICD-10-CM | POA: Diagnosis not present

## 2015-07-14 NOTE — Patient Instructions (Signed)
1. Increase Aricept to  daily 2. Physical exercise and brain stimulation exercises are important for brain health 3. Follow-up in 6 months, call our office for any changes

## 2015-07-14 NOTE — Progress Notes (Signed)
NEUROLOGY FOLLOW UP OFFICE NOTE  [name] [id number]  HISTORY OF PRESENT ILLNESS: I had the pleasure of seeing [name] in follow-up in the neurology clinic on 07/14/2015.  The patient was last seen a month ago after she had a confusional episode last June 2016. She is accompanied by her long-time friend who is her medical POA who helps supplement the history today.  Records and images were personally reviewed where available.  Her 24-hour EEG bitemporal slowing with shifting asymmetry, within normal limits for age, no epileptiform discharges. She denies any similar confusional episodes, but does feel her memory has worsened some. She would leave her apartment and forget where she was going. She has moved into an ALF 3 weeks ago where medications are administered. She gets 30 minutes exercises. She denies any headaches, dizziness, diplopia, focal numbness/tingling/weakness, no falls. She is on Aricept /day with no side effects.  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 She used to work at her PCP's office and called asking to be seen for sudden onset memory loss. When she got to the parking lot, she recalls sitting in her car, repeatedly calling Amy's name (works in the office). They were able to get a hold of Amy, who brought her up to the clinic. She recognized the office and was then instructed to go to [facility] where she was admitted for sudden change in mental status. Bloodwork showed unremarkable CBC, CMP. Her B12 level was low at 125, TSH normal, lipid panel showed LDL 151, total cholesterol 219. Urinalysis showed 7-10 WBC with moderate leukocytes. I personally reviewed MRI brain without contrast which showed mild diffuse atrophy. White matter change from chronic  microvascular disease was mild. Her routine EEG showed occasional independent focal slowing over the bilateral temporal regions, no epileptiform discharges seen. Echo showed an EF 55-60%, mild LVH, normal left atrium. She knew she was in the hospital but was "just not myself," and was discharged home with neurology follow-up, with concern that symptoms were related to an early cognitive disorder and formal cognitive evaluation was recommended. TIA and partial seizure were considered. Since her hospital discharge, she denies having any more problems with not recognizing where she is, but she continues to feel that her short-term memory is not good. She writes everything down and does not recall the context of phone conversations 5 minutes after talking on the phone. Per PCP notes, she reported on follow-up that she doesn't feel safe to drive because she cannot remember how to get to places, but her friend reported she actually did drive to the drug store and could not remember how to get home and had to call a friend. She did not remember this at all, even after discussed twice with her in the office. Her friend started noticing memory changes in 2012 where she would repeat herself a few times. Her friend was with the patient the day before the event, and she seemed fine, she would write everything down to remind herself. Her daughter in [place] calls her twice a day to remind her to take her medications, she feels that she would remember to take it even without the phone calls. There was some concern however if she was taking her medications regularly, due to elevated BP. Her  friend got her a pillbox recently, which helps. All her bills are on autopay. She mostly microwaves food and does takeout. She denies any difficulties with ADLs. Her mother had Alzheimer's disease in her 56s, her oldest sister was also diagnosed with Alzheimer's disease in her late 43s. She denies any history of head injuries, no alcohol  use.  She had a normal birth and early development. There is no history of febrile convulsions, CNS infections such as meningitis/encephalitis, significant traumatic brain injury, neurosurgical procedures, or family history of seizures.  PAST MEDICAL HISTORY: Past Medical History  Diagnosis Date  . Osteoporosis   . Hyperlipemia   . Diabetes mellitus     no mes, diet only   . Anemia, iron deficiency 12/12/2011    MEDICATIONS: Current Outpatient Prescriptions on File Prior to Visit  Medication Sig Dispense Refill  . aspirin EC 81 MG EC tablet Take 1 tablet (81 mg total) by mouth daily.    Marland Kitchen donepezil (ARICEPT) 5 MG tablet 5 mg. Take 1 tablet daily  3  . metoprolol tartrate (LOPRESSOR) 25 MG tablet Take 0.5 tablets (12.5 mg total) by mouth 2 (two) times daily. (Patient taking differently: Take 25 mg by mouth 2 (two) times daily. ) 60 tablet 0  . simvastatin (ZOCOR) 20 MG tablet Take 1 tablet (20 mg total) by mouth daily at 6 PM. 30 tablet 0  . vitamin B-12 1000 MCG tablet Take 1 tablet (1,000 mcg total) by mouth daily. (Patient taking differently: Take 1,000 mcg by mouth 2 (two) times daily. ) 30 tablet 0   No current facility-administered medications on file prior to visit.    ALLERGIES: Allergies  Allergen Reactions  . Sulfa Antibiotics Hives, Diarrhea and Nausea And Vomiting    Facial swelling  . Latex Rash    FAMILY HISTORY: No family history on file.  SOCIAL HISTORY: Social History   Social History  . Marital Status: Divorced    Spouse Name: N/A  . Number of Children: 2  . Years of Education: N/A   Occupational History  . Retired    Social History Main Topics  . Smoking status: Former Smoker    Types: Cigarettes    Quit date: 12/29/2009  . Smokeless tobacco: Never Used  . Alcohol Use: No  . Drug Use: No  . Sexual Activity: No   Other Topics Concern  . Not on file   Social History Narrative    REVIEW OF SYSTEMS: Constitutional: No fevers, chills, or  sweats, no generalized fatigue, change in appetite Eyes: No visual changes, double vision, eye pain Ear, nose and throat: No hearing loss, ear pain, nasal congestion, sore throat Cardiovascular: No chest pain, palpitations Respiratory:  No shortness of breath at rest or with exertion, wheezes GastrointestinaI: No nausea, vomiting, diarrhea, abdominal pain, fecal incontinence Genitourinary:  No dysuria, urinary retention or frequency Musculoskeletal:  No neck pain, back pain Integumentary: No rash, pruritus, skin lesions Neurological: as above Psychiatric: No depression, insomnia, anxiety Endocrine: No palpitations, fatigue, diaphoresis, mood swings, change in appetite, change in weight, increased thirst Hematologic/Lymphatic:  No anemia, purpura, petechiae. Allergic/Immunologic: no itchy/runny eyes, nasal congestion, recent allergic reactions, rashes  PHYSICAL EXAM: Filed Vitals:   07/14/15 1522  BP: 112/62  Pulse: 57  Resp: 16   General: No acute distress Head:  Normocephalic/atraumatic Neck: supple, no paraspinal tenderness, full range of motion Heart:  Regular rate and rhythm Lungs:  Clear to auscultation bilaterally Back: No paraspinal tenderness Skin/Extremities: No rash, no edema Neurological  Exam: alert and oriented to person, place, and time. Did not know day of week. No aphasia or dysarthria. Fund of knowledge is appropriate.  Remote memory intact.  Attention and concentration are normal.    Able to name objects and repeat phrases.  MMSE - Mini Mental State Exam 07/14/2015  Orientation to time 4  Orientation to Place 4  Registration 3  Attention/ Calculation 4  Recall 1  Language- name 2 objects 2  Language- repeat 1  Language- follow 3 step command 3  Language- read & follow direction 1  Write a sentence 1  Copy design 1  Total score 25   Cranial nerves: Pupils equal, round, reactive to light.  Fundoscopic exam unremarkable, no papilledema. Extraocular movements  intact with no nystagmus. Visual fields full. Facial sensation intact. No facial asymmetry. Tongue, uvula, palate midline.  Motor: Bulk and tone normal, muscle strength 5/5 throughout with no pronator drift.  Sensation to light touch intact.  No extinction to double simultaneous stimulation.  Deep tendon reflexes +1 throughout, toes downgoing.  Finger to nose testing intact.  Gait narrow-based and steady, able to tandem walk adequately.  Romberg negative.  IMPRESSION: This is a pleasant 81 yo LH woman with a history of hypertension, hyperlipidemia, who presented for worsening memory with an episode of significant confusion last June 2016 where she could not recognize where she was. Her MOCA score was normal 28/30 on her last visit, she feels her memory is worsening, MMSE today 25/30, indicating mild cognitive impairment. MRI brain unremarkable. Her 24-hour EEG was within normal limits for age, no epileptiform discharges seen. Findings were discussed with the patient, confusional episode most likely related to underlying neurodegenerative disorder. Fluctuations in cognition can be seen early in the course of Dementia with Lewy bodies, continue to monitor symptoms. Her Aricept dose will be increased to 10mg  daily. She is now in ALF and does not drive. We discussed the importance of control of vascular risk factors, physical exercise, and brain stimulation exercises for brain health. She will follow-up in 6 months and knows to call our office for any changes.   Thank you for allowing me to participate in her care.  Please do not hesitate to call for any questions or concerns.  The duration of this appointment visit was 24 minutes of face-to-face time with the patient.  Greater than 50% of this time was spent in counseling, explanation of diagnosis, planning of further management, and coordination of care.   Patrcia Dolly, M.D.   CC: Dr. Clelia Croft

## 2015-07-15 ENCOUNTER — Encounter: Payer: Self-pay | Admitting: Neurology

## 2015-07-15 DIAGNOSIS — G3184 Mild cognitive impairment, so stated: Secondary | ICD-10-CM | POA: Insufficient documentation

## 2015-09-07 ENCOUNTER — Telehealth: Payer: Self-pay | Admitting: Neurology

## 2015-09-07 NOTE — Telephone Encounter (Signed)
Returned call. Explained to her that we are unable to speak with her without having a signed DPR by patient allowing us to speak with her about patient.

## 2015-09-07 NOTE — Telephone Encounter (Signed)
PT's daughter Cira Ruerlene called in regards to getting a letter from Dr Karel JarvisAquino stating PT's mental status/Dawn CB# [phone]

## 2016-01-19 ENCOUNTER — Ambulatory Visit: Payer: Medicare Other | Admitting: Neurology

## 2016-02-04 ENCOUNTER — Encounter: Payer: Self-pay | Admitting: Neurology

## 2016-02-04 ENCOUNTER — Ambulatory Visit (INDEPENDENT_AMBULATORY_CARE_PROVIDER_SITE_OTHER): Payer: Medicare Other | Admitting: Neurology

## 2016-02-04 VITALS — BP 120/68 | HR 54 | Ht 62.0 in | Wt 171.3 lb

## 2016-02-04 DIAGNOSIS — G3184 Mild cognitive impairment, so stated: Secondary | ICD-10-CM

## 2016-02-04 MED ORDER — DONEPEZIL HCL 10 MG PO TABS: 10.0000 mg | ORAL_TABLET | Freq: Every day | ORAL | Status: DC

## 2016-02-04 NOTE — Progress Notes (Addendum)
NEUROLOGY FOLLOW UP OFFICE NOTE  [name] [id number]  HISTORY OF PRESENT ILLNESS: I had the pleasure of seeing [name] in follow-up in the neurology clinic on 02/04/2016.  The patient was last seen 7 months ago after she had a confusional episode last June 2016. She is again accompanied by her long-time friend who is her medical POA who helps supplement the history today. MMSE in August 2016 was 25/30. She is tolerating Aricept  daily without side effects. Since her last visit, they deny any further acute confusional episodes, however they both feel that her memory is maybe a little worse. She would have a conversation on the phone and forget about what they talked about 5 minutes later. Her friend reports she called her earlier that she would pick her up and eat, but came 30 minutes later and found she had forgotten about this. Her daughter in [place] takes care of her finances. The ALF administers her medications. She does not drive. She was started on Zoloft after patient and friends reported that she mostly stays in her room and does not participate in activities. She denies any headaches, dizziness, diplopia, dysarthria/dysphagia, no falls. She has some left-sided neck pain and feels like her "carotid artery is cramping," resolves when she turns head back to neutral.   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 She used to work at her PCP's office and called asking to be seen for sudden onset memory loss. When she got to the parking lot, she recalls sitting in her car, repeatedly calling [name]'s name (works in the office). They were able to get a hold of [name], who brought her up to the clinic. She recognized the office and was then instructed to go to [facility] where she was  admitted for sudden change in mental status. Bloodwork showed unremarkable CBC, CMP. Her B12 level was low at 125, TSH normal, lipid panel showed LDL 151, total cholesterol 219. Urinalysis showed 7-10 WBC with moderate leukocytes. I personally reviewed MRI brain without contrast which showed mild diffuse atrophy. White matter change from chronic microvascular disease was mild. Her routine EEG showed occasional independent focal slowing over the bilateral temporal regions, no epileptiform discharges seen. Echo showed an EF 55-60%, mild LVH, normal left atrium. She knew she was in the hospital but was "just not myself," and was discharged home with neurology follow-up, with concern that symptoms were related to an early cognitive disorder and formal cognitive evaluation was recommended. TIA and partial seizure were considered. Since her hospital discharge, she denies having any more problems with not recognizing where she is, but she continues to feel that her short-term memory is not good. She writes everything down and does not recall the context of phone conversations 5 minutes after talking on the phone. Per PCP notes, she reported on follow-up that she doesn't feel safe to drive because she cannot remember how to get to places, but her friend reported she actually did drive to the drug store and could not remember how to get home and had to call a friend. She did not remember this at all, even after discussed twice with her in the office. Her friend started noticing memory changes in 2012 where she would repeat herself a few times. Her friend  was with the patient the day before the event, and she seemed fine, she would write everything down to remind herself. Her daughter in [place] calls her twice a day to remind her to take her medications, she feels that she would remember to take it even without the phone calls. There was some concern however if she was taking her medications regularly, due to elevated  BP. Her friend got her a pillbox recently, which helps. All her bills are on autopay. She mostly microwaves food and does takeout. She denies any difficulties with ADLs. Her mother had Alzheimer's disease in her 35s, her oldest sister was also diagnosed with Alzheimer's disease in her late 80s. She denies any history of head injuries, no alcohol use.  She had a normal birth and early development. There is no history of febrile convulsions, CNS infections such as meningitis/encephalitis, significant traumatic brain injury, neurosurgical procedures, or family history of seizures.  Diagnostic Data: Her 24-hour EEG bitemporal slowing with shifting asymmetry, within normal limits for age, no epileptiform discharges.  MRI brain without contrast which showed mild diffuse atrophy. White matter change from chronic microvascular disease was mild.  PAST MEDICAL HISTORY: Past Medical History  Diagnosis Date  . Osteoporosis   . Hyperlipemia   . Diabetes mellitus     no mes, diet only   . Anemia, iron deficiency 12/12/2011    MEDICATIONS: Current Outpatient Prescriptions on File Prior to Visit  Medication Sig Dispense Refill  . aspirin EC 81 MG EC tablet Take 1 tablet (81 mg total) by mouth daily.    Marland Kitchen donepezil (ARICEPT) 510 MG tablet 10 mg. Take 1 tablet daily  3  . metoprolol tartrate (LOPRESSOR) 25 MG tablet Take 0.5 tablets (12.5 mg total) by mouth 2 (two) times daily. (Patient taking differently: Take 25 mg by mouth 2 (two) times daily. ) 60 tablet 0  . simvastatin (ZOCOR) 20 MG tablet Take 1 tablet (20 mg total) by mouth daily at 6 PM. 30 tablet 0  . vitamin B-12 1000 MCG tablet Take 1 tablet (1,000 mcg total) by mouth daily. (Patient taking differently: Take 1,000 mcg by mouth 2 (two) times daily. ) 30 tablet 0   No current facility-administered medications on file prior to visit.    ALLERGIES: Allergies  Allergen Reactions  . Sulfa Antibiotics Hives, Diarrhea and Nausea And Vomiting     Facial swelling  . Latex Rash    FAMILY HISTORY: No family history on file.  SOCIAL HISTORY: Social History   Social History  . Marital Status: Divorced    Spouse Name: N/A  . Number of Children: 2  . Years of Education: N/A   Occupational History  . Retired    Social History Main Topics  . Smoking status: Former Smoker    Types: Cigarettes    Quit date: 12/29/2009  . Smokeless tobacco: Never Used  . Alcohol Use: No  . Drug Use: No  . Sexual Activity: No   Other Topics Concern  . Not on file   Social History Narrative    REVIEW OF SYSTEMS: Constitutional: No fevers, chills, or sweats, no generalized fatigue, change in appetite Eyes: No visual changes, double vision, eye pain Ear, nose and throat: No hearing loss, ear pain, nasal congestion, sore throat Cardiovascular: No chest pain, palpitations Respiratory:  No shortness of breath at rest or with exertion, wheezes GastrointestinaI: No nausea, vomiting, diarrhea, abdominal pain, fecal incontinence Genitourinary:  No dysuria, urinary retention or frequency Musculoskeletal:  +  neck pain,no back pain Integumentary: No rash, pruritus, skin lesions Neurological: as above Psychiatric: No depression, insomnia, anxiety Endocrine: No palpitations, fatigue, diaphoresis, mood swings, change in appetite, change in weight, increased thirst Hematologic/Lymphatic:  No anemia, purpura, petechiae. Allergic/Immunologic: no itchy/runny eyes, nasal congestion, recent allergic reactions, rashes  PHYSICAL EXAM: Filed Vitals:   02/04/16 1320  BP: 120/68  Pulse: 54   General: No acute distress Head:  Normocephalic/atraumatic Neck: supple, no paraspinal tenderness, full range of motion Heart:  Regular rate and rhythm Lungs:  Clear to auscultation bilaterally Back: No paraspinal tenderness Skin/Extremities: No rash, no edema Neurological Exam: alert and oriented to person, place, and month/year. She was just off one day for date  and day of week. No aphasia or dysarthria. Fund of knowledge is appropriate.  Remote memory intact.  Attention and concentration are normal.    Able to name objects and repeat phrases. CDT 4/5. MMSE - Mini Mental State Exam 02/04/2016 07/14/2015  Orientation to time 3 4  Orientation to Place 4 4  Registration 3 3  Attention/ Calculation 5 4  Recall 2 1  Language- name 2 objects 2 2  Language- repeat 1 1  Language- follow 3 step command 3 3  Language- read & follow direction 1 1  Write a sentence 1 1  Copy design 1 1  Total score 26 25   Cranial nerves: Pupils equal, round, reactive to light. Extraocular movements intact with no nystagmus. Visual fields full. Facial sensation intact. No facial asymmetry. Tongue, uvula, palate midline.  Motor: Bulk and tone normal, muscle strength 5/5 throughout with no pronator drift.  Sensation to light touch intact.  No extinction to double simultaneous stimulation.  Deep tendon reflexes +1 throughout, toes downgoing.  Finger to nose testing intact.  Gait narrow-based and steady, able to tandem walk adequately.  Romberg negative.  IMPRESSION: This is a pleasant 82 yo LH woman with a history of hypertension, hyperlipidemia, who presented for worsening memory with an episode of significant confusion last June 2016 where she could not recognize where she was. She and her POA report slight worsening of memory since her last visit, MMSE today is 26/30 (previously 25/30 in August 2016), again indicating mild cognitive impairment. MRI brain unremarkable. Her 24-hour EEG was within normal limits for age, no epileptiform discharges seen. No further confusional episodes, which was most likely related to underlying neurodegenerative disorder. Fluctuations in cognition can be seen early in the course of Dementia with Lewy bodies, she does not have any visual hallucinations or Parkinsonism, continue to monitor symptoms. Continue Aricept 10mg  daily. She does not drive. We again  discussed the importance of control of vascular risk factors, physical exercise, and brain stimulation exercises for brain health. She will follow-up in 1 year and knows to call our office for any changes.   Thank you for allowing me to participate in her care.  Please do not hesitate to call for any questions or concerns.  The duration of this appointment visit was 25 minutes of face-to-face time with the patient.  Greater than 50% of this time was spent in counseling, explanation of diagnosis, planning of further management, and coordination of care.   Patrcia DollyKaren Evagelia Knack, M.D.   CC: Dr. Clelia CroftShaw

## 2016-02-04 NOTE — Patient Instructions (Signed)
1. Continue Aricept 10mg daily 2. Physical exercise and brain stimulation exercises are important for brain health 3. Follow-up in 1 year 

## 2016-04-22 ENCOUNTER — Encounter: Payer: Self-pay | Admitting: Neurology

## 2016-12-27 ENCOUNTER — Emergency Department (HOSPITAL_COMMUNITY): Payer: Medicare Other

## 2016-12-27 ENCOUNTER — Emergency Department (HOSPITAL_COMMUNITY)
Admission: EM | Admit: 2016-12-27 | Discharge: 2016-12-27 | Disposition: A | Discharge status: Home / Self Care | Payer: Medicare Other | Attending: Emergency Medicine | Admitting: Emergency Medicine

## 2016-12-27 ENCOUNTER — Encounter (HOSPITAL_COMMUNITY): Payer: Self-pay | Admitting: *Deleted

## 2016-12-27 DIAGNOSIS — E119 Type 2 diabetes mellitus without complications: Secondary | ICD-10-CM | POA: Insufficient documentation

## 2016-12-27 DIAGNOSIS — Z87891 Personal history of nicotine dependence: Secondary | ICD-10-CM | POA: Insufficient documentation

## 2016-12-27 DIAGNOSIS — W06XXXA Fall from bed, initial encounter: Secondary | ICD-10-CM | POA: Insufficient documentation

## 2016-12-27 DIAGNOSIS — Y9389 Activity, other specified: Secondary | ICD-10-CM | POA: Insufficient documentation

## 2016-12-27 DIAGNOSIS — Y999 Unspecified external cause status: Secondary | ICD-10-CM | POA: Diagnosis not present

## 2016-12-27 DIAGNOSIS — S42251A Displaced fracture of greater tuberosity of right humerus, initial encounter for closed fracture: Secondary | ICD-10-CM | POA: Diagnosis not present

## 2016-12-27 DIAGNOSIS — T148XXA Other injury of unspecified body region, initial encounter: Secondary | ICD-10-CM | POA: Diagnosis not present

## 2016-12-27 DIAGNOSIS — Y92129 Unspecified place in nursing home as the place of occurrence of the external cause: Secondary | ICD-10-CM | POA: Diagnosis not present

## 2016-12-27 DIAGNOSIS — S4991XA Unspecified injury of right shoulder and upper arm, initial encounter: Secondary | ICD-10-CM | POA: Diagnosis present

## 2016-12-27 DIAGNOSIS — R259 Unspecified abnormal involuntary movements: Secondary | ICD-10-CM | POA: Diagnosis not present

## 2016-12-27 DIAGNOSIS — S42201A Unspecified fracture of upper end of right humerus, initial encounter for closed fracture: Secondary | ICD-10-CM

## 2016-12-27 DIAGNOSIS — S42009A Fracture of unspecified part of unspecified clavicle, initial encounter for closed fracture: Secondary | ICD-10-CM | POA: Diagnosis not present

## 2016-12-27 DIAGNOSIS — Z7982 Long term (current) use of aspirin: Secondary | ICD-10-CM | POA: Insufficient documentation

## 2016-12-27 DIAGNOSIS — M25511 Pain in right shoulder: Secondary | ICD-10-CM | POA: Diagnosis not present

## 2016-12-27 MED ORDER — OXYCODONE-ACETAMINOPHEN 5-325 MG PO TABS
1.0000 | ORAL_TABLET | Freq: Once | ORAL | Status: AC
  Administered 2016-12-27: 1 via ORAL
  Filled 2016-12-27: qty 1

## 2016-12-27 MED ORDER — DOCUSATE SODIUM 100 MG PO CAPS: 100.0000 mg | ORAL_CAPSULE | Freq: Two times a day (BID) | ORAL | 0 refills | Status: DC

## 2016-12-27 MED ORDER — ONDANSETRON 4 MG PO TBDP
4.0000 mg | ORAL_TABLET | Freq: Once | ORAL | Status: AC
  Administered 2016-12-27: 4 mg via ORAL
  Filled 2016-12-27: qty 1

## 2016-12-27 MED ORDER — OXYCODONE-ACETAMINOPHEN 5-325 MG PO TABS: 1.0000 | ORAL_TABLET | Freq: Four times a day (QID) | ORAL | 0 refills | Status: DC | PRN

## 2016-12-27 NOTE — ED Notes (Signed)
Ortho at bedside placing shoulder immobilizer.

## 2016-12-27 NOTE — ED Triage Notes (Signed)
Pt is from an assisted living facility c/o L shoulder pain after rolling out of bed and landing onto shoulder. Bruising noted, limited ROM, pt guarding

## 2016-12-27 NOTE — ED Provider Notes (Signed)
By signing my name below, I, Freida Busman, attest that this documentation has been prepared under the direction and in the presence of Tayanna Talford N Demetrius Mahler, DO . Electronically Signed: Freida Busman, Scribe. 12/27/2016. 4:06 AM.  TIME SEEN: 3:36 AM  CHIEF COMPLAINT:  Chief Complaint  Patient presents with  . Shoulder Pain  . Fall     HPI:   HPI Comments:  [name] is a 82 y.o. left hand dominant  female who presents to the Emergency Department s/p mechanical fall PTA complaining of moderate-severe right shoulder pain following the accident. Pt states she rolled out of bed and landed on the ground; denies head injury. She states she has no other acute complaints, symptoms, or injuries  at this time. No alleviating factors noted. No use of anticoagulants Or antiplatelets. Denies chest pain, abdominal pain, neck or back pain. No numbness or focal weakness.   ROS: See HPI Constitutional: no fever  Eyes: no drainage  ENT: no runny nose   Cardiovascular:  no chest pain  Resp: no SOB  GI: no vomiting GU: no dysuria Integumentary: no rash  Allergy: no hives  Musculoskeletal: no leg swelling  Neurological: no slurred speech ROS otherwise negative  PAST MEDICAL HISTORY/PAST SURGICAL HISTORY:  Past Medical History:  Diagnosis Date  . Anemia, iron deficiency 12/12/2011  . Diabetes mellitus    no mes, diet only   . Hyperlipemia   . Osteoporosis     MEDICATIONS:  Prior to Admission medications   Medication Sig Start Date End Date Taking? Authorizing Provider  aspirin EC 81 MG EC tablet Take 1 tablet (81 mg total) by mouth daily. 05/15/15   Joseph Art, DO  donepezil (ARICEPT) 10 MG tablet Take 1 tablet (10 mg total) by mouth at bedtime. 02/04/16   Van Clines, MD  guaifenesin (ROBITUSSIN) 100 MG/5ML syrup Take 200 mg by mouth 3 (three) times daily as needed for cough.    Historical Provider, MD  meloxicam (MOBIC) 15 MG tablet Take 15 mg by mouth daily.    Historical Provider,  MD  metoprolol tartrate (LOPRESSOR) 25 MG tablet Take 0.5 tablets (12.5 mg total) by mouth 2 (two) times daily. Patient taking differently: Take 25 mg by mouth 2 (two) times daily.  05/15/15   Joseph Art, DO  sertraline (ZOLOFT) 50 MG tablet Take 100 mg by mouth at bedtime.    Historical Provider, MD  simvastatin (ZOCOR) 20 MG tablet Take 1 tablet (20 mg total) by mouth daily at 6 PM. 05/15/15   Joseph Art, DO  vitamin B-12 1000 MCG tablet Take 1 tablet (1,000 mcg total) by mouth daily. Patient taking differently: Take 1,000 mcg by mouth 2 (two) times daily.  05/15/15   Joseph Art, DO    ALLERGIES:  Allergies  Allergen Reactions  . Sulfa Antibiotics Hives, Diarrhea and Nausea And Vomiting    Facial swelling  . Latex Rash    SOCIAL HISTORY:  Social History  Substance Use Topics  . Smoking status: Former Smoker    Types: Cigarettes    Quit date: 12/29/2009  . Smokeless tobacco: Never Used  . Alcohol use No    FAMILY HISTORY: No family history on file.  EXAM: BP (!) 147/51   Pulse (!) 49   Temp 97.6 F (36.4 C) (Oral)   Resp 16   SpO2 97%  CONSTITUTIONAL: Alert and oriented and responds appropriately to questions. Well-appearing; well-nourished; GCS 15 Elderly HEAD: Normocephalic; atraumatic EYES: Conjunctivae clear,  PERRL, EOMI ENT: normal nose; no rhinorrhea; moist mucous membranes; pharynx without lesions noted; no dental injury; no septal hematoma NECK: Supple, no meningismus, no LAD; no midline spinal tenderness, step-off or deformity; trachea midline CARD: RRR; S1 and S2 appreciated; no murmurs, no clicks, no rubs, no gallops RESP: Normal chest excursion without splinting or tachypnea; breath sounds clear and equal bilaterally; no wheezes, no rhonchi, no rales; no hypoxia or respiratory distress CHEST:  chest wall stable, no crepitus or ecchymosis or deformity, nontender to palpation; no flail chest ABD/GI: Normal bowel sounds; non-distended; soft, non-tender,  no rebound, no guarding; no ecchymosis or other lesions noted PELVIS:  stable, nontender to palpation BACK:  The back appears normal and is non-tender to palpation, there is no CVA tenderness; no midline spinal tenderness, step-off or deformity EXT: TTP over the proximal right humerus with associated swelling and ecchymosis without loss of fullness of the shoulder; normal capillary refill; no cyanosis, no joint effusion, compartments are soft, extremities are warm and well-perfused, otherwise extremities are nontender to palpation. No Lacerations. 2+ radial pulses bilaterally; Normal grip strength in the right hand, normal sensation in the right arm. No tenderness over the right elbow, forearm, wrist or hand. SKIN: Normal color for age and race; warm NEURO: Moves all extremities, sensation to light touch intact diffusely, cranial nerves II through XII intact PSYCH: The patient's mood and manner are appropriate. Grooming and personal hygiene are appropriate.    MEDICAL DECISION MAKING: Patient here for nursing facility after she fell out of bed. Has proximal right humerus fracture on x-ray. Neurovascular intact distally. No other sign of trauma. Will provide Percocet for pain and reassess. Adamantly denies head injury. No antiplatelets or anticoagulants. Neurologically intact and hemodynamically stable.  ED PROGRESS: Patient reports pain has markedly improved after Percocet. Blood pressure has dropped slightly which is likely from being given narcotics.  Does not feel dizzy. No recent infectious symptoms. No chest pain or shortness of breath. I feel she is safe to be discharged back to her nursing facility. Placed in shoulder immobilizer and we'll give patient orthopedic follow-up. Recommended rest, elevation and ice. Discussed return precautions. She is comfortable with this plan.   At this time, I do not feel there is any life-threatening condition present. I have reviewed and discussed all results  (EKG, imaging, lab, urine as appropriate) and exam findings with patient/family. I have reviewed nursing notes and appropriate previous records.  I feel the patient is safe to be discharged home without further emergent workup and can continue workup as an outpatient as needed. Discussed usual and customary return precautions. Patient/family verbalize understanding and are comfortable with this plan.  Outpatient follow-up has been provided. All questions have been answered.   I personally performed the services described in this documentation, which was scribed in my presence. The recorded information has been reviewed and is accurate.    Layla MawKristen N Asami Lambright, DO 12/27/16 [phone]

## 2016-12-30 ENCOUNTER — Emergency Department (HOSPITAL_COMMUNITY)
Admission: EM | Admit: 2016-12-30 | Discharge: 2016-12-31 | Disposition: A | Discharge status: Home / Self Care | Payer: Medicare Other | Attending: Emergency Medicine | Admitting: Emergency Medicine

## 2016-12-30 ENCOUNTER — Emergency Department (HOSPITAL_COMMUNITY): Payer: Medicare Other

## 2016-12-30 ENCOUNTER — Encounter (HOSPITAL_COMMUNITY): Payer: Self-pay | Admitting: Emergency Medicine

## 2016-12-30 DIAGNOSIS — T148XXA Other injury of unspecified body region, initial encounter: Secondary | ICD-10-CM | POA: Diagnosis not present

## 2016-12-30 DIAGNOSIS — Z7982 Long term (current) use of aspirin: Secondary | ICD-10-CM | POA: Insufficient documentation

## 2016-12-30 DIAGNOSIS — Y999 Unspecified external cause status: Secondary | ICD-10-CM | POA: Insufficient documentation

## 2016-12-30 DIAGNOSIS — S40011A Contusion of right shoulder, initial encounter: Secondary | ICD-10-CM | POA: Insufficient documentation

## 2016-12-30 DIAGNOSIS — Y929 Unspecified place or not applicable: Secondary | ICD-10-CM | POA: Insufficient documentation

## 2016-12-30 DIAGNOSIS — Z87891 Personal history of nicotine dependence: Secondary | ICD-10-CM | POA: Diagnosis not present

## 2016-12-30 DIAGNOSIS — Z91040 Latex allergy status: Secondary | ICD-10-CM | POA: Diagnosis not present

## 2016-12-30 DIAGNOSIS — M25511 Pain in right shoulder: Secondary | ICD-10-CM | POA: Diagnosis not present

## 2016-12-30 DIAGNOSIS — S4991XA Unspecified injury of right shoulder and upper arm, initial encounter: Secondary | ICD-10-CM | POA: Diagnosis present

## 2016-12-30 DIAGNOSIS — S299XXA Unspecified injury of thorax, initial encounter: Secondary | ICD-10-CM | POA: Diagnosis not present

## 2016-12-30 DIAGNOSIS — E119 Type 2 diabetes mellitus without complications: Secondary | ICD-10-CM | POA: Diagnosis not present

## 2016-12-30 DIAGNOSIS — Y939 Activity, unspecified: Secondary | ICD-10-CM | POA: Insufficient documentation

## 2016-12-30 DIAGNOSIS — W06XXXA Fall from bed, initial encounter: Secondary | ICD-10-CM | POA: Insufficient documentation

## 2016-12-30 DIAGNOSIS — Z8673 Personal history of transient ischemic attack (TIA), and cerebral infarction without residual deficits: Secondary | ICD-10-CM | POA: Diagnosis not present

## 2016-12-30 DIAGNOSIS — Z79899 Other long term (current) drug therapy: Secondary | ICD-10-CM | POA: Diagnosis not present

## 2016-12-30 DIAGNOSIS — R410 Disorientation, unspecified: Secondary | ICD-10-CM | POA: Insufficient documentation

## 2016-12-30 DIAGNOSIS — N3000 Acute cystitis without hematuria: Secondary | ICD-10-CM

## 2016-12-30 DIAGNOSIS — I10 Essential (primary) hypertension: Secondary | ICD-10-CM | POA: Diagnosis not present

## 2016-12-30 DIAGNOSIS — R296 Repeated falls: Secondary | ICD-10-CM | POA: Diagnosis not present

## 2016-12-30 LAB — CBC WITH DIFFERENTIAL/PLATELET
BASOS ABS: 0 10*3/uL (ref 0.0–0.1)
BASOS PCT: 0 %
EOS ABS: 0.2 10*3/uL (ref 0.0–0.7)
EOS PCT: 2 %
HCT: 39 % (ref 36.0–46.0)
Hemoglobin: 12.5 g/dL (ref 12.0–15.0)
LYMPHS PCT: 15 %
Lymphs Abs: 1.6 10*3/uL (ref 0.7–4.0)
MCH: 28.7 pg (ref 26.0–34.0)
MCHC: 32.1 g/dL (ref 30.0–36.0)
MCV: 89.4 fL (ref 78.0–100.0)
MONO ABS: 0.6 10*3/uL (ref 0.1–1.0)
Monocytes Relative: 6 %
Neutro Abs: 7.8 10*3/uL — ABNORMAL HIGH (ref 1.7–7.7)
Neutrophils Relative %: 77 %
Platelets: 236 10*3/uL (ref 150–400)
RBC: 4.36 MIL/uL (ref 3.87–5.11)
RDW: 13.2 % (ref 11.5–15.5)
WBC: 10.2 10*3/uL (ref 4.0–10.5)

## 2016-12-30 LAB — URINALYSIS, ROUTINE W REFLEX MICROSCOPIC
Bilirubin Urine: NEGATIVE
GLUCOSE, UA: NEGATIVE mg/dL
KETONES UR: 5 mg/dL — AB
Nitrite: POSITIVE — AB
PROTEIN: NEGATIVE mg/dL
Specific Gravity, Urine: 1.015 (ref 1.005–1.030)
pH: 5 (ref 5.0–8.0)

## 2016-12-30 LAB — BASIC METABOLIC PANEL
ANION GAP: 14 (ref 5–15)
BUN: 31 mg/dL — ABNORMAL HIGH (ref 6–20)
CALCIUM: 9.4 mg/dL (ref 8.9–10.3)
CO2: 24 mmol/L (ref 22–32)
Chloride: 99 mmol/L — ABNORMAL LOW (ref 101–111)
Creatinine, Ser: 1.32 mg/dL — ABNORMAL HIGH (ref 0.44–1.00)
GFR calc Af Amer: 42 mL/min — ABNORMAL LOW (ref >60)
GFR, EST NON AFRICAN AMERICAN: 36 mL/min — AB (ref >60)
GLUCOSE: 127 mg/dL — AB (ref 65–99)
Potassium: 4.2 mmol/L (ref 3.5–5.1)
SODIUM: 137 mmol/L (ref 135–145)

## 2016-12-30 MED ORDER — FOSFOMYCIN TROMETHAMINE 3 G PO PACK
3.0000 g | PACK | Freq: Once | ORAL | Status: AC
  Administered 2016-12-30: 3 g via ORAL
  Filled 2016-12-30: qty 3

## 2016-12-30 NOTE — ED Notes (Signed)
Pt transported to xray 

## 2016-12-30 NOTE — ED Provider Notes (Addendum)
[facility]-EMERGENCY DEPT Provider Note   CSN: [id number] Arrival date & time: 12/30/16  2155     History   Chief Complaint Chief Complaint  Patient presents with  . Fall    HPI [name] is a 83 y.o. female.  83 yo F with a chief complaint of a fall. Patient is unsure exactly what happened. Has a history of dementia. She says she is very forgetful. She initially told me that she rolled out of bed. When I told her that she had rolled out of bed a couple days ago she then became confused about what was going on. So that her right shoulder hurts. I told her that we have artery x-ray and she has a proximal humerus fracture 2 days ago. She denies any other areas of pain. The nursing home was concerned that she may be more confused than her baseline. Level V caveat dementia.    The history is provided by the patient, the EMS personnel and the nursing home.  Fall  This is a new problem. The current episode started less than 1 hour ago. The problem occurs constantly. The problem has not changed since onset.Pertinent negatives include no chest pain, no headaches and no shortness of breath. Nothing aggravates the symptoms. Nothing relieves the symptoms. She has tried nothing for the symptoms.    Past Medical History:  Diagnosis Date  . Anemia, iron deficiency 12/12/2011  . Diabetes mellitus    no mes, diet only   . Hyperlipemia   . Osteoporosis     Patient Active Problem List   Diagnosis Date Noted  . Mild cognitive impairment 07/15/2015  . Acute confusional state 06/19/2015  . Memory loss 06/19/2015  . Diabetes mellitus 05/14/2015  . Hyperlipidemia 05/14/2015  . Borderline diabetes mellitus   . Essential hypertension, benign   . TIA (transient ischemic attack) 05/13/2015  . Anemia, iron deficiency 12/12/2011  . Humerus fracture 12/11/2011  . Hypoxia 12/11/2011  . Wide-complex tachycardia (HCC) 12/11/2011  . Hypotension 12/11/2011    Past Surgical History:  Procedure  Laterality Date  . left knee arthroscopy    . ORIF HUMERUS FRACTURE  12/09/2011   Procedure: OPEN REDUCTION INTERNAL FIXATION (ORIF) DISTAL HUMERUS FRACTURE;  Surgeon: Sharma Covert, MD;  Location: [facility];  Service: Orthopedics;  Laterality: Right;  . OTHER SURGICAL HISTORY     left small toe bone spur removed   . TONSILLECTOMY      OB History    No data available       Home Medications    Prior to Admission medications   Medication Sig Start Date End Date Taking? Authorizing Provider  acetaminophen (MAPAP) 500 MG tablet Take 500 mg by mouth 2 (two) times daily as needed for headache.   Yes Historical Provider, MD  aspirin EC 81 MG EC tablet Take 1 tablet (81 mg total) by mouth daily. 05/15/15  Yes Joseph Art, DO  Calcium Carbonate-Vitamin D3 (CALCIUM 600-D) 600-400 MG-UNIT TABS Take 2 tablets by mouth every morning.   Yes Historical Provider, MD  docusate sodium (COLACE) 100 MG capsule Take 1 capsule (100 mg total) by mouth every 12 (twelve) hours. 12/27/16  Yes Kristen N Ward, DO  guaifenesin (ROBAFEN) 100 MG/5ML syrup Take 100 mg by mouth every 4 (four) hours as needed for cough or congestion.   Yes Historical Provider, MD  lisinopril (PRINIVIL,ZESTRIL) 10 MG tablet Take 10 mg by mouth daily.   Yes Historical Provider, MD  Melatonin 3 MG  TABS Take 3 mg by mouth at bedtime.   Yes Historical Provider, MD  meloxicam (MOBIC) 15 MG tablet Take 15 mg by mouth daily.   Yes Historical Provider, MD  memantine (NAMENDA) 10 MG tablet Take 10 mg by mouth 2 (two) times daily.   Yes Historical Provider, MD  metoprolol tartrate (LOPRESSOR) 25 MG tablet Take 0.5 tablets (12.5 mg total) by mouth 2 (two) times daily. Patient taking differently: Take 25 mg by mouth 2 (two) times daily.  05/15/15  Yes Joseph Art, DO  oxyCODONE-acetaminophen (PERCOCET/ROXICET) 5-325 MG tablet Take 1 tablet by mouth every 6 (six) hours as needed. Patient taking differently: Take 1 tablet by mouth every 6 (six) hours  as needed (for pain).  12/27/16  Yes Kristen N Ward, DO  sertraline (ZOLOFT) 100 MG tablet Take 100 mg by mouth at bedtime.   Yes Historical Provider, MD  simvastatin (ZOCOR) 20 MG tablet Take 1 tablet (20 mg total) by mouth daily at 6 PM. Patient taking differently: Take 20 mg by mouth at bedtime.  05/15/15  Yes Joseph Art, DO  vitamin B-12 1000 MCG tablet Take 1 tablet (1,000 mcg total) by mouth daily. Patient taking differently: Take 1,000 mcg by mouth 2 (two) times daily.  05/15/15  Yes Jessica U Vann, DO  donepezil (ARICEPT) 10 MG tablet Take 1 tablet (10 mg total) by mouth at bedtime. Patient not taking: Reported on 12/30/2016 02/04/16   Van Clines, MD    Family History No family history on file.  Social History Social History  Substance Use Topics  . Smoking status: Former Smoker    Types: Cigarettes    Quit date: 12/29/2009  . Smokeless tobacco: Never Used  . Alcohol use No     Allergies   Sulfa antibiotics and Latex   Review of Systems Review of Systems  Constitutional: Negative for chills and fever.  HENT: Negative for congestion and rhinorrhea.   Eyes: Negative for redness and visual disturbance.  Respiratory: Negative for shortness of breath and wheezing.   Cardiovascular: Negative for chest pain and palpitations.  Gastrointestinal: Negative for nausea and vomiting.  Genitourinary: Negative for dysuria and urgency.  Musculoskeletal: Positive for arthralgias and myalgias.  Skin: Negative for pallor and wound.  Neurological: Negative for dizziness and headaches.     Physical Exam Updated Vital Signs BP 170/72   Pulse 79   Temp 97.7 F (36.5 C) (Oral)   Resp 19   SpO2 92%   Physical Exam  Constitutional: She is oriented to person, place, and time. She appears well-developed and well-nourished. No distress.  HENT:  Head: Normocephalic and atraumatic.  Eyes: EOM are normal. Pupils are equal, round, and reactive to light.  Neck: Normal range of motion.  Neck supple.  Cardiovascular: Normal rate and regular rhythm.  Exam reveals no gallop and no friction rub.   No murmur heard. Pulmonary/Chest: Effort normal. She has no wheezes. She has no rales. She exhibits no tenderness.  Abdominal: Soft. She exhibits no distension. There is no tenderness. There is no guarding.  Musculoskeletal: She exhibits no edema or tenderness.  Bruising and pain to the right proximal humerus. Pulse motor and sensation is intact distally. Patient palpated from head to toe with no other noted areas of bony tenderness.  Neurological: She is alert and oriented to person, place, and time.  Skin: Skin is warm and dry. She is not diaphoretic.  Psychiatric: She has a normal mood and affect. Her behavior is  normal.  Nursing note and vitals reviewed.    ED Treatments / Results  Labs (all labs ordered are listed, but only abnormal results are displayed) Labs Reviewed  CBC WITH DIFFERENTIAL/PLATELET - Abnormal; Notable for the following:       Result Value   Neutro Abs 7.8 (*)    All other components within normal limits  BASIC METABOLIC PANEL - Abnormal; Notable for the following:    Chloride 99 (*)    Glucose, Bld 127 (*)    BUN 31 (*)    Creatinine, Ser 1.32 (*)    GFR calc non Af Amer 36 (*)    GFR calc Af Amer 42 (*)    All other components within normal limits  URINALYSIS, ROUTINE W REFLEX MICROSCOPIC - Abnormal; Notable for the following:    Hgb urine dipstick MODERATE (*)    Ketones, ur 5 (*)    Nitrite POSITIVE (*)    Leukocytes, UA SMALL (*)    Bacteria, UA RARE (*)    Squamous Epithelial / LPF 0-5 (*)    All other components within normal limits    EKG  EKG Interpretation  Date/Time:  Friday December 30 2016 22:00:40 EST Ventricular Rate:  77 PR Interval:    QRS Duration: 92 QT Interval:  388 QTC Calculation: 440 R Axis:   54 Text Interpretation:  Sinus rhythm Borderline T abnormalities, anterior leads No significant change since last tracing  Confirmed by Laqueta Bonaventura MD, DANIEL [phone]) on 12/30/2016 11:25:01 PM       Radiology Dg Chest 2 View  Result Date: 12/30/2016 CLINICAL DATA:  83 y/o  F; fall. EXAM: CHEST  2 VIEW COMPARISON:  02/13/2012 chest radiograph. 12/27/2016 right shoulder radiographs. FINDINGS: Stable heart size and mediastinal contours are within normal limits. Aortic atherosclerosis with calcification. Both lungs are clear. Right surgical neck and greater tuberosity fracture of proximal right humerus as seen on prior radiographs. IMPRESSION: No active cardiopulmonary disease. Right proximal humerus fracture as seen on prior shoulder radiographs. Aortic atherosclerosis. Electronically Signed   By: Mitzi HansenLance  Furusawa-Stratton M.D.   On: 12/30/2016 23:39   Ct Head Wo Contrast  Result Date: 12/30/2016 CLINICAL DATA:  Frequent falls, found on the floor EXAM: CT HEAD WITHOUT CONTRAST TECHNIQUE: Contiguous axial images were obtained from the base of the skull through the vertex without intravenous contrast. COMPARISON:  MRI 05/13/2015, CT brain 05/13/2015 FINDINGS: Brain: No evidence of acute infarction, hemorrhage, hydrocephalus, extra-axial collection or mass lesion/mass effect. Old cerebellar infarcts. Mild periventricular white matter small vessel change. Atrophy. Stable ventricular size. Vascular: No hyperdense vessels. Scattered calcifications at the carotid siphons. Skull: No fracture.  Mastoid air cells grossly clear. Sinuses/Orbits: Mucosal thickening in the ethmoid sinuses. No acute orbital abnormality. Bilateral lens extraction. Other: None IMPRESSION: No definite CT evidence for acute intracranial abnormality. Electronically Signed   By: Jasmine PangKim  Fujinaga M.D.   On: 12/30/2016 23:19    Procedures Procedures (including critical care time)  Medications Ordered in ED Medications  fosfomycin (MONUROL) packet 3 g (not administered)     Initial Impression / Assessment and Plan / ED Course  I have reviewed the triage vital signs and  the nursing notes.  Pertinent labs & imaging results that were available during my care of the patient were reviewed by me and considered in my medical decision making (see chart for details).     83 yo F with a fall.  Found on the ground, pain to the right shoulder, but recently fx this.  Patient electing not to have a image of the shoulder, will ct head, cxr, ua labs.   UA with nitrate positive, +LE, small bacteria.  Will treat with nursing home hx of change in mental state, given fosfomycin.   11:44 PM:  I have discussed the diagnosis/risks/treatment options with the patient and believe the pt to be eligible for discharge home to follow-up with PCP. We also discussed returning to the ED immediately if new or worsening sx occur. We discussed the sx which are most concerning (e.g., sudden worsening pain, fever, inability to tolerate by mouth) that necessitate immediate return. Medications administered to the patient during their visit and any new prescriptions provided to the patient are listed below.  Medications given during this visit Medications  fosfomycin (MONUROL) packet 3 g (not administered)     The patient appears reasonably screen and/or stabilized for discharge and I doubt any other medical condition or other [facility] requiring further screening, evaluation, or treatment in the ED at this time prior to discharge.    Final Clinical Impressions(s) / ED Diagnoses   Final diagnoses:  Acute cystitis without hematuria    New Prescriptions New Prescriptions   No medications on file     Melene Plan, DO 12/30/16 2344    Melene Plan, DO 12/30/16 2344

## 2016-12-30 NOTE — ED Triage Notes (Signed)
BIB EMS [facility]. Unwitnessed fall, found on floor slumped against door. Unknown LOC. Pt was recently DC from hospital for a fall as well and fx R clavicle. Per facility pt normally A/OX4 but has been confused ever since DC from hospital. Given 50 fentanyl. CBG 119.

## 2016-12-30 NOTE — Discharge Instructions (Signed)
You were given a one time dose of a medication for your UTI.  Follow up with your family doc and ortho.

## 2016-12-30 NOTE — ED Notes (Signed)
[facility] called  

## 2016-12-30 NOTE — ED Notes (Signed)
Upon assessment pt is A/OX4 for this RN, denies any confusion as facility states.

## 2016-12-31 DIAGNOSIS — S42009A Fracture of unspecified part of unspecified clavicle, initial encounter for closed fracture: Secondary | ICD-10-CM | POA: Diagnosis not present

## 2016-12-31 DIAGNOSIS — R259 Unspecified abnormal involuntary movements: Secondary | ICD-10-CM | POA: Diagnosis not present

## 2017-01-02 DIAGNOSIS — M25511 Pain in right shoulder: Secondary | ICD-10-CM | POA: Diagnosis not present

## 2017-01-08 DIAGNOSIS — I129 Hypertensive chronic kidney disease with stage 1 through stage 4 chronic kidney disease, or unspecified chronic kidney disease: Secondary | ICD-10-CM | POA: Diagnosis not present

## 2017-01-08 DIAGNOSIS — W19XXXD Unspecified fall, subsequent encounter: Secondary | ICD-10-CM | POA: Diagnosis not present

## 2017-01-08 DIAGNOSIS — N189 Chronic kidney disease, unspecified: Secondary | ICD-10-CM | POA: Diagnosis not present

## 2017-01-08 DIAGNOSIS — E119 Type 2 diabetes mellitus without complications: Secondary | ICD-10-CM | POA: Diagnosis not present

## 2017-01-08 DIAGNOSIS — M810 Age-related osteoporosis without current pathological fracture: Secondary | ICD-10-CM | POA: Diagnosis not present

## 2017-01-08 DIAGNOSIS — S42211D Unspecified displaced fracture of surgical neck of right humerus, subsequent encounter for fracture with routine healing: Secondary | ICD-10-CM | POA: Diagnosis not present

## 2017-01-09 DIAGNOSIS — W19XXXD Unspecified fall, subsequent encounter: Secondary | ICD-10-CM | POA: Diagnosis not present

## 2017-01-09 DIAGNOSIS — E119 Type 2 diabetes mellitus without complications: Secondary | ICD-10-CM | POA: Diagnosis not present

## 2017-01-09 DIAGNOSIS — S42211D Unspecified displaced fracture of surgical neck of right humerus, subsequent encounter for fracture with routine healing: Secondary | ICD-10-CM | POA: Diagnosis not present

## 2017-01-09 DIAGNOSIS — I129 Hypertensive chronic kidney disease with stage 1 through stage 4 chronic kidney disease, or unspecified chronic kidney disease: Secondary | ICD-10-CM | POA: Diagnosis not present

## 2017-01-09 DIAGNOSIS — N189 Chronic kidney disease, unspecified: Secondary | ICD-10-CM | POA: Diagnosis not present

## 2017-01-09 DIAGNOSIS — M810 Age-related osteoporosis without current pathological fracture: Secondary | ICD-10-CM | POA: Diagnosis not present

## 2017-01-13 DIAGNOSIS — S42211D Unspecified displaced fracture of surgical neck of right humerus, subsequent encounter for fracture with routine healing: Secondary | ICD-10-CM | POA: Diagnosis not present

## 2017-01-13 DIAGNOSIS — I129 Hypertensive chronic kidney disease with stage 1 through stage 4 chronic kidney disease, or unspecified chronic kidney disease: Secondary | ICD-10-CM | POA: Diagnosis not present

## 2017-01-13 DIAGNOSIS — E119 Type 2 diabetes mellitus without complications: Secondary | ICD-10-CM | POA: Diagnosis not present

## 2017-01-13 DIAGNOSIS — M810 Age-related osteoporosis without current pathological fracture: Secondary | ICD-10-CM | POA: Diagnosis not present

## 2017-01-13 DIAGNOSIS — N189 Chronic kidney disease, unspecified: Secondary | ICD-10-CM | POA: Diagnosis not present

## 2017-01-13 DIAGNOSIS — W19XXXD Unspecified fall, subsequent encounter: Secondary | ICD-10-CM | POA: Diagnosis not present

## 2017-01-17 DIAGNOSIS — M810 Age-related osteoporosis without current pathological fracture: Secondary | ICD-10-CM | POA: Diagnosis not present

## 2017-01-17 DIAGNOSIS — W19XXXD Unspecified fall, subsequent encounter: Secondary | ICD-10-CM | POA: Diagnosis not present

## 2017-01-17 DIAGNOSIS — I129 Hypertensive chronic kidney disease with stage 1 through stage 4 chronic kidney disease, or unspecified chronic kidney disease: Secondary | ICD-10-CM | POA: Diagnosis not present

## 2017-01-17 DIAGNOSIS — S42211D Unspecified displaced fracture of surgical neck of right humerus, subsequent encounter for fracture with routine healing: Secondary | ICD-10-CM | POA: Diagnosis not present

## 2017-01-17 DIAGNOSIS — E119 Type 2 diabetes mellitus without complications: Secondary | ICD-10-CM | POA: Diagnosis not present

## 2017-01-17 DIAGNOSIS — N189 Chronic kidney disease, unspecified: Secondary | ICD-10-CM | POA: Diagnosis not present

## 2017-01-19 DIAGNOSIS — E119 Type 2 diabetes mellitus without complications: Secondary | ICD-10-CM | POA: Diagnosis not present

## 2017-01-19 DIAGNOSIS — W19XXXD Unspecified fall, subsequent encounter: Secondary | ICD-10-CM | POA: Diagnosis not present

## 2017-01-19 DIAGNOSIS — I129 Hypertensive chronic kidney disease with stage 1 through stage 4 chronic kidney disease, or unspecified chronic kidney disease: Secondary | ICD-10-CM | POA: Diagnosis not present

## 2017-01-19 DIAGNOSIS — S42211D Unspecified displaced fracture of surgical neck of right humerus, subsequent encounter for fracture with routine healing: Secondary | ICD-10-CM | POA: Diagnosis not present

## 2017-01-19 DIAGNOSIS — N189 Chronic kidney disease, unspecified: Secondary | ICD-10-CM | POA: Diagnosis not present

## 2017-01-19 DIAGNOSIS — M810 Age-related osteoporosis without current pathological fracture: Secondary | ICD-10-CM | POA: Diagnosis not present

## 2017-01-20 DIAGNOSIS — E119 Type 2 diabetes mellitus without complications: Secondary | ICD-10-CM | POA: Diagnosis not present

## 2017-01-20 DIAGNOSIS — I129 Hypertensive chronic kidney disease with stage 1 through stage 4 chronic kidney disease, or unspecified chronic kidney disease: Secondary | ICD-10-CM | POA: Diagnosis not present

## 2017-01-20 DIAGNOSIS — M810 Age-related osteoporosis without current pathological fracture: Secondary | ICD-10-CM | POA: Diagnosis not present

## 2017-01-20 DIAGNOSIS — W19XXXD Unspecified fall, subsequent encounter: Secondary | ICD-10-CM | POA: Diagnosis not present

## 2017-01-20 DIAGNOSIS — N189 Chronic kidney disease, unspecified: Secondary | ICD-10-CM | POA: Diagnosis not present

## 2017-01-20 DIAGNOSIS — S42211D Unspecified displaced fracture of surgical neck of right humerus, subsequent encounter for fracture with routine healing: Secondary | ICD-10-CM | POA: Diagnosis not present

## 2017-01-23 DIAGNOSIS — I129 Hypertensive chronic kidney disease with stage 1 through stage 4 chronic kidney disease, or unspecified chronic kidney disease: Secondary | ICD-10-CM | POA: Diagnosis not present

## 2017-01-23 DIAGNOSIS — S42211D Unspecified displaced fracture of surgical neck of right humerus, subsequent encounter for fracture with routine healing: Secondary | ICD-10-CM | POA: Diagnosis not present

## 2017-01-23 DIAGNOSIS — N189 Chronic kidney disease, unspecified: Secondary | ICD-10-CM | POA: Diagnosis not present

## 2017-01-23 DIAGNOSIS — M810 Age-related osteoporosis without current pathological fracture: Secondary | ICD-10-CM | POA: Diagnosis not present

## 2017-01-23 DIAGNOSIS — W19XXXD Unspecified fall, subsequent encounter: Secondary | ICD-10-CM | POA: Diagnosis not present

## 2017-01-23 DIAGNOSIS — E119 Type 2 diabetes mellitus without complications: Secondary | ICD-10-CM | POA: Diagnosis not present

## 2017-01-25 DIAGNOSIS — N189 Chronic kidney disease, unspecified: Secondary | ICD-10-CM | POA: Diagnosis not present

## 2017-01-25 DIAGNOSIS — I129 Hypertensive chronic kidney disease with stage 1 through stage 4 chronic kidney disease, or unspecified chronic kidney disease: Secondary | ICD-10-CM | POA: Diagnosis not present

## 2017-01-25 DIAGNOSIS — E119 Type 2 diabetes mellitus without complications: Secondary | ICD-10-CM | POA: Diagnosis not present

## 2017-01-25 DIAGNOSIS — M810 Age-related osteoporosis without current pathological fracture: Secondary | ICD-10-CM | POA: Diagnosis not present

## 2017-01-25 DIAGNOSIS — S42211D Unspecified displaced fracture of surgical neck of right humerus, subsequent encounter for fracture with routine healing: Secondary | ICD-10-CM | POA: Diagnosis not present

## 2017-01-25 DIAGNOSIS — W19XXXD Unspecified fall, subsequent encounter: Secondary | ICD-10-CM | POA: Diagnosis not present

## 2017-01-25 DIAGNOSIS — S42291D Other displaced fracture of upper end of right humerus, subsequent encounter for fracture with routine healing: Secondary | ICD-10-CM | POA: Diagnosis not present

## 2017-01-26 DIAGNOSIS — M810 Age-related osteoporosis without current pathological fracture: Secondary | ICD-10-CM | POA: Diagnosis not present

## 2017-01-26 DIAGNOSIS — N189 Chronic kidney disease, unspecified: Secondary | ICD-10-CM | POA: Diagnosis not present

## 2017-01-26 DIAGNOSIS — I129 Hypertensive chronic kidney disease with stage 1 through stage 4 chronic kidney disease, or unspecified chronic kidney disease: Secondary | ICD-10-CM | POA: Diagnosis not present

## 2017-01-26 DIAGNOSIS — S42211D Unspecified displaced fracture of surgical neck of right humerus, subsequent encounter for fracture with routine healing: Secondary | ICD-10-CM | POA: Diagnosis not present

## 2017-01-26 DIAGNOSIS — E119 Type 2 diabetes mellitus without complications: Secondary | ICD-10-CM | POA: Diagnosis not present

## 2017-01-26 DIAGNOSIS — W19XXXD Unspecified fall, subsequent encounter: Secondary | ICD-10-CM | POA: Diagnosis not present

## 2017-01-27 DIAGNOSIS — S42211D Unspecified displaced fracture of surgical neck of right humerus, subsequent encounter for fracture with routine healing: Secondary | ICD-10-CM | POA: Diagnosis not present

## 2017-01-27 DIAGNOSIS — W19XXXD Unspecified fall, subsequent encounter: Secondary | ICD-10-CM | POA: Diagnosis not present

## 2017-01-27 DIAGNOSIS — E119 Type 2 diabetes mellitus without complications: Secondary | ICD-10-CM | POA: Diagnosis not present

## 2017-01-27 DIAGNOSIS — M810 Age-related osteoporosis without current pathological fracture: Secondary | ICD-10-CM | POA: Diagnosis not present

## 2017-01-27 DIAGNOSIS — N189 Chronic kidney disease, unspecified: Secondary | ICD-10-CM | POA: Diagnosis not present

## 2017-01-27 DIAGNOSIS — I129 Hypertensive chronic kidney disease with stage 1 through stage 4 chronic kidney disease, or unspecified chronic kidney disease: Secondary | ICD-10-CM | POA: Diagnosis not present

## 2017-02-02 DIAGNOSIS — I129 Hypertensive chronic kidney disease with stage 1 through stage 4 chronic kidney disease, or unspecified chronic kidney disease: Secondary | ICD-10-CM | POA: Diagnosis not present

## 2017-02-02 DIAGNOSIS — E119 Type 2 diabetes mellitus without complications: Secondary | ICD-10-CM | POA: Diagnosis not present

## 2017-02-02 DIAGNOSIS — M810 Age-related osteoporosis without current pathological fracture: Secondary | ICD-10-CM | POA: Diagnosis not present

## 2017-02-02 DIAGNOSIS — S42211D Unspecified displaced fracture of surgical neck of right humerus, subsequent encounter for fracture with routine healing: Secondary | ICD-10-CM | POA: Diagnosis not present

## 2017-02-02 DIAGNOSIS — N189 Chronic kidney disease, unspecified: Secondary | ICD-10-CM | POA: Diagnosis not present

## 2017-02-02 DIAGNOSIS — W19XXXD Unspecified fall, subsequent encounter: Secondary | ICD-10-CM | POA: Diagnosis not present

## 2017-02-03 DIAGNOSIS — W19XXXD Unspecified fall, subsequent encounter: Secondary | ICD-10-CM | POA: Diagnosis not present

## 2017-02-03 DIAGNOSIS — M810 Age-related osteoporosis without current pathological fracture: Secondary | ICD-10-CM | POA: Diagnosis not present

## 2017-02-03 DIAGNOSIS — I129 Hypertensive chronic kidney disease with stage 1 through stage 4 chronic kidney disease, or unspecified chronic kidney disease: Secondary | ICD-10-CM | POA: Diagnosis not present

## 2017-02-03 DIAGNOSIS — S42211D Unspecified displaced fracture of surgical neck of right humerus, subsequent encounter for fracture with routine healing: Secondary | ICD-10-CM | POA: Diagnosis not present

## 2017-02-03 DIAGNOSIS — E119 Type 2 diabetes mellitus without complications: Secondary | ICD-10-CM | POA: Diagnosis not present

## 2017-02-03 DIAGNOSIS — N189 Chronic kidney disease, unspecified: Secondary | ICD-10-CM | POA: Diagnosis not present

## 2017-02-07 ENCOUNTER — Ambulatory Visit: Payer: Self-pay | Admitting: Neurology

## 2017-02-07 DIAGNOSIS — N189 Chronic kidney disease, unspecified: Secondary | ICD-10-CM | POA: Diagnosis not present

## 2017-02-07 DIAGNOSIS — E119 Type 2 diabetes mellitus without complications: Secondary | ICD-10-CM | POA: Diagnosis not present

## 2017-02-07 DIAGNOSIS — W19XXXD Unspecified fall, subsequent encounter: Secondary | ICD-10-CM | POA: Diagnosis not present

## 2017-02-07 DIAGNOSIS — I129 Hypertensive chronic kidney disease with stage 1 through stage 4 chronic kidney disease, or unspecified chronic kidney disease: Secondary | ICD-10-CM | POA: Diagnosis not present

## 2017-02-07 DIAGNOSIS — M810 Age-related osteoporosis without current pathological fracture: Secondary | ICD-10-CM | POA: Diagnosis not present

## 2017-02-07 DIAGNOSIS — S42211D Unspecified displaced fracture of surgical neck of right humerus, subsequent encounter for fracture with routine healing: Secondary | ICD-10-CM | POA: Diagnosis not present

## 2017-02-08 ENCOUNTER — Ambulatory Visit: Payer: Self-pay | Admitting: Neurology

## 2017-02-10 DIAGNOSIS — N189 Chronic kidney disease, unspecified: Secondary | ICD-10-CM | POA: Diagnosis not present

## 2017-02-10 DIAGNOSIS — M810 Age-related osteoporosis without current pathological fracture: Secondary | ICD-10-CM | POA: Diagnosis not present

## 2017-02-10 DIAGNOSIS — E119 Type 2 diabetes mellitus without complications: Secondary | ICD-10-CM | POA: Diagnosis not present

## 2017-02-10 DIAGNOSIS — I129 Hypertensive chronic kidney disease with stage 1 through stage 4 chronic kidney disease, or unspecified chronic kidney disease: Secondary | ICD-10-CM | POA: Diagnosis not present

## 2017-02-10 DIAGNOSIS — W19XXXD Unspecified fall, subsequent encounter: Secondary | ICD-10-CM | POA: Diagnosis not present

## 2017-02-10 DIAGNOSIS — S42211D Unspecified displaced fracture of surgical neck of right humerus, subsequent encounter for fracture with routine healing: Secondary | ICD-10-CM | POA: Diagnosis not present

## 2017-02-13 ENCOUNTER — Encounter: Payer: Self-pay | Admitting: Neurology

## 2017-02-13 DIAGNOSIS — Z961 Presence of intraocular lens: Secondary | ICD-10-CM | POA: Diagnosis not present

## 2017-02-13 DIAGNOSIS — H524 Presbyopia: Secondary | ICD-10-CM | POA: Diagnosis not present

## 2017-02-13 DIAGNOSIS — M810 Age-related osteoporosis without current pathological fracture: Secondary | ICD-10-CM | POA: Diagnosis not present

## 2017-02-13 DIAGNOSIS — S42211D Unspecified displaced fracture of surgical neck of right humerus, subsequent encounter for fracture with routine healing: Secondary | ICD-10-CM | POA: Diagnosis not present

## 2017-02-13 DIAGNOSIS — N189 Chronic kidney disease, unspecified: Secondary | ICD-10-CM | POA: Diagnosis not present

## 2017-02-13 DIAGNOSIS — E119 Type 2 diabetes mellitus without complications: Secondary | ICD-10-CM | POA: Diagnosis not present

## 2017-02-13 DIAGNOSIS — I129 Hypertensive chronic kidney disease with stage 1 through stage 4 chronic kidney disease, or unspecified chronic kidney disease: Secondary | ICD-10-CM | POA: Diagnosis not present

## 2017-02-13 DIAGNOSIS — W19XXXD Unspecified fall, subsequent encounter: Secondary | ICD-10-CM | POA: Diagnosis not present

## 2017-02-14 DIAGNOSIS — S42291D Other displaced fracture of upper end of right humerus, subsequent encounter for fracture with routine healing: Secondary | ICD-10-CM | POA: Diagnosis not present

## 2017-02-17 DIAGNOSIS — N189 Chronic kidney disease, unspecified: Secondary | ICD-10-CM | POA: Diagnosis not present

## 2017-02-17 DIAGNOSIS — E119 Type 2 diabetes mellitus without complications: Secondary | ICD-10-CM | POA: Diagnosis not present

## 2017-02-17 DIAGNOSIS — W19XXXD Unspecified fall, subsequent encounter: Secondary | ICD-10-CM | POA: Diagnosis not present

## 2017-02-17 DIAGNOSIS — I129 Hypertensive chronic kidney disease with stage 1 through stage 4 chronic kidney disease, or unspecified chronic kidney disease: Secondary | ICD-10-CM | POA: Diagnosis not present

## 2017-02-17 DIAGNOSIS — S42211D Unspecified displaced fracture of surgical neck of right humerus, subsequent encounter for fracture with routine healing: Secondary | ICD-10-CM | POA: Diagnosis not present

## 2017-02-17 DIAGNOSIS — M810 Age-related osteoporosis without current pathological fracture: Secondary | ICD-10-CM | POA: Diagnosis not present

## 2017-02-22 DIAGNOSIS — W19XXXD Unspecified fall, subsequent encounter: Secondary | ICD-10-CM | POA: Diagnosis not present

## 2017-02-22 DIAGNOSIS — N189 Chronic kidney disease, unspecified: Secondary | ICD-10-CM | POA: Diagnosis not present

## 2017-02-22 DIAGNOSIS — I129 Hypertensive chronic kidney disease with stage 1 through stage 4 chronic kidney disease, or unspecified chronic kidney disease: Secondary | ICD-10-CM | POA: Diagnosis not present

## 2017-02-22 DIAGNOSIS — M810 Age-related osteoporosis without current pathological fracture: Secondary | ICD-10-CM | POA: Diagnosis not present

## 2017-02-22 DIAGNOSIS — S42211D Unspecified displaced fracture of surgical neck of right humerus, subsequent encounter for fracture with routine healing: Secondary | ICD-10-CM | POA: Diagnosis not present

## 2017-02-22 DIAGNOSIS — E119 Type 2 diabetes mellitus without complications: Secondary | ICD-10-CM | POA: Diagnosis not present

## 2017-02-24 DIAGNOSIS — N189 Chronic kidney disease, unspecified: Secondary | ICD-10-CM | POA: Diagnosis not present

## 2017-02-24 DIAGNOSIS — M810 Age-related osteoporosis without current pathological fracture: Secondary | ICD-10-CM | POA: Diagnosis not present

## 2017-02-24 DIAGNOSIS — S42211D Unspecified displaced fracture of surgical neck of right humerus, subsequent encounter for fracture with routine healing: Secondary | ICD-10-CM | POA: Diagnosis not present

## 2017-02-24 DIAGNOSIS — I129 Hypertensive chronic kidney disease with stage 1 through stage 4 chronic kidney disease, or unspecified chronic kidney disease: Secondary | ICD-10-CM | POA: Diagnosis not present

## 2017-02-24 DIAGNOSIS — E119 Type 2 diabetes mellitus without complications: Secondary | ICD-10-CM | POA: Diagnosis not present

## 2017-02-24 DIAGNOSIS — W19XXXD Unspecified fall, subsequent encounter: Secondary | ICD-10-CM | POA: Diagnosis not present

## 2017-02-28 DIAGNOSIS — M810 Age-related osteoporosis without current pathological fracture: Secondary | ICD-10-CM | POA: Diagnosis not present

## 2017-02-28 DIAGNOSIS — E119 Type 2 diabetes mellitus without complications: Secondary | ICD-10-CM | POA: Diagnosis not present

## 2017-02-28 DIAGNOSIS — I129 Hypertensive chronic kidney disease with stage 1 through stage 4 chronic kidney disease, or unspecified chronic kidney disease: Secondary | ICD-10-CM | POA: Diagnosis not present

## 2017-02-28 DIAGNOSIS — N189 Chronic kidney disease, unspecified: Secondary | ICD-10-CM | POA: Diagnosis not present

## 2017-02-28 DIAGNOSIS — S42211D Unspecified displaced fracture of surgical neck of right humerus, subsequent encounter for fracture with routine healing: Secondary | ICD-10-CM | POA: Diagnosis not present

## 2017-02-28 DIAGNOSIS — W19XXXD Unspecified fall, subsequent encounter: Secondary | ICD-10-CM | POA: Diagnosis not present

## 2017-03-01 DIAGNOSIS — W19XXXD Unspecified fall, subsequent encounter: Secondary | ICD-10-CM | POA: Diagnosis not present

## 2017-03-01 DIAGNOSIS — M810 Age-related osteoporosis without current pathological fracture: Secondary | ICD-10-CM | POA: Diagnosis not present

## 2017-03-01 DIAGNOSIS — E119 Type 2 diabetes mellitus without complications: Secondary | ICD-10-CM | POA: Diagnosis not present

## 2017-03-01 DIAGNOSIS — S42211D Unspecified displaced fracture of surgical neck of right humerus, subsequent encounter for fracture with routine healing: Secondary | ICD-10-CM | POA: Diagnosis not present

## 2017-03-01 DIAGNOSIS — N189 Chronic kidney disease, unspecified: Secondary | ICD-10-CM | POA: Diagnosis not present

## 2017-03-01 DIAGNOSIS — I129 Hypertensive chronic kidney disease with stage 1 through stage 4 chronic kidney disease, or unspecified chronic kidney disease: Secondary | ICD-10-CM | POA: Diagnosis not present

## 2017-03-02 DIAGNOSIS — E119 Type 2 diabetes mellitus without complications: Secondary | ICD-10-CM | POA: Diagnosis not present

## 2017-03-02 DIAGNOSIS — M810 Age-related osteoporosis without current pathological fracture: Secondary | ICD-10-CM | POA: Diagnosis not present

## 2017-03-02 DIAGNOSIS — S42211D Unspecified displaced fracture of surgical neck of right humerus, subsequent encounter for fracture with routine healing: Secondary | ICD-10-CM | POA: Diagnosis not present

## 2017-03-02 DIAGNOSIS — I129 Hypertensive chronic kidney disease with stage 1 through stage 4 chronic kidney disease, or unspecified chronic kidney disease: Secondary | ICD-10-CM | POA: Diagnosis not present

## 2017-03-02 DIAGNOSIS — W19XXXD Unspecified fall, subsequent encounter: Secondary | ICD-10-CM | POA: Diagnosis not present

## 2017-03-02 DIAGNOSIS — N189 Chronic kidney disease, unspecified: Secondary | ICD-10-CM | POA: Diagnosis not present

## 2017-03-03 DIAGNOSIS — I129 Hypertensive chronic kidney disease with stage 1 through stage 4 chronic kidney disease, or unspecified chronic kidney disease: Secondary | ICD-10-CM | POA: Diagnosis not present

## 2017-03-03 DIAGNOSIS — E119 Type 2 diabetes mellitus without complications: Secondary | ICD-10-CM | POA: Diagnosis not present

## 2017-03-03 DIAGNOSIS — S42211D Unspecified displaced fracture of surgical neck of right humerus, subsequent encounter for fracture with routine healing: Secondary | ICD-10-CM | POA: Diagnosis not present

## 2017-03-03 DIAGNOSIS — M810 Age-related osteoporosis without current pathological fracture: Secondary | ICD-10-CM | POA: Diagnosis not present

## 2017-03-03 DIAGNOSIS — W19XXXD Unspecified fall, subsequent encounter: Secondary | ICD-10-CM | POA: Diagnosis not present

## 2017-03-03 DIAGNOSIS — N189 Chronic kidney disease, unspecified: Secondary | ICD-10-CM | POA: Diagnosis not present

## 2017-03-06 DIAGNOSIS — N189 Chronic kidney disease, unspecified: Secondary | ICD-10-CM | POA: Diagnosis not present

## 2017-03-06 DIAGNOSIS — W19XXXD Unspecified fall, subsequent encounter: Secondary | ICD-10-CM | POA: Diagnosis not present

## 2017-03-06 DIAGNOSIS — E119 Type 2 diabetes mellitus without complications: Secondary | ICD-10-CM | POA: Diagnosis not present

## 2017-03-06 DIAGNOSIS — I129 Hypertensive chronic kidney disease with stage 1 through stage 4 chronic kidney disease, or unspecified chronic kidney disease: Secondary | ICD-10-CM | POA: Diagnosis not present

## 2017-03-06 DIAGNOSIS — S42211D Unspecified displaced fracture of surgical neck of right humerus, subsequent encounter for fracture with routine healing: Secondary | ICD-10-CM | POA: Diagnosis not present

## 2017-03-06 DIAGNOSIS — M810 Age-related osteoporosis without current pathological fracture: Secondary | ICD-10-CM | POA: Diagnosis not present

## 2017-03-07 DIAGNOSIS — E119 Type 2 diabetes mellitus without complications: Secondary | ICD-10-CM | POA: Diagnosis not present

## 2017-03-07 DIAGNOSIS — W19XXXD Unspecified fall, subsequent encounter: Secondary | ICD-10-CM | POA: Diagnosis not present

## 2017-03-07 DIAGNOSIS — I129 Hypertensive chronic kidney disease with stage 1 through stage 4 chronic kidney disease, or unspecified chronic kidney disease: Secondary | ICD-10-CM | POA: Diagnosis not present

## 2017-03-07 DIAGNOSIS — M810 Age-related osteoporosis without current pathological fracture: Secondary | ICD-10-CM | POA: Diagnosis not present

## 2017-03-07 DIAGNOSIS — S42211D Unspecified displaced fracture of surgical neck of right humerus, subsequent encounter for fracture with routine healing: Secondary | ICD-10-CM | POA: Diagnosis not present

## 2017-03-07 DIAGNOSIS — N189 Chronic kidney disease, unspecified: Secondary | ICD-10-CM | POA: Diagnosis not present

## 2017-03-08 DIAGNOSIS — N189 Chronic kidney disease, unspecified: Secondary | ICD-10-CM | POA: Diagnosis not present

## 2017-03-08 DIAGNOSIS — S42211D Unspecified displaced fracture of surgical neck of right humerus, subsequent encounter for fracture with routine healing: Secondary | ICD-10-CM | POA: Diagnosis not present

## 2017-03-08 DIAGNOSIS — M810 Age-related osteoporosis without current pathological fracture: Secondary | ICD-10-CM | POA: Diagnosis not present

## 2017-03-08 DIAGNOSIS — W19XXXD Unspecified fall, subsequent encounter: Secondary | ICD-10-CM | POA: Diagnosis not present

## 2017-03-08 DIAGNOSIS — E119 Type 2 diabetes mellitus without complications: Secondary | ICD-10-CM | POA: Diagnosis not present

## 2017-03-08 DIAGNOSIS — I129 Hypertensive chronic kidney disease with stage 1 through stage 4 chronic kidney disease, or unspecified chronic kidney disease: Secondary | ICD-10-CM | POA: Diagnosis not present

## 2017-03-20 DIAGNOSIS — S42291D Other displaced fracture of upper end of right humerus, subsequent encounter for fracture with routine healing: Secondary | ICD-10-CM | POA: Diagnosis not present

## 2017-03-23 DIAGNOSIS — W19XXXD Unspecified fall, subsequent encounter: Secondary | ICD-10-CM | POA: Diagnosis not present

## 2017-03-23 DIAGNOSIS — M059 Rheumatoid arthritis with rheumatoid factor, unspecified: Secondary | ICD-10-CM | POA: Diagnosis not present

## 2017-03-23 DIAGNOSIS — I129 Hypertensive chronic kidney disease with stage 1 through stage 4 chronic kidney disease, or unspecified chronic kidney disease: Secondary | ICD-10-CM | POA: Diagnosis not present

## 2017-03-23 DIAGNOSIS — E1122 Type 2 diabetes mellitus with diabetic chronic kidney disease: Secondary | ICD-10-CM | POA: Diagnosis not present

## 2017-03-23 DIAGNOSIS — N183 Chronic kidney disease, stage 3 (moderate): Secondary | ICD-10-CM | POA: Diagnosis not present

## 2017-03-23 DIAGNOSIS — M80021D Age-related osteoporosis with current pathological fracture, right humerus, subsequent encounter for fracture with routine healing: Secondary | ICD-10-CM | POA: Diagnosis not present

## 2017-03-23 DIAGNOSIS — F329 Major depressive disorder, single episode, unspecified: Secondary | ICD-10-CM | POA: Diagnosis not present

## 2017-03-25 DIAGNOSIS — I129 Hypertensive chronic kidney disease with stage 1 through stage 4 chronic kidney disease, or unspecified chronic kidney disease: Secondary | ICD-10-CM | POA: Diagnosis not present

## 2017-03-25 DIAGNOSIS — M80021D Age-related osteoporosis with current pathological fracture, right humerus, subsequent encounter for fracture with routine healing: Secondary | ICD-10-CM | POA: Diagnosis not present

## 2017-03-27 DIAGNOSIS — M80021D Age-related osteoporosis with current pathological fracture, right humerus, subsequent encounter for fracture with routine healing: Secondary | ICD-10-CM | POA: Diagnosis not present

## 2017-03-27 DIAGNOSIS — I129 Hypertensive chronic kidney disease with stage 1 through stage 4 chronic kidney disease, or unspecified chronic kidney disease: Secondary | ICD-10-CM | POA: Diagnosis not present

## 2017-03-30 DIAGNOSIS — M80021D Age-related osteoporosis with current pathological fracture, right humerus, subsequent encounter for fracture with routine healing: Secondary | ICD-10-CM | POA: Diagnosis not present

## 2017-03-30 DIAGNOSIS — I129 Hypertensive chronic kidney disease with stage 1 through stage 4 chronic kidney disease, or unspecified chronic kidney disease: Secondary | ICD-10-CM | POA: Diagnosis not present

## 2017-03-31 DIAGNOSIS — M80021D Age-related osteoporosis with current pathological fracture, right humerus, subsequent encounter for fracture with routine healing: Secondary | ICD-10-CM | POA: Diagnosis not present

## 2017-03-31 DIAGNOSIS — I129 Hypertensive chronic kidney disease with stage 1 through stage 4 chronic kidney disease, or unspecified chronic kidney disease: Secondary | ICD-10-CM | POA: Diagnosis not present

## 2017-04-03 DIAGNOSIS — I129 Hypertensive chronic kidney disease with stage 1 through stage 4 chronic kidney disease, or unspecified chronic kidney disease: Secondary | ICD-10-CM | POA: Diagnosis not present

## 2017-04-03 DIAGNOSIS — M80021D Age-related osteoporosis with current pathological fracture, right humerus, subsequent encounter for fracture with routine healing: Secondary | ICD-10-CM | POA: Diagnosis not present

## 2017-04-04 DIAGNOSIS — M80021D Age-related osteoporosis with current pathological fracture, right humerus, subsequent encounter for fracture with routine healing: Secondary | ICD-10-CM | POA: Diagnosis not present

## 2017-04-04 DIAGNOSIS — I129 Hypertensive chronic kidney disease with stage 1 through stage 4 chronic kidney disease, or unspecified chronic kidney disease: Secondary | ICD-10-CM | POA: Diagnosis not present

## 2017-04-06 DIAGNOSIS — I129 Hypertensive chronic kidney disease with stage 1 through stage 4 chronic kidney disease, or unspecified chronic kidney disease: Secondary | ICD-10-CM | POA: Diagnosis not present

## 2017-04-06 DIAGNOSIS — M80021D Age-related osteoporosis with current pathological fracture, right humerus, subsequent encounter for fracture with routine healing: Secondary | ICD-10-CM | POA: Diagnosis not present

## 2017-04-11 DIAGNOSIS — M80021D Age-related osteoporosis with current pathological fracture, right humerus, subsequent encounter for fracture with routine healing: Secondary | ICD-10-CM | POA: Diagnosis not present

## 2017-04-11 DIAGNOSIS — I129 Hypertensive chronic kidney disease with stage 1 through stage 4 chronic kidney disease, or unspecified chronic kidney disease: Secondary | ICD-10-CM | POA: Diagnosis not present

## 2017-04-13 DIAGNOSIS — I129 Hypertensive chronic kidney disease with stage 1 through stage 4 chronic kidney disease, or unspecified chronic kidney disease: Secondary | ICD-10-CM | POA: Diagnosis not present

## 2017-04-13 DIAGNOSIS — M80021D Age-related osteoporosis with current pathological fracture, right humerus, subsequent encounter for fracture with routine healing: Secondary | ICD-10-CM | POA: Diagnosis not present

## 2017-04-18 DIAGNOSIS — I129 Hypertensive chronic kidney disease with stage 1 through stage 4 chronic kidney disease, or unspecified chronic kidney disease: Secondary | ICD-10-CM | POA: Diagnosis not present

## 2017-04-18 DIAGNOSIS — M80021D Age-related osteoporosis with current pathological fracture, right humerus, subsequent encounter for fracture with routine healing: Secondary | ICD-10-CM | POA: Diagnosis not present

## 2017-04-20 DIAGNOSIS — I129 Hypertensive chronic kidney disease with stage 1 through stage 4 chronic kidney disease, or unspecified chronic kidney disease: Secondary | ICD-10-CM | POA: Diagnosis not present

## 2017-04-20 DIAGNOSIS — M80021D Age-related osteoporosis with current pathological fracture, right humerus, subsequent encounter for fracture with routine healing: Secondary | ICD-10-CM | POA: Diagnosis not present

## 2017-04-27 DIAGNOSIS — M80021D Age-related osteoporosis with current pathological fracture, right humerus, subsequent encounter for fracture with routine healing: Secondary | ICD-10-CM | POA: Diagnosis not present

## 2017-04-27 DIAGNOSIS — I129 Hypertensive chronic kidney disease with stage 1 through stage 4 chronic kidney disease, or unspecified chronic kidney disease: Secondary | ICD-10-CM | POA: Diagnosis not present

## 2017-05-01 DIAGNOSIS — S42291D Other displaced fracture of upper end of right humerus, subsequent encounter for fracture with routine healing: Secondary | ICD-10-CM | POA: Diagnosis not present

## 2017-05-02 DIAGNOSIS — M80021D Age-related osteoporosis with current pathological fracture, right humerus, subsequent encounter for fracture with routine healing: Secondary | ICD-10-CM | POA: Diagnosis not present

## 2017-05-02 DIAGNOSIS — I129 Hypertensive chronic kidney disease with stage 1 through stage 4 chronic kidney disease, or unspecified chronic kidney disease: Secondary | ICD-10-CM | POA: Diagnosis not present

## 2017-06-21 DIAGNOSIS — E1122 Type 2 diabetes mellitus with diabetic chronic kidney disease: Secondary | ICD-10-CM | POA: Diagnosis not present

## 2017-06-21 DIAGNOSIS — G309 Alzheimer's disease, unspecified: Secondary | ICD-10-CM | POA: Diagnosis not present

## 2017-06-21 DIAGNOSIS — F0280 Dementia in other diseases classified elsewhere without behavioral disturbance: Secondary | ICD-10-CM | POA: Diagnosis not present

## 2017-06-21 DIAGNOSIS — M1712 Unilateral primary osteoarthritis, left knee: Secondary | ICD-10-CM | POA: Diagnosis not present

## 2017-06-21 DIAGNOSIS — N183 Chronic kidney disease, stage 3 (moderate): Secondary | ICD-10-CM | POA: Diagnosis not present

## 2017-06-21 DIAGNOSIS — M353 Polymyalgia rheumatica: Secondary | ICD-10-CM | POA: Diagnosis not present

## 2017-06-22 DIAGNOSIS — E7800 Pure hypercholesterolemia, unspecified: Secondary | ICD-10-CM | POA: Diagnosis not present

## 2017-06-22 DIAGNOSIS — G309 Alzheimer's disease, unspecified: Secondary | ICD-10-CM | POA: Diagnosis not present

## 2017-06-22 DIAGNOSIS — M353 Polymyalgia rheumatica: Secondary | ICD-10-CM | POA: Diagnosis not present

## 2017-06-22 DIAGNOSIS — N183 Chronic kidney disease, stage 3 (moderate): Secondary | ICD-10-CM | POA: Diagnosis not present

## 2017-06-22 DIAGNOSIS — N3281 Overactive bladder: Secondary | ICD-10-CM | POA: Diagnosis not present

## 2017-06-22 DIAGNOSIS — Z0000 Encounter for general adult medical examination without abnormal findings: Secondary | ICD-10-CM | POA: Diagnosis not present

## 2017-06-22 DIAGNOSIS — M810 Age-related osteoporosis without current pathological fracture: Secondary | ICD-10-CM | POA: Diagnosis not present

## 2017-06-22 DIAGNOSIS — I129 Hypertensive chronic kidney disease with stage 1 through stage 4 chronic kidney disease, or unspecified chronic kidney disease: Secondary | ICD-10-CM | POA: Diagnosis not present

## 2017-06-22 DIAGNOSIS — F322 Major depressive disorder, single episode, severe without psychotic features: Secondary | ICD-10-CM | POA: Diagnosis not present

## 2017-06-22 DIAGNOSIS — E1122 Type 2 diabetes mellitus with diabetic chronic kidney disease: Secondary | ICD-10-CM | POA: Diagnosis not present

## 2017-06-22 DIAGNOSIS — J301 Allergic rhinitis due to pollen: Secondary | ICD-10-CM | POA: Diagnosis not present

## 2017-06-23 DIAGNOSIS — M1712 Unilateral primary osteoarthritis, left knee: Secondary | ICD-10-CM | POA: Diagnosis not present

## 2017-06-23 DIAGNOSIS — F0280 Dementia in other diseases classified elsewhere without behavioral disturbance: Secondary | ICD-10-CM | POA: Diagnosis not present

## 2017-06-23 DIAGNOSIS — E1122 Type 2 diabetes mellitus with diabetic chronic kidney disease: Secondary | ICD-10-CM | POA: Diagnosis not present

## 2017-06-23 DIAGNOSIS — G309 Alzheimer's disease, unspecified: Secondary | ICD-10-CM | POA: Diagnosis not present

## 2017-06-23 DIAGNOSIS — M353 Polymyalgia rheumatica: Secondary | ICD-10-CM | POA: Diagnosis not present

## 2017-06-23 DIAGNOSIS — N183 Chronic kidney disease, stage 3 (moderate): Secondary | ICD-10-CM | POA: Diagnosis not present

## 2017-06-26 DIAGNOSIS — G309 Alzheimer's disease, unspecified: Secondary | ICD-10-CM | POA: Diagnosis not present

## 2017-06-26 DIAGNOSIS — F0280 Dementia in other diseases classified elsewhere without behavioral disturbance: Secondary | ICD-10-CM | POA: Diagnosis not present

## 2017-06-26 DIAGNOSIS — M1712 Unilateral primary osteoarthritis, left knee: Secondary | ICD-10-CM | POA: Diagnosis not present

## 2017-06-26 DIAGNOSIS — M353 Polymyalgia rheumatica: Secondary | ICD-10-CM | POA: Diagnosis not present

## 2017-06-26 DIAGNOSIS — E1122 Type 2 diabetes mellitus with diabetic chronic kidney disease: Secondary | ICD-10-CM | POA: Diagnosis not present

## 2017-06-26 DIAGNOSIS — N183 Chronic kidney disease, stage 3 (moderate): Secondary | ICD-10-CM | POA: Diagnosis not present

## 2017-06-28 DIAGNOSIS — M1712 Unilateral primary osteoarthritis, left knee: Secondary | ICD-10-CM | POA: Diagnosis not present

## 2017-06-28 DIAGNOSIS — N183 Chronic kidney disease, stage 3 (moderate): Secondary | ICD-10-CM | POA: Diagnosis not present

## 2017-06-28 DIAGNOSIS — M353 Polymyalgia rheumatica: Secondary | ICD-10-CM | POA: Diagnosis not present

## 2017-06-28 DIAGNOSIS — G309 Alzheimer's disease, unspecified: Secondary | ICD-10-CM | POA: Diagnosis not present

## 2017-06-28 DIAGNOSIS — E1122 Type 2 diabetes mellitus with diabetic chronic kidney disease: Secondary | ICD-10-CM | POA: Diagnosis not present

## 2017-06-28 DIAGNOSIS — F0280 Dementia in other diseases classified elsewhere without behavioral disturbance: Secondary | ICD-10-CM | POA: Diagnosis not present

## 2017-06-29 DIAGNOSIS — G309 Alzheimer's disease, unspecified: Secondary | ICD-10-CM | POA: Diagnosis not present

## 2017-06-29 DIAGNOSIS — M353 Polymyalgia rheumatica: Secondary | ICD-10-CM | POA: Diagnosis not present

## 2017-06-29 DIAGNOSIS — M1712 Unilateral primary osteoarthritis, left knee: Secondary | ICD-10-CM | POA: Diagnosis not present

## 2017-06-29 DIAGNOSIS — N183 Chronic kidney disease, stage 3 (moderate): Secondary | ICD-10-CM | POA: Diagnosis not present

## 2017-06-29 DIAGNOSIS — F0280 Dementia in other diseases classified elsewhere without behavioral disturbance: Secondary | ICD-10-CM | POA: Diagnosis not present

## 2017-06-29 DIAGNOSIS — E1122 Type 2 diabetes mellitus with diabetic chronic kidney disease: Secondary | ICD-10-CM | POA: Diagnosis not present

## 2017-07-03 DIAGNOSIS — M353 Polymyalgia rheumatica: Secondary | ICD-10-CM | POA: Diagnosis not present

## 2017-07-03 DIAGNOSIS — G309 Alzheimer's disease, unspecified: Secondary | ICD-10-CM | POA: Diagnosis not present

## 2017-07-03 DIAGNOSIS — E1122 Type 2 diabetes mellitus with diabetic chronic kidney disease: Secondary | ICD-10-CM | POA: Diagnosis not present

## 2017-07-03 DIAGNOSIS — N183 Chronic kidney disease, stage 3 (moderate): Secondary | ICD-10-CM | POA: Diagnosis not present

## 2017-07-03 DIAGNOSIS — F0280 Dementia in other diseases classified elsewhere without behavioral disturbance: Secondary | ICD-10-CM | POA: Diagnosis not present

## 2017-07-03 DIAGNOSIS — M1712 Unilateral primary osteoarthritis, left knee: Secondary | ICD-10-CM | POA: Diagnosis not present

## 2017-07-04 DIAGNOSIS — N183 Chronic kidney disease, stage 3 (moderate): Secondary | ICD-10-CM | POA: Diagnosis not present

## 2017-07-04 DIAGNOSIS — G309 Alzheimer's disease, unspecified: Secondary | ICD-10-CM | POA: Diagnosis not present

## 2017-07-04 DIAGNOSIS — M353 Polymyalgia rheumatica: Secondary | ICD-10-CM | POA: Diagnosis not present

## 2017-07-04 DIAGNOSIS — M1712 Unilateral primary osteoarthritis, left knee: Secondary | ICD-10-CM | POA: Diagnosis not present

## 2017-07-04 DIAGNOSIS — F0280 Dementia in other diseases classified elsewhere without behavioral disturbance: Secondary | ICD-10-CM | POA: Diagnosis not present

## 2017-07-04 DIAGNOSIS — E1122 Type 2 diabetes mellitus with diabetic chronic kidney disease: Secondary | ICD-10-CM | POA: Diagnosis not present

## 2017-07-05 DIAGNOSIS — F0280 Dementia in other diseases classified elsewhere without behavioral disturbance: Secondary | ICD-10-CM | POA: Diagnosis not present

## 2017-07-05 DIAGNOSIS — E1122 Type 2 diabetes mellitus with diabetic chronic kidney disease: Secondary | ICD-10-CM | POA: Diagnosis not present

## 2017-07-05 DIAGNOSIS — M353 Polymyalgia rheumatica: Secondary | ICD-10-CM | POA: Diagnosis not present

## 2017-07-05 DIAGNOSIS — G309 Alzheimer's disease, unspecified: Secondary | ICD-10-CM | POA: Diagnosis not present

## 2017-07-05 DIAGNOSIS — M1712 Unilateral primary osteoarthritis, left knee: Secondary | ICD-10-CM | POA: Diagnosis not present

## 2017-07-05 DIAGNOSIS — N183 Chronic kidney disease, stage 3 (moderate): Secondary | ICD-10-CM | POA: Diagnosis not present

## 2017-07-06 DIAGNOSIS — E1122 Type 2 diabetes mellitus with diabetic chronic kidney disease: Secondary | ICD-10-CM | POA: Diagnosis not present

## 2017-07-06 DIAGNOSIS — G309 Alzheimer's disease, unspecified: Secondary | ICD-10-CM | POA: Diagnosis not present

## 2017-07-06 DIAGNOSIS — M353 Polymyalgia rheumatica: Secondary | ICD-10-CM | POA: Diagnosis not present

## 2017-07-06 DIAGNOSIS — M1712 Unilateral primary osteoarthritis, left knee: Secondary | ICD-10-CM | POA: Diagnosis not present

## 2017-07-06 DIAGNOSIS — F0280 Dementia in other diseases classified elsewhere without behavioral disturbance: Secondary | ICD-10-CM | POA: Diagnosis not present

## 2017-07-06 DIAGNOSIS — N183 Chronic kidney disease, stage 3 (moderate): Secondary | ICD-10-CM | POA: Diagnosis not present

## 2017-07-10 DIAGNOSIS — N183 Chronic kidney disease, stage 3 (moderate): Secondary | ICD-10-CM | POA: Diagnosis not present

## 2017-07-10 DIAGNOSIS — G309 Alzheimer's disease, unspecified: Secondary | ICD-10-CM | POA: Diagnosis not present

## 2017-07-10 DIAGNOSIS — F0280 Dementia in other diseases classified elsewhere without behavioral disturbance: Secondary | ICD-10-CM | POA: Diagnosis not present

## 2017-07-10 DIAGNOSIS — E1122 Type 2 diabetes mellitus with diabetic chronic kidney disease: Secondary | ICD-10-CM | POA: Diagnosis not present

## 2017-07-10 DIAGNOSIS — M353 Polymyalgia rheumatica: Secondary | ICD-10-CM | POA: Diagnosis not present

## 2017-07-10 DIAGNOSIS — M1712 Unilateral primary osteoarthritis, left knee: Secondary | ICD-10-CM | POA: Diagnosis not present

## 2017-07-11 DIAGNOSIS — F0280 Dementia in other diseases classified elsewhere without behavioral disturbance: Secondary | ICD-10-CM | POA: Diagnosis not present

## 2017-07-11 DIAGNOSIS — M1712 Unilateral primary osteoarthritis, left knee: Secondary | ICD-10-CM | POA: Diagnosis not present

## 2017-07-11 DIAGNOSIS — E1122 Type 2 diabetes mellitus with diabetic chronic kidney disease: Secondary | ICD-10-CM | POA: Diagnosis not present

## 2017-07-11 DIAGNOSIS — G309 Alzheimer's disease, unspecified: Secondary | ICD-10-CM | POA: Diagnosis not present

## 2017-07-11 DIAGNOSIS — N183 Chronic kidney disease, stage 3 (moderate): Secondary | ICD-10-CM | POA: Diagnosis not present

## 2017-07-11 DIAGNOSIS — M353 Polymyalgia rheumatica: Secondary | ICD-10-CM | POA: Diagnosis not present

## 2017-07-13 DIAGNOSIS — M1712 Unilateral primary osteoarthritis, left knee: Secondary | ICD-10-CM | POA: Diagnosis not present

## 2017-07-13 DIAGNOSIS — F0280 Dementia in other diseases classified elsewhere without behavioral disturbance: Secondary | ICD-10-CM | POA: Diagnosis not present

## 2017-07-13 DIAGNOSIS — M353 Polymyalgia rheumatica: Secondary | ICD-10-CM | POA: Diagnosis not present

## 2017-07-13 DIAGNOSIS — G309 Alzheimer's disease, unspecified: Secondary | ICD-10-CM | POA: Diagnosis not present

## 2017-07-13 DIAGNOSIS — N183 Chronic kidney disease, stage 3 (moderate): Secondary | ICD-10-CM | POA: Diagnosis not present

## 2017-07-13 DIAGNOSIS — E1122 Type 2 diabetes mellitus with diabetic chronic kidney disease: Secondary | ICD-10-CM | POA: Diagnosis not present

## 2017-07-17 DIAGNOSIS — F0280 Dementia in other diseases classified elsewhere without behavioral disturbance: Secondary | ICD-10-CM | POA: Diagnosis not present

## 2017-07-17 DIAGNOSIS — M353 Polymyalgia rheumatica: Secondary | ICD-10-CM | POA: Diagnosis not present

## 2017-07-17 DIAGNOSIS — N183 Chronic kidney disease, stage 3 (moderate): Secondary | ICD-10-CM | POA: Diagnosis not present

## 2017-07-17 DIAGNOSIS — E1122 Type 2 diabetes mellitus with diabetic chronic kidney disease: Secondary | ICD-10-CM | POA: Diagnosis not present

## 2017-07-17 DIAGNOSIS — M1712 Unilateral primary osteoarthritis, left knee: Secondary | ICD-10-CM | POA: Diagnosis not present

## 2017-07-17 DIAGNOSIS — G309 Alzheimer's disease, unspecified: Secondary | ICD-10-CM | POA: Diagnosis not present

## 2017-07-19 DIAGNOSIS — E1122 Type 2 diabetes mellitus with diabetic chronic kidney disease: Secondary | ICD-10-CM | POA: Diagnosis not present

## 2017-07-19 DIAGNOSIS — G309 Alzheimer's disease, unspecified: Secondary | ICD-10-CM | POA: Diagnosis not present

## 2017-07-19 DIAGNOSIS — M1712 Unilateral primary osteoarthritis, left knee: Secondary | ICD-10-CM | POA: Diagnosis not present

## 2017-07-19 DIAGNOSIS — F0280 Dementia in other diseases classified elsewhere without behavioral disturbance: Secondary | ICD-10-CM | POA: Diagnosis not present

## 2017-07-19 DIAGNOSIS — N183 Chronic kidney disease, stage 3 (moderate): Secondary | ICD-10-CM | POA: Diagnosis not present

## 2017-07-19 DIAGNOSIS — M353 Polymyalgia rheumatica: Secondary | ICD-10-CM | POA: Diagnosis not present

## 2017-07-20 DIAGNOSIS — M353 Polymyalgia rheumatica: Secondary | ICD-10-CM | POA: Diagnosis not present

## 2017-07-20 DIAGNOSIS — M1712 Unilateral primary osteoarthritis, left knee: Secondary | ICD-10-CM | POA: Diagnosis not present

## 2017-07-20 DIAGNOSIS — N183 Chronic kidney disease, stage 3 (moderate): Secondary | ICD-10-CM | POA: Diagnosis not present

## 2017-07-20 DIAGNOSIS — F0280 Dementia in other diseases classified elsewhere without behavioral disturbance: Secondary | ICD-10-CM | POA: Diagnosis not present

## 2017-07-20 DIAGNOSIS — G309 Alzheimer's disease, unspecified: Secondary | ICD-10-CM | POA: Diagnosis not present

## 2017-07-20 DIAGNOSIS — E1122 Type 2 diabetes mellitus with diabetic chronic kidney disease: Secondary | ICD-10-CM | POA: Diagnosis not present

## 2017-07-25 DIAGNOSIS — E1122 Type 2 diabetes mellitus with diabetic chronic kidney disease: Secondary | ICD-10-CM | POA: Diagnosis not present

## 2017-07-25 DIAGNOSIS — M353 Polymyalgia rheumatica: Secondary | ICD-10-CM | POA: Diagnosis not present

## 2017-07-25 DIAGNOSIS — F0280 Dementia in other diseases classified elsewhere without behavioral disturbance: Secondary | ICD-10-CM | POA: Diagnosis not present

## 2017-07-25 DIAGNOSIS — G309 Alzheimer's disease, unspecified: Secondary | ICD-10-CM | POA: Diagnosis not present

## 2017-07-25 DIAGNOSIS — N183 Chronic kidney disease, stage 3 (moderate): Secondary | ICD-10-CM | POA: Diagnosis not present

## 2017-07-25 DIAGNOSIS — M1712 Unilateral primary osteoarthritis, left knee: Secondary | ICD-10-CM | POA: Diagnosis not present

## 2017-07-27 DIAGNOSIS — M353 Polymyalgia rheumatica: Secondary | ICD-10-CM | POA: Diagnosis not present

## 2017-07-27 DIAGNOSIS — E1122 Type 2 diabetes mellitus with diabetic chronic kidney disease: Secondary | ICD-10-CM | POA: Diagnosis not present

## 2017-07-27 DIAGNOSIS — M1712 Unilateral primary osteoarthritis, left knee: Secondary | ICD-10-CM | POA: Diagnosis not present

## 2017-07-27 DIAGNOSIS — N183 Chronic kidney disease, stage 3 (moderate): Secondary | ICD-10-CM | POA: Diagnosis not present

## 2017-07-27 DIAGNOSIS — G309 Alzheimer's disease, unspecified: Secondary | ICD-10-CM | POA: Diagnosis not present

## 2017-07-27 DIAGNOSIS — F0280 Dementia in other diseases classified elsewhere without behavioral disturbance: Secondary | ICD-10-CM | POA: Diagnosis not present

## 2017-10-04 DIAGNOSIS — Z23 Encounter for immunization: Secondary | ICD-10-CM | POA: Diagnosis not present

## 2017-12-28 DIAGNOSIS — E1122 Type 2 diabetes mellitus with diabetic chronic kidney disease: Secondary | ICD-10-CM | POA: Diagnosis not present

## 2017-12-28 DIAGNOSIS — E7800 Pure hypercholesterolemia, unspecified: Secondary | ICD-10-CM | POA: Diagnosis not present

## 2017-12-28 DIAGNOSIS — I129 Hypertensive chronic kidney disease with stage 1 through stage 4 chronic kidney disease, or unspecified chronic kidney disease: Secondary | ICD-10-CM | POA: Diagnosis not present

## 2017-12-28 DIAGNOSIS — N183 Chronic kidney disease, stage 3 (moderate): Secondary | ICD-10-CM | POA: Diagnosis not present

## 2017-12-28 DIAGNOSIS — G309 Alzheimer's disease, unspecified: Secondary | ICD-10-CM | POA: Diagnosis not present

## 2017-12-28 DIAGNOSIS — F322 Major depressive disorder, single episode, severe without psychotic features: Secondary | ICD-10-CM | POA: Diagnosis not present

## 2018-03-14 DIAGNOSIS — I129 Hypertensive chronic kidney disease with stage 1 through stage 4 chronic kidney disease, or unspecified chronic kidney disease: Secondary | ICD-10-CM | POA: Diagnosis not present

## 2018-03-14 DIAGNOSIS — M1712 Unilateral primary osteoarthritis, left knee: Secondary | ICD-10-CM | POA: Diagnosis not present

## 2018-03-14 DIAGNOSIS — M810 Age-related osteoporosis without current pathological fracture: Secondary | ICD-10-CM | POA: Diagnosis not present

## 2018-03-14 DIAGNOSIS — G309 Alzheimer's disease, unspecified: Secondary | ICD-10-CM | POA: Diagnosis not present

## 2018-03-14 DIAGNOSIS — M19049 Primary osteoarthritis, unspecified hand: Secondary | ICD-10-CM | POA: Diagnosis not present

## 2018-03-14 DIAGNOSIS — F0280 Dementia in other diseases classified elsewhere without behavioral disturbance: Secondary | ICD-10-CM | POA: Diagnosis not present

## 2018-03-16 DIAGNOSIS — I129 Hypertensive chronic kidney disease with stage 1 through stage 4 chronic kidney disease, or unspecified chronic kidney disease: Secondary | ICD-10-CM | POA: Diagnosis not present

## 2018-03-16 DIAGNOSIS — M810 Age-related osteoporosis without current pathological fracture: Secondary | ICD-10-CM | POA: Diagnosis not present

## 2018-03-16 DIAGNOSIS — F0280 Dementia in other diseases classified elsewhere without behavioral disturbance: Secondary | ICD-10-CM | POA: Diagnosis not present

## 2018-03-16 DIAGNOSIS — M1712 Unilateral primary osteoarthritis, left knee: Secondary | ICD-10-CM | POA: Diagnosis not present

## 2018-03-16 DIAGNOSIS — M19049 Primary osteoarthritis, unspecified hand: Secondary | ICD-10-CM | POA: Diagnosis not present

## 2018-03-16 DIAGNOSIS — G309 Alzheimer's disease, unspecified: Secondary | ICD-10-CM | POA: Diagnosis not present

## 2018-03-20 DIAGNOSIS — I129 Hypertensive chronic kidney disease with stage 1 through stage 4 chronic kidney disease, or unspecified chronic kidney disease: Secondary | ICD-10-CM | POA: Diagnosis not present

## 2018-03-20 DIAGNOSIS — G309 Alzheimer's disease, unspecified: Secondary | ICD-10-CM | POA: Diagnosis not present

## 2018-03-20 DIAGNOSIS — M810 Age-related osteoporosis without current pathological fracture: Secondary | ICD-10-CM | POA: Diagnosis not present

## 2018-03-20 DIAGNOSIS — F0280 Dementia in other diseases classified elsewhere without behavioral disturbance: Secondary | ICD-10-CM | POA: Diagnosis not present

## 2018-03-20 DIAGNOSIS — M1712 Unilateral primary osteoarthritis, left knee: Secondary | ICD-10-CM | POA: Diagnosis not present

## 2018-03-20 DIAGNOSIS — M19049 Primary osteoarthritis, unspecified hand: Secondary | ICD-10-CM | POA: Diagnosis not present

## 2018-03-22 DIAGNOSIS — M19049 Primary osteoarthritis, unspecified hand: Secondary | ICD-10-CM | POA: Diagnosis not present

## 2018-03-22 DIAGNOSIS — G309 Alzheimer's disease, unspecified: Secondary | ICD-10-CM | POA: Diagnosis not present

## 2018-03-22 DIAGNOSIS — M1712 Unilateral primary osteoarthritis, left knee: Secondary | ICD-10-CM | POA: Diagnosis not present

## 2018-03-22 DIAGNOSIS — I129 Hypertensive chronic kidney disease with stage 1 through stage 4 chronic kidney disease, or unspecified chronic kidney disease: Secondary | ICD-10-CM | POA: Diagnosis not present

## 2018-03-22 DIAGNOSIS — F0280 Dementia in other diseases classified elsewhere without behavioral disturbance: Secondary | ICD-10-CM | POA: Diagnosis not present

## 2018-03-22 DIAGNOSIS — M810 Age-related osteoporosis without current pathological fracture: Secondary | ICD-10-CM | POA: Diagnosis not present

## 2018-03-26 DIAGNOSIS — I129 Hypertensive chronic kidney disease with stage 1 through stage 4 chronic kidney disease, or unspecified chronic kidney disease: Secondary | ICD-10-CM | POA: Diagnosis not present

## 2018-03-26 DIAGNOSIS — M810 Age-related osteoporosis without current pathological fracture: Secondary | ICD-10-CM | POA: Diagnosis not present

## 2018-03-26 DIAGNOSIS — F0280 Dementia in other diseases classified elsewhere without behavioral disturbance: Secondary | ICD-10-CM | POA: Diagnosis not present

## 2018-03-26 DIAGNOSIS — G309 Alzheimer's disease, unspecified: Secondary | ICD-10-CM | POA: Diagnosis not present

## 2018-03-26 DIAGNOSIS — M1712 Unilateral primary osteoarthritis, left knee: Secondary | ICD-10-CM | POA: Diagnosis not present

## 2018-03-26 DIAGNOSIS — M19049 Primary osteoarthritis, unspecified hand: Secondary | ICD-10-CM | POA: Diagnosis not present

## 2018-03-28 DIAGNOSIS — F0280 Dementia in other diseases classified elsewhere without behavioral disturbance: Secondary | ICD-10-CM | POA: Diagnosis not present

## 2018-03-28 DIAGNOSIS — I129 Hypertensive chronic kidney disease with stage 1 through stage 4 chronic kidney disease, or unspecified chronic kidney disease: Secondary | ICD-10-CM | POA: Diagnosis not present

## 2018-03-28 DIAGNOSIS — M1712 Unilateral primary osteoarthritis, left knee: Secondary | ICD-10-CM | POA: Diagnosis not present

## 2018-03-28 DIAGNOSIS — G309 Alzheimer's disease, unspecified: Secondary | ICD-10-CM | POA: Diagnosis not present

## 2018-03-28 DIAGNOSIS — M810 Age-related osteoporosis without current pathological fracture: Secondary | ICD-10-CM | POA: Diagnosis not present

## 2018-03-28 DIAGNOSIS — M19049 Primary osteoarthritis, unspecified hand: Secondary | ICD-10-CM | POA: Diagnosis not present

## 2018-04-04 DIAGNOSIS — M19049 Primary osteoarthritis, unspecified hand: Secondary | ICD-10-CM | POA: Diagnosis not present

## 2018-04-04 DIAGNOSIS — F0280 Dementia in other diseases classified elsewhere without behavioral disturbance: Secondary | ICD-10-CM | POA: Diagnosis not present

## 2018-04-04 DIAGNOSIS — I129 Hypertensive chronic kidney disease with stage 1 through stage 4 chronic kidney disease, or unspecified chronic kidney disease: Secondary | ICD-10-CM | POA: Diagnosis not present

## 2018-04-04 DIAGNOSIS — M1712 Unilateral primary osteoarthritis, left knee: Secondary | ICD-10-CM | POA: Diagnosis not present

## 2018-04-04 DIAGNOSIS — M810 Age-related osteoporosis without current pathological fracture: Secondary | ICD-10-CM | POA: Diagnosis not present

## 2018-04-04 DIAGNOSIS — G309 Alzheimer's disease, unspecified: Secondary | ICD-10-CM | POA: Diagnosis not present

## 2018-04-05 DIAGNOSIS — M19049 Primary osteoarthritis, unspecified hand: Secondary | ICD-10-CM | POA: Diagnosis not present

## 2018-04-05 DIAGNOSIS — M1712 Unilateral primary osteoarthritis, left knee: Secondary | ICD-10-CM | POA: Diagnosis not present

## 2018-04-05 DIAGNOSIS — M810 Age-related osteoporosis without current pathological fracture: Secondary | ICD-10-CM | POA: Diagnosis not present

## 2018-04-05 DIAGNOSIS — G309 Alzheimer's disease, unspecified: Secondary | ICD-10-CM | POA: Diagnosis not present

## 2018-04-05 DIAGNOSIS — I129 Hypertensive chronic kidney disease with stage 1 through stage 4 chronic kidney disease, or unspecified chronic kidney disease: Secondary | ICD-10-CM | POA: Diagnosis not present

## 2018-04-05 DIAGNOSIS — F0280 Dementia in other diseases classified elsewhere without behavioral disturbance: Secondary | ICD-10-CM | POA: Diagnosis not present

## 2018-04-10 DIAGNOSIS — M1712 Unilateral primary osteoarthritis, left knee: Secondary | ICD-10-CM | POA: Diagnosis not present

## 2018-04-10 DIAGNOSIS — I129 Hypertensive chronic kidney disease with stage 1 through stage 4 chronic kidney disease, or unspecified chronic kidney disease: Secondary | ICD-10-CM | POA: Diagnosis not present

## 2018-04-10 DIAGNOSIS — G309 Alzheimer's disease, unspecified: Secondary | ICD-10-CM | POA: Diagnosis not present

## 2018-04-10 DIAGNOSIS — F0280 Dementia in other diseases classified elsewhere without behavioral disturbance: Secondary | ICD-10-CM | POA: Diagnosis not present

## 2018-04-10 DIAGNOSIS — M19049 Primary osteoarthritis, unspecified hand: Secondary | ICD-10-CM | POA: Diagnosis not present

## 2018-04-10 DIAGNOSIS — M810 Age-related osteoporosis without current pathological fracture: Secondary | ICD-10-CM | POA: Diagnosis not present

## 2018-04-17 DIAGNOSIS — M19049 Primary osteoarthritis, unspecified hand: Secondary | ICD-10-CM | POA: Diagnosis not present

## 2018-04-17 DIAGNOSIS — I129 Hypertensive chronic kidney disease with stage 1 through stage 4 chronic kidney disease, or unspecified chronic kidney disease: Secondary | ICD-10-CM | POA: Diagnosis not present

## 2018-04-17 DIAGNOSIS — M810 Age-related osteoporosis without current pathological fracture: Secondary | ICD-10-CM | POA: Diagnosis not present

## 2018-04-17 DIAGNOSIS — M1712 Unilateral primary osteoarthritis, left knee: Secondary | ICD-10-CM | POA: Diagnosis not present

## 2018-04-17 DIAGNOSIS — G309 Alzheimer's disease, unspecified: Secondary | ICD-10-CM | POA: Diagnosis not present

## 2018-04-17 DIAGNOSIS — F0280 Dementia in other diseases classified elsewhere without behavioral disturbance: Secondary | ICD-10-CM | POA: Diagnosis not present

## 2018-04-18 DIAGNOSIS — R296 Repeated falls: Secondary | ICD-10-CM | POA: Diagnosis not present

## 2018-04-19 DIAGNOSIS — M810 Age-related osteoporosis without current pathological fracture: Secondary | ICD-10-CM | POA: Diagnosis not present

## 2018-04-19 DIAGNOSIS — M19049 Primary osteoarthritis, unspecified hand: Secondary | ICD-10-CM | POA: Diagnosis not present

## 2018-04-19 DIAGNOSIS — I129 Hypertensive chronic kidney disease with stage 1 through stage 4 chronic kidney disease, or unspecified chronic kidney disease: Secondary | ICD-10-CM | POA: Diagnosis not present

## 2018-04-19 DIAGNOSIS — F0280 Dementia in other diseases classified elsewhere without behavioral disturbance: Secondary | ICD-10-CM | POA: Diagnosis not present

## 2018-04-19 DIAGNOSIS — M1712 Unilateral primary osteoarthritis, left knee: Secondary | ICD-10-CM | POA: Diagnosis not present

## 2018-04-19 DIAGNOSIS — G309 Alzheimer's disease, unspecified: Secondary | ICD-10-CM | POA: Diagnosis not present

## 2018-04-24 DIAGNOSIS — M19049 Primary osteoarthritis, unspecified hand: Secondary | ICD-10-CM | POA: Diagnosis not present

## 2018-04-24 DIAGNOSIS — F0280 Dementia in other diseases classified elsewhere without behavioral disturbance: Secondary | ICD-10-CM | POA: Diagnosis not present

## 2018-04-24 DIAGNOSIS — M810 Age-related osteoporosis without current pathological fracture: Secondary | ICD-10-CM | POA: Diagnosis not present

## 2018-04-24 DIAGNOSIS — M1712 Unilateral primary osteoarthritis, left knee: Secondary | ICD-10-CM | POA: Diagnosis not present

## 2018-04-24 DIAGNOSIS — G309 Alzheimer's disease, unspecified: Secondary | ICD-10-CM | POA: Diagnosis not present

## 2018-04-24 DIAGNOSIS — I129 Hypertensive chronic kidney disease with stage 1 through stage 4 chronic kidney disease, or unspecified chronic kidney disease: Secondary | ICD-10-CM | POA: Diagnosis not present

## 2018-04-26 DIAGNOSIS — I129 Hypertensive chronic kidney disease with stage 1 through stage 4 chronic kidney disease, or unspecified chronic kidney disease: Secondary | ICD-10-CM | POA: Diagnosis not present

## 2018-04-26 DIAGNOSIS — M19049 Primary osteoarthritis, unspecified hand: Secondary | ICD-10-CM | POA: Diagnosis not present

## 2018-04-26 DIAGNOSIS — M1712 Unilateral primary osteoarthritis, left knee: Secondary | ICD-10-CM | POA: Diagnosis not present

## 2018-04-26 DIAGNOSIS — M810 Age-related osteoporosis without current pathological fracture: Secondary | ICD-10-CM | POA: Diagnosis not present

## 2018-04-26 DIAGNOSIS — F0280 Dementia in other diseases classified elsewhere without behavioral disturbance: Secondary | ICD-10-CM | POA: Diagnosis not present

## 2018-04-26 DIAGNOSIS — G309 Alzheimer's disease, unspecified: Secondary | ICD-10-CM | POA: Diagnosis not present

## 2018-04-30 DIAGNOSIS — I129 Hypertensive chronic kidney disease with stage 1 through stage 4 chronic kidney disease, or unspecified chronic kidney disease: Secondary | ICD-10-CM | POA: Diagnosis not present

## 2018-04-30 DIAGNOSIS — F0280 Dementia in other diseases classified elsewhere without behavioral disturbance: Secondary | ICD-10-CM | POA: Diagnosis not present

## 2018-04-30 DIAGNOSIS — M810 Age-related osteoporosis without current pathological fracture: Secondary | ICD-10-CM | POA: Diagnosis not present

## 2018-04-30 DIAGNOSIS — M19049 Primary osteoarthritis, unspecified hand: Secondary | ICD-10-CM | POA: Diagnosis not present

## 2018-04-30 DIAGNOSIS — M1712 Unilateral primary osteoarthritis, left knee: Secondary | ICD-10-CM | POA: Diagnosis not present

## 2018-04-30 DIAGNOSIS — G309 Alzheimer's disease, unspecified: Secondary | ICD-10-CM | POA: Diagnosis not present

## 2018-05-01 DIAGNOSIS — M19049 Primary osteoarthritis, unspecified hand: Secondary | ICD-10-CM | POA: Diagnosis not present

## 2018-05-01 DIAGNOSIS — M810 Age-related osteoporosis without current pathological fracture: Secondary | ICD-10-CM | POA: Diagnosis not present

## 2018-05-01 DIAGNOSIS — I129 Hypertensive chronic kidney disease with stage 1 through stage 4 chronic kidney disease, or unspecified chronic kidney disease: Secondary | ICD-10-CM | POA: Diagnosis not present

## 2018-05-01 DIAGNOSIS — F0280 Dementia in other diseases classified elsewhere without behavioral disturbance: Secondary | ICD-10-CM | POA: Diagnosis not present

## 2018-05-01 DIAGNOSIS — G309 Alzheimer's disease, unspecified: Secondary | ICD-10-CM | POA: Diagnosis not present

## 2018-05-01 DIAGNOSIS — M1712 Unilateral primary osteoarthritis, left knee: Secondary | ICD-10-CM | POA: Diagnosis not present

## 2018-05-03 DIAGNOSIS — G309 Alzheimer's disease, unspecified: Secondary | ICD-10-CM | POA: Diagnosis not present

## 2018-05-03 DIAGNOSIS — M1712 Unilateral primary osteoarthritis, left knee: Secondary | ICD-10-CM | POA: Diagnosis not present

## 2018-05-03 DIAGNOSIS — F0280 Dementia in other diseases classified elsewhere without behavioral disturbance: Secondary | ICD-10-CM | POA: Diagnosis not present

## 2018-05-03 DIAGNOSIS — M19049 Primary osteoarthritis, unspecified hand: Secondary | ICD-10-CM | POA: Diagnosis not present

## 2018-05-03 DIAGNOSIS — I129 Hypertensive chronic kidney disease with stage 1 through stage 4 chronic kidney disease, or unspecified chronic kidney disease: Secondary | ICD-10-CM | POA: Diagnosis not present

## 2018-05-03 DIAGNOSIS — M810 Age-related osteoporosis without current pathological fracture: Secondary | ICD-10-CM | POA: Diagnosis not present

## 2018-07-05 DIAGNOSIS — J301 Allergic rhinitis due to pollen: Secondary | ICD-10-CM | POA: Diagnosis not present

## 2018-07-05 DIAGNOSIS — E7800 Pure hypercholesterolemia, unspecified: Secondary | ICD-10-CM | POA: Diagnosis not present

## 2018-07-05 DIAGNOSIS — M810 Age-related osteoporosis without current pathological fracture: Secondary | ICD-10-CM | POA: Diagnosis not present

## 2018-07-05 DIAGNOSIS — I129 Hypertensive chronic kidney disease with stage 1 through stage 4 chronic kidney disease, or unspecified chronic kidney disease: Secondary | ICD-10-CM | POA: Diagnosis not present

## 2018-07-05 DIAGNOSIS — F322 Major depressive disorder, single episode, severe without psychotic features: Secondary | ICD-10-CM | POA: Diagnosis not present

## 2018-07-05 DIAGNOSIS — N3281 Overactive bladder: Secondary | ICD-10-CM | POA: Diagnosis not present

## 2018-07-05 DIAGNOSIS — M353 Polymyalgia rheumatica: Secondary | ICD-10-CM | POA: Diagnosis not present

## 2018-07-05 DIAGNOSIS — Z0000 Encounter for general adult medical examination without abnormal findings: Secondary | ICD-10-CM | POA: Diagnosis not present

## 2018-07-05 DIAGNOSIS — E1122 Type 2 diabetes mellitus with diabetic chronic kidney disease: Secondary | ICD-10-CM | POA: Diagnosis not present

## 2018-07-05 DIAGNOSIS — G309 Alzheimer's disease, unspecified: Secondary | ICD-10-CM | POA: Diagnosis not present

## 2018-07-05 DIAGNOSIS — N183 Chronic kidney disease, stage 3 (moderate): Secondary | ICD-10-CM | POA: Diagnosis not present

## 2018-08-25 DIAGNOSIS — Z23 Encounter for immunization: Secondary | ICD-10-CM | POA: Diagnosis not present

## 2018-12-26 ENCOUNTER — Other Ambulatory Visit: Payer: Self-pay

## 2018-12-26 ENCOUNTER — Emergency Department (HOSPITAL_COMMUNITY)
Admission: EM | Admit: 2018-12-26 | Discharge: 2018-12-26 | Disposition: A | Discharge status: Skilled Nursing Facility | Payer: Medicare Other | Attending: Emergency Medicine | Admitting: Emergency Medicine

## 2018-12-26 DIAGNOSIS — R0902 Hypoxemia: Secondary | ICD-10-CM | POA: Diagnosis not present

## 2018-12-26 DIAGNOSIS — E119 Type 2 diabetes mellitus without complications: Secondary | ICD-10-CM | POA: Diagnosis not present

## 2018-12-26 DIAGNOSIS — Z8673 Personal history of transient ischemic attack (TIA), and cerebral infarction without residual deficits: Secondary | ICD-10-CM | POA: Diagnosis not present

## 2018-12-26 DIAGNOSIS — Z7982 Long term (current) use of aspirin: Secondary | ICD-10-CM | POA: Diagnosis not present

## 2018-12-26 DIAGNOSIS — K529 Noninfective gastroenteritis and colitis, unspecified: Secondary | ICD-10-CM | POA: Insufficient documentation

## 2018-12-26 DIAGNOSIS — Z79899 Other long term (current) drug therapy: Secondary | ICD-10-CM | POA: Insufficient documentation

## 2018-12-26 DIAGNOSIS — R197 Diarrhea, unspecified: Secondary | ICD-10-CM | POA: Diagnosis not present

## 2018-12-26 DIAGNOSIS — Z87891 Personal history of nicotine dependence: Secondary | ICD-10-CM | POA: Diagnosis not present

## 2018-12-26 DIAGNOSIS — I959 Hypotension, unspecified: Secondary | ICD-10-CM | POA: Diagnosis not present

## 2018-12-26 DIAGNOSIS — Z743 Need for continuous supervision: Secondary | ICD-10-CM | POA: Diagnosis not present

## 2018-12-26 DIAGNOSIS — R410 Disorientation, unspecified: Secondary | ICD-10-CM | POA: Diagnosis not present

## 2018-12-26 DIAGNOSIS — R531 Weakness: Secondary | ICD-10-CM | POA: Diagnosis not present

## 2018-12-26 DIAGNOSIS — R279 Unspecified lack of coordination: Secondary | ICD-10-CM | POA: Diagnosis not present

## 2018-12-26 DIAGNOSIS — R404 Transient alteration of awareness: Secondary | ICD-10-CM | POA: Diagnosis not present

## 2018-12-26 DIAGNOSIS — E860 Dehydration: Secondary | ICD-10-CM | POA: Diagnosis not present

## 2018-12-26 LAB — CBC WITH DIFFERENTIAL/PLATELET
Abs Immature Granulocytes: 0.04 10*3/uL (ref 0.00–0.07)
BASOS ABS: 0 10*3/uL (ref 0.0–0.1)
Basophils Relative: 0 %
EOS PCT: 7 %
Eosinophils Absolute: 0.5 10*3/uL (ref 0.0–0.5)
HCT: 45.1 % (ref 36.0–46.0)
Hemoglobin: 14 g/dL (ref 12.0–15.0)
Immature Granulocytes: 1 %
Lymphocytes Relative: 23 %
Lymphs Abs: 1.8 10*3/uL (ref 0.7–4.0)
MCH: 29.4 pg (ref 26.0–34.0)
MCHC: 31 g/dL (ref 30.0–36.0)
MCV: 94.5 fL (ref 80.0–100.0)
MONO ABS: 0.8 10*3/uL (ref 0.1–1.0)
Monocytes Relative: 10 %
NRBC: 0 % (ref 0.0–0.2)
Neutro Abs: 4.7 10*3/uL (ref 1.7–7.7)
Neutrophils Relative %: 59 %
Platelets: 213 10*3/uL (ref 150–400)
RBC: 4.77 MIL/uL (ref 3.87–5.11)
RDW: 13.4 % (ref 11.5–15.5)
WBC: 7.9 10*3/uL (ref 4.0–10.5)

## 2018-12-26 LAB — COMPREHENSIVE METABOLIC PANEL
ALBUMIN: 3.9 g/dL (ref 3.5–5.0)
ALT: 14 U/L (ref 0–44)
ANION GAP: 8 (ref 5–15)
AST: 27 U/L (ref 15–41)
Alkaline Phosphatase: 53 U/L (ref 38–126)
BUN: 33 mg/dL — ABNORMAL HIGH (ref 8–23)
CALCIUM: 9.1 mg/dL (ref 8.9–10.3)
CO2: 24 mmol/L (ref 22–32)
Chloride: 105 mmol/L (ref 98–111)
Creatinine, Ser: 1.43 mg/dL — ABNORMAL HIGH (ref 0.44–1.00)
GFR calc Af Amer: 39 mL/min — ABNORMAL LOW (ref >60)
GFR calc non Af Amer: 34 mL/min — ABNORMAL LOW (ref >60)
GLUCOSE: 105 mg/dL — AB (ref 70–99)
Potassium: 4.2 mmol/L (ref 3.5–5.1)
SODIUM: 137 mmol/L (ref 135–145)
TOTAL PROTEIN: 7.3 g/dL (ref 6.5–8.1)
Total Bilirubin: 0.6 mg/dL (ref 0.3–1.2)

## 2018-12-26 LAB — INFLUENZA PANEL BY PCR (TYPE A & B)
Influenza A By PCR: NEGATIVE
Influenza B By PCR: NEGATIVE

## 2018-12-26 MED ORDER — SODIUM CHLORIDE 0.9 % IV BOLUS
500.0000 mL | Freq: Once | INTRAVENOUS | Status: AC
  Administered 2018-12-26: 500 mL via INTRAVENOUS

## 2018-12-26 NOTE — ED Notes (Signed)
UA clicked off by accident. Pt aware urine sample is needed. Requisition form and specimen cup at bedside. RN made aware

## 2018-12-26 NOTE — ED Notes (Signed)
Pt was given saltine crackers and ginger ale for PO challenge, which she tolerated fine, no c/o n/v/d.

## 2018-12-26 NOTE — ED Notes (Signed)
Report given to Mardene Celeste, nurse at [facility] at [facility].

## 2018-12-26 NOTE — Discharge Instructions (Signed)
Ms. Vickery, all your labs look good. You most likely had a viral stomach bug. I'd like you to follow-up with your primary care doctor in the next week or 2 to be sure you are still feeling well.  Please return if you develop fevers, stomach pain, or have develop persistent vomiting or diarrhea.

## 2018-12-26 NOTE — ED Notes (Signed)
Pt ambulated to bathroom to attempt to urinate, was not able to, informed pt we would need to do in and out cath to collect urine specimen, pt vehemently denied. Dr Particia Nearing notified and Dr Chesley Mires notified.

## 2018-12-26 NOTE — ED Notes (Signed)
[name], med tech from [facility] called asking if pt was positive for the c-diff. Explained to the caller pt was not tested, as she did not have BM. Caller then asked if possible to test pt regardless. Informed pt that provider will be notified of their request.

## 2018-12-26 NOTE — ED Notes (Signed)
[facility] paged. 

## 2018-12-26 NOTE — ED Triage Notes (Signed)
PT BIBA from [facility] at [facility] with c/o diarrhea x3 yesterday, 1x today.  Also c/o 1 emesis occurrence today.  Denies CP, SHOB, dizziness, weakness.  Staff reports pt AOx3 at baseline (unable to state year).

## 2018-12-26 NOTE — ED Provider Notes (Signed)
[facility]-EMERGENCY DEPT Provider Note   CSN: [id number] Arrival date & time: 12/26/18  1443     History   Chief Complaint Chief Complaint  Patient presents with  . Diarrhea    HPI [name] is a 85 y.o. female presenting via ambulance from [facility] at [facility] with diarrhea beginning yesterday and an episode of emesis today. Ms. Boisselle does not have any complaints today. She denies fevers, chills, light headedness, nasal congestion, sore throat, cough, chest pain, shortness of breath, abdominal pain, melena, hematochezia, hematemesis. She has not been around any sick contacts or had recent antibiotics. Denies eating anything out of the ordinary for her. She does note decreased PO intake since symptom onset.     Past Medical History:  Diagnosis Date  . Anemia, iron deficiency 12/12/2011  . Diabetes mellitus    no mes, diet only   . Hyperlipemia   . Osteoporosis     Patient Active Problem List   Diagnosis Date Noted  . Mild cognitive impairment 07/15/2015  . Acute confusional state 06/19/2015  . Memory loss 06/19/2015  . Diabetes mellitus 05/14/2015  . Hyperlipidemia 05/14/2015  . Borderline diabetes mellitus   . Essential hypertension, benign   . TIA (transient ischemic attack) 05/13/2015  . Anemia, iron deficiency 12/12/2011  . Humerus fracture 12/11/2011  . Hypoxia 12/11/2011  . Wide-complex tachycardia (HCC) 12/11/2011  . Hypotension 12/11/2011    Past Surgical History:  Procedure Laterality Date  . left knee arthroscopy    . ORIF HUMERUS FRACTURE  12/09/2011   Procedure: OPEN REDUCTION INTERNAL FIXATION (ORIF) DISTAL HUMERUS FRACTURE;  Surgeon: Sharma Covert, MD;  Location: [facility];  Service: Orthopedics;  Laterality: Right;  . OTHER SURGICAL HISTORY     left small toe bone spur removed   . TONSILLECTOMY       OB History   No obstetric history on file.      Home Medications    Prior to Admission medications     Medication Sig Start Date End Date Taking? Authorizing Provider  acetaminophen (MAPAP) 500 MG tablet Take 500 mg by mouth 2 (two) times daily as needed for headache.   Yes [provider]  aspirin EC 81 MG EC tablet Take 1 tablet (81 mg total) by mouth daily. 05/15/15  Yes Joseph Art, DO  Calcium Carbonate-Vitamin D3 (CALCIUM 600-D) 600-400 MG-UNIT TABS Take 2 tablets by mouth every morning.   Yes [provider]  docusate sodium (COLACE) 100 MG capsule Take 1 capsule (100 mg total) by mouth every 12 (twelve) hours. 12/27/16  Yes Ward, Baxter Hire N, DO  lisinopril (PRINIVIL,ZESTRIL) 10 MG tablet Take 10 mg by mouth daily.   Yes [provider]  Melatonin 3 MG TABS Take 3 mg by mouth at bedtime.   Yes [provider]  meloxicam (MOBIC) 15 MG tablet Take 15 mg by mouth daily.   Yes [provider]  memantine (NAMENDA) 10 MG tablet Take 10 mg by mouth 2 (two) times daily.   Yes [provider]  metoprolol tartrate (LOPRESSOR) 25 MG tablet Take 0.5 tablets (12.5 mg total) by mouth 2 (two) times daily. Patient taking differently: Take 25 mg by mouth 2 (two) times daily.  05/15/15  Yes Vann, Jessica U, DO  sertraline (ZOLOFT) 100 MG tablet Take 100 mg by mouth at bedtime.   Yes [provider]  simvastatin (ZOCOR) 20 MG tablet Take 1 tablet (20 mg total) by mouth daily  at 6 PM. Patient taking differently: Take 20 mg by mouth at bedtime.  05/15/15  Yes Marlin CanaryVann, Jessica U, DO  vitamin B-12 1000 MCG tablet Take 1 tablet (1,000 mcg total) by mouth daily. Patient taking differently: Take 1,000 mcg by mouth 2 (two) times daily.  05/15/15  Yes Vann, Jessica U, DO  donepezil (ARICEPT) 10 MG tablet Take 1 tablet (10 mg total) by mouth at bedtime. Patient not taking: Reported on 12/30/2016 02/04/16   Van ClinesAquino, Karen M, MD  oxyCODONE-acetaminophen (PERCOCET/ROXICET) 5-325 MG tablet Take 1 tablet by mouth every 6 (six) hours as needed. Patient not taking:  Reported on 12/26/2018 12/27/16   Ward, Layla MawKristen N, DO    Family History No family history on file.  Social History Social History   Tobacco Use  . Smoking status: Former Smoker    Types: Cigarettes    Last attempt to quit: 12/29/2009    Years since quitting: 8.9  . Smokeless tobacco: Never Used  Substance Use Topics  . Alcohol use: No    Alcohol/week: 0.0 standard drinks  . Drug use: No     Allergies   Sulfa antibiotics and Latex   Review of Systems Review of Systems  All other systems reviewed and are negative.    Physical Exam Updated Vital Signs BP 120/87   Pulse (!) 52   Temp 97.9 F (36.6 C) (Oral)   Resp 16   Ht 5\' 3"  (1.6 m)   Wt 74.8 kg   SpO2 95%   BMI 29.23 kg/m   Physical Exam Vitals signs reviewed.  Constitutional:      General: She is not in acute distress.    Appearance: She is not toxic-appearing.  HENT:     Head: Normocephalic and atraumatic.     Mouth/Throat:     Mouth: Mucous membranes are moist.     Pharynx: No oropharyngeal exudate or posterior oropharyngeal erythema.  Neck:     Musculoskeletal: Neck supple.  Cardiovascular:     Rate and Rhythm: Normal rate and regular rhythm.  Pulmonary:     Effort: Pulmonary effort is normal.     Breath sounds: Normal breath sounds.  Abdominal:     General: Bowel sounds are normal. There is no distension.     Palpations: Abdomen is soft.     Tenderness: There is no abdominal tenderness.  Musculoskeletal:        General: No swelling.  Lymphadenopathy:     Cervical: No cervical adenopathy.  Skin:    General: Skin is warm and dry.     Coloration: Skin is not jaundiced.  Neurological:     General: No focal deficit present.     Mental Status: She is alert. Mental status is at baseline.      ED Treatments / Results  Labs (all labs ordered are listed, but only abnormal results are displayed) Labs Reviewed  COMPREHENSIVE METABOLIC PANEL - Abnormal; Notable for the following components:       Result Value   Glucose, Bld 105 (*)    BUN 33 (*)    Creatinine, Ser 1.43 (*)    GFR calc non Af Amer 34 (*)    GFR calc Af Amer 39 (*)    All other components within normal limits  CBC WITH DIFFERENTIAL/PLATELET  INFLUENZA PANEL BY PCR (TYPE A & B)  URINALYSIS, ROUTINE W REFLEX MICROSCOPIC    EKG None  Radiology No results found.  Procedures Procedures (including critical care time)  Medications Ordered  in ED Medications  sodium chloride 0.9 % bolus 500 mL (500 mLs Intravenous New Bag/Given 12/26/18 1601)     Initial Impression / Assessment and Plan / ED Course  I have reviewed the triage vital signs and the nursing notes.  Pertinent labs & imaging results that were available during my care of the patient were reviewed by me and considered in my medical decision making (see chart for details).   Patient presenting with 2 day history of diarrhea, n/v. She is afebrile, and physical exam is reassuring. Will obtain labs and administer IVF. Likely viral gastroenteritis. She presents from assisted living so will check influenza. No recent antibiotics to suspect C. Diff.     Final Clinical Impressions(s) / ED Diagnoses   Final diagnoses:  Gastroenteritis   Patient's work-up was unremarkable. She has had no further emesis or diarrhea and is tolerating PO. Likely viral gastroenteritis. She is medically stable for discharge back to assisted living facility.   ED Discharge Orders    None       Bridget Hartshorn, DO 12/26/18 1826    Jacalyn Lefevre, MD 12/26/18 [phone]

## 2019-01-03 DIAGNOSIS — E860 Dehydration: Secondary | ICD-10-CM | POA: Diagnosis not present

## 2019-01-03 DIAGNOSIS — R197 Diarrhea, unspecified: Secondary | ICD-10-CM | POA: Diagnosis not present

## 2019-01-03 DIAGNOSIS — G309 Alzheimer's disease, unspecified: Secondary | ICD-10-CM | POA: Diagnosis not present

## 2019-01-03 DIAGNOSIS — N179 Acute kidney failure, unspecified: Secondary | ICD-10-CM | POA: Diagnosis not present

## 2019-01-04 DIAGNOSIS — N179 Acute kidney failure, unspecified: Secondary | ICD-10-CM | POA: Diagnosis not present

## 2019-01-04 DIAGNOSIS — D649 Anemia, unspecified: Secondary | ICD-10-CM | POA: Diagnosis not present

## 2019-01-07 DIAGNOSIS — D649 Anemia, unspecified: Secondary | ICD-10-CM | POA: Diagnosis not present

## 2019-01-07 DIAGNOSIS — N179 Acute kidney failure, unspecified: Secondary | ICD-10-CM | POA: Diagnosis not present

## 2019-01-23 ENCOUNTER — Emergency Department (HOSPITAL_COMMUNITY): Payer: Medicare Other

## 2019-01-23 ENCOUNTER — Encounter (HOSPITAL_COMMUNITY): Payer: Self-pay | Admitting: *Deleted

## 2019-01-23 ENCOUNTER — Other Ambulatory Visit: Payer: Self-pay

## 2019-01-23 ENCOUNTER — Observation Stay (HOSPITAL_COMMUNITY)
Admission: EM | Admit: 2019-01-23 | Discharge: 2019-01-24 | Payer: Medicare Other | Attending: Internal Medicine | Admitting: Internal Medicine

## 2019-01-23 DIAGNOSIS — E118 Type 2 diabetes mellitus with unspecified complications: Secondary | ICD-10-CM

## 2019-01-23 DIAGNOSIS — F0390 Unspecified dementia without behavioral disturbance: Secondary | ICD-10-CM | POA: Diagnosis not present

## 2019-01-23 DIAGNOSIS — N390 Urinary tract infection, site not specified: Secondary | ICD-10-CM

## 2019-01-23 DIAGNOSIS — R197 Diarrhea, unspecified: Secondary | ICD-10-CM | POA: Diagnosis not present

## 2019-01-23 DIAGNOSIS — N183 Chronic kidney disease, stage 3 (moderate): Secondary | ICD-10-CM | POA: Diagnosis not present

## 2019-01-23 DIAGNOSIS — D72829 Elevated white blood cell count, unspecified: Secondary | ICD-10-CM | POA: Diagnosis present

## 2019-01-23 DIAGNOSIS — I471 Supraventricular tachycardia: Secondary | ICD-10-CM | POA: Insufficient documentation

## 2019-01-23 DIAGNOSIS — F329 Major depressive disorder, single episode, unspecified: Secondary | ICD-10-CM | POA: Diagnosis not present

## 2019-01-23 DIAGNOSIS — Z882 Allergy status to sulfonamides status: Secondary | ICD-10-CM | POA: Insufficient documentation

## 2019-01-23 DIAGNOSIS — I700 Atherosclerosis of aorta: Secondary | ICD-10-CM | POA: Diagnosis not present

## 2019-01-23 DIAGNOSIS — D509 Iron deficiency anemia, unspecified: Secondary | ICD-10-CM | POA: Insufficient documentation

## 2019-01-23 DIAGNOSIS — R55 Syncope and collapse: Principal | ICD-10-CM | POA: Insufficient documentation

## 2019-01-23 DIAGNOSIS — Z7982 Long term (current) use of aspirin: Secondary | ICD-10-CM | POA: Diagnosis not present

## 2019-01-23 DIAGNOSIS — R9431 Abnormal electrocardiogram [ECG] [EKG]: Secondary | ICD-10-CM

## 2019-01-23 DIAGNOSIS — I129 Hypertensive chronic kidney disease with stage 1 through stage 4 chronic kidney disease, or unspecified chronic kidney disease: Secondary | ICD-10-CM | POA: Diagnosis not present

## 2019-01-23 DIAGNOSIS — Z8673 Personal history of transient ischemic attack (TIA), and cerebral infarction without residual deficits: Secondary | ICD-10-CM | POA: Insufficient documentation

## 2019-01-23 DIAGNOSIS — I959 Hypotension, unspecified: Secondary | ICD-10-CM | POA: Diagnosis not present

## 2019-01-23 DIAGNOSIS — E785 Hyperlipidemia, unspecified: Secondary | ICD-10-CM | POA: Insufficient documentation

## 2019-01-23 DIAGNOSIS — R4020 Unspecified coma: Secondary | ICD-10-CM | POA: Diagnosis not present

## 2019-01-23 DIAGNOSIS — E1122 Type 2 diabetes mellitus with diabetic chronic kidney disease: Secondary | ICD-10-CM | POA: Diagnosis not present

## 2019-01-23 DIAGNOSIS — Z79899 Other long term (current) drug therapy: Secondary | ICD-10-CM | POA: Diagnosis not present

## 2019-01-23 DIAGNOSIS — R1111 Vomiting without nausea: Secondary | ICD-10-CM | POA: Diagnosis not present

## 2019-01-23 DIAGNOSIS — D72823 Leukemoid reaction: Secondary | ICD-10-CM

## 2019-01-23 DIAGNOSIS — E119 Type 2 diabetes mellitus without complications: Secondary | ICD-10-CM

## 2019-01-23 DIAGNOSIS — R0902 Hypoxemia: Secondary | ICD-10-CM | POA: Diagnosis not present

## 2019-01-23 LAB — BASIC METABOLIC PANEL
Anion gap: 12 (ref 5–15)
BUN: 23 mg/dL (ref 8–23)
CALCIUM: 9.1 mg/dL (ref 8.9–10.3)
CO2: 25 mmol/L (ref 22–32)
Chloride: 103 mmol/L (ref 98–111)
Creatinine, Ser: 1.53 mg/dL — ABNORMAL HIGH (ref 0.44–1.00)
GFR calc Af Amer: 36 mL/min — ABNORMAL LOW (ref >60)
GFR calc non Af Amer: 31 mL/min — ABNORMAL LOW (ref >60)
Glucose, Bld: 100 mg/dL — ABNORMAL HIGH (ref 70–99)
Potassium: 3.7 mmol/L (ref 3.5–5.1)
Sodium: 140 mmol/L (ref 135–145)

## 2019-01-23 LAB — URINALYSIS, ROUTINE W REFLEX MICROSCOPIC
Bilirubin Urine: NEGATIVE
Glucose, UA: NEGATIVE mg/dL
Ketones, ur: 20 mg/dL — AB
Nitrite: POSITIVE — AB
Protein, ur: NEGATIVE mg/dL
Specific Gravity, Urine: 1.013 (ref 1.005–1.030)
pH: 5 (ref 5.0–8.0)

## 2019-01-23 LAB — CBC WITH DIFFERENTIAL/PLATELET
Abs Immature Granulocytes: 0.07 10*3/uL (ref 0.00–0.07)
Basophils Absolute: 0.1 10*3/uL (ref 0.0–0.1)
Basophils Relative: 1 %
EOS PCT: 1 %
Eosinophils Absolute: 0.1 10*3/uL (ref 0.0–0.5)
HCT: 43.5 % (ref 36.0–46.0)
HEMOGLOBIN: 13.3 g/dL (ref 12.0–15.0)
Immature Granulocytes: 1 %
LYMPHS PCT: 12 %
Lymphs Abs: 1.5 10*3/uL (ref 0.7–4.0)
MCH: 29.1 pg (ref 26.0–34.0)
MCHC: 30.6 g/dL (ref 30.0–36.0)
MCV: 95.2 fL (ref 80.0–100.0)
Monocytes Absolute: 0.7 10*3/uL (ref 0.1–1.0)
Monocytes Relative: 5 %
Neutro Abs: 10.2 10*3/uL — ABNORMAL HIGH (ref 1.7–7.7)
Neutrophils Relative %: 80 %
Platelets: 236 10*3/uL (ref 150–400)
RBC: 4.57 MIL/uL (ref 3.87–5.11)
RDW: 13.1 % (ref 11.5–15.5)
WBC: 12.7 10*3/uL — ABNORMAL HIGH (ref 4.0–10.5)
nRBC: 0 % (ref 0.0–0.2)

## 2019-01-23 LAB — TROPONIN I: Troponin I: <0.03 ng/mL (ref <0.03)

## 2019-01-23 LAB — INFLUENZA PANEL BY PCR (TYPE A & B)
Influenza A By PCR: NEGATIVE
Influenza B By PCR: NEGATIVE

## 2019-01-23 LAB — LACTIC ACID, PLASMA
Lactic Acid, Venous: 1.3 mmol/L (ref 0.5–1.9)
Lactic Acid, Venous: 1 mmol/L (ref 0.5–1.9)

## 2019-01-23 LAB — MAGNESIUM: Magnesium: 1.8 mg/dL (ref 1.7–2.4)

## 2019-01-23 MED ORDER — LISINOPRIL 10 MG PO TABS
10.0000 mg | ORAL_TABLET | Freq: Every day | ORAL | Status: DC
  Administered 2019-01-24: 10 mg via ORAL
  Filled 2019-01-23: qty 1

## 2019-01-23 MED ORDER — SODIUM CHLORIDE 0.9 % IV SOLN
INTRAVENOUS | Status: DC
  Administered 2019-01-23 – 2019-01-24 (×2): via INTRAVENOUS

## 2019-01-23 MED ORDER — MELOXICAM 15 MG PO TABS
15.0000 mg | ORAL_TABLET | Freq: Every day | ORAL | Status: DC
  Administered 2019-01-24: 15 mg via ORAL
  Filled 2019-01-23: qty 1

## 2019-01-23 MED ORDER — CALCIUM CARBONATE-VITAMIN D 500-200 MG-UNIT PO TABS
2.0000 | ORAL_TABLET | Freq: Every morning | ORAL | Status: DC
  Administered 2019-01-24: 2 via ORAL
  Filled 2019-01-23: qty 2

## 2019-01-23 MED ORDER — ACETAMINOPHEN 325 MG PO TABS: 650.0000 mg | ORAL_TABLET | Freq: Four times a day (QID) | ORAL | Status: DC | PRN

## 2019-01-23 MED ORDER — LACTATED RINGERS IV BOLUS
1000.0000 mL | Freq: Once | INTRAVENOUS | Status: AC
  Administered 2019-01-23: 1000 mL via INTRAVENOUS

## 2019-01-23 MED ORDER — SODIUM CHLORIDE 0.9 % IV SOLN
2.0000 g | Freq: Once | INTRAVENOUS | Status: AC
  Administered 2019-01-23: 2 g via INTRAVENOUS
  Filled 2019-01-23: qty 2

## 2019-01-23 MED ORDER — MELATONIN 3 MG PO TABS
3.0000 mg | ORAL_TABLET | Freq: Every day | ORAL | Status: DC
  Administered 2019-01-23: 3 mg via ORAL
  Filled 2019-01-23: qty 1

## 2019-01-23 MED ORDER — ACETAMINOPHEN 650 MG RE SUPP: 650.0000 mg | Freq: Four times a day (QID) | RECTAL | Status: DC | PRN

## 2019-01-23 MED ORDER — SIMVASTATIN 20 MG PO TABS
20.0000 mg | ORAL_TABLET | Freq: Every day | ORAL | Status: DC
  Administered 2019-01-23: 20 mg via ORAL
  Filled 2019-01-23: qty 1

## 2019-01-23 MED ORDER — ENOXAPARIN SODIUM 30 MG/0.3ML ~~LOC~~ SOLN
30.0000 mg | SUBCUTANEOUS | Status: DC
  Administered 2019-01-23: 30 mg via SUBCUTANEOUS
  Filled 2019-01-23 (×2): qty 0.3

## 2019-01-23 MED ORDER — METOPROLOL TARTRATE 25 MG PO TABS
12.5000 mg | ORAL_TABLET | Freq: Two times a day (BID) | ORAL | Status: DC
  Administered 2019-01-23: 12.5 mg via ORAL
  Filled 2019-01-23: qty 1

## 2019-01-23 MED ORDER — ASPIRIN EC 81 MG PO TBEC
81.0000 mg | DELAYED_RELEASE_TABLET | Freq: Every day | ORAL | Status: DC
  Administered 2019-01-24: 81 mg via ORAL
  Filled 2019-01-23: qty 1

## 2019-01-23 MED ORDER — VANCOMYCIN HCL 10 G IV SOLR
1500.0000 mg | Freq: Once | INTRAVENOUS | Status: AC
  Administered 2019-01-23: 1500 mg via INTRAVENOUS
  Filled 2019-01-23: qty 1500

## 2019-01-23 NOTE — Progress Notes (Signed)
ED TO INPATIENT HANDOFF REPORT  ED Nurse Name and Phone #: Valetta Mole Name/Age/Gender Vanetta Shawl Duthie 85 y.o. female Room/Bed: RESA/RESA  Code Status   Code Status: Full Code  Home/SNF/Other Home A&O x 4 Is this baseline? Yes   Triage Complete: Triage complete  Chief Complaint Dehydration  Triage Note The patient is from [place] and presents after an episode of syncope lasting 2 seconds and diarrhea. When EMS arrived her initial BP was 88/55 but increased to 188/70 after 250 ml bolus was given. BP then dropped back to 80/50 again. More fluid was given. EMS reports a run of torsades. She is A&O x 4.   EMS vitals and CBG: 108/56 BP 90 HR  14 Resp rate 136 CBG 94% O2 sats on room air    Allergies Allergies  Allergen Reactions  . Sulfa Antibiotics Hives, Diarrhea, Nausea And Vomiting and Swelling    Facial swelling ALSO  . Latex Rash    Level of Care/Admitting Diagnosis ED Disposition    ED Disposition Condition Comment   Admit  Hospital Area: [facility] [[id number]]  Level of Care: Telemetry [5]  Admit to tele based on following criteria: Monitor QTC interval  Admit to tele based on following criteria: Complex arrhythmia (Bradycardia/Tachycardia)  Admit to tele based on following criteria: Eval of Syncope  Diagnosis: Syncope and collapse [780.2.ICD-9-CM]  Admitting Physician: Fran Lowes [phone]  Attending Physician: Gerri Lins, AVA [[id number]  PT Class (Do Not Modify): Observation [104]  PT Acc Code (Do Not Modify): Observation [10022]       B Medical/Surgery History Past Medical History:  Diagnosis Date  . Anemia, iron deficiency 12/12/2011  . Diabetes mellitus    no mes, diet only   . Hyperlipemia   . Osteoporosis    Past Surgical History:  Procedure Laterality Date  . left knee arthroscopy    . ORIF HUMERUS FRACTURE  12/09/2011   Procedure: OPEN REDUCTION INTERNAL FIXATION (ORIF) DISTAL HUMERUS FRACTURE;  Surgeon: Sharma Covert, MD;   Location: [facility];  Service: Orthopedics;  Laterality: Right;  . OTHER SURGICAL HISTORY     left small toe bone spur removed   . TONSILLECTOMY       A IV Location/Drains/Wounds Patient Lines/Drains/Airways Status   Active Line/Drains/Airways    Name:   Placement date:   Placement time:   Site:   Days:   Peripheral IV 01/23/19 Right Hand   01/23/19    1249    Hand   less than 1   Peripheral IV 01/23/19 Right Antecubital   01/23/19    1321    Antecubital   less than 1   Incision 12/09/11 Hand Right   12/09/11    1744     2602          Intake/Output Last 24 hours  Intake/Output Summary (Last 24 hours) at 01/23/2019 1847 Last data filed at 01/23/2019 1757 Gross per 24 hour  Intake 1539.68 ml  Output -  Net 1539.68 ml    Labs/Imaging Results for orders placed or performed during the hospital encounter of 01/23/19 (from the past 48 hour(s))  Basic metabolic panel     Status: Abnormal   Collection Time: 01/23/19  1:00 PM  Result Value Ref Range   Sodium 140 135 - 145 mmol/L   Potassium 3.7 3.5 - 5.1 mmol/L   Chloride 103 98 - 111 mmol/L   CO2 25 22 - 32 mmol/L   Glucose, Bld 100 (  H) 70 - 99 mg/dL   BUN 23 8 - 23 mg/dL   Creatinine, Ser 1.91 (H) 0.44 - 1.00 mg/dL   Calcium 9.1 8.9 - 47.8 mg/dL   GFR calc non Af Amer 31 (L) >60 mL/min   GFR calc Af Amer 36 (L) >60 mL/min   Anion gap 12 5 - 15    Comment: Performed at [facility], [address] [place], [place] [address]  Magnesium     Status: None   Collection Time: 01/23/19  1:00 PM  Result Value Ref Range   Magnesium 1.8 1.7 - 2.4 mg/dL    Comment: Performed at [facility], [address] [place], [place] [address]  Troponin I -     Status: None   Collection Time: 01/23/19  1:00 PM  Result Value Ref Range   Troponin I <0.03 <0.03 ng/mL    Comment: Performed at [facility], [address] [place] [address], [place], [place] [address]  Blood culture (routine x 2)     Status: None  (Preliminary result)   Collection Time: 01/23/19  1:20 PM  Result Value Ref Range   Specimen Description      BLOOD RIGHT ANTECUBITAL Performed at [facility], [address] [place], [place] [address]    Special Requests      BOTTLES DRAWN AEROBIC AND ANAEROBIC Blood Culture adequate volume Performed at [facility], [address], [place], [place] [address]    Culture PENDING    Report Status PENDING   Influenza panel by PCR (type A & B)     Status: None   Collection Time: 01/23/19  1:20 PM  Result Value Ref Range   Influenza A By PCR NEGATIVE NEGATIVE   Influenza B By PCR NEGATIVE NEGATIVE    Comment: (NOTE) The Xpert Xpress Flu assay is intended as an aid in the diagnosis of  influenza and should not be used as a sole basis for treatment.  This  assay is FDA approved for nasopharyngeal swab specimens only. Nasal  washings and aspirates are unacceptable for Xpert Xpress Flu testing. Performed at [facility], [address] [place], [place] [address]   Lactic acid, plasma     Status: None   Collection Time: 01/23/19  2:05 PM  Result Value Ref Range   Lactic Acid, Venous 1.3 0.5 - 1.9 mmol/L    Comment: Performed at [facility], [address] [place], [place] [address]  CBC with Differential/Platelet     Status: Abnormal   Collection Time: 01/23/19  2:05 PM  Result Value Ref Range   WBC 12.7 (H) 4.0 - 10.5 K/uL   RBC 4.57 3.87 - 5.11 MIL/uL   Hemoglobin 13.3 12.0 - 15.0 g/dL   HCT 36.6 44.0 - 34.7 %   MCV 95.2 80.0 - 100.0 fL   MCH 29.1 26.0 - 34.0 pg   MCHC 30.6 30.0 - 36.0 g/dL   RDW 42.5 95.6 - 38.7 %   Platelets 236 150 - 400 K/uL   nRBC 0.0 0.0 - 0.2 %   Neutrophils Relative % 80 %   Neutro Abs 10.2 (H) 1.7 - 7.7 K/uL   Lymphocytes Relative 12 %   Lymphs Abs 1.5 0.7 - 4.0 K/uL   Monocytes Relative 5 %   Monocytes Absolute 0.7 0.1 - 1.0 K/uL   Eosinophils Relative 1 %   Eosinophils Absolute  0.1 0.0 - 0.5 K/uL  Basophils Relative 1 %   Basophils Absolute 0.1 0.0 - 0.1 K/uL   Immature Granulocytes 1 %   Abs Immature Granulocytes 0.07 0.00 - 0.07 K/uL    Comment: Performed at [facility], [address] [place], [place] [address]   Dg Chest Portable 1 View  Result Date: 01/23/2019 CLINICAL DATA:  85 y/o  F; syncopal episode. EXAM: PORTABLE CHEST 1 VIEW COMPARISON:  12/30/2016 chest radiograph FINDINGS: Normal cardiac silhouette. Aortic atherosclerosis with calcification. Left mid lung and lower lung zone peripheral linear opacities. Focal consolidation. No pleural effusion or pneumothorax. No acute osseous abnormality is evident. Chronic fracture deformity of right proximal humerus. IMPRESSION: No acute pulmonary process identified. Left mid and lower lung zone minor atelectasis. Aortic Atherosclerosis (ICD10-I70.0). Electronically Signed   By: Mitzi Hansen M.D.   On: 01/23/2019 14:06    Pending Labs Unresulted Labs (From admission, onward)    Start     Ordered   01/30/19 0500  Creatinine, serum  (enoxaparin (LOVENOX)    CrCl >/= 30 ml/min)  Weekly,   R    Comments:  while on enoxaparin therapy    01/23/19 1807   01/24/19 0500  Basic metabolic panel  Tomorrow morning,   R     01/23/19 1807   01/24/19 0500  CBC  Tomorrow morning,   R     01/23/19 1807   01/23/19 1804  CBC  (enoxaparin (LOVENOX)    CrCl >/= 30 ml/min)  Once,   R    Comments:  Baseline for enoxaparin therapy IF NOT ALREADY DRAWN.  Notify MD if PLT < 100 K.    01/23/19 1807   01/23/19 1804  Creatinine, serum  (enoxaparin (LOVENOX)    CrCl >/= 30 ml/min)  Once,   R    Comments:  Baseline for enoxaparin therapy IF NOT ALREADY DRAWN.    01/23/19 1807   01/23/19 1226  CBC with Differential  ONCE - STAT,   STAT     01/23/19 1225   01/23/19 1226  Urinalysis, Routine w reflex microscopic  Once,   R     01/23/19 1225   01/23/19 1226  Lactic acid, plasma  Now then every 2 hours,    STAT     01/23/19 1225   01/23/19 1226  Blood culture (routine x 2)  BLOOD CULTURE X 2,   STAT     01/23/19 1225          Vitals/Pain Today's Vitals   01/23/19 1335 01/23/19 1756 01/23/19 1800 01/23/19 1830  BP:  (!) 155/55 (!) 143/55 (!) 124/52  Pulse:  74 73 78  Resp:  11 13 15   Temp:      TempSrc:      SpO2:  96% 97% 94%  Weight:      Height:      PainSc: 0-No pain       Isolation Precautions Droplet precaution  Medications Medications  enoxaparin (LOVENOX) injection 30 mg (has no administration in time range)  0.9 %  sodium chloride infusion (has no administration in time range)  acetaminophen (TYLENOL) tablet 650 mg (has no administration in time range)    Or  acetaminophen (TYLENOL) suppository 650 mg (has no administration in time range)  aspirin EC tablet 81 mg (has no administration in time range)  meloxicam (MOBIC) tablet 15 mg (has no administration in time range)  lisinopril (PRINIVIL,ZESTRIL) tablet 10 mg (has no administration in time range)  metoprolol tartrate (LOPRESSOR) tablet 12.5  mg (has no administration in time range)  simvastatin (ZOCOR) tablet 20 mg (has no administration in time range)  Melatonin TABS 3 mg (has no administration in time range)  Calcium Carbonate-Vitamin D3 600-400 MG-UNIT TABS 2 tablet (has no administration in time range)  lactated ringers bolus 1,000 mL (0 mLs Intravenous Stopped 01/23/19 1757)  vancomycin (VANCOCIN) 1,500 mg in sodium chloride 0.9 % 500 mL IVPB (0 mg Intravenous Stopped 01/23/19 1557)  ceFEPIme (MAXIPIME) 2 g in sodium chloride 0.9 % 100 mL IVPB (0 g Intravenous Stopped 01/23/19 1353)    Mobility walks High fall risk   Focused Assessments See chart   R Recommendations: See Admitting Provider Note  Report given to:   Additional Notes: none

## 2019-01-23 NOTE — ED Notes (Signed)
Report attempted. Receiving RN will call back.

## 2019-01-23 NOTE — ED Triage Notes (Signed)
The patient is from [place] and presents after an episode of syncope lasting 2 seconds and diarrhea. When EMS arrived her initial BP was 88/55 but increased to 188/70 after 250 ml bolus was given. BP then dropped back to 80/50 again. More fluid was given. EMS reports a run of torsades. She is A&O x 4.   EMS vitals and CBG: 108/56 BP 90 HR  14 Resp rate 136 CBG 94% O2 sats on room air

## 2019-01-23 NOTE — ED Provider Notes (Signed)
[facility]-EMERGENCY DEPT Provider Note   CSN: [id number] Arrival date & time: 01/23/19  1211    History   Chief Complaint No chief complaint on file.   HPI [name] is a 85 y.o. female.     HPI   85 year old female presenting with syncope.  Apparently had a brief loss of consciousness with a quick return to baseline.  Patient cannot remember what exactly happened.  She does report recent diarrhea.  Currently she has no complaints.  Denies any acute pain.  Does not feel short of breath.  Does not feel like she has a fever.  On arrival her initial blood pressure was 80s over 50s for EMS.  Initially responded to a small fluid bolus and then became hypotensive again.  EMS reports a possible run of torsades as well.  Past Medical History:  Diagnosis Date  . Anemia, iron deficiency 12/12/2011  . Diabetes mellitus    no mes, diet only   . Hyperlipemia   . Osteoporosis     Patient Active Problem List   Diagnosis Date Noted  . Mild cognitive impairment 07/15/2015  . Acute confusional state 06/19/2015  . Memory loss 06/19/2015  . Diabetes mellitus 05/14/2015  . Hyperlipidemia 05/14/2015  . Borderline diabetes mellitus   . Essential hypertension, benign   . TIA (transient ischemic attack) 05/13/2015  . Anemia, iron deficiency 12/12/2011  . Humerus fracture 12/11/2011  . Hypoxia 12/11/2011  . Wide-complex tachycardia (HCC) 12/11/2011  . Hypotension 12/11/2011    Past Surgical History:  Procedure Laterality Date  . left knee arthroscopy    . ORIF HUMERUS FRACTURE  12/09/2011   Procedure: OPEN REDUCTION INTERNAL FIXATION (ORIF) DISTAL HUMERUS FRACTURE;  Surgeon: Sharma Covert, MD;  Location: [facility];  Service: Orthopedics;  Laterality: Right;  . OTHER SURGICAL HISTORY     left small toe bone spur removed   . TONSILLECTOMY       OB History   No obstetric history on file.      Home Medications    Prior to Admission medications   Medication  Sig Start Date End Date Taking? Authorizing Provider  acetaminophen (MAPAP) 500 MG tablet Take 500 mg by mouth 2 (two) times daily as needed for headache.    [provider]  aspirin EC 81 MG EC tablet Take 1 tablet (81 mg total) by mouth daily. 05/15/15   Joseph Art, DO  Calcium Carbonate-Vitamin D3 (CALCIUM 600-D) 600-400 MG-UNIT TABS Take 2 tablets by mouth every morning.    [provider]  docusate sodium (COLACE) 100 MG capsule Take 1 capsule (100 mg total) by mouth every 12 (twelve) hours. 12/27/16   Ward, Layla Maw, DO  donepezil (ARICEPT) 10 MG tablet Take 1 tablet (10 mg total) by mouth at bedtime. Patient not taking: Reported on 12/30/2016 02/04/16   Van Clines, MD  lisinopril (PRINIVIL,ZESTRIL) 10 MG tablet Take 10 mg by mouth daily.    [provider]  Melatonin 3 MG TABS Take 3 mg by mouth at bedtime.    [provider]  meloxicam (MOBIC) 15 MG tablet Take 15 mg by mouth daily.    [provider]  memantine (NAMENDA) 10 MG tablet Take 10 mg by mouth 2 (two) times daily.    [provider]  metoprolol tartrate (LOPRESSOR) 25 MG tablet Take 0.5 tablets (12.5 mg total) by mouth 2 (two) times daily. Patient taking differently: Take 25 mg by mouth 2 (two) times  daily.  05/15/15   Joseph Art, DO  oxyCODONE-acetaminophen (PERCOCET/ROXICET) 5-325 MG tablet Take 1 tablet by mouth every 6 (six) hours as needed. Patient not taking: Reported on 12/26/2018 12/27/16   Ward, Layla Maw, DO  sertraline (ZOLOFT) 100 MG tablet Take 100 mg by mouth at bedtime.    [provider]  simvastatin (ZOCOR) 20 MG tablet Take 1 tablet (20 mg total) by mouth daily at 6 PM. Patient taking differently: Take 20 mg by mouth at bedtime.  05/15/15   Joseph Art, DO  vitamin B-12 1000 MCG tablet Take 1 tablet (1,000 mcg total) by mouth daily. Patient taking differently: Take 1,000 mcg by mouth 2 (two) times daily.  05/15/15   Joseph Art, DO     Family History No family history on file.  Social History Social History   Tobacco Use  . Smoking status: Former Smoker    Types: Cigarettes    Last attempt to quit: 12/29/2009    Years since quitting: 9.0  . Smokeless tobacco: Never Used  Substance Use Topics  . Alcohol use: No    Alcohol/week: 0.0 standard drinks  . Drug use: No     Allergies   Sulfa antibiotics and Latex   Review of Systems Review of Systems  All systems reviewed and negative, other than as noted in HPI.  Physical Exam Updated Vital Signs BP (!) 63/44   Pulse 65   SpO2 92%   Physical Exam Vitals signs and nursing note reviewed.  Constitutional:      General: She is not in acute distress.    Appearance: She is well-developed.  HENT:     Head: Normocephalic and atraumatic.  Eyes:     General:        Right eye: No discharge.        Left eye: No discharge.     Conjunctiva/sclera: Conjunctivae normal.  Neck:     Musculoskeletal: Neck supple.  Cardiovascular:     Rate and Rhythm: Normal rate and regular rhythm.     Heart sounds: Normal heart sounds. No murmur. No friction rub. No gallop.   Pulmonary:     Effort: Pulmonary effort is normal. No respiratory distress.     Breath sounds: Normal breath sounds.  Abdominal:     General: There is no distension.     Palpations: Abdomen is soft.     Tenderness: There is no abdominal tenderness.  Musculoskeletal:        General: No tenderness.  Skin:    General: Skin is warm and dry.  Neurological:     Mental Status: She is alert.  Psychiatric:        Behavior: Behavior normal.        Thought Content: Thought content normal.      ED Treatments / Results  Labs (all labs ordered are listed, but only abnormal results are displayed) Labs Reviewed  URINALYSIS, ROUTINE W REFLEX MICROSCOPIC - Abnormal; Notable for the following components:      Result Value   APPearance HAZY (*)    Hgb urine dipstick SMALL (*)    Ketones, ur 20 (*)     Nitrite POSITIVE (*)    Leukocytes,Ua SMALL (*)    Bacteria, UA MANY (*)    All other components within normal limits  BASIC METABOLIC PANEL - Abnormal; Notable for the following components:   Glucose, Bld 100 (*)    Creatinine, Ser 1.53 (*)    GFR calc non  Af Amer 31 (*)    GFR calc Af Amer 36 (*)    All other components within normal limits  CBC WITH DIFFERENTIAL/PLATELET - Abnormal; Notable for the following components:   WBC 12.7 (*)    Neutro Abs 10.2 (*)    All other components within normal limits  BASIC METABOLIC PANEL - Abnormal; Notable for the following components:   Glucose, Bld 107 (*)    BUN 26 (*)    Creatinine, Ser 1.33 (*)    Calcium 8.4 (*)    GFR calc non Af Amer 37 (*)    GFR calc Af Amer 42 (*)    All other components within normal limits  CBC - Abnormal; Notable for the following components:   RBC 3.74 (*)    Hemoglobin 10.9 (*)    HCT 35.4 (*)    All other components within normal limits  CULTURE, BLOOD (ROUTINE X 2)  MRSA PCR SCREENING  CULTURE, BLOOD (ROUTINE X 2)  LACTIC ACID, PLASMA  INFLUENZA PANEL BY PCR (TYPE A & B)  MAGNESIUM  TROPONIN I  LACTIC ACID, PLASMA  CBC WITH DIFFERENTIAL/PLATELET    EKG EKG Interpretation  Date/Time:  Wednesday January 23 2019 13:32:23 EST Ventricular Rate:  69 PR Interval:    QRS Duration: 85 QT Interval:  487 QTC Calculation: 522 R Axis:   40 Text Interpretation:  Sinus rhythm Low voltage, precordial leads Borderline T abnormalities, anterior leads Prolonged QT interval Confirmed by Raeford Razor [id number]) on 01/23/2019 1:52:17 PM   Radiology No results found.  Procedures Procedures (including critical care time)  CRITICAL CARE Performed by: Raeford Razor Total critical care time: 35 minutes Critical care time was exclusive of separately billable procedures and treating other patients. Critical care was necessary to treat or prevent imminent or life-threatening deterioration. Critical care was time  spent personally by me on the following activities: development of treatment plan with patient and/or surrogate as well as nursing, discussions with consultants, evaluation of patient's response to treatment, examination of patient, obtaining history from patient or surrogate, ordering and performing treatments and interventions, ordering and review of laboratory studies, ordering and review of radiographic studies, pulse oximetry and re-evaluation of patient's condition.   Medications Ordered in ED Medications  vancomycin (VANCOCIN) 1,500 mg in sodium chloride 0.9 % 500 mL IVPB (1,500 mg Intravenous New Bag/Given 01/23/19 1357)  lactated ringers bolus 1,000 mL (1,000 mLs Intravenous New Bag/Given 01/23/19 1249)  ceFEPIme (MAXIPIME) 2 g in sodium chloride 0.9 % 100 mL IVPB (2 g Intravenous New Bag/Given 01/23/19 1323)     Initial Impression / Assessment and Plan / ED Course  I have reviewed the triage vital signs and the nursing notes.  Pertinent labs & imaging results that were available during my care of the patient were reviewed by me and considered in my medical decision making (see chart for details).  85 year old female with a syncopal event.  Likely from her low blood pressure.  Secondary to volume loss/diarrhea? She says her diarrhea really hasn't been that much though.  Despite approximately 500 cc of normal saline prior to arrival she was still very hypotensive on arrival to the emergency room.  She received additional IV fluids which she has responded to.  She also received empiric antibiotics for possible sepsis although there is not a clear clinical picture for this at this time.  She does have a mild leukocytosis, but this is nonspecific. UA still pending. I reviewed pre-hospital tracing in question. Only a  couple seconds of bizarre morphology as recording was beginning. I suspect probably simply artifact. Pt alert/talking during this episode per EMS. Her electrolytes are not significantly  abnormal. No concerning dysrhythmia on monitor here. Can be monitored further on telemetry.   Final Clinical Impressions(s) / ED Diagnoses   Final diagnoses:  Hypotension, unspecified hypotension type  Urinary tract infection without hematuria, site unspecified  Syncope, unspecified syncope type    ED Discharge Orders    None       Raeford Razor, MD 01/24/19 [phone]

## 2019-01-23 NOTE — Plan of Care (Signed)
Plan of care discussed.   

## 2019-01-23 NOTE — H&P (Addendum)
[name] is an 85 y.o. female.   Chief Complaint: Syncope with brief loss of consciousness accompanied by diarrhea as per nursing home. HPI: The patient is a 85 yr old resident of a nursing home. The patient was sent to the ED via EMS due to what they described as an episode of diarrhea and syncope.  Patient does not recall syncope, but does complain of diarrhea and emesis. The nursing home does not mention emesis.   The patient carries a past medical history significant for Iron deficiency anemia, DM II that is diet controlled, hyperlipidemia, osteoporosis, mild cognitive impairment, essential hypertension, TIA, wide complex tachycardia, and hypotension.  The patient denies fevers, chills, feeling weak or faint. No hematemesis, melena, or hematochezia. She denies any abdominal pain or sick contacts.   Once in the ED the patient had a temperature of 98.8, respiratory rate of 16, heart rate of 75, and blood pressure of 106/51. She was given IV fluid resuscitation and had resolution of hypotension.  WBC was 12.7, hemoglobin was 13.3, hematocrit was 43.5. Platelets were 236. Sodiium was 140, Potassium was 3.7. Chloride was 103. CO2 was 25. BUN was 23 and creatinine was 1.53. This appears to be baseline. Magnesium was 1.8. Lactic acid was 1.3. Flu screen was negative. Blood culture x 2 was obtained.  CXR demonstrated no acute pulmonary process. There was minor atelectasis. EKG was obtained. It demonstrated a prolonged QT interval and bordeline T wave abnormalities in the anterior leads. EKG and CXR were viewed and interpreted personally by me.  The hospitalists were consulted to admit the patient for further evaluation and treatment.  Past Medical History:  Diagnosis Date  . Anemia, iron deficiency 12/12/2011  . Diabetes mellitus    no mes, diet only   . Hyperlipemia   . Osteoporosis     Past Surgical History:  Procedure Laterality Date  . left knee arthroscopy    . ORIF HUMERUS  FRACTURE  12/09/2011   Procedure: OPEN REDUCTION INTERNAL FIXATION (ORIF) DISTAL HUMERUS FRACTURE;  Surgeon: Sharma Covert, MD;  Location: [facility];  Service: Orthopedics;  Laterality: Right;  . OTHER SURGICAL HISTORY     left small toe bone spur removed   . TONSILLECTOMY      History reviewed. No pertinent family history. Social History:  reports that she quit smoking about 9 years ago. Her smoking use included cigarettes. She has never used smokeless tobacco. She reports that she does not drink alcohol or use drugs. (Not in a hospital admission)   Allergies:  Allergies  Allergen Reactions  . Sulfa Antibiotics Hives, Diarrhea, Nausea And Vomiting and Swelling    Facial swelling ALSO  . Latex Rash    Pertinent items are noted in HPI.   General appearance: cooperative, moderately obese and elderly, weak, and frail Head: Normocephalic, without obvious abnormality, atraumatic Eyes: conjunctivae/corneas clear. PERRL, EOM's intact. Fundi benign. Throat: lips, mucosa, and tongue normal; teeth and gums normal Neck: no adenopathy, no carotid bruit, no JVD, supple, symmetrical, trachea midline and thyroid not enlarged, symmetric, no tenderness/mass/nodules Resp: No increased work of breathing. No wheezes, rales, or rhonchi. No tactile fremitus. Chest wall: no tenderness Cardio: regular rate and rhythm, S1, S2 normal, no murmur, click, rub or gallop GI: The abdomen is obese. It is slightly distended, but non-tender. It is tympanic to percussion. Bowel sounds are hypoactive. I am unable to evaluate the abdomen for hernias, masses, or organomegaly. Extremities: edema 2+ pitting edema is present. No cyanosis or  clubbing. Pulses: Diminished distal pulses. Skin: Skin color, texture, turgor normal. No rashes or lesions Lymph nodes: Cervical, supraclavicular, and axillary nodes normal. Neurologic: Alert and oriented X 3, normal strength and tone. Normal symmetric reflexes. Normal coordination and  gait   Results for orders placed or performed during the hospital encounter of 01/23/19 (from the past 48 hour(s))  Basic metabolic panel     Status: Abnormal   Collection Time: 01/23/19  1:00 PM  Result Value Ref Range   Sodium 140 135 - 145 mmol/L   Potassium 3.7 3.5 - 5.1 mmol/L   Chloride 103 98 - 111 mmol/L   CO2 25 22 - 32 mmol/L   Glucose, Bld 100 (H) 70 - 99 mg/dL   BUN 23 8 - 23 mg/dL   Creatinine, Ser 2.87 (H) 0.44 - 1.00 mg/dL   Calcium 9.1 8.9 - 86.7 mg/dL   GFR calc non Af Amer 31 (L) >60 mL/min   GFR calc Af Amer 36 (L) >60 mL/min   Anion gap 12 5 - 15    Comment: Performed at [facility], [address], [place], [place] [address]  Magnesium     Status: None   Collection Time: 01/23/19  1:00 PM  Result Value Ref Range   Magnesium 1.8 1.7 - 2.4 mg/dL    Comment: Performed at [facility], [address] [place], [place] [address]  Troponin I -     Status: None   Collection Time: 01/23/19  1:00 PM  Result Value Ref Range   Troponin I <0.03 <0.03 ng/mL    Comment: Performed at [facility], [address], [place], [place] [address]  Blood culture (routine x 2)     Status: None (Preliminary result)   Collection Time: 01/23/19  1:20 PM  Result Value Ref Range   Specimen Description      BLOOD RIGHT ANTECUBITAL Performed at [facility], [address] [place], [place] [address]    Special Requests      BOTTLES DRAWN AEROBIC AND ANAEROBIC Blood Culture adequate volume Performed at [facility], [address], [place], [place] [address]    Culture PENDING    Report Status PENDING   Influenza panel by PCR (type A & B)     Status: None   Collection Time: 01/23/19  1:20 PM  Result Value Ref Range   Influenza A By PCR NEGATIVE NEGATIVE   Influenza B By PCR NEGATIVE NEGATIVE    Comment: (NOTE) The Xpert Xpress Flu assay is intended as an aid in the diagnosis of  influenza and  should not be used as a sole basis for treatment.  This  assay is FDA approved for nasopharyngeal swab specimens only. Nasal  washings and aspirates are unacceptable for Xpert Xpress Flu testing. Performed at [facility], [address], [place], [place] [address]   Lactic acid, plasma     Status: None   Collection Time: 01/23/19  2:05 PM  Result Value Ref Range   Lactic Acid, Venous 1.3 0.5 - 1.9 mmol/L    Comment: Performed at [facility], [address] [place], [place] [address]  CBC with Differential/Platelet     Status: Abnormal   Collection Time: 01/23/19  2:05 PM  Result Value Ref Range   WBC 12.7 (H) 4.0 - 10.5 K/uL   RBC 4.57 3.87 - 5.11 MIL/uL   Hemoglobin 13.3 12.0 - 15.0 g/dL  HCT 43.5 36.0 - 46.0 %   MCV 95.2 80.0 - 100.0 fL   MCH 29.1 26.0 - 34.0 pg   MCHC 30.6 30.0 - 36.0 g/dL   RDW 35.6 86.1 - 68.3 %   Platelets 236 150 - 400 K/uL   nRBC 0.0 0.0 - 0.2 %   Neutrophils Relative % 80 %   Neutro Abs 10.2 (H) 1.7 - 7.7 K/uL   Lymphocytes Relative 12 %   Lymphs Abs 1.5 0.7 - 4.0 K/uL   Monocytes Relative 5 %   Monocytes Absolute 0.7 0.1 - 1.0 K/uL   Eosinophils Relative 1 %   Eosinophils Absolute 0.1 0.0 - 0.5 K/uL   Basophils Relative 1 %   Basophils Absolute 0.1 0.0 - 0.1 K/uL   Immature Granulocytes 1 %   Abs Immature Granulocytes 0.07 0.00 - 0.07 K/uL    Comment: Performed at [facility], [address] [place], [place] [address]   @[email or web address] @  Blood pressure (!) 106/51, pulse 61, temperature 98.8 F (37.1 C), temperature source Rectal, height 5\' 3"  (1.6 m), weight 77.1 kg, SpO2 95 %.    Assessment/Plan  Problem  Syncope and Collapse  Leukocytosis  Diarrhea  Prolonged Qt Interval  DM II (diabetes mellitus, type II), controlled (HCC)  Borderline Diabetes Mellitus  Acute Hypotension   The patient will be admitted to a telemetry bed due to history of wide complex tachycardia and  prolonged QT pressent on today's EKG. Blood cultures x 2 have been obtained. She will receive IV fluids. Orthostatic vitals will be obtained in the am. She will be ruled out for MI by EKG and enzyme criteria and TTE will be obtained. Stool will be sent for C diff if she has further episodes of diarrhea. She will be continued on her home medications as possible. The patient's aricept, namenda and zoloft will be held as they may contribute to the QT prolongation. The patient was receiving colace twice a day scheduled, which may be a good reason for her diarrhea and hypotension. Another important factor in her hypotension is that her the med rec states that her metoprolol was prescribed as 12.5 bid, but she is taking 25 bid. These issues will need to be sorted out before she can be discharged. She will be placed on a carbohydrate controlled diet.   I have seen and examined this patient myself. I have spent 72 minutes on her evaluation and admission. EKG and CXR were viewed and interpreted by me. I have conducted a thorough review of the patient's available medical record.  Jaela Yepez 01/23/2019, 5:35 PM

## 2019-01-23 NOTE — ED Notes (Signed)
Bed: RESA Expected date:  Expected time:  Means of arrival:  Comments: EMS/syncope.

## 2019-01-24 ENCOUNTER — Observation Stay (HOSPITAL_BASED_OUTPATIENT_CLINIC_OR_DEPARTMENT_OTHER): Payer: Medicare Other

## 2019-01-24 ENCOUNTER — Encounter (HOSPITAL_COMMUNITY): Payer: Self-pay | Admitting: Student

## 2019-01-24 DIAGNOSIS — N390 Urinary tract infection, site not specified: Secondary | ICD-10-CM | POA: Diagnosis not present

## 2019-01-24 DIAGNOSIS — D72829 Elevated white blood cell count, unspecified: Secondary | ICD-10-CM | POA: Diagnosis not present

## 2019-01-24 DIAGNOSIS — I361 Nonrheumatic tricuspid (valve) insufficiency: Secondary | ICD-10-CM

## 2019-01-24 DIAGNOSIS — I959 Hypotension, unspecified: Secondary | ICD-10-CM | POA: Diagnosis not present

## 2019-01-24 DIAGNOSIS — E118 Type 2 diabetes mellitus with unspecified complications: Secondary | ICD-10-CM | POA: Diagnosis not present

## 2019-01-24 DIAGNOSIS — R55 Syncope and collapse: Secondary | ICD-10-CM | POA: Diagnosis not present

## 2019-01-24 LAB — BASIC METABOLIC PANEL
Anion gap: 6 (ref 5–15)
BUN: 26 mg/dL — ABNORMAL HIGH (ref 8–23)
CHLORIDE: 108 mmol/L (ref 98–111)
CO2: 25 mmol/L (ref 22–32)
Calcium: 8.4 mg/dL — ABNORMAL LOW (ref 8.9–10.3)
Creatinine, Ser: 1.33 mg/dL — ABNORMAL HIGH (ref 0.44–1.00)
GFR calc Af Amer: 42 mL/min — ABNORMAL LOW (ref >60)
GFR calc non Af Amer: 37 mL/min — ABNORMAL LOW (ref >60)
Glucose, Bld: 107 mg/dL — ABNORMAL HIGH (ref 70–99)
Potassium: 3.8 mmol/L (ref 3.5–5.1)
Sodium: 139 mmol/L (ref 135–145)

## 2019-01-24 LAB — CBC
HCT: 35.4 % — ABNORMAL LOW (ref 36.0–46.0)
Hemoglobin: 10.9 g/dL — ABNORMAL LOW (ref 12.0–15.0)
MCH: 29.1 pg (ref 26.0–34.0)
MCHC: 30.8 g/dL (ref 30.0–36.0)
MCV: 94.7 fL (ref 80.0–100.0)
Platelets: 207 10*3/uL (ref 150–400)
RBC: 3.74 MIL/uL — ABNORMAL LOW (ref 3.87–5.11)
RDW: 13.2 % (ref 11.5–15.5)
WBC: 9 10*3/uL (ref 4.0–10.5)
nRBC: 0 % (ref 0.0–0.2)

## 2019-01-24 LAB — ECHOCARDIOGRAM COMPLETE
Height: 63 in
Weight: 2720 oz

## 2019-01-24 LAB — MRSA PCR SCREENING: MRSA by PCR: NEGATIVE

## 2019-01-24 MED ORDER — CEFPODOXIME PROXETIL 200 MG PO TABS: 200.0000 mg | ORAL_TABLET | Freq: Two times a day (BID) | ORAL | 0 refills | Status: DC

## 2019-01-24 MED ORDER — OXYCODONE-ACETAMINOPHEN 5-325 MG PO TABS: 1.0000 | ORAL_TABLET | Freq: Four times a day (QID) | ORAL | 0 refills | Status: DC | PRN

## 2019-01-24 NOTE — Consult Note (Addendum)
Cardiology Consult    Patient ID: [name] MRN: [id number], DOB/AGE: 02-21-1932   Admit date: 01/23/2019 Date of Consult: 01/24/2019  Primary Physician: Lupita Raider, MD Primary Cardiologist: Previously seen by Dr. Eldridge Dace in 2013.  Requesting Provider: Pamella Pert, MD  Patient Profile    [name] is a 85 y.o. female with a history of SVT, hypertension, hyperlipidemia, type 2 diabetes mellitus, iron deficiency anemia, TIA, and mild cognitive impairment who is being seen today for the evaluation of syncope at the request of Dr. Elvera Lennox.  History of Present Illness    [name] is a 85 year old female with the above history. Patient was seen by Dr. Eldridge Dace as a consult for evaluation of tachycardia following ORIF of distal humerus fracture. Tachycardia was felt to be SVT likely related to the stress of her fracture, surgery, and anemia. Echo showed no focal wall motion abnormalities at that time. She was started on Lopressor. Patient has not seen a Cardiologist since that time.   Patient presented to the ED from nursing facility ([facility]) via EMS yesterday for evaluation of syncope. Patient does not know what brought her to the hospital and cannot remember passing out. She denies any symptoms including chest pain, shortness of breath, palpitations, lightheadedness, dizziness, orthopnea, PND, or edema. She states she thinks she had a couple of episodes of diarrhea once she got to the hospital but denies any fevers, nausea, vomiting, decrease PO intake, or recent illnesses. Per chart review, patient apparently had a brief syncopal episode lasting a couple of seconds with quick return to baseline. When EMS arrived her initial BP was 88/55 but increased to 188/70 after a 250 mL bolus was given. BP then dropped back to 80/50 again so more fluid was given. EMS also reports a possible run of Torsades but we do not have any strips from that time. The ED physician did review this  tracing and reports  "only a couple seconds of bizarre morphology as recording was beginning" that he suspects was simply artifact.   Upon arrival to the ED, vitals stable with a BP of 106/51. EKG showed normal sinus rhythm with minimal ST depression in lead V3 but no significant changes compared to prior tracings. Initial troponin negative. Chest x-[name] showed left mid and lower lung zone minor atelectasis but no acute findings. WBC 12.7, Hgb 13.3, Plts 236. Na 140, K 3.7, Mg 1.8, Glucose 100, SCr 1.53. Patient was given more IV fluids in the ED and hypotension resolved. She was admitted for further evaluation.   At the time of this evaluation, patient has no complaints. She is oriented to person, date of birth, month/year, and type of facility but could not tell me the day of the week or what city we are in. She also repeated herself a couple of times as if she could not remember what she told me. Per H&P, patient has a history of mild cognitive impair.   Patient is a former smoker but states she quit over 20 years ago. She denies any alcohol or recreational drug use. Patient does have a family history of heart disease with her mother have some type of "heart problem" (she is unsure exactly what) and her maternal grandmother dying from a heart attack. No known family history of any heart arrhythmias or strokes.   Past Medical History   Past Medical History:  Diagnosis Date  . Anemia, iron deficiency 12/12/2011  . Diabetes mellitus    no mes,  diet only   . Hyperlipemia   . Osteoporosis     Past Surgical History:  Procedure Laterality Date  . left knee arthroscopy    . ORIF HUMERUS FRACTURE  12/09/2011   Procedure: OPEN REDUCTION INTERNAL FIXATION (ORIF) DISTAL HUMERUS FRACTURE;  Surgeon: Sharma Covert, MD;  Location: [facility];  Service: Orthopedics;  Laterality: Right;  . OTHER SURGICAL HISTORY     left small toe bone spur removed   . TONSILLECTOMY       Allergies  Allergies  Allergen  Reactions  . Sulfa Antibiotics Hives, Diarrhea, Nausea And Vomiting and Swelling    Facial swelling ALSO  . Latex Rash    Inpatient Medications    . aspirin EC  81 mg Oral Daily  . calcium-vitamin D  2 tablet Oral q morning - 10a  . enoxaparin (LOVENOX) injection  30 mg Subcutaneous Q24H  . lisinopril  10 mg Oral Daily  . Melatonin  3 mg Oral QHS  . meloxicam  15 mg Oral Daily  . metoprolol tartrate  12.5 mg Oral BID  . simvastatin  20 mg Oral QHS    Family History    Family History  Problem Relation Age of Onset  . Heart Problems Mother   . Heart attack Maternal Grandmother        died from a "heart attack"  . Stroke Neg Hx   . Arrhythmia Neg Hx    She indicated that her mother is deceased. She indicated that her father is deceased. She indicated that her maternal grandmother is deceased. She indicated that the status of her neg hx is unknown.   Social History    Social History   Socioeconomic History  . Marital status: Divorced    Spouse name: Not on file  . Number of children: 2  . Years of education: Not on file  . Highest education level: Not on file  Occupational History  . Occupation: Retired  Engineer, production  . Financial resource strain: Not on file  . Food insecurity:    Worry: Not on file    Inability: Not on file  . Transportation needs:    Medical: Not on file    Non-medical: Not on file  Tobacco Use  . Smoking status: Former Smoker    Types: Cigarettes    Last attempt to quit: 12/29/2009    Years since quitting: 9.0  . Smokeless tobacco: Never Used  Substance and Sexual Activity  . Alcohol use: No    Alcohol/week: 0.0 standard drinks  . Drug use: No  . Sexual activity: Never  Lifestyle  . Physical activity:    Days per week: Not on file    Minutes per session: Not on file  . Stress: Not on file  Relationships  . Social connections:    Talks on phone: Not on file    Gets together: Not on file    Attends religious service: Not on file     Active member of club or organization: Not on file    Attends meetings of clubs or organizations: Not on file    Relationship status: Not on file  . Intimate partner violence:    Fear of current or ex partner: Not on file    Emotionally abused: Not on file    Physically abused: Not on file    Forced sexual activity: Not on file  Other Topics Concern  . Not on file  Social History Narrative  . Not on file  Review of Systems    Review of Systems  Constitutional: Negative for chills and fever.  HENT: Negative for congestion and sore throat.   Eyes: Negative for blurred vision and double vision.  Respiratory: Negative for cough and shortness of breath.   Cardiovascular: Negative for chest pain, palpitations, orthopnea, leg swelling and PND.  Gastrointestinal: Positive for diarrhea. Negative for blood in stool, nausea and vomiting.  Genitourinary: Negative for hematuria.  Musculoskeletal: Negative for falls and myalgias.  Neurological: Positive for loss of consciousness. Negative for dizziness.  Endo/Heme/Allergies: Does not bruise/bleed easily.  Psychiatric/Behavioral: Negative for substance abuse (former tobacco use).    Physical Exam    Blood pressure (!) 141/59, pulse 62, temperature 98.5 F (36.9 C), temperature source Oral, resp. rate 14, height  (1.6 m), weight 77.1 kg, SpO2 93 %.  General: 85 y.o. female resting comfortably in no acute distress. Pleasant and cooperative. HEENT: Normal  Neck: Supple. No carotid bruits or JVD appreciated. Lungs: No increased work of breathing. Minimal faint crackles in bilateral bases but lungs otherwise clear to auscultation.  Heart: Mildly bradycardic with regular rhythm. Distinct S1 and S2. No murmurs, gallops, or rubs.  Abdomen: Soft, non-distended, and non-tender to palpation. Bowel sounds present.  Extremities: No lower extremity edema. Radial pulses 2+ and equal bilaterally. Skin: Warm and dry. Neuro: Alert. Oriented to  person, date of birth, month/year, and type of facility but could not tell me day of week or city. No focal deficits. Moves all extremities spontaneously. Psych: Normal affect.  Labs    Troponin (Point of Care Test) No results for input(s): TROPIPOC in the last 72 hours. Recent Labs    01/23/19 1300  TROPONINI <0.03   Lab Results  Component Value Date   WBC 9.0 01/24/2019   HGB 10.9 (L) 01/24/2019   HCT 35.4 (L) 01/24/2019   MCV 94.7 01/24/2019   PLT 207 01/24/2019    Recent Labs  Lab 01/24/19 0359  NA 139  K 3.8  CL 108  CO2 25  BUN 26*  CREATININE 1.33*  CALCIUM 8.4*  GLUCOSE 107*   Lab Results  Component Value Date   CHOL 219 (H) 05/14/2015   HDL 56 05/14/2015   LDLCALC 151 (H) 05/14/2015   TRIG 58 05/14/2015   No results found for: [facility]   Radiology Studies    Dg Chest Portable 1 View  Result Date: 01/23/2019 CLINICAL DATA:  85 y/o  F; syncopal episode. EXAM: PORTABLE CHEST 1 VIEW COMPARISON:  12/30/2016 chest radiograph FINDINGS: Normal cardiac silhouette. Aortic atherosclerosis with calcification. Left mid lung and lower lung zone peripheral linear opacities. Focal consolidation. No pleural effusion or pneumothorax. No acute osseous abnormality is evident. Chronic fracture deformity of right proximal humerus. IMPRESSION: No acute pulmonary process identified. Left mid and lower lung zone minor atelectasis. Aortic Atherosclerosis (ICD10-I70.0). Electronically Signed   By: Mitzi Hansen M.D.   On: 01/23/2019 14:06    EKG     EKG: EKG was personally reviewed and demonstrates: Normal sinus rhythm, rate 69 bpm, with minimal ST depression in lead 3, prolonged QT (QTc 522), and non-specific ST/T changes. However, no significant changes compared to prior tracings other than QTc of 440 on tracing in 12/2016.   Telemetry: Telemetry was personally reviewed and demonstrates: Sinus rhythm with heart rates in the 50's to 70's.   Cardiac Imaging      Echocardiogram 05/14/2015: Study Conclusions: - Left ventricle: The cavity size was normal. Wall thickness was   increased  in a pattern of mild LVH. Systolic function was normal.   The estimated ejection fraction was in the range of 55% to 60%.   Wall motion was normal; there were no regional wall motion   abnormalities. Doppler parameters are consistent with abnormal   left ventricular relaxation (grade 1 diastolic dysfunction).  Assessment & Plan    Possible Syncope - Patient presents from nursing facility ([facility]) for a brief syncopal episode lasting a couple of seconds. Patient has no recollection of this and cannot tell me what brought her to the hospital. She reportedly has a history of mild cognitive impairment. Patient denies any cardiac symptoms. When EMS arrived, patient was hypotensive with systolic BP in the 80's. EMS also reports possible run of Torsades en route to the ED; however, we have no strip from this time. The ED physician did review this tracing and reports  "only a couple seconds of bizarre morphology as recording was beginning" that he suspects was simply artifact.  Patient has a history of SVT in 2013 that was felt to be likely related to the stress of a distal humerus fracture, surgery (ORIF), and anemia - EKG on arrival showed normal sinus rhythm with no acute ischemic changes. - Telemetry shows sinus rhythm with heart rates in the 50's to 70's. No evidence of any arrhythmias.  - Troponin negative x1. - Potassium 3.7 and Magnesium 1.8 on arrival.  - Echo pending.  - Will wait for Echo results. Could consider outpatient monitor to assess for possible arrhythmia given patient has a history of cognitive impairment and is unable to tell me anything about this event and given report of possible Torsades (although sounds like this was most likely artifact). However, patient's hypotension could easily account for her syncope. Hypotension resolved with IV fluids.    Addendum: Echo results back and show LVEF of 60-65%. Do not anticipate any additional inpatient work-up. Will defer decision of outpatient monitor to MD.   Signed, Corrin Parker, PA-C 01/24/2019, 10:18 AM  For questions or updates, please contact   Please consult [email or web address] for contact info under Cardiology/STEMI.

## 2019-01-24 NOTE — Care Management Obs Status (Signed)
MEDICARE OBSERVATION STATUS NOTIFICATION   Patient Details  Name: [name] MRN: [id number] Date of Birth: Jun 17, 1932   Medicare Observation Status Notification Given:  Yes    Geni Bers, RN 01/24/2019, 11:18 AM

## 2019-01-24 NOTE — Clinical Social Work Note (Signed)
Clinical Social Work Assessment  Patient Details  Name: [name] MRN: [id number] Date of Birth: [birth year]  Date of referral:  01/24/19               Reason for consult:  Discharge Planning                Permission sought to share information with:  Facility Art therapist granted to share information::  Yes, Verbal Permission Granted  Name::        Agency::  [facility]  Relationship::     Contact Information:     Housing/Transportation Living arrangements for the past 2 months:  [place] of Information:  Patient, Facility Patient Interpreter Needed:  None Criminal Activity/Legal Involvement Pertinent to Current Situation/Hospitalization:  No - Comment as needed Significant Relationships:  Adult Children, Warehouse manager, Friend, Industrial/product designer Lives with:  Facility Resident Do you feel safe going back to the place where you live?  Yes Need for family participation in patient care:  No (Coment)  Care giving concerns:  Pt admitted from [facility]. Had a syncopal episode and reports briefly losing consciousness. Admitted under observation last night. No caregiving concerns reported.   Social Worker assessment / plan:  CSW consulted to assist with disposition- pt returning to ALF where she resides today. Met with pt at bedside- she was alert and oriented, very pleasant and engaged. States she does not remember the event leading to her admission. States she updated her daughter (lives in [place]) about her status. Has a friend prepared to pick her up and return to [facility] today. CSW spoke with Horris Latino at [facility] and provided DC information.  Employment status:  Retired Forensic scientist:  Medicare PT Recommendations:  Not assessed at this time [place] / Referral to community resources:     Patient/Family's Response to care:  appreciative  Patient/Family's Understanding of and Emotional Response to  Diagnosis, Current Treatment, and Prognosis:  See above- pt does not remember arrival or details of events here but states she does understand that she had a syncopal episode and that she has been cleared to return to ALF stable. Pt emotionally very positive and happy about being discharged. Laughing and joking with CSW appropriately.  Emotional Assessment Appearance:  Appears stated age Attitude/Demeanor/Rapport:  Engaged Affect (typically observed):  Happy, Accepting, Calm Orientation:  Oriented to Self, Oriented to Place, Oriented to  Time, Oriented to Situation Alcohol / Substance use:  Not Applicable Psych involvement (Current and /or in the community):  No (Comment)  Discharge Needs  Concerns to be addressed:  Care Coordination Readmission within the last 30 days:  No Current discharge risk:  None Barriers to Discharge:  No Barriers Identified   Nila Nephew, LCSW 01/24/2019, 3:48 PM [phone]

## 2019-01-24 NOTE — Discharge Summary (Addendum)
Physician Discharge Summary  Vaani Morren Capizzi ZOX:[id number] DOB: 21-Dec-1931 DOA: 01/23/2019  PCP: Lupita Raider, MD  Admit date: 01/23/2019 Discharge date: 01/24/2019  Admitted From: ALF Disposition:  ALF  Recommendations for Outpatient Follow-up:  1. Follow up with PCP in 1-2 weeks 2. Continue Cefpodoxime for 4 additional days   Home Health: none Equipment/Devices: none  Discharge Condition: stable CODE STATUS: Full code Diet recommendation: regular  HPI: Per admitting MD, The patient is a 85 yr old resident of a nursing home. The patient was sent to the ED via EMS due to what they described as an episode of diarrhea and syncope.  Patient does not recall syncope, but does complain of diarrhea and emesis. The nursing home does not mention emesis. The patient carries a past medical history significant for Iron deficiency anemia, DM II that is diet controlled, hyperlipidemia, osteoporosis, mild cognitive impairment, essential hypertension, TIA, wide complex tachycardia, and hypotension. The patient denies fevers, chills, feeling weak or faint. No hematemesis, melena, or hematochezia. She denies any abdominal pain or sick contacts. Once in the ED the patient had a temperature of 98.8, respiratory rate of 16, heart rate of 75, and blood pressure of 106/51. She was given IV fluid resuscitation and had resolution of hypotension. WBC was 12.7, hemoglobin was 13.3, hematocrit was 43.5. Platelets were 236. Sodiium was 140, Potassium was 3.7. Chloride was 103. CO2 was 25. BUN was 23 and creatinine was 1.53. This appears to be baseline. Magnesium was 1.8. Lactic acid was 1.3. Flu screen was negative. Blood culture x 2 was obtained. CXR demonstrated no acute pulmonary process. There was minor atelectasis. EKG was obtained. It demonstrated a prolonged QT interval and bordeline T wave abnormalities in the anterior leads. EKG and CXR were viewed and interpreted personally by me. The hospitalists were consulted  to admit the patient for further evaluation and treatment.  Hospital Course: Syncope -patient was admitted to the hospital with a syncopal episode.  Given report of torsades per EMS as well as history of SVT with hypotension few years back cardiology was consulted and evaluated patient.  She underwent a 2D echo which was fairly unremarkable (see full read below) her telemetry has been stable, she was no longer hypotensive and was feeling back to baseline.  This was associated with vomiting and diarrhea and perhaps may have been related to vasovagal episode, cardiology did not feel like this was cardiac arrhythmia causing syncopal episode.  She is back to baseline, and will be discharged home in stable condition. Chronic kidney disease stage III -Baseline creatinine 1.3-1.4, at baseline on discharge UTI -possible cause for hypotension, will treat empirically for 5 days. Received Cefepime in the ED and will do Cefpodoxime for 4 additional days Leukocytosis -no evidence of infection,?  Stress demargination, resolved Hyperlipidemia-continue statin on discharge Mild dementia-continue home medications on discharge Depression-continue Zoloft on discharge Hypertension -Continue home medications on discharge  Discharge Diagnoses:  Active Problems:   Acute hypotension   DM II (diabetes mellitus, type II), controlled (HCC)   Syncope and collapse   Leukocytosis   Diarrhea   Prolonged QT interval     Discharge Instructions   Allergies as of 01/24/2019      Reactions   Sulfa Antibiotics Hives, Diarrhea, Nausea And Vomiting, Swelling   Facial swelling ALSO   Latex Rash      Medication List    TAKE these medications   aspirin 81 MG EC tablet Take 1 tablet (81 mg total) by mouth daily.  CALCIUM 600-D 600-400 MG-UNIT Tabs Generic drug:  Calcium Carbonate-Vitamin D3 Take 2 tablets by mouth every morning.   cefpodoxime 200 MG tablet Commonly known as:  VANTIN Take 1 tablet (200 mg total) by  mouth 2 (two) times daily.   cyanocobalamin 1000 MCG tablet Take 1 tablet (1,000 mcg total) by mouth daily. What changed:  when to take this   docusate sodium 100 MG capsule Commonly known as:  COLACE Take 1 capsule (100 mg total) by mouth every 12 (twelve) hours.   donepezil 10 MG tablet Commonly known as:  ARICEPT Take 1 tablet (10 mg total) by mouth at bedtime.   lisinopril 10 MG tablet Commonly known as:  PRINIVIL,ZESTRIL Take 10 mg by mouth daily.   MAPAP 500 MG tablet Generic drug:  acetaminophen Take 500 mg by mouth 2 (two) times daily as needed for headache.   Melatonin 3 MG Tabs Take 3 mg by mouth at bedtime.   meloxicam 15 MG tablet Commonly known as:  MOBIC Take 15 mg by mouth daily.   memantine 10 MG tablet Commonly known as:  NAMENDA Take 10 mg by mouth 2 (two) times daily.   metoprolol tartrate 25 MG tablet Commonly known as:  LOPRESSOR Take 0.5 tablets (12.5 mg total) by mouth 2 (two) times daily. What changed:  how much to take   oxyCODONE-acetaminophen 5-325 MG tablet Commonly known as:  PERCOCET/ROXICET Take 1 tablet by mouth every 6 (six) hours as needed.   sertraline 100 MG tablet Commonly known as:  ZOLOFT Take 100 mg by mouth at bedtime.   simvastatin 20 MG tablet Commonly known as:  ZOCOR Take 1 tablet (20 mg total) by mouth daily at 6 PM. What changed:  when to take this        Consultations:  Cardiology   Procedures/Studies:  2D echo  IMPRESSIONS   1. The left ventricle has normal systolic function with an ejection fraction of 60-65%. The cavity size was normal. Left ventricular diastolic Doppler parameters are consistent with impaired relaxation.  2. The right ventricle has normal systolic function. The cavity was normal. There is no increase in right ventricular wall thickness.  3. The mitral valve is normal in structure.  4. The tricuspid valve is normal in structure.  5. The aortic valve is tricuspid Mild sclerosis of  the aortic valve.  6. The pulmonic valve was normal in structure.   Dg Chest Portable 1 View  Result Date: 01/23/2019 CLINICAL DATA:  85 y/o  F; syncopal episode. EXAM: PORTABLE CHEST 1 VIEW COMPARISON:  12/30/2016 chest radiograph FINDINGS: Normal cardiac silhouette. Aortic atherosclerosis with calcification. Left mid lung and lower lung zone peripheral linear opacities. Focal consolidation. No pleural effusion or pneumothorax. No acute osseous abnormality is evident. Chronic fracture deformity of right proximal humerus. IMPRESSION: No acute pulmonary process identified. Left mid and lower lung zone minor atelectasis. Aortic Atherosclerosis (ICD10-I70.0). Electronically Signed   By: Mitzi Hansen M.D.   On: 01/23/2019 14:06      Subjective - no chest pain, shortness of breath, no abdominal pain, nausea or vomiting.   Discharge Exam: BP (!) 141/59   Pulse 62   Temp 98.5 F (36.9 C) (Oral)   Resp 14   Ht  (1.6 m)   Wt 77.1 kg   SpO2 93%   BMI 30.11 kg/m   General: Pt is alert, awake, not in acute distress Cardiovascular: RRR, S1/S2 +, no rubs, no gallops Respiratory: CTA bilaterally, no wheezing, no rhonchi  Abdominal: Soft, NT, ND, bowel sounds + Extremities: no edema, no cyanosis    The results of significant diagnostics from this hospitalization (including imaging, microbiology, ancillary and laboratory) are listed below for reference.     Microbiology: Recent Results (from the past 240 hour(s))  Blood culture (routine x 2)     Status: None (Preliminary result)   Collection Time: 01/23/19  1:20 PM  Result Value Ref Range Status   Specimen Description   Final    BLOOD RIGHT ANTECUBITAL Performed at [facility], [address] [place], [place] [address]    Special Requests   Final    BOTTLES DRAWN AEROBIC AND ANAEROBIC Blood Culture adequate volume Performed at [facility], [address], [place], [place] [address]     Culture   Final    NO GROWTH < 24 HOURS Performed at [facility], [address], [place], [place] [address]    Report Status PENDING  Incomplete  Blood culture (routine x 2)     Status: None (Preliminary result)   Collection Time: 01/23/19  1:20 PM  Result Value Ref Range Status   Specimen Description   Final    BLOOD RIGHT HAND Performed at [facility], [address], [place], [place] [address]    Special Requests   Final    BOTTLES DRAWN AEROBIC AND ANAEROBIC Blood Culture adequate volume Performed at [facility], [address], [place], [place] [address]    Culture   Final    NO GROWTH < 24 HOURS Performed at [facility], [address], [place], [place] [address]    Report Status PENDING  Incomplete  MRSA PCR Screening     Status: None   Collection Time: 01/23/19 10:56 PM  Result Value Ref Range Status   MRSA by PCR NEGATIVE NEGATIVE Final    Comment:        The GeneXpert MRSA Assay (FDA approved for NASAL specimens only), is one component of a comprehensive MRSA colonization surveillance program. It is not intended to diagnose MRSA infection nor to guide or monitor treatment for MRSA infections. Performed at [facility], [address] [place], [place] [address]      Labs: BNP (last 3 results) No results for input(s): BNP in the last 8760 hours. Basic Metabolic Panel: Recent Labs  Lab 01/23/19 1300 01/24/19 0359  NA 140 139  K 3.7 3.8  CL 103 108  CO2 25 25  GLUCOSE 100* 107*  BUN 23 26*  CREATININE 1.53* 1.33*  CALCIUM 9.1 8.4*  MG 1.8  --    Liver Function Tests: No results for input(s): AST, ALT, ALKPHOS, BILITOT, PROT, ALBUMIN in the last 168 hours. No results for input(s): LIPASE, AMYLASE in the last 168 hours. No results for input(s): AMMONIA in the last 168 hours. CBC: Recent Labs  Lab 01/23/19 1405 01/24/19 0359  WBC 12.7* 9.0  NEUTROABS 10.2*  --   HGB 13.3  10.9*  HCT 43.5 35.4*  MCV 95.2 94.7  PLT 236 207   Cardiac Enzymes: Recent Labs  Lab 01/23/19 1300  TROPONINI <0.03   BNP: Invalid input(s): POCBNP CBG: No results for input(s): GLUCAP in the last 168 hours. D-Dimer No results for input(s): DDIMER in the last 72 hours. Hgb A1c No results for input(s): HGBA1C in the last 72 hours. Lipid Profile No results for input(s): CHOL, HDL, LDLCALC, TRIG, CHOLHDL, LDLDIRECT in the last 72  hours. Thyroid function studies No results for input(s): TSH, T4TOTAL, T3FREE, THYROIDAB in the last 72 hours.  Invalid input(s): FREET3 Anemia work up No results for input(s): VITAMINB12, FOLATE, FERRITIN, TIBC, IRON, RETICCTPCT in the last 72 hours. Urinalysis    Component Value Date/Time   COLORURINE YELLOW 01/23/2019 1226   APPEARANCEUR HAZY (A) 01/23/2019 1226   LABSPEC 1.013 01/23/2019 1226   PHURINE 5.0 01/23/2019 1226   GLUCOSEU NEGATIVE 01/23/2019 1226   HGBUR SMALL (A) 01/23/2019 1226   BILIRUBINUR NEGATIVE 01/23/2019 1226   KETONESUR 20 (A) 01/23/2019 1226   PROTEINUR NEGATIVE 01/23/2019 1226   UROBILINOGEN 0.2 05/13/2015 1849   NITRITE POSITIVE (A) 01/23/2019 1226   LEUKOCYTESUR SMALL (A) 01/23/2019 1226   Sepsis Labs Invalid input(s): PROCALCITONIN,  WBC,  LACTICIDVEN  FURTHER DISCHARGE INSTRUCTIONS:   Get Medicines reviewed and adjusted: Please take all your medications with you for your next visit with your Primary MD   Laboratory/radiological data: Please request your Primary MD to go over all hospital tests and procedure/radiological results at the follow up, please ask your Primary MD to get all Hospital records sent to his/her office.   In some cases, they will be blood work, cultures and biopsy results pending at the time of your discharge. Please request that your primary care M.D. goes through all the records of your hospital data and follows up on these results.   Also Note the following: If you experience  worsening of your admission symptoms, develop shortness of breath, life threatening emergency, suicidal or homicidal thoughts you must seek medical attention immediately by calling 911 or calling your MD immediately  if symptoms less severe.   You must read complete instructions/literature along with all the possible adverse reactions/side effects for all the Medicines you take and that have been prescribed to you. Take any new Medicines after you have completely understood and accpet all the possible adverse reactions/side effects.    Do not drive when taking Pain medications or sleeping medications (Benzodaizepines)   Do not take more than prescribed Pain, Sleep and Anxiety Medications. It is not advisable to combine anxiety,sleep and pain medications without talking with your primary care practitioner   Special Instructions: If you have smoked or chewed Tobacco  in the last 2 yrs please stop smoking, stop any regular Alcohol  and or any Recreational drug use.   Wear Seat belts while driving.   Please note: You were cared for by a hospitalist during your hospital stay. Once you are discharged, your primary care physician will handle any further medical issues. Please note that NO REFILLS for any discharge medications will be authorized once you are discharged, as it is imperative that you return to your primary care physician (or establish a relationship with a primary care physician if you do not have one) for your post hospital discharge needs so that they can reassess your need for medications and monitor your lab values.  Time coordinating discharge: 40 minutes  SIGNED:  Pamella Pert, PA-S 01/24/2019, 1:37 PM

## 2019-01-24 NOTE — Progress Notes (Signed)
*   Echocardiogram 2D Echocardiogram has been performed.  Leta Jungling M 01/24/2019, 9:42 AM

## 2019-01-24 NOTE — Progress Notes (Signed)
Report called to [facility]. Iv removed and pt discharged in stable condition.

## 2019-01-24 NOTE — Progress Notes (Signed)
1000 lopressor not given d/t sinus bradycardia. HR 53, BP 141/59.   Nirel Babler, Lavone Orn, RN

## 2019-01-28 LAB — CULTURE, BLOOD (ROUTINE X 2)
Culture: NO GROWTH
Culture: NO GROWTH
Special Requests: ADEQUATE
Special Requests: ADEQUATE

## 2019-01-29 DIAGNOSIS — N183 Chronic kidney disease, stage 3 (moderate): Secondary | ICD-10-CM | POA: Diagnosis not present

## 2019-01-29 DIAGNOSIS — G309 Alzheimer's disease, unspecified: Secondary | ICD-10-CM | POA: Diagnosis not present

## 2019-01-29 DIAGNOSIS — F322 Major depressive disorder, single episode, severe without psychotic features: Secondary | ICD-10-CM | POA: Diagnosis not present

## 2019-01-29 DIAGNOSIS — I129 Hypertensive chronic kidney disease with stage 1 through stage 4 chronic kidney disease, or unspecified chronic kidney disease: Secondary | ICD-10-CM | POA: Diagnosis not present

## 2019-01-29 DIAGNOSIS — R197 Diarrhea, unspecified: Secondary | ICD-10-CM | POA: Diagnosis not present

## 2019-01-29 DIAGNOSIS — R55 Syncope and collapse: Secondary | ICD-10-CM | POA: Diagnosis not present

## 2019-01-29 DIAGNOSIS — E7800 Pure hypercholesterolemia, unspecified: Secondary | ICD-10-CM | POA: Diagnosis not present

## 2019-01-29 DIAGNOSIS — E1122 Type 2 diabetes mellitus with diabetic chronic kidney disease: Secondary | ICD-10-CM | POA: Diagnosis not present

## 2019-02-12 ENCOUNTER — Encounter: Payer: Self-pay | Admitting: Cardiology

## 2019-02-15 ENCOUNTER — Telehealth: Payer: Self-pay

## 2019-02-15 NOTE — Telephone Encounter (Signed)
Full voicemail 3/27

## 2019-02-18 ENCOUNTER — Telehealth: Payer: Self-pay

## 2019-02-18 NOTE — Telephone Encounter (Signed)
Left patient's Med Tech message to call me back. 3/30

## 2019-02-18 NOTE — Telephone Encounter (Signed)
° °  Please call Roselyn Bering to verify information/ instructions given to patient. Call direct to [phone]

## 2019-02-18 NOTE — Telephone Encounter (Signed)

## 2019-02-20 ENCOUNTER — Telehealth: Payer: Self-pay

## 2019-02-20 NOTE — Telephone Encounter (Signed)
The patient is scheduled for a Virtual Visit (Phone) with Robbie Lis on 4/2 @ 10:00.  She resides at [facility].  Marchelle Folks Midwife) will partake in visit.

## 2019-02-20 NOTE — Telephone Encounter (Signed)
The patient was removed from our schedule 4/2 @ 10 am sometime this afternoon.  I called the facility and they were notified that this was a virtual visit, but they did not have video capability, so they cancelled.    I told them that this was arranged for a phone visit with Marchelle Folks), one of their employees assisting.  They then told me that Marchelle Folks is off tomorrow, but Jearld Shines) will assist.  I put the patient back on the schedule 4/2 @ 10.Marland Kitchen

## 2019-02-21 ENCOUNTER — Other Ambulatory Visit: Payer: Self-pay

## 2019-02-21 ENCOUNTER — Encounter: Payer: Self-pay | Admitting: Cardiology

## 2019-02-21 ENCOUNTER — Telehealth (INDEPENDENT_AMBULATORY_CARE_PROVIDER_SITE_OTHER): Payer: Medicare Other | Admitting: Cardiology

## 2019-02-21 ENCOUNTER — Telehealth: Payer: Self-pay

## 2019-02-21 ENCOUNTER — Telehealth: Payer: Medicare Other | Admitting: Cardiology

## 2019-02-21 VITALS — BP 124/77 | HR 80 | Ht 63.0 in | Wt 167.8 lb

## 2019-02-21 DIAGNOSIS — R55 Syncope and collapse: Secondary | ICD-10-CM | POA: Diagnosis not present

## 2019-02-21 NOTE — Telephone Encounter (Signed)
Followed up with [facility] and reviewed Brittainy's recommendations. 4/2

## 2019-02-21 NOTE — Progress Notes (Signed)
Virtual Visit via Telephone Note    Evaluation Performed:  Follow-up visit  This visit type was conducted due to national recommendations for restrictions regarding the COVID-19 Pandemic (e.g. social distancing).  This format is felt to be most appropriate for this patient at this time.  All issues noted in this document were discussed and addressed.  No physical exam was performed (except for noted visual exam findings with Video Visits).  Please refer to the patient's chart (MyChart message for video visits and phone note for telephone visits) for the patient's consent to telehealth for [facility].  Date:  02/21/2019   ID:  [name], DOB Dec 21, 1931, MRN [id number]  Patient Location:  Nursing facility - [facility]  Provider location:   Office [facility] [address] [place], [place]  PCP:  Lupita Raider, MD  Cardiologist:  Jodelle Red, MD  Electrophysiologist:  None   Chief Complaint: Post hospital follow-up for syncope  History of Present Illness:    [name] is a 85 y.o. female who presents via audio/video conferencing for a telehealth visit today.  Nursing facility tech also participated in phone call, [name].   The patient does not have symptoms concerning for COVID-19 infection (fever, chills, cough, or new shortness of breath).   [name] is a 85 y.o. female with a history of SVT, hypertension, hyperlipidemia, type 2 diabetes mellitus, iron deficiency anemia, TIA, and mild cognitive impairment. She resides at a nursing facility.  She was recently admitted to [facility] after a syncopal spell.  Cardiology was asked to evaluate.  Per H&P, she presented to the ED from nursing facility ([facility]) via EMS for evaluation of syncope. Patient did not know what brought her to the hospital and she did not remember passing out. She denied any symptoms including chest pain, shortness of breath, palpitations, lightheadedness,  dizziness, orthopnea, PND, or edema. She stated she thinks she had a couple of episodes of diarrhea once she got to the hospital but denied any fevers, nausea, vomiting, decrease PO intake, or recent illnesses. Per chart review, patient apparently had a brief syncopal episode lasting a couple of seconds with quick return to baseline. When EMS arrived her initial BP was 88/55 but increased to 188/70 after a 250 mL bolus was given. BP then dropped back to 80/50 again so more fluid was given. EMS also reports a possible run of Torsades but we do not have any strips from that time. The ED physician did review this tracing and reports  "only a couple seconds of bizarre morphology as recording was beginning" that he suspects was simply artifact.   The patient was seen in consultation by Dr. Cristal Deer. Tele showed only sinus rhythm, no ectopy (just rare artifact). Given that her symptoms and hypotension resolved with fluid, hypovolemia was felt to be a likely trigger. Her echo showed normal EF, grade 1 DD, no significant valve disease.  With no structural heart disease, no conduction disease on ECG, and no arrhythmia on telemetry, it was fet unlikely that this episode of syncope was cardiac in nature. We did not feel that a cardiac monitor was indicated at time of hospital d/c, but could be considered if she has a recurrent episode.  Patient has done well post hospital.  She has not had any recurrent syncope or near syncope.  They have been paying close attention to her blood pressure and she has not had any further hypotension.  No bradycardia or tachycardia.  She ambulates well with use of an assistive device.  No exertional symptoms.  No reports of chest pain.   Prior CV studies:   The following studies were reviewed today:  2D Echo 01/24/19 IMPRESSIONS    1. The left ventricle has normal systolic function with an ejection fraction of 60-65%. The cavity size was normal. Left ventricular diastolic  Doppler parameters are consistent with impaired relaxation.  2. The right ventricle has normal systolic function. The cavity was normal. There is no increase in right ventricular wall thickness.  3. The mitral valve is normal in structure.  4. The tricuspid valve is normal in structure.  5. The aortic valve is tricuspid Mild sclerosis of the aortic valve.  6. The pulmonic valve was normal in structure.   Past Medical History:  Diagnosis Date  . Allergies   . Alzheimer disease (HCC)    STABLE  . Anemia, iron deficiency 12/12/2011  . Arthritis    RF  . CKD (chronic kidney disease)    STAGE 3  . Diabetes mellitus    no mes, diet only   . Diarrhea    RESOLVED  . Hypercholesterolemia   . Hyperlipemia   . Hypertension   . Major depression   . OA (osteoarthritis)    HAND/LEFT KNEE PAIN MILD  . Osteoporosis   . Syncope    Past Surgical History:  Procedure Laterality Date  . left knee arthroscopy    . ORIF HUMERUS FRACTURE  12/09/2011   Procedure: OPEN REDUCTION INTERNAL FIXATION (ORIF) DISTAL HUMERUS FRACTURE;  Surgeon: Sharma Covert, MD;  Location: [facility];  Service: Orthopedics;  Laterality: Right;  . OTHER SURGICAL HISTORY     left small toe bone spur removed   . TONSILLECTOMY       Current Meds  Medication Sig  . acetaminophen (MAPAP) 500 MG tablet Take 500 mg by mouth 3 (three) times daily as needed for headache.   Marland Kitchen aspirin EC 81 MG EC tablet Take 1 tablet (81 mg total) by mouth daily.  . Calcium Carbonate-Vitamin D3 (CALCIUM 600-D) 600-400 MG-UNIT TABS Take 2 tablets by mouth every morning.  . docusate sodium (COLACE) 100 MG capsule Take 1 capsule (100 mg total) by mouth every 12 (twelve) hours.  Marland Kitchen lisinopril (PRINIVIL,ZESTRIL) 10 MG tablet Take 10 mg by mouth daily.  . Melatonin 3 MG TABS Take 3 mg by mouth at bedtime.  . meloxicam (MOBIC) 15 MG tablet Take 15 mg by mouth daily.  . memantine (NAMENDA) 10 MG tablet Take 10 mg by mouth 2 (two) times daily.  .  metoprolol tartrate (LOPRESSOR) 25 MG tablet Take 25 mg by mouth 2 (two) times daily.  Marland Kitchen oxyCODONE-acetaminophen (PERCOCET/ROXICET) 5-325 MG tablet Take 1 tablet by mouth every 6 (six) hours as needed.  . sertraline (ZOLOFT) 100 MG tablet Take 100 mg by mouth at bedtime.  . simvastatin (ZOCOR) 20 MG tablet Take 1 tablet (20 mg total) by mouth daily at 6 PM.  . vitamin B-12 (CYANOCOBALAMIN) 1000 MCG tablet Take 1,000 mcg by mouth 2 (two) times daily.     Allergies:   Sulfa antibiotics and Latex   Social History   Tobacco Use  . Smoking status: Former Smoker    Types: Cigarettes    Last attempt to quit: 12/29/2009    Years since quitting: 9.1  . Smokeless tobacco: Never Used  Substance Use Topics  . Alcohol use: No    Alcohol/week: 0.0 standard drinks  . Drug use: No  Family Hx: The patient's family history includes Heart Problems in her mother; Heart attack in her maternal grandmother. There is no history of Stroke or Arrhythmia.  ROS:   Please see the history of present illness.     All other systems reviewed and are negative.   Labs/Other Tests and Data Reviewed:    Recent Labs: 12/26/2018: ALT 14 01/23/2019: Magnesium 1.8 01/24/2019: BUN 26; Creatinine, Ser 1.33; Hemoglobin 10.9; Platelets 207; Potassium 3.8; Sodium 139   Recent Lipid Panel Lab Results  Component Value Date/Time   CHOL 219 (H) 05/14/2015 05:06 AM   TRIG 58 05/14/2015 05:06 AM   HDL 56 05/14/2015 05:06 AM   CHOLHDL 3.9 05/14/2015 05:06 AM   LDLCALC 151 (H) 05/14/2015 05:06 AM    Wt Readings from Last 3 Encounters:  02/21/19 167 lb 12.8 oz (76.1 kg)  01/23/19 170 lb (77.1 kg)  12/26/18 165 lb (74.8 kg)     Objective:    Vital Signs:  BP 124/77   Pulse 80   Ht  (1.6 m)   Wt 167 lb 12.8 oz (76.1 kg)   BMI 29.72 kg/m    Well sounding female in no acute distress. Breathing unlabored. Speech unlabored.    ASSESSMENT & PLAN:    1.  Syncope: Hospital work-up was unremarkable.  Her  telemetry during her hospitalization showed no significant arrhythmias.  Her physical exam was benign and her echocardiogram showed no structural heart disease.  Left ventricular ejection fraction was normal and there was no significant valvular dysfunction.  It was noted that she was hypotensive upon EMS arrival to nursing facility with systolic blood pressures in the 80s.  She was also felt to be hypovolemic given reports of recent diarrhea.  Both her blood pressure and hypovolemia resolved with IV fluid resuscitation.  It was felt that her syncope was likely secondary to hypovolemia and hypotension.  Outpatient monitor was not felt to be indicated at time of discharge, unless she had recurrent syncope.  She has done well post hospital.  She has not had any recurrent syncope/near syncope.  The nursing staff at [facility] have been checking her blood pressure regularly she has had no further hypotension.  She has also had no bradycardia or tachycardia.  No cardiac symptoms.  No plans for further cardiac work-up at this point.  No indication for cardiac monitor.  We will have her follow-up again, in clinic, in 3-6 months with Dr. Cristal Deer for reevaluation. Nursing staff at [facility] aware to contact us if any recurrent syncope.  COVID-19 Education: The signs and symptoms of COVID-19 were discussed with the patient and how to seek care for testing (follow up with PCP or arrange E-visit).  The importance of social distancing was discussed today.  Patient Risk:   After full review of this patient's clinical status, I feel that they are at least moderate risk at this time.  Time:   Today, I have spent 10 minutes with the patient with telehealth technology discussing her post hospital course and educating on symptoms concerning for cardiac causes of syncope.    Medication Adjustments/Labs and Tests Ordered: Current medicines are reviewed at length with the patient today.  Concerns regarding medicines are  outlined above.  Tests Ordered: No orders of the defined types were placed in this encounter.  Medication Changes: No orders of the defined types were placed in this encounter.   Disposition:  Follow up Dr. Cristal Deer in 3-6 months   Signed, Robbie Lis, PA-C  02/21/2019 9:56 AM    [facility]

## 2019-04-23 DIAGNOSIS — Z20828 Contact with and (suspected) exposure to other viral communicable diseases: Secondary | ICD-10-CM | POA: Diagnosis not present

## 2019-07-16 DIAGNOSIS — Z20828 Contact with and (suspected) exposure to other viral communicable diseases: Secondary | ICD-10-CM | POA: Diagnosis not present

## 2019-08-06 DIAGNOSIS — Z20828 Contact with and (suspected) exposure to other viral communicable diseases: Secondary | ICD-10-CM | POA: Diagnosis not present

## 2019-08-13 ENCOUNTER — Encounter: Payer: Self-pay | Admitting: Cardiology

## 2019-08-13 ENCOUNTER — Telehealth (INDEPENDENT_AMBULATORY_CARE_PROVIDER_SITE_OTHER): Payer: Medicare Other | Admitting: Cardiology

## 2019-08-13 VITALS — BP 132/78

## 2019-08-13 DIAGNOSIS — Z87898 Personal history of other specified conditions: Secondary | ICD-10-CM

## 2019-08-13 NOTE — Patient Instructions (Signed)
Medication Instructions:  Your Physician recommend you continue on your current medication as directed.    If you need a refill on your cardiac medications before your next appointment, please call your pharmacy.   Lab work: None  Testing/Procedures: None  Follow-Up: Your physician recommends that you schedule a follow-up appointment as needed with Dr. Christopher.     

## 2019-08-13 NOTE — Progress Notes (Signed)
Virtual Visit via Telephone Note   This visit type was conducted due to national recommendations for restrictions regarding the COVID-19 Pandemic (e.g. social distancing) in an effort to limit this patient's exposure and mitigate transmission in our community.  Due to her co-morbid illnesses, this patient is at least at moderate risk for complications without adequate follow up.  This format is felt to be most appropriate for this patient at this time.  The patient did not have access to video technology/had technical difficulties with video requiring transitioning to audio format only (telephone).  All issues noted in this document were discussed and addressed.  No physical exam could be performed with this format.  Please refer to the patient's chart for her  consent to telehealth for [employer]   Date:  08/13/2019   ID:  [name], DOB Nov 06, 1932, MRN [id number]  Patient Location: [facility] Provider Location: Office  PCP:  Mayra Neer, MD  Cardiologist:  Buford Dresser, MD  Electrophysiologist:  None   Evaluation Performed:  Follow-Up Visit  Chief Complaint:  Follow up  History of Present Illness:    [name] is a 85 y.o. female with a history of SVT,hypertension, hyperlipidemia, type 2 diabetes mellitus, iron deficiency anemia, TIA, and mild cognitive impairment.  The patient does not have symptoms concerning for COVID-19 infection (fever, chills, cough, or new shortness of breath).   Cardiac history: I met her during a hospitalization in 01/2019 for syncope. No arrhythmia/block on tele, normal echo, symptoms improved with hydration.  She lives at [facility] [facility]. Spoke to Lelan Pons, her nurse, as well as the patient. Both endorse that she is doing well. No further syncope. No chest pain or shortness of breath. Blood pressures have been well controlled.   Denies chest pain, shortness of breath at rest or with normal exertion. No  PND, orthopnea, LE edema or unexpected weight gain. No syncope or palpitations.  Past Medical History:  Diagnosis Date  . Allergies   . Alzheimer disease ([place])    STABLE  . Anemia, iron deficiency 12/12/2011  . Arthritis    RF  . CKD (chronic kidney disease)    STAGE 3  . Diabetes mellitus    no mes, diet only   . Diarrhea    RESOLVED  . Hypercholesterolemia   . Hyperlipemia   . Hypertension   . Major depression   . OA (osteoarthritis)    HAND/LEFT KNEE PAIN MILD  . Osteoporosis   . Syncope    Past Surgical History:  Procedure Laterality Date  . left knee arthroscopy    . ORIF HUMERUS FRACTURE  12/09/2011   Procedure: OPEN REDUCTION INTERNAL FIXATION (ORIF) DISTAL HUMERUS FRACTURE;  Surgeon: Linna Hoff, MD;  Location: [facility];  Service: Orthopedics;  Laterality: Right;  . OTHER SURGICAL HISTORY     left small toe bone spur removed   . TONSILLECTOMY       Current Meds  Medication Sig  . acetaminophen (MAPAP) 500 MG tablet Take 500 mg by mouth 3 (three) times daily as needed for headache.   Marland Kitchen aspirin EC 81 MG EC tablet Take 1 tablet (81 mg total) by mouth daily.  . Calcium Carbonate-Vitamin D3 (CALCIUM 600-D) 600-400 MG-UNIT TABS Take 2 tablets by mouth every morning.  . docusate sodium (COLACE) 100 MG capsule Take 1 capsule (100 mg total) by mouth every 12 (twelve) hours.  Marland Kitchen lisinopril (PRINIVIL,ZESTRIL) 10 MG tablet Take 10 mg by mouth daily.  Marland Kitchen  Melatonin 3 MG TABS Take 3 mg by mouth at bedtime.  . meloxicam (MOBIC) 15 MG tablet Take 15 mg by mouth daily.  . memantine (NAMENDA) 10 MG tablet Take 10 mg by mouth 2 (two) times daily.  . metoprolol tartrate (LOPRESSOR) 25 MG tablet Take 25 mg by mouth 2 (two) times daily.  Marland Kitchen oxyCODONE-acetaminophen (PERCOCET/ROXICET) 5-325 MG tablet Take 1 tablet by mouth every 6 (six) hours as needed.  . sertraline (ZOLOFT) 100 MG tablet Take 100 mg by mouth at bedtime.  . simvastatin (ZOCOR) 20 MG tablet Take 1 tablet (20 mg total) by  mouth daily at 6 PM.  . vitamin B-12 (CYANOCOBALAMIN) 1000 MCG tablet Take 1,000 mcg by mouth 2 (two) times daily.     Allergies:   Sulfa antibiotics and Latex   Social History   Tobacco Use  . Smoking status: Former Smoker    Types: Cigarettes    Quit date: 12/29/2009    Years since quitting: 9.6  . Smokeless tobacco: Never Used  Substance Use Topics  . Alcohol use: No    Alcohol/week: 0.0 standard drinks  . Drug use: No     Family Hx: The patient's family history includes Heart Problems in her mother; Heart attack in her maternal grandmother. There is no history of Stroke or Arrhythmia.  ROS:   Please see the history of present illness.    All other systems reviewed and are negative.   Prior CV studies:   The following studies were reviewed today: Prior echo, ecg from hospitalization  Labs/Other Tests and Data Reviewed:    EKG:  An ECG dated 01/23/19 was personally reviewed today and demonstrated:  NSR  Recent Labs: 12/26/2018: ALT 14 01/23/2019: Magnesium 1.8 01/24/2019: BUN 26; Creatinine, Ser 1.33; Hemoglobin 10.9; Platelets 207; Potassium 3.8; Sodium 139   Recent Lipid Panel Lab Results  Component Value Date/Time   CHOL 219 (H) 05/14/2015 05:06 AM   TRIG 58 05/14/2015 05:06 AM   HDL 56 05/14/2015 05:06 AM   CHOLHDL 3.9 05/14/2015 05:06 AM   LDLCALC 151 (H) 05/14/2015 05:06 AM    Wt Readings from Last 3 Encounters:  02/21/19 167 lb 12.8 oz (76.1 kg)  01/23/19 170 lb (77.1 kg)  12/26/18 165 lb (74.8 kg)     Objective:    Vital Signs:  BP 132/78    Speaking comfortably on the phone No acute distress  ASSESSMENT & PLAN:    History of syncope: no recurrence. Doing well overall. -can return to cardiology PRN  COVID-19 Education: The signs and symptoms of COVID-19 were discussed with the patient and how to seek care for testing (follow up with PCP or arrange E-visit).  The importance of social distancing was discussed today.  Time:   Today, I have spent  12 minutes with the patient with telehealth technology discussing the above problems.     Medication Adjustments/Labs and Tests Ordered: Current medicines are reviewed at length with the patient today.  Concerns regarding medicines are outlined above.   Tests Ordered: No orders of the defined types were placed in this encounter.   Medication Changes: No orders of the defined types were placed in this encounter.   Follow Up:  PRN  Signed, Buford Dresser, MD  08/13/2019 2:25 PM    [facility]

## 2019-08-20 DIAGNOSIS — Z20828 Contact with and (suspected) exposure to other viral communicable diseases: Secondary | ICD-10-CM | POA: Diagnosis not present

## 2019-09-03 DIAGNOSIS — Z20828 Contact with and (suspected) exposure to other viral communicable diseases: Secondary | ICD-10-CM | POA: Diagnosis not present

## 2019-09-07 ENCOUNTER — Emergency Department (HOSPITAL_COMMUNITY): Payer: Medicare Other

## 2019-09-07 ENCOUNTER — Encounter (HOSPITAL_COMMUNITY): Payer: Self-pay | Admitting: Emergency Medicine

## 2019-09-07 ENCOUNTER — Emergency Department (HOSPITAL_COMMUNITY)
Admission: EM | Admit: 2019-09-07 | Discharge: 2019-09-07 | Disposition: A | Discharge status: Home / Self Care | Payer: Medicare Other | Attending: Emergency Medicine | Admitting: Emergency Medicine

## 2019-09-07 DIAGNOSIS — I129 Hypertensive chronic kidney disease with stage 1 through stage 4 chronic kidney disease, or unspecified chronic kidney disease: Secondary | ICD-10-CM | POA: Insufficient documentation

## 2019-09-07 DIAGNOSIS — Z7982 Long term (current) use of aspirin: Secondary | ICD-10-CM | POA: Diagnosis not present

## 2019-09-07 DIAGNOSIS — W010XXA Fall on same level from slipping, tripping and stumbling without subsequent striking against object, initial encounter: Secondary | ICD-10-CM | POA: Insufficient documentation

## 2019-09-07 DIAGNOSIS — Y939 Activity, unspecified: Secondary | ICD-10-CM | POA: Insufficient documentation

## 2019-09-07 DIAGNOSIS — S29011A Strain of muscle and tendon of front wall of thorax, initial encounter: Secondary | ICD-10-CM | POA: Insufficient documentation

## 2019-09-07 DIAGNOSIS — Z91040 Latex allergy status: Secondary | ICD-10-CM | POA: Insufficient documentation

## 2019-09-07 DIAGNOSIS — S299XXA Unspecified injury of thorax, initial encounter: Secondary | ICD-10-CM | POA: Diagnosis present

## 2019-09-07 DIAGNOSIS — Z743 Need for continuous supervision: Secondary | ICD-10-CM | POA: Diagnosis not present

## 2019-09-07 DIAGNOSIS — W19XXXA Unspecified fall, initial encounter: Secondary | ICD-10-CM

## 2019-09-07 DIAGNOSIS — R0902 Hypoxemia: Secondary | ICD-10-CM | POA: Diagnosis not present

## 2019-09-07 DIAGNOSIS — R0781 Pleurodynia: Secondary | ICD-10-CM | POA: Diagnosis not present

## 2019-09-07 DIAGNOSIS — G309 Alzheimer's disease, unspecified: Secondary | ICD-10-CM | POA: Diagnosis not present

## 2019-09-07 DIAGNOSIS — Y929 Unspecified place or not applicable: Secondary | ICD-10-CM | POA: Diagnosis not present

## 2019-09-07 DIAGNOSIS — S3993XA Unspecified injury of pelvis, initial encounter: Secondary | ICD-10-CM | POA: Diagnosis not present

## 2019-09-07 DIAGNOSIS — Z87891 Personal history of nicotine dependence: Secondary | ICD-10-CM | POA: Diagnosis not present

## 2019-09-07 DIAGNOSIS — Y999 Unspecified external cause status: Secondary | ICD-10-CM | POA: Insufficient documentation

## 2019-09-07 DIAGNOSIS — R52 Pain, unspecified: Secondary | ICD-10-CM | POA: Diagnosis not present

## 2019-09-07 DIAGNOSIS — N183 Chronic kidney disease, stage 3 unspecified: Secondary | ICD-10-CM | POA: Insufficient documentation

## 2019-09-07 DIAGNOSIS — I469 Cardiac arrest, cause unspecified: Secondary | ICD-10-CM | POA: Diagnosis not present

## 2019-09-07 DIAGNOSIS — R402411 Glasgow coma scale score 13-15, in the field [EMT or ambulance]: Secondary | ICD-10-CM | POA: Diagnosis not present

## 2019-09-07 DIAGNOSIS — I10 Essential (primary) hypertension: Secondary | ICD-10-CM | POA: Diagnosis not present

## 2019-09-07 DIAGNOSIS — R279 Unspecified lack of coordination: Secondary | ICD-10-CM | POA: Diagnosis not present

## 2019-09-07 DIAGNOSIS — Z79899 Other long term (current) drug therapy: Secondary | ICD-10-CM | POA: Diagnosis not present

## 2019-09-07 DIAGNOSIS — S0990XA Unspecified injury of head, initial encounter: Secondary | ICD-10-CM | POA: Diagnosis not present

## 2019-09-07 LAB — CBC WITH DIFFERENTIAL/PLATELET
Abs Immature Granulocytes: 0.03 10*3/uL (ref 0.00–0.07)
Basophils Absolute: 0.1 10*3/uL (ref 0.0–0.1)
Basophils Relative: 1 %
Eosinophils Absolute: 0.5 10*3/uL (ref 0.0–0.5)
Eosinophils Relative: 6 %
HCT: 41.4 % (ref 36.0–46.0)
Hemoglobin: 12.8 g/dL (ref 12.0–15.0)
Immature Granulocytes: 0 %
Lymphocytes Relative: 27 %
Lymphs Abs: 2.3 10*3/uL (ref 0.7–4.0)
MCH: 29.4 pg (ref 26.0–34.0)
MCHC: 30.9 g/dL (ref 30.0–36.0)
MCV: 95 fL (ref 80.0–100.0)
Monocytes Absolute: 0.6 10*3/uL (ref 0.1–1.0)
Monocytes Relative: 7 %
Neutro Abs: 4.9 10*3/uL (ref 1.7–7.7)
Neutrophils Relative %: 59 %
Platelets: 232 10*3/uL (ref 150–400)
RBC: 4.36 MIL/uL (ref 3.87–5.11)
RDW: 12.8 % (ref 11.5–15.5)
WBC: 8.4 10*3/uL (ref 4.0–10.5)
nRBC: 0 % (ref 0.0–0.2)

## 2019-09-07 LAB — BASIC METABOLIC PANEL
Anion gap: 7 (ref 5–15)
BUN: 29 mg/dL — ABNORMAL HIGH (ref 8–23)
CO2: 27 mmol/L (ref 22–32)
Calcium: 9.3 mg/dL (ref 8.9–10.3)
Chloride: 104 mmol/L (ref 98–111)
Creatinine, Ser: 1.21 mg/dL — ABNORMAL HIGH (ref 0.44–1.00)
GFR calc Af Amer: 47 mL/min — ABNORMAL LOW (ref >60)
GFR calc non Af Amer: 41 mL/min — ABNORMAL LOW (ref >60)
Glucose, Bld: 110 mg/dL — ABNORMAL HIGH (ref 70–99)
Potassium: 4.5 mmol/L (ref 3.5–5.1)
Sodium: 138 mmol/L (ref 135–145)

## 2019-09-07 NOTE — ED Notes (Signed)
ED Provider at bedside. 

## 2019-09-07 NOTE — Discharge Instructions (Signed)
If patient develops difficulty breathing, chest pain, further episodes of falls or any episodes of passing out, please have her return to ER for reassessment.  Otherwise recommend follow-up with PCP next week for recheck.

## 2019-09-07 NOTE — ED Notes (Signed)
Patient transported to CT 

## 2019-09-07 NOTE — ED Notes (Signed)
Pt able to ambulate to/from the restroom with standby assist. Pt now back in bed and resting at this time.

## 2019-09-07 NOTE — ED Notes (Signed)
Patient transported to X-ray 

## 2019-09-07 NOTE — ED Notes (Signed)
[facility] called for transport.  

## 2019-09-07 NOTE — ED Provider Notes (Signed)
[facility]-EMERGENCY DEPT Provider Note   CSN: [id number] Arrival date & time: 09/07/19  1125     History   Chief Complaint Chief Complaint  Patient presents with  . Fall    HPI [name] is a 85 y.o. female.   Level 5 caveat history limited due to patient's dementia, however patient is able to readily answer basic questions and provided some history.  Patient had unwitnessed fall last night, patient states cannot recall whether or not there was loss of consciousness.  Thinks that she fell and hit her head on the ground.  Also complaining of right rib pain.  Patient states this is mild, unsure when it started.  States at rest currently she is having no pain.  No difficulty with ambulation.  Specifically she denies shortness of breath, abdominal pain, nausea, vomiting.     HPI  Past Medical History:  Diagnosis Date  . Allergies   . Alzheimer disease (HCC)    STABLE  . Anemia, iron deficiency 12/12/2011  . Arthritis    RF  . CKD (chronic kidney disease)    STAGE 3  . Diabetes mellitus    no mes, diet only   . Diarrhea    RESOLVED  . Hypercholesterolemia   . Hyperlipemia   . Hypertension   . Major depression   . OA (osteoarthritis)    HAND/LEFT KNEE PAIN MILD  . Osteoporosis   . Syncope     Patient Active Problem List   Diagnosis Date Noted  . History of syncope 08/13/2019  . Syncope and collapse 01/23/2019  . Leukocytosis 01/23/2019  . Diarrhea 01/23/2019  . Prolonged QT interval 01/23/2019  . Mild cognitive impairment 07/15/2015  . Acute confusional state 06/19/2015  . Memory loss 06/19/2015  . DM II (diabetes mellitus, type II), controlled (HCC) 05/14/2015  . Hyperlipidemia 05/14/2015  . Borderline diabetes mellitus   . Essential hypertension, benign   . TIA (transient ischemic attack) 05/13/2015  . Anemia, iron deficiency 12/12/2011  . Humerus fracture 12/11/2011  . Hypoxia 12/11/2011  . Wide-complex tachycardia (HCC)  12/11/2011  . Acute hypotension 12/11/2011    Past Surgical History:  Procedure Laterality Date  . left knee arthroscopy    . ORIF HUMERUS FRACTURE  12/09/2011   Procedure: OPEN REDUCTION INTERNAL FIXATION (ORIF) DISTAL HUMERUS FRACTURE;  Surgeon: Sharma Covert, MD;  Location: [facility];  Service: Orthopedics;  Laterality: Right;  . OTHER SURGICAL HISTORY     left small toe bone spur removed   . TONSILLECTOMY       OB History   No obstetric history on file.      Home Medications    Prior to Admission medications   Medication Sig Start Date End Date Taking? Authorizing Provider  acetaminophen (MAPAP) 500 MG tablet Take 500 mg by mouth 3 (three) times daily as needed for mild pain.    Yes [provider]  aspirin EC 81 MG EC tablet Take 1 tablet (81 mg total) by mouth daily. 05/15/15  Yes Joseph Art, DO  Calcium Carbonate-Vitamin D3 (CALCIUM 600-D) 600-400 MG-UNIT TABS Take 2 tablets by mouth every morning.   Yes [provider]  docusate sodium (COLACE) 100 MG capsule Take 1 capsule (100 mg total) by mouth every 12 (twelve) hours. 12/27/16  Yes Ward, Baxter Hire N, DO  lisinopril (PRINIVIL,ZESTRIL) 10 MG tablet Take 10 mg by mouth daily.   Yes [provider]  Melatonin 3 MG TABS Take 3 mg  by mouth at bedtime.   Yes [provider]  meloxicam (MOBIC) 15 MG tablet Take 15 mg by mouth daily.   Yes [provider]  memantine (NAMENDA) 10 MG tablet Take 10 mg by mouth 2 (two) times daily.   Yes [provider]  metoprolol tartrate (LOPRESSOR) 25 MG tablet Take 25 mg by mouth 2 (two) times daily.   Yes [provider]  sertraline (ZOLOFT) 100 MG tablet Take 100 mg by mouth at bedtime.   Yes [provider]  simvastatin (ZOCOR) 20 MG tablet Take 1 tablet (20 mg total) by mouth daily at 6 PM. 05/15/15  Yes Vann, Jessica U, DO  vitamin B-12 (CYANOCOBALAMIN) 1000 MCG tablet Take 1,000 mcg by mouth 2 (two) times daily.   Yes  [provider]    Family History Family History  Problem Relation Age of Onset  . Heart Problems Mother   . Heart attack Maternal Grandmother        died from a "heart attack"  . Stroke Neg Hx   . Arrhythmia Neg Hx     Social History Social History   Tobacco Use  . Smoking status: Former Smoker    Types: Cigarettes    Quit date: 12/29/2009    Years since quitting: 9.6  . Smokeless tobacco: Never Used  Substance Use Topics  . Alcohol use: No    Alcohol/week: 0.0 standard drinks  . Drug use: No     Allergies   Sulfa antibiotics and Latex   Review of Systems Review of Systems  Unable to perform ROS: Dementia  Constitutional: Negative for chills and fever.  HENT: Negative for ear pain and sore throat.   Eyes: Negative for pain and visual disturbance.  Respiratory: Negative for cough and shortness of breath.   Cardiovascular: Negative for chest pain and palpitations.  Gastrointestinal: Negative for abdominal pain and vomiting.  Genitourinary: Negative for dysuria and hematuria.  Musculoskeletal: Negative for arthralgias and back pain.  Skin: Negative for color change and rash.  Neurological: Positive for syncope. Negative for seizures.  All other systems reviewed and are negative.    Physical Exam Updated Vital Signs BP (!) 156/63 (BP Location: Left Arm)   Pulse (!) 57   Temp 98.3 F (36.8 C) (Oral)   Resp (!) 22   SpO2 96%   Physical Exam Vitals signs and nursing note reviewed.  Constitutional:      General: She is not in acute distress.    Appearance: She is well-developed.  HENT:     Head: Normocephalic.  Eyes:     Conjunctiva/sclera: Conjunctivae normal.  Neck:     Musculoskeletal: Neck supple.  Cardiovascular:     Rate and Rhythm: Normal rate and regular rhythm.     Heart sounds: No murmur.  Pulmonary:     Effort: Pulmonary effort is normal. No respiratory distress.     Breath sounds: Normal breath sounds.     Comments: Mild  tenderness palpation of right anterior chest wall, no crepitus no deformity Abdominal:     Palpations: Abdomen is soft.     Tenderness: There is no abdominal tenderness.  Musculoskeletal:     Comments: No tenderness palpation throughout bilateral upper and lower extremities, no obvious deformity of bilateral upper and lower extremities  No tenderness palpation over C, T, L-spine  Skin:    General: Skin is warm and dry.  Neurological:     Mental Status: She is alert.     Comments:  Alert, oriented to person, place but not time      ED Treatments / Results  Labs (all labs ordered are listed, but only abnormal results are displayed) Labs Reviewed  BASIC METABOLIC PANEL - Abnormal; Notable for the following components:      Result Value   Glucose, Bld 110 (*)    BUN 29 (*)    Creatinine, Ser 1.21 (*)    GFR calc non Af Amer 41 (*)    GFR calc Af Amer 47 (*)    All other components within normal limits  CBC WITH DIFFERENTIAL/PLATELET    EKG EKG Interpretation  Date/Time:  Saturday September 07 2019 11:41:08 EDT Ventricular Rate:  55 PR Interval:    QRS Duration: 92 QT Interval:  427 QTC Calculation: 409 R Axis:   33 Text Interpretation:  Sinus rhythm Borderline low voltage, extremity leads No significant change since March 2020 Confirmed by Madalyn Rob [phone]) on 09/07/2019 12:08:46 PM   Radiology No results found.  Procedures Procedures (including critical care time)  Medications Ordered in ED Medications - No data to display   Initial Impression / Assessment and Plan / ED Course  I have reviewed the triage vital signs and the nursing notes.  Pertinent labs & imaging results that were available during my care of the patient were reviewed by me and considered in my medical decision making (see chart for details).  Clinical Course as of Sep 06 1350  Sat Sep 07, 2019  1223 Complete initial assessment, will check CT head, plain films of chest and labs   [RD]   53 Recheck patient, remains well-appearing, going for CT head at this time   [RD]    Clinical Course User Index [RD] Lucrezia Starch, MD       85 year old lady past medical history dementia, neuro baseline, very pleasant, well-appearing.  Reported head trauma, no significant head trauma though noted on my exam.  CT head negative.  Screening labs were within normal limits, no acute ischemic changes noted on her EKG. no events on telemetry monitoring. Will discharge back to SNF.  Recommend recheck with PCP next week.    After the discussed management above, the patient was determined to be safe for discharge.  The patient was in agreement with this plan and all questions regarding their care were answered.  ED return precautions were discussed and the patient will return to the ED with any significant worsening of condition.    Final Clinical Impressions(s) / ED Diagnoses   Final diagnoses:  Fall, initial encounter  Muscle strain of chest wall, initial encounter    ED Discharge Orders    None       Lucrezia Starch, MD 09/07/19 1352

## 2019-09-07 NOTE — ED Triage Notes (Signed)
Per EMS, patient from [facility], patient hx dementia had unwitnessed fall last night and reports she got back into bed. C/o right rib pain. Reports pain worsens with movement. Ambulatory. Denies neck, back, and head pain. Denies blood thinners. A&Ox3. Baseline per staff.

## 2019-09-10 DIAGNOSIS — R0789 Other chest pain: Secondary | ICD-10-CM | POA: Diagnosis not present

## 2019-10-01 DIAGNOSIS — Z20828 Contact with and (suspected) exposure to other viral communicable diseases: Secondary | ICD-10-CM | POA: Diagnosis not present

## 2019-10-15 DIAGNOSIS — Z20828 Contact with and (suspected) exposure to other viral communicable diseases: Secondary | ICD-10-CM | POA: Diagnosis not present

## 2019-10-22 DIAGNOSIS — Z20828 Contact with and (suspected) exposure to other viral communicable diseases: Secondary | ICD-10-CM | POA: Diagnosis not present

## 2019-10-29 DIAGNOSIS — Z20828 Contact with and (suspected) exposure to other viral communicable diseases: Secondary | ICD-10-CM | POA: Diagnosis not present

## 2019-11-08 DIAGNOSIS — Z20828 Contact with and (suspected) exposure to other viral communicable diseases: Secondary | ICD-10-CM | POA: Diagnosis not present

## 2019-11-12 DIAGNOSIS — Z20828 Contact with and (suspected) exposure to other viral communicable diseases: Secondary | ICD-10-CM | POA: Diagnosis not present

## 2019-11-26 DIAGNOSIS — Z20828 Contact with and (suspected) exposure to other viral communicable diseases: Secondary | ICD-10-CM | POA: Diagnosis not present

## 2019-12-03 DIAGNOSIS — Z20828 Contact with and (suspected) exposure to other viral communicable diseases: Secondary | ICD-10-CM | POA: Diagnosis not present

## 2019-12-10 DIAGNOSIS — Z20828 Contact with and (suspected) exposure to other viral communicable diseases: Secondary | ICD-10-CM | POA: Diagnosis not present

## 2019-12-17 DIAGNOSIS — Z20828 Contact with and (suspected) exposure to other viral communicable diseases: Secondary | ICD-10-CM | POA: Diagnosis not present

## 2019-12-24 DIAGNOSIS — Z20828 Contact with and (suspected) exposure to other viral communicable diseases: Secondary | ICD-10-CM | POA: Diagnosis not present

## 2019-12-31 DIAGNOSIS — Z20828 Contact with and (suspected) exposure to other viral communicable diseases: Secondary | ICD-10-CM | POA: Diagnosis not present

## 2020-01-03 ENCOUNTER — Other Ambulatory Visit: Payer: Self-pay

## 2020-01-03 ENCOUNTER — Emergency Department (HOSPITAL_COMMUNITY)
Admission: EM | Admit: 2020-01-03 | Discharge: 2020-01-04 | Disposition: A | Discharge status: Home / Self Care | Payer: Medicare Other | Attending: Emergency Medicine | Admitting: Emergency Medicine

## 2020-01-03 ENCOUNTER — Encounter (HOSPITAL_COMMUNITY): Payer: Self-pay | Admitting: Emergency Medicine

## 2020-01-03 ENCOUNTER — Emergency Department (HOSPITAL_COMMUNITY): Payer: Medicare Other

## 2020-01-03 DIAGNOSIS — Z79899 Other long term (current) drug therapy: Secondary | ICD-10-CM | POA: Diagnosis not present

## 2020-01-03 DIAGNOSIS — N3000 Acute cystitis without hematuria: Secondary | ICD-10-CM | POA: Diagnosis not present

## 2020-01-03 DIAGNOSIS — N183 Chronic kidney disease, stage 3 unspecified: Secondary | ICD-10-CM | POA: Diagnosis not present

## 2020-01-03 DIAGNOSIS — Z87891 Personal history of nicotine dependence: Secondary | ICD-10-CM | POA: Diagnosis not present

## 2020-01-03 DIAGNOSIS — Z91040 Latex allergy status: Secondary | ICD-10-CM | POA: Insufficient documentation

## 2020-01-03 DIAGNOSIS — R001 Bradycardia, unspecified: Secondary | ICD-10-CM | POA: Diagnosis not present

## 2020-01-03 DIAGNOSIS — I129 Hypertensive chronic kidney disease with stage 1 through stage 4 chronic kidney disease, or unspecified chronic kidney disease: Secondary | ICD-10-CM | POA: Diagnosis not present

## 2020-01-03 DIAGNOSIS — I959 Hypotension, unspecified: Secondary | ICD-10-CM | POA: Diagnosis not present

## 2020-01-03 DIAGNOSIS — E1122 Type 2 diabetes mellitus with diabetic chronic kidney disease: Secondary | ICD-10-CM | POA: Diagnosis not present

## 2020-01-03 DIAGNOSIS — Z7982 Long term (current) use of aspirin: Secondary | ICD-10-CM | POA: Insufficient documentation

## 2020-01-03 DIAGNOSIS — F0391 Unspecified dementia with behavioral disturbance: Secondary | ICD-10-CM | POA: Diagnosis not present

## 2020-01-03 DIAGNOSIS — Z8673 Personal history of transient ischemic attack (TIA), and cerebral infarction without residual deficits: Secondary | ICD-10-CM | POA: Diagnosis not present

## 2020-01-03 DIAGNOSIS — R4020 Unspecified coma: Secondary | ICD-10-CM | POA: Diagnosis not present

## 2020-01-03 DIAGNOSIS — R4182 Altered mental status, unspecified: Secondary | ICD-10-CM | POA: Diagnosis not present

## 2020-01-03 DIAGNOSIS — R0902 Hypoxemia: Secondary | ICD-10-CM | POA: Diagnosis not present

## 2020-01-03 DIAGNOSIS — R410 Disorientation, unspecified: Secondary | ICD-10-CM | POA: Diagnosis not present

## 2020-01-03 LAB — URINALYSIS, ROUTINE W REFLEX MICROSCOPIC
Bilirubin Urine: NEGATIVE
Glucose, UA: NEGATIVE mg/dL
Hgb urine dipstick: NEGATIVE
Ketones, ur: NEGATIVE mg/dL
Nitrite: POSITIVE — AB
Protein, ur: NEGATIVE mg/dL
Specific Gravity, Urine: 1.023 (ref 1.005–1.030)
WBC, UA: >50 WBC/hpf — ABNORMAL HIGH (ref 0–5)
pH: 5 (ref 5.0–8.0)

## 2020-01-03 LAB — CBC WITH DIFFERENTIAL/PLATELET
Abs Immature Granulocytes: 0.03 10*3/uL (ref 0.00–0.07)
Basophils Absolute: 0.1 10*3/uL (ref 0.0–0.1)
Basophils Relative: 1 %
Eosinophils Absolute: 0.4 10*3/uL (ref 0.0–0.5)
Eosinophils Relative: 5 %
HCT: 40.2 % (ref 36.0–46.0)
Hemoglobin: 12.8 g/dL (ref 12.0–15.0)
Immature Granulocytes: 0 %
Lymphocytes Relative: 33 %
Lymphs Abs: 3 10*3/uL (ref 0.7–4.0)
MCH: 29.9 pg (ref 26.0–34.0)
MCHC: 31.8 g/dL (ref 30.0–36.0)
MCV: 93.9 fL (ref 80.0–100.0)
Monocytes Absolute: 0.8 10*3/uL (ref 0.1–1.0)
Monocytes Relative: 9 %
Neutro Abs: 4.8 10*3/uL (ref 1.7–7.7)
Neutrophils Relative %: 52 %
Platelets: 234 10*3/uL (ref 150–400)
RBC: 4.28 MIL/uL (ref 3.87–5.11)
RDW: 13.2 % (ref 11.5–15.5)
WBC: 9.1 10*3/uL (ref 4.0–10.5)
nRBC: 0 % (ref 0.0–0.2)

## 2020-01-03 LAB — COMPREHENSIVE METABOLIC PANEL
ALT: 16 U/L (ref 0–44)
AST: 21 U/L (ref 15–41)
Albumin: 3.7 g/dL (ref 3.5–5.0)
Alkaline Phosphatase: 71 U/L (ref 38–126)
Anion gap: 8 (ref 5–15)
BUN: 34 mg/dL — ABNORMAL HIGH (ref 8–23)
CO2: 25 mmol/L (ref 22–32)
Calcium: 8.8 mg/dL — ABNORMAL LOW (ref 8.9–10.3)
Chloride: 106 mmol/L (ref 98–111)
Creatinine, Ser: 1.44 mg/dL — ABNORMAL HIGH (ref 0.44–1.00)
GFR calc Af Amer: 38 mL/min — ABNORMAL LOW (ref >60)
GFR calc non Af Amer: 33 mL/min — ABNORMAL LOW (ref >60)
Glucose, Bld: 108 mg/dL — ABNORMAL HIGH (ref 70–99)
Potassium: 4.3 mmol/L (ref 3.5–5.1)
Sodium: 139 mmol/L (ref 135–145)
Total Bilirubin: 0.5 mg/dL (ref 0.3–1.2)
Total Protein: 6.8 g/dL (ref 6.5–8.1)

## 2020-01-03 LAB — LACTIC ACID, PLASMA
Lactic Acid, Venous: 1.4 mmol/L (ref 0.5–1.9)
Lactic Acid, Venous: 0.6 mmol/L (ref 0.5–1.9)

## 2020-01-03 LAB — TROPONIN I (HIGH SENSITIVITY)
Troponin I (High Sensitivity): 2 ng/L (ref <18)
Troponin I (High Sensitivity): 2 ng/L (ref <18)

## 2020-01-03 LAB — CBG MONITORING, ED: Glucose-Capillary: 109 mg/dL — ABNORMAL HIGH (ref 70–99)

## 2020-01-03 LAB — TSH: TSH: 2.589 u[IU]/mL (ref 0.350–4.500)

## 2020-01-03 LAB — AMMONIA: Ammonia: 46 umol/L — ABNORMAL HIGH (ref 9–35)

## 2020-01-03 LAB — LIPASE, BLOOD: Lipase: 36 U/L (ref 11–51)

## 2020-01-03 MED ORDER — CEFTRIAXONE SODIUM 1 G IJ SOLR
1.0000 g | Freq: Once | INTRAMUSCULAR | Status: AC
  Administered 2020-01-04: 01:00:00 1 g via INTRAMUSCULAR
  Filled 2020-01-03: qty 10

## 2020-01-03 MED ORDER — CEPHALEXIN 500 MG PO CAPS: 500.0000 mg | ORAL_CAPSULE | Freq: Four times a day (QID) | ORAL | 0 refills | Status: DC

## 2020-01-03 NOTE — Discharge Instructions (Signed)
Your testing today shows no acute explanation for your confusion.  Follow-up with your doctor.  Return to the ED if you develop new or worsening symptoms.

## 2020-01-03 NOTE — ED Notes (Signed)
Pt called out wanting to know what she is doing here. I advised that the facility wanted her to be checked out so we were doing that and would get her back home as soon as we could. Pt requested BP cuff and IV in her left arm be taken off/out or at least adjusted because it "is very uncomfortable." Advised I would let the RN know.

## 2020-01-03 NOTE — ED Notes (Signed)
Pt mentioned wanting her IV taken out. Explained to pt that it needed to stay in place until provider says she is ready for discharge. Pt stated "I told that other girl to tell you." Informed pt that nurse was never informed of previous request for IV removal.

## 2020-01-03 NOTE — ED Provider Notes (Addendum)
[facility]-EMERGENCY DEPT Provider Note   CSN: [id number] Arrival date & time: 01/03/20  1906     History Chief Complaint  Patient presents with  . Altered Mental Status    [name] is a 86 y.o. female.  Level 5 caveat for confusion.  Patient brought from her facility with wandering behavior and confusion.  Facility reports she was wondering out of the building and trying to "give a plate to someone who was not there".  They report this is unusual behavior for her and denies any history of dementia.  Patient on arrival denies any complaints and does not know why she is here.  She is oriented to person and place.  She believes the year is 17. She denies any headache, chest pain, abdominal pain, nausea or vomiting.  No fever or recent illness.  Discussed with patient's daughter Cira Rue by phone.  She states patient does have a history of memory issues and that is why she is of the facility.  Earlier reports patient has had issues with trying to remember things and could not remember directions for driving.  She states it is not unusual for patient to not know the current year.  She states patient was diagnosed with dementia in the past but she is not certain the type.  Chart lists Alzheimer's dementia.  She does take Namenda. Arlene reports no recent known illnesses or medication changes. No previous history of psychiatric hospitalization  The history is provided by the patient, the EMS personnel, the nursing home and a relative.       Past Medical History:  Diagnosis Date  . Allergies   . Alzheimer disease (HCC)    STABLE  . Anemia, iron deficiency 12/12/2011  . Arthritis    RF  . CKD (chronic kidney disease)    STAGE 3  . Diabetes mellitus    no mes, diet only   . Diarrhea    RESOLVED  . Hypercholesterolemia   . Hyperlipemia   . Hypertension   . Major depression   . OA (osteoarthritis)    HAND/LEFT KNEE PAIN MILD  . Osteoporosis   . Syncope      Patient Active Problem List   Diagnosis Date Noted  . History of syncope 08/13/2019  . Syncope and collapse 01/23/2019  . Leukocytosis 01/23/2019  . Diarrhea 01/23/2019  . Prolonged QT interval 01/23/2019  . Mild cognitive impairment 07/15/2015  . Acute confusional state 06/19/2015  . Memory loss 06/19/2015  . DM II (diabetes mellitus, type II), controlled (HCC) 05/14/2015  . Hyperlipidemia 05/14/2015  . Borderline diabetes mellitus   . Essential hypertension, benign   . TIA (transient ischemic attack) 05/13/2015  . Anemia, iron deficiency 12/12/2011  . Humerus fracture 12/11/2011  . Hypoxia 12/11/2011  . Wide-complex tachycardia (HCC) 12/11/2011  . Acute hypotension 12/11/2011    Past Surgical History:  Procedure Laterality Date  . left knee arthroscopy    . ORIF HUMERUS FRACTURE  12/09/2011   Procedure: OPEN REDUCTION INTERNAL FIXATION (ORIF) DISTAL HUMERUS FRACTURE;  Surgeon: Sharma Covert, MD;  Location: [facility];  Service: Orthopedics;  Laterality: Right;  . OTHER SURGICAL HISTORY     left small toe bone spur removed   . TONSILLECTOMY       OB History   No obstetric history on file.     Family History  Problem Relation Age of Onset  . Heart Problems Mother   . Heart attack Maternal Grandmother  died from a "heart attack"  . Stroke Neg Hx   . Arrhythmia Neg Hx     Social History   Tobacco Use  . Smoking status: Former Smoker    Types: Cigarettes    Quit date: 12/29/2009    Years since quitting: 10.0  . Smokeless tobacco: Never Used  Substance Use Topics  . Alcohol use: No    Alcohol/week: 0.0 standard drinks  . Drug use: No    Home Medications Prior to Admission medications   Medication Sig Start Date End Date Taking? Authorizing Provider  acetaminophen (MAPAP) 500 MG tablet Take 500 mg by mouth 3 (three) times daily as needed for mild pain.     [provider]  aspirin EC 81 MG EC tablet Take 1 tablet (81 mg total) by mouth daily.  05/15/15   Joseph Art, DO  Calcium Carbonate-Vitamin D3 (CALCIUM 600-D) 600-400 MG-UNIT TABS Take 2 tablets by mouth every morning.    [provider]  docusate sodium (COLACE) 100 MG capsule Take 1 capsule (100 mg total) by mouth every 12 (twelve) hours. 12/27/16   Ward, Layla Maw, DO  lisinopril (PRINIVIL,ZESTRIL) 10 MG tablet Take 10 mg by mouth daily.    [provider]  Melatonin 3 MG TABS Take 3 mg by mouth at bedtime.    [provider]  meloxicam (MOBIC) 15 MG tablet Take 15 mg by mouth daily.    [provider]  memantine (NAMENDA) 10 MG tablet Take 10 mg by mouth 2 (two) times daily.    [provider]  metoprolol tartrate (LOPRESSOR) 25 MG tablet Take 25 mg by mouth 2 (two) times daily.    [provider]  sertraline (ZOLOFT) 100 MG tablet Take 100 mg by mouth at bedtime.    [provider]  simvastatin (ZOCOR) 20 MG tablet Take 1 tablet (20 mg total) by mouth daily at 6 PM. 05/15/15   Joseph Art, DO  vitamin B-12 (CYANOCOBALAMIN) 1000 MCG tablet Take 1,000 mcg by mouth 2 (two) times daily.    [provider]    Allergies    Sulfa antibiotics and Latex  Review of Systems   Review of Systems  Unable to perform ROS: Dementia    Physical Exam Updated Vital Signs BP 123/66   Pulse (!) 55   Temp 98.5 F (36.9 C) (Oral)   Resp 12   SpO2 95%   Physical Exam Vitals and nursing note reviewed.  Constitutional:      General: She is not in acute distress.    Appearance: She is well-developed. She is obese.     Comments: Calm and cooperative, no distress  HENT:     Head: Normocephalic and atraumatic.     Mouth/Throat:     Pharynx: No oropharyngeal exudate.  Eyes:     Conjunctiva/sclera: Conjunctivae normal.     Pupils: Pupils are equal, round, and reactive to light.  Neck:     Comments: No meningismus. Cardiovascular:     Rate and Rhythm: Normal rate and regular rhythm.     Heart sounds: Normal  heart sounds. No murmur.  Pulmonary:     Effort: Pulmonary effort is normal. No respiratory distress.     Breath sounds: Normal breath sounds.  Abdominal:     Palpations: Abdomen is soft.     Tenderness: There is no abdominal tenderness. There is no guarding or rebound.  Musculoskeletal:        General: No tenderness. Normal  range of motion.     Cervical back: Normal range of motion and neck supple.  Skin:    General: Skin is warm.  Neurological:     Mental Status: She is alert.     Cranial Nerves: No cranial nerve deficit.     Motor: No abnormal muscle tone.     Coordination: Coordination normal.     Comments:  5/5 strength throughout. CN 2-12 intact.Equal grip strength.   Oriented to person and place.  Believes the year is 7.  Follows commands and answers questions appropriately.  Psychiatric:        Behavior: Behavior normal.     ED Results / Procedures / Treatments   Labs (all labs ordered are listed, but only abnormal results are displayed) Labs Reviewed  COMPREHENSIVE METABOLIC PANEL - Abnormal; Notable for the following components:      Result Value   Glucose, Bld 108 (*)    BUN 34 (*)    Creatinine, Ser 1.44 (*)    Calcium 8.8 (*)    GFR calc non Af Amer 33 (*)    GFR calc Af Amer 38 (*)    All other components within normal limits  AMMONIA - Abnormal; Notable for the following components:   Ammonia 46 (*)    All other components within normal limits  CBG MONITORING, ED - Abnormal; Notable for the following components:   Glucose-Capillary 109 (*)    All other components within normal limits  CBC WITH DIFFERENTIAL/PLATELET  LIPASE, BLOOD  LACTIC ACID, PLASMA  TSH  URINALYSIS, ROUTINE W REFLEX MICROSCOPIC  LACTIC ACID, PLASMA  TROPONIN I (HIGH SENSITIVITY)  TROPONIN I (HIGH SENSITIVITY)    EKG EKG Interpretation  Date/Time:  Friday January 03 2020 19:32:54 EST Ventricular Rate:  56 PR Interval:    QRS Duration: 97 QT Interval:  440 QTC  Calculation: 425 R Axis:   39 Text Interpretation: Sinus rhythm Low voltage, precordial leads No significant change was found Confirmed by Glynn Octave [phone]) on 01/03/2020 7:39:42 PM   Radiology DG Chest 2 View  Result Date: 01/03/2020 CLINICAL DATA:  Altered mental status EXAM: CHEST - 2 VIEW COMPARISON:  09/07/2019 FINDINGS: Heart and mediastinal contours are within normal limits. No focal opacities or effusions. No acute bony abnormality. IMPRESSION: Normal study. Electronically Signed   By: Charlett Nose M.D.   On: 01/03/2020 20:14   CT Head Wo Contrast  Result Date: 01/03/2020 CLINICAL DATA:  Altered level of consciousness, confusion EXAM: CT HEAD WITHOUT CONTRAST TECHNIQUE: Contiguous axial images were obtained from the base of the skull through the vertex without intravenous contrast. COMPARISON:  09/07/2019 FINDINGS: Brain: Chronic small vessel ischemic changes are seen within the periventricular white matter. No acute infarct or hemorrhage. Lateral ventricles and midline structures are stable. No acute extra-axial fluid collections. No mass effect. Vascular: No hyperdense vessel or unexpected calcification. Skull: Normal. Negative for fracture or focal lesion. Sinuses/Orbits: No acute finding. Other: None IMPRESSION: 1. Stable chronic small vessel ischemic changes. No acute intracranial process. Electronically Signed   By: Sharlet Salina M.D.   On: 01/03/2020 20:22    Procedures Procedures (including critical care time)  Medications Ordered in ED Medications - No data to display  ED Course  I have reviewed the triage vital signs and the nursing notes.  Pertinent labs & imaging results that were available during my care of the patient were reviewed by me and considered in my medical decision making (see chart for details).  MDM Rules/Calculators/A&P                      Patient sent from facility with confusion with her behavior.  She is mildly confused on arrival.  Chart  review does show history of dementia.  Patient's daughter states patient was diagnosed with dementia in the past and has memory issues.  Altered mental status work-up early unremarkable.  CT head is stable.  Chest x-ray is negative.  Labs appear to be at baseline.  Patient is able to ambulate and tolerate p.o.  She has some mild confusion but appears to be at her baseline.  Discussed with her daughter who does state that she has memory issues.  Patient denies any suicidal thoughts or homicidal thoughts.  She denies hearing any voices.  Awaiting urinalysis.  Anticipate discharge back to her facility. Care to be transferred to oncoming team. Final Clinical Impression(s) / ED Diagnoses Final diagnoses:  Confusion  Dementia with behavioral disturbance, unspecified dementia type [facility])    Rx / DC Orders ED Discharge Orders    None       Avaleigh Decuir, Annie Main, MD 01/03/20 3559    Ezequiel Essex, MD 01/03/20 2311

## 2020-01-03 NOTE — ED Triage Notes (Addendum)
Pt arriving via [facility] from [place] at [facility] for confusion. Per facility report, pt attempted to wander into the parking lot to feed a person that wasn't actually there. Facility reports behavior is new, but pt has diagnosis of Alzheimer's. Pt alert to self, place, and date. Only confused about situation and does not understand why she is here.

## 2020-01-03 NOTE — ED Notes (Signed)
Patient transported to X-ray 

## 2020-01-03 NOTE — ED Notes (Signed)
Pt ambulated to restroom without need for assistance.

## 2020-01-04 DIAGNOSIS — M2550 Pain in unspecified joint: Secondary | ICD-10-CM | POA: Diagnosis not present

## 2020-01-04 DIAGNOSIS — Z7401 Bed confinement status: Secondary | ICD-10-CM | POA: Diagnosis not present

## 2020-01-04 DIAGNOSIS — R410 Disorientation, unspecified: Secondary | ICD-10-CM | POA: Diagnosis not present

## 2020-01-04 MED ORDER — LIDOCAINE HCL 1 % IJ SOLN
INTRAMUSCULAR | Status: AC
  Administered 2020-01-04: 1 mL
  Filled 2020-01-04: qty 20

## 2020-01-04 NOTE — ED Notes (Signed)
[facility] contacted for discharge transport back to [facility]

## 2020-01-04 NOTE — ED Notes (Signed)
Unable to reach [facility] at [facility] to give report on patient. [facility] at bedside to transport patient back to facility.

## 2020-01-07 DIAGNOSIS — Z20828 Contact with and (suspected) exposure to other viral communicable diseases: Secondary | ICD-10-CM | POA: Diagnosis not present

## 2020-01-14 DIAGNOSIS — Z20828 Contact with and (suspected) exposure to other viral communicable diseases: Secondary | ICD-10-CM | POA: Diagnosis not present

## 2020-01-26 DIAGNOSIS — Z23 Encounter for immunization: Secondary | ICD-10-CM | POA: Diagnosis not present

## 2020-02-12 DIAGNOSIS — Z20828 Contact with and (suspected) exposure to other viral communicable diseases: Secondary | ICD-10-CM | POA: Diagnosis not present

## 2020-02-14 DIAGNOSIS — N390 Urinary tract infection, site not specified: Secondary | ICD-10-CM | POA: Diagnosis not present

## 2020-02-14 DIAGNOSIS — N179 Acute kidney failure, unspecified: Secondary | ICD-10-CM | POA: Diagnosis not present

## 2020-02-14 DIAGNOSIS — Z79899 Other long term (current) drug therapy: Secondary | ICD-10-CM | POA: Diagnosis not present

## 2020-02-14 DIAGNOSIS — R319 Hematuria, unspecified: Secondary | ICD-10-CM | POA: Diagnosis not present

## 2020-02-18 DIAGNOSIS — Z20828 Contact with and (suspected) exposure to other viral communicable diseases: Secondary | ICD-10-CM | POA: Diagnosis not present

## 2020-02-25 DIAGNOSIS — Z03818 Encounter for observation for suspected exposure to other biological agents ruled out: Secondary | ICD-10-CM | POA: Diagnosis not present

## 2020-02-25 DIAGNOSIS — Z20828 Contact with and (suspected) exposure to other viral communicable diseases: Secondary | ICD-10-CM | POA: Diagnosis not present

## 2020-06-05 DIAGNOSIS — R410 Disorientation, unspecified: Secondary | ICD-10-CM | POA: Diagnosis not present

## 2020-06-29 DIAGNOSIS — Z20828 Contact with and (suspected) exposure to other viral communicable diseases: Secondary | ICD-10-CM | POA: Diagnosis not present

## 2020-06-29 DIAGNOSIS — Z03818 Encounter for observation for suspected exposure to other biological agents ruled out: Secondary | ICD-10-CM | POA: Diagnosis not present

## 2020-07-06 DIAGNOSIS — Z20828 Contact with and (suspected) exposure to other viral communicable diseases: Secondary | ICD-10-CM | POA: Diagnosis not present

## 2020-07-07 DIAGNOSIS — Z20828 Contact with and (suspected) exposure to other viral communicable diseases: Secondary | ICD-10-CM | POA: Diagnosis not present

## 2020-07-17 DIAGNOSIS — Z03818 Encounter for observation for suspected exposure to other biological agents ruled out: Secondary | ICD-10-CM | POA: Diagnosis not present

## 2020-07-17 DIAGNOSIS — Z20828 Contact with and (suspected) exposure to other viral communicable diseases: Secondary | ICD-10-CM | POA: Diagnosis not present

## 2020-07-24 DIAGNOSIS — Z20828 Contact with and (suspected) exposure to other viral communicable diseases: Secondary | ICD-10-CM | POA: Diagnosis not present

## 2020-09-24 DIAGNOSIS — Z23 Encounter for immunization: Secondary | ICD-10-CM | POA: Diagnosis not present

## 2020-10-10 ENCOUNTER — Emergency Department (HOSPITAL_COMMUNITY): Payer: Medicare Other

## 2020-10-10 ENCOUNTER — Other Ambulatory Visit: Payer: Self-pay

## 2020-10-10 ENCOUNTER — Emergency Department (HOSPITAL_COMMUNITY)
Admission: EM | Admit: 2020-10-10 | Discharge: 2020-10-10 | Disposition: A | Discharge status: Home / Self Care | Payer: Medicare Other | Attending: Emergency Medicine | Admitting: Emergency Medicine

## 2020-10-10 ENCOUNTER — Encounter (HOSPITAL_COMMUNITY): Payer: Self-pay | Admitting: Emergency Medicine

## 2020-10-10 DIAGNOSIS — Z043 Encounter for examination and observation following other accident: Secondary | ICD-10-CM | POA: Diagnosis not present

## 2020-10-10 DIAGNOSIS — W228XXA Striking against or struck by other objects, initial encounter: Secondary | ICD-10-CM | POA: Diagnosis not present

## 2020-10-10 DIAGNOSIS — Z87891 Personal history of nicotine dependence: Secondary | ICD-10-CM | POA: Diagnosis not present

## 2020-10-10 DIAGNOSIS — E1122 Type 2 diabetes mellitus with diabetic chronic kidney disease: Secondary | ICD-10-CM | POA: Insufficient documentation

## 2020-10-10 DIAGNOSIS — Y92830 Public park as the place of occurrence of the external cause: Secondary | ICD-10-CM | POA: Insufficient documentation

## 2020-10-10 DIAGNOSIS — F0390 Unspecified dementia without behavioral disturbance: Secondary | ICD-10-CM | POA: Insufficient documentation

## 2020-10-10 DIAGNOSIS — Z79899 Other long term (current) drug therapy: Secondary | ICD-10-CM | POA: Insufficient documentation

## 2020-10-10 DIAGNOSIS — I129 Hypertensive chronic kidney disease with stage 1 through stage 4 chronic kidney disease, or unspecified chronic kidney disease: Secondary | ICD-10-CM | POA: Diagnosis not present

## 2020-10-10 DIAGNOSIS — S0990XA Unspecified injury of head, initial encounter: Secondary | ICD-10-CM | POA: Insufficient documentation

## 2020-10-10 DIAGNOSIS — Z7982 Long term (current) use of aspirin: Secondary | ICD-10-CM | POA: Insufficient documentation

## 2020-10-10 DIAGNOSIS — W19XXXA Unspecified fall, initial encounter: Secondary | ICD-10-CM | POA: Diagnosis not present

## 2020-10-10 DIAGNOSIS — N183 Chronic kidney disease, stage 3 unspecified: Secondary | ICD-10-CM | POA: Diagnosis not present

## 2020-10-10 DIAGNOSIS — Z743 Need for continuous supervision: Secondary | ICD-10-CM | POA: Diagnosis not present

## 2020-10-10 DIAGNOSIS — R220 Localized swelling, mass and lump, head: Secondary | ICD-10-CM | POA: Diagnosis not present

## 2020-10-10 DIAGNOSIS — Z91040 Latex allergy status: Secondary | ICD-10-CM | POA: Insufficient documentation

## 2020-10-10 DIAGNOSIS — R0902 Hypoxemia: Secondary | ICD-10-CM | POA: Diagnosis not present

## 2020-10-10 DIAGNOSIS — R279 Unspecified lack of coordination: Secondary | ICD-10-CM | POA: Diagnosis not present

## 2020-10-10 MED ORDER — ACETAMINOPHEN 325 MG PO TABS: 325.0000 mg | ORAL_TABLET | Freq: Four times a day (QID) | ORAL | 0 refills | Status: DC | PRN

## 2020-10-10 NOTE — ED Triage Notes (Signed)
Pt presents via [facility] from [facility] at [facility] after an unwitnessed fall. Hx of alzheimer's disease. No blood thinners. Hematoma on back of head. Alert per norm per facility.

## 2020-10-10 NOTE — ED Provider Notes (Signed)
[facility]-EMERGENCY DEPT Provider Note   CSN: [id number] Arrival date & time: 10/10/20  1840     History Chief Complaint  Patient presents with  . Fall  . Head Injury    [name] is a 86 y.o. female.  HPI Patient is an 86 year old female with a history of Alzheimer's disease who presents to the emergency department via [facility].  Patient experienced an unwitnessed fall.  Patient unable to provide any details regarding the fall but states the back of her head hurts.  Patient was discussed with the nursing staff at her facility who states that patient fell backwards and they believe she struck her posterior head on the wall.  She is not on anticoagulants, per nursing note.  Patient has no other acute complaints at this time.  Nursing staff confirms that she was behaving at her baseline both before and after the fall.  No recent illnesses.    Past Medical History:  Diagnosis Date  . Allergies   . Alzheimer disease (HCC)    STABLE  . Anemia, iron deficiency 12/12/2011  . Arthritis    RF  . CKD (chronic kidney disease)    STAGE 3  . Diabetes mellitus    no mes, diet only   . Diarrhea    RESOLVED  . Hypercholesterolemia   . Hyperlipemia   . Hypertension   . Major depression   . OA (osteoarthritis)    HAND/LEFT KNEE PAIN MILD  . Osteoporosis   . Syncope     Patient Active Problem List   Diagnosis Date Noted  . History of syncope 08/13/2019  . Syncope and collapse 01/23/2019  . Leukocytosis 01/23/2019  . Diarrhea 01/23/2019  . Prolonged QT interval 01/23/2019  . Mild cognitive impairment 07/15/2015  . Acute confusional state 06/19/2015  . Memory loss 06/19/2015  . DM II (diabetes mellitus, type II), controlled (HCC) 05/14/2015  . Hyperlipidemia 05/14/2015  . Borderline diabetes mellitus   . Essential hypertension, benign   . TIA (transient ischemic attack) 05/13/2015  . Anemia, iron deficiency 12/12/2011    . Humerus fracture 12/11/2011  . Hypoxia 12/11/2011  . Wide-complex tachycardia (HCC) 12/11/2011  . Acute hypotension 12/11/2011    Past Surgical History:  Procedure Laterality Date  . left knee arthroscopy    . ORIF HUMERUS FRACTURE  12/09/2011   Procedure: OPEN REDUCTION INTERNAL FIXATION (ORIF) DISTAL HUMERUS FRACTURE;  Surgeon: Sharma Covert, MD;  Location: [facility];  Service: Orthopedics;  Laterality: Right;  . OTHER SURGICAL HISTORY     left small toe bone spur removed   . TONSILLECTOMY       OB History   No obstetric history on file.     Family History  Problem Relation Age of Onset  . Heart Problems Mother   . Heart attack Maternal Grandmother        died from a "heart attack"  . Stroke Neg Hx   . Arrhythmia Neg Hx     Social History   Tobacco Use  . Smoking status: Former Smoker    Types: Cigarettes    Quit date: 12/29/2009    Years since quitting: 10.7  . Smokeless tobacco: Never Used  Vaping Use  . Vaping Use: Never used  Substance Use Topics  . Alcohol use: No    Alcohol/week: 0.0 standard drinks  . Drug use: No    Home Medications Prior to Admission medications   Medication Sig Start  Date End Date Taking? Authorizing Provider  acetaminophen (MAPAP) 500 MG tablet Take 500 mg by mouth 3 (three) times daily as needed for mild pain.     [provider]  aspirin EC 81 MG EC tablet Take 1 tablet (81 mg total) by mouth daily. 05/15/15   Joseph ArtVann, Jessica U, DO  Calcium Carbonate-Vitamin D3 (CALCIUM 600-D) 600-400 MG-UNIT TABS Take 2 tablets by mouth every morning.    [provider]  cephALEXin (KEFLEX) 500 MG capsule Take 1 capsule (500 mg total) by mouth 4 (four) times daily. 01/03/20   Palumbo, April, MD  docusate sodium (COLACE) 100 MG capsule Take 1 capsule (100 mg total) by mouth every 12 (twelve) hours. 12/27/16   Ward, Layla MawKristen N, DO  lisinopril (PRINIVIL,ZESTRIL) 10 MG tablet Take 10 mg by mouth daily.    [provider]  Melatonin  3 MG TABS Take 3 mg by mouth at bedtime.    [provider]  meloxicam (MOBIC) 15 MG tablet Take 15 mg by mouth daily.    [provider]  memantine (NAMENDA) 10 MG tablet Take 10 mg by mouth 2 (two) times daily.    [provider]  metoprolol tartrate (LOPRESSOR) 25 MG tablet Take 25 mg by mouth 2 (two) times daily.    [provider]  sertraline (ZOLOFT) 100 MG tablet Take 100 mg by mouth at bedtime.    [provider]  simvastatin (ZOCOR) 20 MG tablet Take 1 tablet (20 mg total) by mouth daily at 6 PM. 05/15/15   Joseph ArtVann, Jessica U, DO  vitamin B-12 (CYANOCOBALAMIN) 1000 MCG tablet Take 1,000 mcg by mouth 2 (two) times daily.    [provider]    Allergies    Sulfa antibiotics and Latex  Review of Systems   Review of Systems  Unable to perform ROS: Dementia   Physical Exam Updated Vital Signs BP (!) 134/53 (BP Location: Right Arm)   Pulse 60   Temp 97.8 F (36.6 C) (Oral)   Resp 18   SpO2 94%   Physical Exam Vitals and nursing note reviewed.  Constitutional:      General: She is not in acute distress.    Appearance: Normal appearance. She is well-developed and normal weight. She is not ill-appearing, toxic-appearing or diaphoretic.  HENT:     Head: Normocephalic.     Comments: Mild edema noted to the posterior scalp. No overlying bruising, redness, abrasions or lacerations.     Right Ear: External ear normal.     Left Ear: External ear normal.     Nose: Nose normal.  Eyes:     General: No scleral icterus.       Right eye: No discharge.        Left eye: No discharge.     Conjunctiva/sclera: Conjunctivae normal.  Neck:     Trachea: No tracheal deviation.     Comments: No midline C, T, or L-spine tenderness.  No step-off, crepitus, deformities. Cardiovascular:     Rate and Rhythm: Normal rate and regular rhythm.     Pulses: Normal pulses.     Heart sounds: Normal heart sounds. No murmur heard.  No friction rub. No  gallop.   Pulmonary:     Effort: Pulmonary effort is normal. No respiratory distress.     Breath sounds: Normal breath sounds. No stridor.  Abdominal:     General: Abdomen is flat. There is no distension.     Palpations: Abdomen is soft.  Tenderness: There is no abdominal tenderness.     Comments: Abdomen is soft and nontender in all 4 quadrants.  No bruising or signs of trauma.  Musculoskeletal:        General: No swelling or deformity.     Cervical back: Normal range of motion and neck supple. No tenderness.     Comments: No pain appreciated with manipulation of the pelvis.  Skin:    General: Skin is warm and dry.     Findings: No rash.     Comments: No bruising or signs of trauma noted to the chest, abdomen, back, or extremities.  Neurological:     Mental Status: She is alert. Mental status is at baseline.     Cranial Nerves: Cranial nerve deficit: no gross deficits.     Comments: Patient speaks clearly and is pleasant to converse with.  Moving all 4 extremities with ease.  Distal sensation intact.    ED Results / Procedures / Treatments   Labs (all labs ordered are listed, but only abnormal results are displayed) Labs Reviewed - No data to display  EKG None  Radiology CT Head Wo Contrast  Result Date: 10/10/2020 CLINICAL DATA:  Status post fall. EXAM: CT HEAD WITHOUT CONTRAST TECHNIQUE: Contiguous axial images were obtained from the base of the skull through the vertex without intravenous contrast. COMPARISON:  January 03, 2020 FINDINGS: Brain: There is mild cerebral atrophy with widening of the extra-axial spaces and ventricular dilatation. There are areas of decreased attenuation within the white matter tracts of the supratentorial brain, consistent with microvascular disease changes. Vascular: No hyperdense vessel or unexpected calcification. Skull: Normal. Negative for fracture or focal lesion. Sinuses/Orbits: No acute finding. Other: Mild scalp soft tissue swelling is  seen along the posterior aspect of the vertex on the right. IMPRESSION: Mild scalp soft tissue swelling along the posterior aspect of the vertex on the right, without evidence of an acute fracture or acute intracranial abnormality. Electronically Signed   By: Aram Candela M.D.   On: 10/10/2020 20:41   CT Cervical Spine Wo Contrast  Result Date: 10/10/2020 CLINICAL DATA:  Status post fall. EXAM: CT CERVICAL SPINE WITHOUT CONTRAST TECHNIQUE: Multidetector CT imaging of the cervical spine was performed without intravenous contrast. Multiplanar CT image reconstructions were also generated. COMPARISON:  None. FINDINGS: Alignment: Approximately 2 mm retrolisthesis of the C4 vertebral body is seen on C5. Skull base and vertebrae: No acute fracture. No primary bone lesion or focal pathologic process. Soft tissues and spinal canal: No prevertebral fluid or swelling. No visible canal hematoma. Disc levels: Moderate severity endplate sclerosis is seen at the levels of C3-C4, C5-C6 and C6-C7. Moderate severity intervertebral disc space narrowing is also seen at these levels. Moderate severity bilateral multilevel facet joint hypertrophy is noted Upper chest: Negative. Other: None. IMPRESSION: 1. Moderate severity degenerative changes of the cervical spine, as described above. 2. Approximately 2 mm retrolisthesis of the C4 vertebral body on C5. 3. No evidence of an acute fracture or subluxation. Electronically Signed   By: Aram Candela M.D.   On: 10/10/2020 20:30    Procedures Procedures (including critical care time)  Medications Ordered in ED Medications - No data to display  ED Course  I have reviewed the triage vital signs and the nursing notes.  Pertinent labs & imaging results that were available during my care of the patient were reviewed by me and considered in my medical decision making (see chart for details).  Clinical Course as of  Oct 11 2151  Sat Oct 10, 2020  2133 Mild scalp soft  tissue swelling along the posterior aspect of the vertex on the right, without evidence of an acute fracture or acute intracranial abnormality.  CT Head Wo Contrast [LJ]  2140 1. Moderate severity degenerative changes of the cervical spine, as described above. 2. Approximately 2 mm retrolisthesis of the C4 vertebral body on C5. 3. No evidence of an acute fracture or subluxation.  CT Cervical Spine Wo Contrast [LJ]  2140 Mild scalp soft tissue swelling along the posterior aspect of the vertex on the right, without evidence of an acute fracture or acute intracranial abnormality.  CT Head Wo Contrast [LJ]    Clinical Course User Index [LJ] Placido Sou, PA-C   MDM Rules/Calculators/A&P                          Patient presents from her nursing home due to an unwitnessed fall.  She has a small amount of edema to the posterior scalp with mild pain overlying the site but otherwise no visible or palpable signs of trauma.  I obtained a CT scan of her head and neck.  CT scan of her head shows some mild soft tissue swelling along the right posterior aspect of the vertex on the right without evidence of acute fracture or acute intracranial abnormalities.  CT scan of the cervical spine shows moderate severity degenerative changes of the cervical spine.  2 mm of retrolisthesis of the C4 vertebral body on C5.  No evidence of an acute fracture or subluxation.  Patient reassessed and has no palpable pain on the midline cervical spine.  She is moving all 4 extremities equally and with ease.  Strength is intact.  No gross deficits.    Patient discussed with and evaluated by my attending physician Dr. Dalene Seltzer.  We feel that patient is stable for discharge.  Patient given a prescription for a short course of APAP at the request of the nursing staff at her facility.  Final Clinical Impression(s) / ED Diagnoses Final diagnoses:  Fall, initial encounter  Injury of head, initial encounter    Rx / DC  Orders ED Discharge Orders         Ordered    acetaminophen (TYLENOL) 325 MG tablet  Every 6 hours PRN        10/10/20 2159           Placido Sou, PA-C 10/10/20 2208    Alvira Monday, MD 10/12/20 [phone]

## 2020-10-10 NOTE — Discharge Instructions (Signed)
Bring [name] back to the emergency department if she develops any new or worsening symptoms.

## 2020-10-13 DIAGNOSIS — Z23 Encounter for immunization: Secondary | ICD-10-CM | POA: Diagnosis not present

## 2020-10-17 IMAGING — CR DG CHEST 2V
2 series · 2 of 2 positions shown · non-contrast
Comparison: 09/07/2019

CLINICAL DATA: Altered mental status

EXAM:
CHEST - 2 VIEW

[w chest pa]
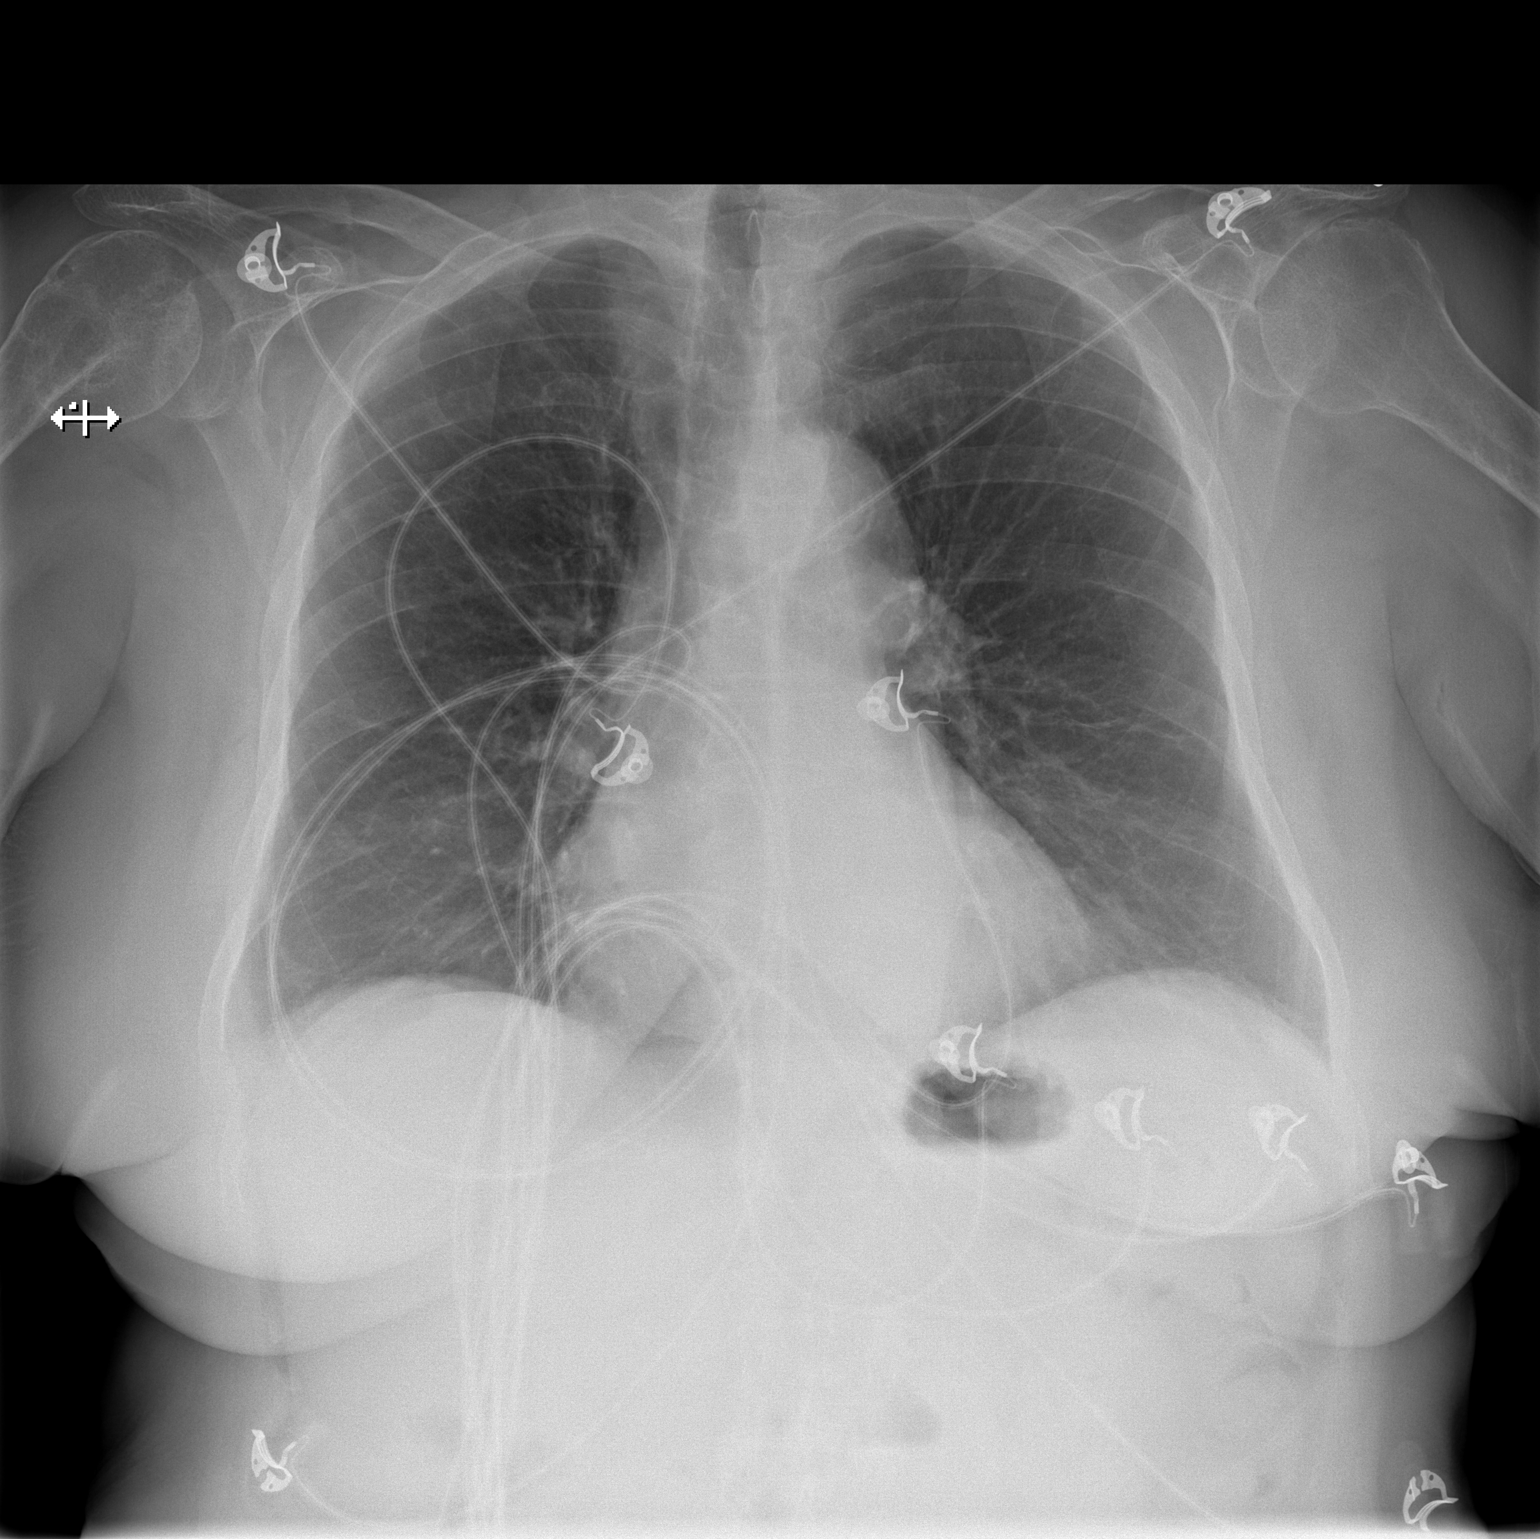

[w chest lat]
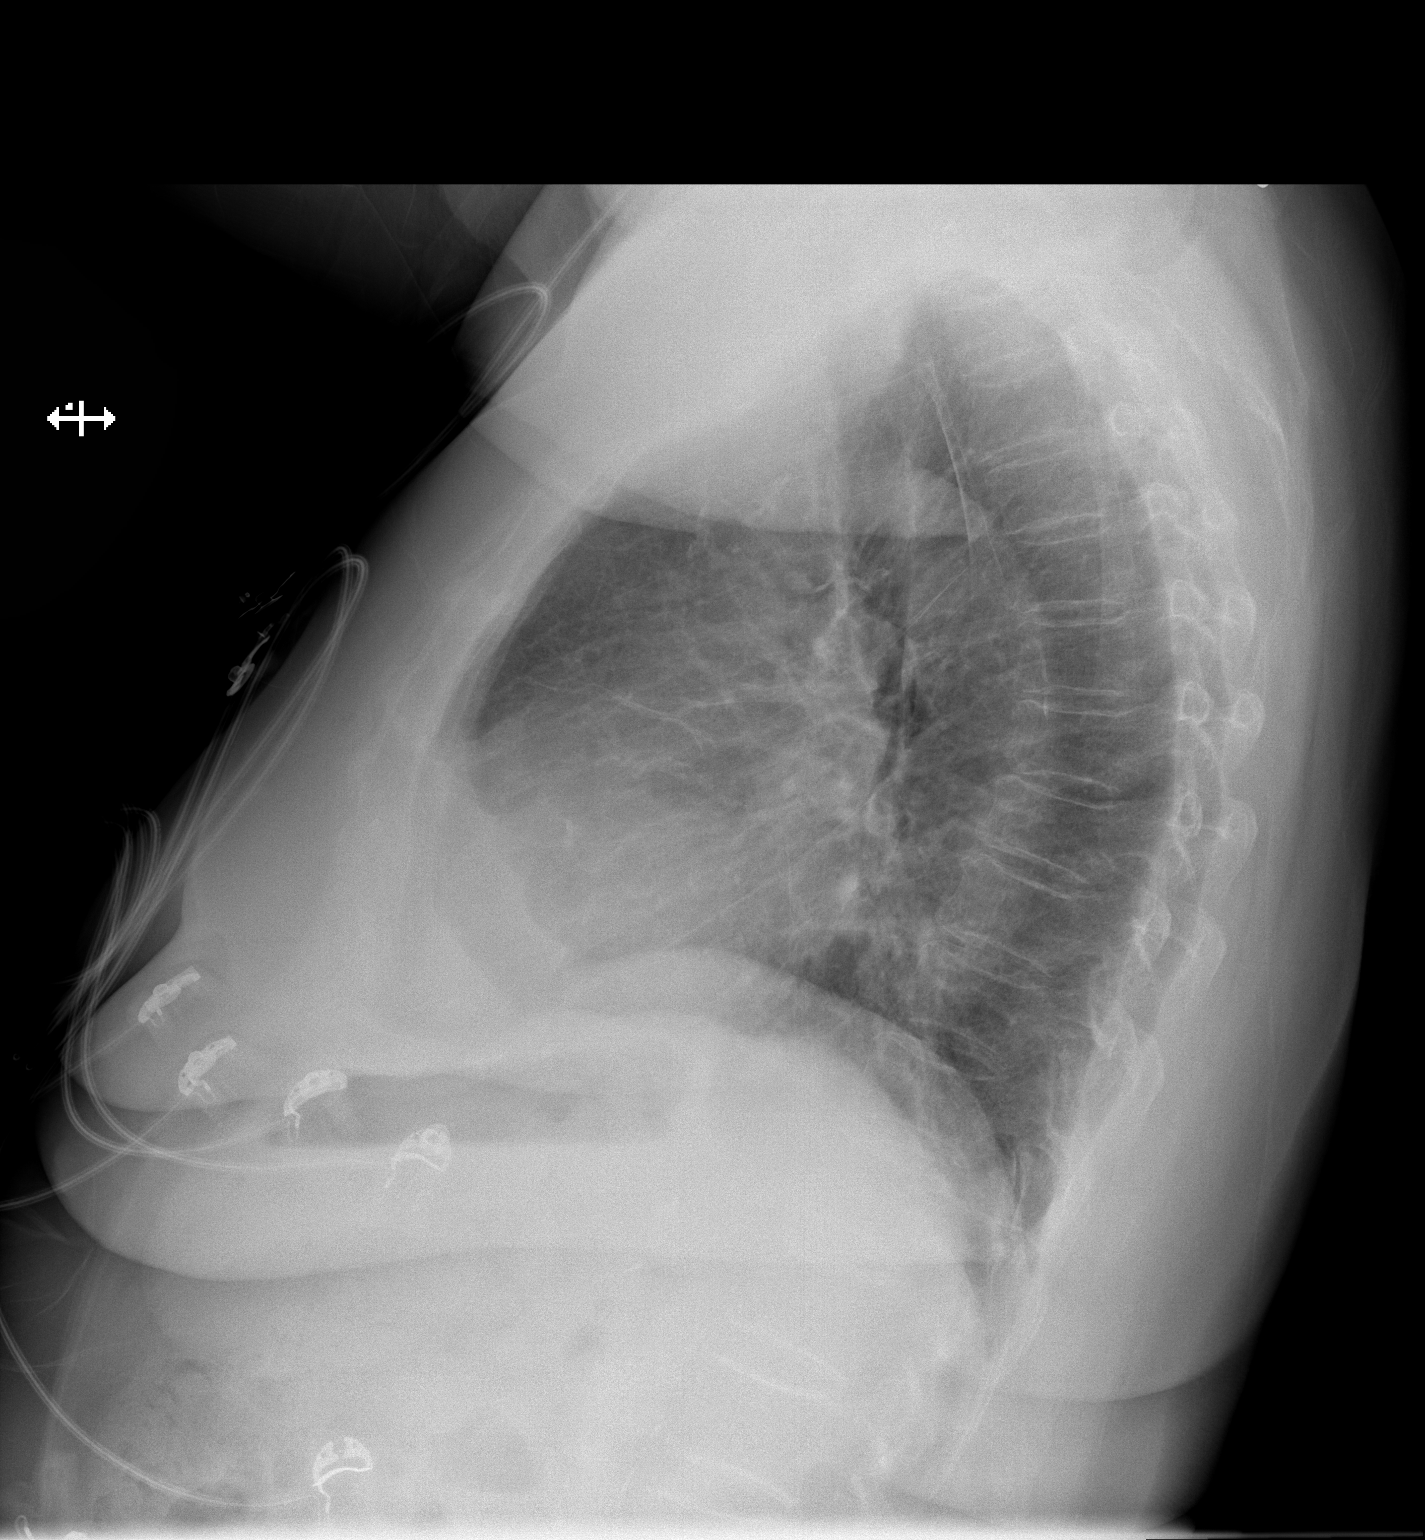

[2 of 2 positions shown; findings below may reference images not displayed]

FINDINGS: Heart and mediastinal contours are within normal limits. No focal
opacities or effusions. No acute bony abnormality.
IMPRESSION: Normal study.

## 2021-03-19 ENCOUNTER — Emergency Department (HOSPITAL_COMMUNITY)
Admission: EM | Admit: 2021-03-19 | Discharge: 2021-03-20 | Disposition: A | Discharge status: Home / Self Care | Payer: Medicare Other | Attending: Emergency Medicine | Admitting: Emergency Medicine

## 2021-03-19 ENCOUNTER — Other Ambulatory Visit: Payer: Self-pay

## 2021-03-19 ENCOUNTER — Encounter (HOSPITAL_COMMUNITY): Payer: Self-pay

## 2021-03-19 ENCOUNTER — Emergency Department (HOSPITAL_COMMUNITY): Payer: Medicare Other

## 2021-03-19 DIAGNOSIS — S0083XA Contusion of other part of head, initial encounter: Secondary | ICD-10-CM | POA: Diagnosis not present

## 2021-03-19 DIAGNOSIS — E1122 Type 2 diabetes mellitus with diabetic chronic kidney disease: Secondary | ICD-10-CM | POA: Insufficient documentation

## 2021-03-19 DIAGNOSIS — Z7982 Long term (current) use of aspirin: Secondary | ICD-10-CM | POA: Insufficient documentation

## 2021-03-19 DIAGNOSIS — S0012XA Contusion of left eyelid and periocular area, initial encounter: Secondary | ICD-10-CM | POA: Insufficient documentation

## 2021-03-19 DIAGNOSIS — Z79899 Other long term (current) drug therapy: Secondary | ICD-10-CM | POA: Insufficient documentation

## 2021-03-19 DIAGNOSIS — S0990XA Unspecified injury of head, initial encounter: Secondary | ICD-10-CM

## 2021-03-19 DIAGNOSIS — W19XXXA Unspecified fall, initial encounter: Secondary | ICD-10-CM | POA: Diagnosis not present

## 2021-03-19 DIAGNOSIS — Z87891 Personal history of nicotine dependence: Secondary | ICD-10-CM | POA: Insufficient documentation

## 2021-03-19 DIAGNOSIS — S8002XA Contusion of left knee, initial encounter: Secondary | ICD-10-CM

## 2021-03-19 DIAGNOSIS — Z91040 Latex allergy status: Secondary | ICD-10-CM | POA: Insufficient documentation

## 2021-03-19 DIAGNOSIS — S8992XA Unspecified injury of left lower leg, initial encounter: Secondary | ICD-10-CM | POA: Diagnosis present

## 2021-03-19 DIAGNOSIS — N183 Chronic kidney disease, stage 3 unspecified: Secondary | ICD-10-CM | POA: Insufficient documentation

## 2021-03-19 DIAGNOSIS — F0390 Unspecified dementia without behavioral disturbance: Secondary | ICD-10-CM | POA: Insufficient documentation

## 2021-03-19 DIAGNOSIS — Z8673 Personal history of transient ischemic attack (TIA), and cerebral infarction without residual deficits: Secondary | ICD-10-CM | POA: Insufficient documentation

## 2021-03-19 DIAGNOSIS — I129 Hypertensive chronic kidney disease with stage 1 through stage 4 chronic kidney disease, or unspecified chronic kidney disease: Secondary | ICD-10-CM | POA: Insufficient documentation

## 2021-03-19 NOTE — ED Notes (Signed)
Patient ambulatory to restroom with assistance

## 2021-03-19 NOTE — ED Notes (Signed)
Unable to sign MSE due to mental status

## 2021-03-19 NOTE — Discharge Instructions (Signed)
Your CT today did not show any fracture or intracranial bleeding  Fall precautions at the facility  You are expected to have black and blue around the left side of your face  See your doctor for follow-up   Return to ER if you have another fall, headaches, vomiting, trouble walking

## 2021-03-19 NOTE — ED Notes (Signed)
Report called to [facility]

## 2021-03-19 NOTE — ED Notes (Signed)
[facility] transportation called for transport [facility] to [facility]

## 2021-03-19 NOTE — ED Provider Notes (Signed)
  Physical Exam  BP (!) 130/55 (BP Location: Left Arm)   Pulse (!) 59   Temp 98.9 F (37.2 C) (Oral)   Resp 18   Ht 5\' 3"  (1.6 m)   Wt 76 kg   SpO2 96%   BMI 29.68 kg/m   Physical Exam  ED Course/Procedures     Procedures  MDM  Care assumed from Dr. at 3 PM.  Patient had a mechanical fall.  Patient did have ecchymosis around the left eye.  Signout pending CT head.  5:49 PM CT head unremarkable.  Patient had some bruising in the left knee area and x-ray did not show any fractures.  Stable for discharge back to facility     Stevie Kern, MD 03/19/21 1749

## 2021-03-19 NOTE — ED Triage Notes (Signed)
BIB ems from [facility] patient resides. Patient had mechanical witnessed fall yesterday after losing balance. Sent to ER today due to hematoma to left eye.

## 2021-03-19 NOTE — ED Notes (Signed)
2 nurses signed MSE due to hx of dementia

## 2021-03-20 NOTE — ED Provider Notes (Signed)
[facility]-EMERGENCY DEPT Provider Note   CSN: [id number] Arrival date & time: 03/19/21  1110     History Chief Complaint  Patient presents with  . Fall    [name] is a 87 y.o. female.  History limited due to Alzheimer's dementia.  Report witnessed fall yesterday after losing balance with concern for head trauma, hematoma to left forehead/eye.  Patient is not able to provide details of events due to dementia but is able to answer some basic questions appropriately.  She denies any medical complaints at present.  HPI     Past Medical History:  Diagnosis Date  . Allergies   . Alzheimer disease (HCC)    STABLE  . Anemia, iron deficiency 12/12/2011  . Arthritis    RF  . CKD (chronic kidney disease)    STAGE 3  . Diabetes mellitus    no mes, diet only   . Diarrhea    RESOLVED  . Hypercholesterolemia   . Hyperlipemia   . Hypertension   . Major depression   . OA (osteoarthritis)    HAND/LEFT KNEE PAIN MILD  . Osteoporosis   . Syncope     Patient Active Problem List   Diagnosis Date Noted  . History of syncope 08/13/2019  . Syncope and collapse 01/23/2019  . Leukocytosis 01/23/2019  . Diarrhea 01/23/2019  . Prolonged QT interval 01/23/2019  . Mild cognitive impairment 07/15/2015  . Acute confusional state 06/19/2015  . Memory loss 06/19/2015  . DM II (diabetes mellitus, type II), controlled (HCC) 05/14/2015  . Hyperlipidemia 05/14/2015  . Borderline diabetes mellitus   . Essential hypertension, benign   . TIA (transient ischemic attack) 05/13/2015  . Anemia, iron deficiency 12/12/2011  . Humerus fracture 12/11/2011  . Hypoxia 12/11/2011  . Wide-complex tachycardia (HCC) 12/11/2011  . Acute hypotension 12/11/2011    Past Surgical History:  Procedure Laterality Date  . left knee arthroscopy    . ORIF HUMERUS FRACTURE  12/09/2011   Procedure: OPEN REDUCTION INTERNAL FIXATION (ORIF) DISTAL HUMERUS FRACTURE;  Surgeon: Sharma Covert, MD;  Location: [facility];  Service: Orthopedics;  Laterality: Right;  . OTHER SURGICAL HISTORY     left small toe bone spur removed   . TONSILLECTOMY       OB History   No obstetric history on file.     Family History  Problem Relation Age of Onset  . Heart Problems Mother   . Heart attack Maternal Grandmother        died from a "heart attack"  . Stroke Neg Hx   . Arrhythmia Neg Hx     Social History   Tobacco Use  . Smoking status: Former Smoker    Types: Cigarettes    Quit date: 12/29/2009    Years since quitting: 11.2  . Smokeless tobacco: Never Used  Vaping Use  . Vaping Use: Never used  Substance Use Topics  . Alcohol use: No    Alcohol/week: 0.0 standard drinks  . Drug use: No    Home Medications Prior to Admission medications   Medication Sig Start Date End Date Taking? Authorizing Provider  acetaminophen (TYLENOL) 325 MG tablet Take 1 tablet (325 mg total) by mouth every 6 (six) hours as needed for moderate pain. 10/10/20  Yes Placido Sou, PA-C  acetaminophen (TYLENOL) 500 MG tablet Take 500 mg by mouth 3 (three) times daily as needed for mild pain.   Yes [provider]  aspirin EC 81 MG EC  tablet Take 1 tablet (81 mg total) by mouth daily. 05/15/15  Yes Vann, Jessica U, DO  Calcium Carbonate-Vitamin D3 600-400 MG-UNIT TABS Take 2 tablets by mouth every morning.   Yes [provider]  docusate sodium (COLACE) 100 MG capsule Take 1 capsule (100 mg total) by mouth every 12 (twelve) hours. Patient taking differently: Take 100 mg by mouth See admin instructions. 100mg  every 12 hours  AND 100mg  daily as needed for constipation 12/27/16  Yes Ward, Kristen N, DO  Melatonin 3 MG TABS Take 3 mg by mouth at bedtime.   Yes [provider]  memantine (NAMENDA) 10 MG tablet Take 10 mg by mouth 2 (two) times daily.   Yes [provider]  metoprolol tartrate (LOPRESSOR) 25 MG tablet Take 25 mg by mouth 2 (two) times daily.   Yes  [provider]  sertraline (ZOLOFT) 100 MG tablet Take 100 mg by mouth at bedtime.   Yes [provider]  simvastatin (ZOCOR) 20 MG tablet Take 1 tablet (20 mg total) by mouth daily at 6 PM. 05/15/15  Yes Vann, Jessica U, DO  vitamin B-12 (CYANOCOBALAMIN) 1000 MCG tablet Take 1,000 mcg by mouth 2 (two) times daily.   Yes [provider]    Allergies    Sulfa antibiotics and Latex  Review of Systems   Review of Systems  Unable to perform ROS: Dementia    Physical Exam Updated Vital Signs BP 95/61 (BP Location: Left Arm)   Pulse 70   Temp 99.2 F (37.3 C) (Oral)   Resp 15   Ht 5\' 3"  (1.6 m)   Wt 76 kg   SpO2 95%   BMI 29.68 kg/m   Physical Exam Vitals and nursing note reviewed.  Constitutional:      General: She is not in acute distress.    Appearance: She is well-developed.  HENT:     Head: Normocephalic.     Comments: Some periorbital ecchymosis over the left eye, eye itself appears normal, conjunctiva clear, no hyphema, normal EOM, vision grossly intact Eyes:     Conjunctiva/sclera: Conjunctivae normal.  Neck:     Comments: Normal neck rom, no TTP over C spine Cardiovascular:     Rate and Rhythm: Normal rate and regular rhythm.     Heart sounds: No murmur heard.   Pulmonary:     Effort: Pulmonary effort is normal. No respiratory distress.     Breath sounds: Normal breath sounds.  Abdominal:     Palpations: Abdomen is soft.     Tenderness: There is no abdominal tenderness.  Musculoskeletal:     Cervical back: Neck supple.     Comments: No TTP over C, T, L spine LLE: no TTP throughout RLE: no TTP throughout RUE: no TTP throughout LUE:  no TTP throughout  Skin:    General: Skin is warm and dry.  Neurological:     General: No focal deficit present.     Mental Status: She is alert.  Psychiatric:        Mood and Affect: Mood normal.     ED Results / Procedures / Treatments   Labs (all labs ordered are listed, but only abnormal  results are displayed) Labs Reviewed - No data to display  EKG None  Radiology DG Chest 1 View  Result Date: 03/19/2021 CLINICAL DATA:  Status post fall. EXAM: CHEST  1 VIEW COMPARISON:  01/03/2020 FINDINGS: The heart size and mediastinal contours are within normal limits. Both lungs are  clear. The visualized skeletal structures are unremarkable. IMPRESSION: No active disease. Electronically Signed   By: Elige Ko   On: 03/19/2021 17:37   DG Pelvis 1-2 Views  Result Date: 03/19/2021 CLINICAL DATA:  Fall yesterday.  History of dementia. EXAM: PELVIS - 1-2 VIEW COMPARISON:  Plain film of the pelvis dated 09/07/2019. FINDINGS: Osseous alignment is normal. No fracture line or displaced fracture fragment is seen. Mild degenerative change at the bilateral hips. Mild degenerative change within the lower lumbar spine. Soft tissues about the pelvis and hips are unremarkable. IMPRESSION: 1. No acute findings. No osseous fracture or dislocation. 2. Mild degenerative change. Electronically Signed   By: Bary Jacquel Redditt M.D.   On: 03/19/2021 17:37   CT Head Wo Contrast  Result Date: 03/19/2021 CLINICAL DATA:  Fall. EXAM: CT HEAD WITHOUT CONTRAST TECHNIQUE: Contiguous axial images were obtained from the base of the skull through the vertex without intravenous contrast. COMPARISON:  October 10, 2020. FINDINGS: Brain: No evidence of acute infarction, hemorrhage, hydrocephalus, extra-axial collection or mass lesion/mass effect. Vascular: No hyperdense vessel or unexpected calcification. Skull: Normal. Negative for fracture or focal lesion. Sinuses/Orbits: No acute finding. Other: None. IMPRESSION: No acute intracranial abnormality seen. Electronically Signed   By: Lupita Raider M.D.   On: 03/19/2021 15:56   DG Knee Complete 4 Views Left  Result Date: 03/19/2021 CLINICAL DATA:  Fall.  History of dementia. EXAM: LEFT KNEE - COMPLETE 4+ VIEW COMPARISON:  None. FINDINGS: No osseous dislocation. No fracture line  or displaced fracture fragment is seen. Degenerative changes at the medial and patellofemoral compartments, mild to moderate in degree with associated joint space narrowing and mild osseous spurring. Lateral compartment is relatively well preserved. Probable small joint effusion. Superficial soft tissues are unremarkable. IMPRESSION: 1. No acute findings. No osseous fracture or dislocation. 2. Degenerative changes at the medial and patellofemoral compartments, mild to moderate in degree. 3. Probable small joint effusion. Electronically Signed   By: Bary Teal Raben M.D.   On: 03/19/2021 17:39    Procedures Procedures   Medications Ordered in ED Medications - No data to display  ED Course  I have reviewed the triage vital signs and the nursing notes.  Pertinent labs & imaging results that were available during my care of the patient were reviewed by me and considered in my medical decision making (see chart for details).    MDM Rules/Calculators/A&P                         87 year old lady with dementia presents to ER after concern for witnessed mechanical fall and head trauma.  She was noted to have a small hematoma over her left face.  Conjunctiva clear, no hyphema, EOM normal. No palpable bony deformity noted.  CT head was ordered to further assess. No other complaints. No TTP throughout neck or spine.   While awaiting CT scan, patient signed out to Dr. Silverio Lay.   Final Clinical Impression(s) / ED Diagnoses Final diagnoses:  Fall, initial encounter  Injury of head, initial encounter  Contusion of left knee, initial encounter    Rx / DC Orders ED Discharge Orders    None       Milagros Loll, MD 03/20/21 [phone]

## 2021-08-08 ENCOUNTER — Emergency Department (HOSPITAL_COMMUNITY): Payer: Medicare Other

## 2021-08-08 ENCOUNTER — Emergency Department (HOSPITAL_COMMUNITY)
Admission: EM | Admit: 2021-08-08 | Discharge: 2021-08-09 | Disposition: A | Discharge status: Home / Self Care | Payer: Medicare Other | Source: Home / Self Care | Attending: Emergency Medicine | Admitting: Emergency Medicine

## 2021-08-08 ENCOUNTER — Encounter (HOSPITAL_COMMUNITY): Payer: Self-pay

## 2021-08-08 ENCOUNTER — Other Ambulatory Visit: Payer: Self-pay

## 2021-08-08 DIAGNOSIS — N390 Urinary tract infection, site not specified: Secondary | ICD-10-CM | POA: Insufficient documentation

## 2021-08-08 DIAGNOSIS — Z79899 Other long term (current) drug therapy: Secondary | ICD-10-CM | POA: Insufficient documentation

## 2021-08-08 DIAGNOSIS — I129 Hypertensive chronic kidney disease with stage 1 through stage 4 chronic kidney disease, or unspecified chronic kidney disease: Secondary | ICD-10-CM | POA: Insufficient documentation

## 2021-08-08 DIAGNOSIS — I4891 Unspecified atrial fibrillation: Secondary | ICD-10-CM | POA: Diagnosis not present

## 2021-08-08 DIAGNOSIS — U071 COVID-19: Secondary | ICD-10-CM | POA: Insufficient documentation

## 2021-08-08 DIAGNOSIS — N183 Chronic kidney disease, stage 3 unspecified: Secondary | ICD-10-CM | POA: Insufficient documentation

## 2021-08-08 DIAGNOSIS — Z87891 Personal history of nicotine dependence: Secondary | ICD-10-CM | POA: Insufficient documentation

## 2021-08-08 DIAGNOSIS — Z91040 Latex allergy status: Secondary | ICD-10-CM | POA: Insufficient documentation

## 2021-08-08 DIAGNOSIS — B9689 Other specified bacterial agents as the cause of diseases classified elsewhere: Secondary | ICD-10-CM | POA: Insufficient documentation

## 2021-08-08 DIAGNOSIS — E1122 Type 2 diabetes mellitus with diabetic chronic kidney disease: Secondary | ICD-10-CM | POA: Insufficient documentation

## 2021-08-08 DIAGNOSIS — F0390 Unspecified dementia without behavioral disturbance: Secondary | ICD-10-CM | POA: Insufficient documentation

## 2021-08-08 DIAGNOSIS — R4182 Altered mental status, unspecified: Secondary | ICD-10-CM | POA: Diagnosis not present

## 2021-08-08 DIAGNOSIS — Z7982 Long term (current) use of aspirin: Secondary | ICD-10-CM | POA: Insufficient documentation

## 2021-08-08 LAB — URINALYSIS, MICROSCOPIC (REFLEX)

## 2021-08-08 LAB — CBC WITH DIFFERENTIAL/PLATELET
Abs Immature Granulocytes: 0.05 10*3/uL (ref 0.00–0.07)
Basophils Absolute: 0.1 10*3/uL (ref 0.0–0.1)
Basophils Relative: 1 %
Eosinophils Absolute: 0 10*3/uL (ref 0.0–0.5)
Eosinophils Relative: 0 %
HCT: 43.3 % (ref 36.0–46.0)
Hemoglobin: 13.8 g/dL (ref 12.0–15.0)
Immature Granulocytes: 1 %
Lymphocytes Relative: 19 %
Lymphs Abs: 1.6 10*3/uL (ref 0.7–4.0)
MCH: 29.1 pg (ref 26.0–34.0)
MCHC: 31.9 g/dL (ref 30.0–36.0)
MCV: 91.4 fL (ref 80.0–100.0)
Monocytes Absolute: 1.2 10*3/uL — ABNORMAL HIGH (ref 0.1–1.0)
Monocytes Relative: 14 %
Neutro Abs: 5.7 10*3/uL (ref 1.7–7.7)
Neutrophils Relative %: 65 %
Platelets: 186 10*3/uL (ref 150–400)
RBC: 4.74 MIL/uL (ref 3.87–5.11)
RDW: 13.2 % (ref 11.5–15.5)
WBC: 8.6 10*3/uL (ref 4.0–10.5)
nRBC: 0 % (ref 0.0–0.2)

## 2021-08-08 LAB — URINALYSIS, ROUTINE W REFLEX MICROSCOPIC
Bilirubin Urine: NEGATIVE
Glucose, UA: NEGATIVE mg/dL
Ketones, ur: NEGATIVE mg/dL
Nitrite: POSITIVE — AB
Protein, ur: NEGATIVE mg/dL
Specific Gravity, Urine: 1.01 (ref 1.005–1.030)
pH: 6 (ref 5.0–8.0)

## 2021-08-08 LAB — LACTIC ACID, PLASMA: Lactic Acid, Venous: 1.7 mmol/L (ref 0.5–1.9)

## 2021-08-08 LAB — COMPREHENSIVE METABOLIC PANEL
ALT: 14 U/L (ref 0–44)
AST: 23 U/L (ref 15–41)
Albumin: 3.5 g/dL (ref 3.5–5.0)
Alkaline Phosphatase: 61 U/L (ref 38–126)
Anion gap: 12 (ref 5–15)
BUN: 14 mg/dL (ref 8–23)
CO2: 22 mmol/L (ref 22–32)
Calcium: 9 mg/dL (ref 8.9–10.3)
Chloride: 100 mmol/L (ref 98–111)
Creatinine, Ser: 1.27 mg/dL — ABNORMAL HIGH (ref 0.44–1.00)
GFR, Estimated: 41 mL/min — ABNORMAL LOW (ref >60)
Glucose, Bld: 114 mg/dL — ABNORMAL HIGH (ref 70–99)
Potassium: 4 mmol/L (ref 3.5–5.1)
Sodium: 134 mmol/L — ABNORMAL LOW (ref 135–145)
Total Bilirubin: 0.7 mg/dL (ref 0.3–1.2)
Total Protein: 6.6 g/dL (ref 6.5–8.1)

## 2021-08-08 LAB — RESP PANEL BY RT-PCR (FLU A&B, COVID) ARPGX2
Influenza A by PCR: NEGATIVE
Influenza B by PCR: NEGATIVE
SARS Coronavirus 2 by RT PCR: POSITIVE — AB

## 2021-08-08 MED ORDER — CEPHALEXIN 500 MG PO CAPS: 500.0000 mg | ORAL_CAPSULE | Freq: Three times a day (TID) | ORAL | 0 refills | Status: DC

## 2021-08-08 MED ORDER — SODIUM CHLORIDE 0.9 % IV SOLN
1.0000 g | Freq: Once | INTRAVENOUS | Status: AC
  Administered 2021-08-08: 1 g via INTRAVENOUS
  Filled 2021-08-08: qty 10

## 2021-08-08 NOTE — ED Notes (Signed)
Pt doesn't know where she stays. Attempted to call daughter Cira Rue) no answer.

## 2021-08-08 NOTE — Discharge Instructions (Addendum)
[name] tested positive for COVID-19.  Her urinalysis also shows a possible urinary tract infection.  Recommend course of antibiotics.  Additionally recommend close follow-up with primary care doctor.  If she develops any difficulty breathing, worsening mental status or other new concerning symptom, come back to ER for reassessment.

## 2021-08-08 NOTE — ED Provider Notes (Signed)
Emergency Medicine Provider Triage Evaluation Note  [name] , a 87 y.o. female  was evaluated in triage.  Pt complains of of altered mental status and weakness per facility.  Patient has no complaints at this time.  Review of Systems  Positive:  Negative: Cough, congestion, fever, chills, shortness of breath, chest pain, leg pain, leg swelling  Physical Exam  BP (!) 138/57 (BP Location: Right Arm)   Pulse 76   Temp 100.3 F (37.9 C) (Oral)   Resp 18   SpO2 94%  Gen:   Awake, no distress   Resp:  Decreased effort MSK:   Moves extremities without difficulty  Other:  Cranial nerves II through XII are intact, 5/5 strength in the upper and lower extremities, normal sensation of the upper and lower extremities  Medical Decision Making  Medically screening exam initiated at 10:39 AM.  Appropriate orders placed.  [name] was informed that the remainder of the evaluation will be completed by another provider, this initial triage assessment does not replace that evaluation, and the importance of remaining in the ED until their evaluation is complete.     Honor Loh Marine, PA-C 08/08/21 1041    Derwood Kaplan, MD 08/10/21 [phone]

## 2021-08-08 NOTE — ED Provider Notes (Signed)
[facility] EMERGENCY DEPARTMENT Provider Note   CSN: [id number] Arrival date & time: 08/08/21  1023     History No chief complaint on file.   [name] is a 87 y.o. female.  Presented to the emergency room with concern for altered mental status, weakness.  Per report from facility since yesterday patient is noted to have increased generalized weakness and seems to be more confused than normal.  Has baseline dementia.  Also found to have vomit on clothing and strong urine order.  Level 5 caveat history limited due to baseline dementia.  Patient is able to answer basic questions.  She denies any acute medical complaints at present.  Specifically denies any chest pain or difficulty in breathing.  No abdominal pain or burning with urination.  HPI     Past Medical History:  Diagnosis Date   Allergies    Alzheimer disease (HCC)    STABLE   Anemia, iron deficiency 12/12/2011   Arthritis    RF   CKD (chronic kidney disease)    STAGE 3   Diabetes mellitus    no mes, diet only    Diarrhea    RESOLVED   Hypercholesterolemia    Hyperlipemia    Hypertension    Major depression    OA (osteoarthritis)    HAND/LEFT KNEE PAIN MILD   Osteoporosis    Syncope     Patient Active Problem List   Diagnosis Date Noted   History of syncope 08/13/2019   Syncope and collapse 01/23/2019   Leukocytosis 01/23/2019   Diarrhea 01/23/2019   Prolonged QT interval 01/23/2019   Mild cognitive impairment 07/15/2015   Acute confusional state 06/19/2015   Memory loss 06/19/2015   DM II (diabetes mellitus, type II), controlled (HCC) 05/14/2015   Hyperlipidemia 05/14/2015   Borderline diabetes mellitus    Essential hypertension, benign    TIA (transient ischemic attack) 05/13/2015   Anemia, iron deficiency 12/12/2011   Humerus fracture 12/11/2011   Hypoxia 12/11/2011   Wide-complex tachycardia (HCC) 12/11/2011   Acute hypotension 12/11/2011    Past Surgical History:   Procedure Laterality Date   left knee arthroscopy     ORIF HUMERUS FRACTURE  12/09/2011   Procedure: OPEN REDUCTION INTERNAL FIXATION (ORIF) DISTAL HUMERUS FRACTURE;  Surgeon: Sharma Covert, MD;  Location: [facility];  Service: Orthopedics;  Laterality: Right;   OTHER SURGICAL HISTORY     left small toe bone spur removed    TONSILLECTOMY       OB History   No obstetric history on file.     Family History  Problem Relation Age of Onset   Heart Problems Mother    Heart attack Maternal Grandmother        died from a "heart attack"   Stroke Neg Hx    Arrhythmia Neg Hx     Social History   Tobacco Use   Smoking status: Former    Types: Cigarettes    Quit date: 12/29/2009    Years since quitting: 11.6   Smokeless tobacco: Never  Vaping Use   Vaping Use: Never used  Substance Use Topics   Alcohol use: No    Alcohol/week: 0.0 standard drinks   Drug use: No    Home Medications Prior to Admission medications   Medication Sig Start Date End Date Taking? Authorizing Provider  cephALEXin (KEFLEX) 500 MG capsule Take 1 capsule (500 mg total) by mouth 3 (three) times daily for 5 days. 08/08/21 08/13/21 Yes  Milagros Loll, MD  acetaminophen (TYLENOL) 325 MG tablet Take 1 tablet (325 mg total) by mouth every 6 (six) hours as needed for moderate pain. 10/10/20   Placido Sou, PA-C  acetaminophen (TYLENOL) 500 MG tablet Take 500 mg by mouth 3 (three) times daily as needed for mild pain.    [provider]  aspirin EC 81 MG EC tablet Take 1 tablet (81 mg total) by mouth daily. 05/15/15   Joseph Art, DO  Calcium Carbonate-Vitamin D3 600-400 MG-UNIT TABS Take 2 tablets by mouth every morning.    [provider]  docusate sodium (COLACE) 100 MG capsule Take 1 capsule (100 mg total) by mouth every 12 (twelve) hours. Patient taking differently: Take 100 mg by mouth See admin instructions. 100mg  every 12 hours  AND 100mg  daily as needed for constipation 12/27/16   Ward,  , DO  Melatonin 3 MG TABS Take 3 mg by mouth at bedtime.    [provider]  memantine (NAMENDA) 10 MG tablet Take 10 mg by mouth 2 (two) times daily.    [provider]  metoprolol tartrate (LOPRESSOR) 25 MG tablet Take 25 mg by mouth 2 (two) times daily.    [provider]  sertraline (ZOLOFT) 100 MG tablet Take 100 mg by mouth at bedtime.    [provider]  simvastatin (ZOCOR) 20 MG tablet Take 1 tablet (20 mg total) by mouth daily at 6 PM. 05/15/15   Layla Maw, DO  vitamin B-12 (CYANOCOBALAMIN) 1000 MCG tablet Take 1,000 mcg by mouth 2 (two) times daily.    [provider]    Allergies    Sulfa antibiotics and Latex  Review of Systems   Review of Systems  Unable to perform ROS: Dementia  Constitutional:  Positive for fever.   Physical Exam Updated Vital Signs BP (!) 166/73   Pulse 78   Temp 99.6 F (37.6 C) (Oral)   Resp (!) 29   Ht 5\' 3"  (1.6 m)   Wt 76 kg   SpO2 91%   BMI 29.68 kg/m   Physical Exam Vitals and nursing note reviewed.  Constitutional:      General: She is not in acute distress.    Appearance: She is well-developed.  HENT:     Head: Normocephalic and atraumatic.  Eyes:     Conjunctiva/sclera: Conjunctivae normal.  Cardiovascular:     Rate and Rhythm: Normal rate and regular rhythm.     Heart sounds: No murmur heard. Pulmonary:     Effort: Pulmonary effort is normal. No respiratory distress.     Breath sounds: Normal breath sounds.  Abdominal:     Palpations: Abdomen is soft.     Tenderness: There is no abdominal tenderness.  Musculoskeletal:     Cervical back: Neck supple.  Skin:    General: Skin is warm and dry.  Neurological:     General: No focal deficit present.     Mental Status: She is alert.     Comments: AAOx2, not to time CN 2-12 intact, speech clear visual fields intact 5/5 strength in b/l UE and LE Sensation to light touch intact in b/l UE and LE Normal FNF    ED  Results / Procedures / Treatments   Labs (all labs ordered are listed, but only abnormal results are displayed) Labs Reviewed  RESP PANEL BY RT-PCR (FLU A&B, COVID) ARPGX2 - Abnormal; Notable for the following components:      Result Value  SARS Coronavirus 2 by RT PCR POSITIVE (*)    All other components within normal limits  COMPREHENSIVE METABOLIC PANEL - Abnormal; Notable for the following components:   Sodium 134 (*)    Glucose, Bld 114 (*)    Creatinine, Ser 1.27 (*)    GFR, Estimated 41 (*)    All other components within normal limits  CBC WITH DIFFERENTIAL/PLATELET - Abnormal; Notable for the following components:   Monocytes Absolute 1.2 (*)    All other components within normal limits  URINALYSIS, ROUTINE W REFLEX MICROSCOPIC - Abnormal; Notable for the following components:   Hgb urine dipstick TRACE (*)    Nitrite POSITIVE (*)    Leukocytes,Ua SMALL (*)    All other components within normal limits  URINALYSIS, MICROSCOPIC (REFLEX) - Abnormal; Notable for the following components:   Bacteria, UA RARE (*)    All other components within normal limits  CULTURE, BLOOD (ROUTINE X 2)  CULTURE, BLOOD (ROUTINE X 2)  LACTIC ACID, PLASMA    EKG EKG Interpretation  Date/Time:  Sunday August 08 2021 10:39:52 EDT Ventricular Rate:  74 PR Interval:  188 QRS Duration: 114 QT Interval:  418 QTC Calculation: 463 R Axis:   6 Text Interpretation: Normal sinus rhythm Incomplete left bundle branch block Minimal voltage criteria for LVH, may be normal variant ( Cornell product ) Borderline ECG Confirmed by Marianna Fuss ([id number]) on 08/08/2021 5:02:30 PM  Radiology DG Chest 2 View  Result Date: 08/08/2021 CLINICAL DATA:  Decreased breath sounds on the right EXAM: CHEST - 2 VIEW COMPARISON:  Chest radiograph 03/19/2021 FINDINGS: The heart is at the upper limits of normal for size. The mediastinum is prominent, exaggerated by AP technique and rightward patient rotation. There is  minimal bibasilar subsegmental atelectasis. There is no focal consolidation or pulmonary edema. There is no pleural effusion or pneumothorax. There is mild degenerative change of the thoracic spine. A catheter projecting over the right neck is favored to be external to the patient. IMPRESSION: Minimal bibasilar subsegmental atelectasis and borderline cardiomegaly. Otherwise, no radiographic evidence of acute cardiopulmonary process. Electronically Signed   By: Lesia Hausen M.D.   On: 08/08/2021 11:54   CT Head Wo Contrast  Result Date: 08/08/2021 CLINICAL DATA:  Delirium, weakness, altered mental status EXAM: CT HEAD WITHOUT CONTRAST TECHNIQUE: Contiguous axial images were obtained from the base of the skull through the vertex without intravenous contrast. COMPARISON:  Brain MRI 12/12/2014 CT head 03/19/2021 FINDINGS: Brain: There is no evidence of acute intracranial hemorrhage, extra-axial fluid collection, or infarct. There is global parenchymal volume loss with commensurate enlargement of the ventricular system, unchanged. Foci of hypodensity in the subcortical and periventricular white matter likely reflect sequela of chronic white matter microangiopathy, also not significantly changed. 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 There is no mass lesion.  There is no midline shift. Vascular: There is calcification of the bilateral cavernous ICAs. Skull: Normal. Negative for fracture or focal lesion. Sinuses/Orbits: There is mild mucosal thickening in the paranasal sinuses. Bilateral lens implants are in place. The globes and orbits are otherwise unremarkable. Other: None. IMPRESSION: 1. No acute intracranial pathology. 2. Unchanged global parenchymal volume loss and chronic white matter microangiopathy Electronically Signed   By: Lesia Hausen M.D.   On: 08/08/2021 11:41    Procedures Procedures   Medications Ordered in ED Medications  cefTRIAXone (ROCEPHIN) 1 g in sodium chloride  0.9 % 100 mL IVPB (0 g Intravenous Stopped 08/08/21 1546)  ED Course  I have reviewed the triage vital signs and the nursing notes.  Pertinent labs & imaging results that were available during my care of the patient were reviewed by me and considered in my medical decision making (see chart for details).    MDM Rules/Calculators/A&P                           87 year old lady presented to the emergency room with concern for increased confusion, episode of vomiting, strong urine odor.  On exam today, patient is actually remarkably well-appearing in no acute distress.  Vital signs are stable.  Low-grade temp noted.  Patient does not have any focal neurologic deficits.  She is alert, oriented to person and place but not time.  Has baseline dementia.  CT head negative.  Obtained infectious work-up given the borderline fever.  COVID test positive.  UA with possible UTI.  Will start on course of antibiotics to cover for possible UTI, will give dose of Rocephin now.  Given her unremarkable well appearance at present, believe she is appropriate and stable for discharge back to her facility and management there.  Recommended recheck with primary doctor.    After the discussed management above, the patient was determined to be safe for discharge.  The patient was in agreement with this plan and all questions regarding their care were answered.  ED return precautions were discussed and the patient will return to the ED with any significant worsening of condition.  Final Clinical Impression(s) / ED Diagnoses Final diagnoses:  Urinary tract infection without hematuria, site unspecified  COVID-19    Rx / DC Orders ED Discharge Orders          Ordered    cephALEXin (KEFLEX) 500 MG capsule  3 times daily        08/08/21 1538             Milagros Loll, MD 08/09/21 [phone]

## 2021-08-08 NOTE — ED Triage Notes (Signed)
Patient arrived by [facility] from nursing facility-patient with AMS and per staff LSN [id number] yesterday. Patient was found today with emesis on clothing and strong odor with urine. Denies pain

## 2021-08-08 NOTE — ED Notes (Signed)
Provided pt with beverage and snack.

## 2021-08-09 ENCOUNTER — Observation Stay (HOSPITAL_COMMUNITY): Payer: Medicare Other

## 2021-08-09 ENCOUNTER — Encounter (HOSPITAL_COMMUNITY): Payer: Self-pay | Admitting: Internal Medicine

## 2021-08-09 ENCOUNTER — Emergency Department (HOSPITAL_COMMUNITY): Payer: Medicare Other

## 2021-08-09 ENCOUNTER — Inpatient Hospital Stay (HOSPITAL_COMMUNITY)
Admission: EM | Admit: 2021-08-09 | Discharge: 2021-08-16 | DRG: 308 | Disposition: A | Discharge status: Skilled Nursing Facility | Payer: Medicare Other | Source: Skilled Nursing Facility | Attending: Internal Medicine | Admitting: Internal Medicine

## 2021-08-09 DIAGNOSIS — N390 Urinary tract infection, site not specified: Secondary | ICD-10-CM | POA: Diagnosis present

## 2021-08-09 DIAGNOSIS — U071 COVID-19: Secondary | ICD-10-CM | POA: Diagnosis not present

## 2021-08-09 DIAGNOSIS — G309 Alzheimer's disease, unspecified: Secondary | ICD-10-CM | POA: Diagnosis present

## 2021-08-09 DIAGNOSIS — G9341 Metabolic encephalopathy: Secondary | ICD-10-CM | POA: Diagnosis present

## 2021-08-09 DIAGNOSIS — F0280 Dementia in other diseases classified elsewhere without behavioral disturbance: Secondary | ICD-10-CM | POA: Diagnosis present

## 2021-08-09 DIAGNOSIS — E785 Hyperlipidemia, unspecified: Secondary | ICD-10-CM | POA: Diagnosis present

## 2021-08-09 DIAGNOSIS — Z882 Allergy status to sulfonamides status: Secondary | ICD-10-CM

## 2021-08-09 DIAGNOSIS — E119 Type 2 diabetes mellitus without complications: Secondary | ICD-10-CM | POA: Diagnosis present

## 2021-08-09 DIAGNOSIS — G9349 Other encephalopathy: Secondary | ICD-10-CM

## 2021-08-09 DIAGNOSIS — I4891 Unspecified atrial fibrillation: Principal | ICD-10-CM | POA: Diagnosis present

## 2021-08-09 DIAGNOSIS — R531 Weakness: Secondary | ICD-10-CM

## 2021-08-09 DIAGNOSIS — Z91040 Latex allergy status: Secondary | ICD-10-CM

## 2021-08-09 DIAGNOSIS — J9601 Acute respiratory failure with hypoxia: Secondary | ICD-10-CM | POA: Diagnosis present

## 2021-08-09 DIAGNOSIS — I10 Essential (primary) hypertension: Secondary | ICD-10-CM | POA: Diagnosis present

## 2021-08-09 DIAGNOSIS — Z79899 Other long term (current) drug therapy: Secondary | ICD-10-CM

## 2021-08-09 DIAGNOSIS — Z87440 Personal history of urinary (tract) infections: Secondary | ICD-10-CM

## 2021-08-09 DIAGNOSIS — Z7982 Long term (current) use of aspirin: Secondary | ICD-10-CM

## 2021-08-09 DIAGNOSIS — N1832 Chronic kidney disease, stage 3b: Secondary | ICD-10-CM | POA: Diagnosis present

## 2021-08-09 DIAGNOSIS — R001 Bradycardia, unspecified: Secondary | ICD-10-CM | POA: Diagnosis not present

## 2021-08-09 LAB — COMPREHENSIVE METABOLIC PANEL
ALT: 20 U/L (ref 0–44)
AST: 58 U/L — ABNORMAL HIGH (ref 15–41)
Albumin: 4 g/dL (ref 3.5–5.0)
Alkaline Phosphatase: 62 U/L (ref 38–126)
Anion gap: 15 (ref 5–15)
BUN: 15 mg/dL (ref 8–23)
CO2: 25 mmol/L (ref 22–32)
Calcium: 9.1 mg/dL (ref 8.9–10.3)
Chloride: 99 mmol/L (ref 98–111)
Creatinine, Ser: 1.17 mg/dL — ABNORMAL HIGH (ref 0.44–1.00)
GFR, Estimated: 45 mL/min — ABNORMAL LOW (ref >60)
Glucose, Bld: 138 mg/dL — ABNORMAL HIGH (ref 70–99)
Potassium: 3.8 mmol/L (ref 3.5–5.1)
Sodium: 139 mmol/L (ref 135–145)
Total Bilirubin: 0.8 mg/dL (ref 0.3–1.2)
Total Protein: 7.7 g/dL (ref 6.5–8.1)

## 2021-08-09 LAB — APTT: aPTT: 28 seconds (ref 24–36)

## 2021-08-09 LAB — HEPARIN LEVEL (UNFRACTIONATED): Heparin Unfractionated: 0.65 IU/mL (ref 0.30–0.70)

## 2021-08-09 LAB — CBC WITH DIFFERENTIAL/PLATELET
Abs Immature Granulocytes: 0.09 10*3/uL — ABNORMAL HIGH (ref 0.00–0.07)
Basophils Absolute: 0.1 10*3/uL (ref 0.0–0.1)
Basophils Relative: 1 %
Eosinophils Absolute: 0 10*3/uL (ref 0.0–0.5)
Eosinophils Relative: 0 %
HCT: 46.7 % — ABNORMAL HIGH (ref 36.0–46.0)
Hemoglobin: 14.8 g/dL (ref 12.0–15.0)
Immature Granulocytes: 1 %
Lymphocytes Relative: 16 %
Lymphs Abs: 1.4 10*3/uL (ref 0.7–4.0)
MCH: 28.8 pg (ref 26.0–34.0)
MCHC: 31.7 g/dL (ref 30.0–36.0)
MCV: 90.9 fL (ref 80.0–100.0)
Monocytes Absolute: 1 10*3/uL (ref 0.1–1.0)
Monocytes Relative: 11 %
Neutro Abs: 6.3 10*3/uL (ref 1.7–7.7)
Neutrophils Relative %: 71 %
Platelets: 203 10*3/uL (ref 150–400)
RBC: 5.14 MIL/uL — ABNORMAL HIGH (ref 3.87–5.11)
RDW: 13.5 % (ref 11.5–15.5)
WBC: 8.8 10*3/uL (ref 4.0–10.5)
nRBC: 0 % (ref 0.0–0.2)

## 2021-08-09 LAB — PROCALCITONIN: Procalcitonin: <0.1 ng/mL

## 2021-08-09 LAB — BRAIN NATRIURETIC PEPTIDE: B Natriuretic Peptide: 114.3 pg/mL — ABNORMAL HIGH (ref 0.0–100.0)

## 2021-08-09 LAB — FERRITIN: Ferritin: 168 ng/mL (ref 11–307)

## 2021-08-09 LAB — TRIGLYCERIDES: Triglycerides: 81 mg/dL (ref <150)

## 2021-08-09 LAB — PROTIME-INR
INR: 1 (ref 0.8–1.2)
Prothrombin Time: 13.1 seconds (ref 11.4–15.2)

## 2021-08-09 LAB — LACTATE DEHYDROGENASE: LDH: 185 U/L (ref 98–192)

## 2021-08-09 LAB — D-DIMER, QUANTITATIVE: D-Dimer, Quant: 1.13 ug/mL-FEU — ABNORMAL HIGH (ref 0.00–0.50)

## 2021-08-09 LAB — C-REACTIVE PROTEIN: CRP: 6.4 mg/dL — ABNORMAL HIGH (ref <1.0)

## 2021-08-09 LAB — FIBRINOGEN: Fibrinogen: 521 mg/dL — ABNORMAL HIGH (ref 210–475)

## 2021-08-09 MED ORDER — VITAMIN B-12 1000 MCG PO TABS
1000.0000 ug | ORAL_TABLET | Freq: Two times a day (BID) | ORAL | Status: DC
  Administered 2021-08-09 – 2021-08-16 (×14): 1000 ug via ORAL
  Filled 2021-08-09 (×14): qty 1

## 2021-08-09 MED ORDER — GUAIFENESIN-DM 100-10 MG/5ML PO SYRP: 10.0000 mL | ORAL_SOLUTION | ORAL | Status: DC | PRN

## 2021-08-09 MED ORDER — SODIUM CHLORIDE 0.9 % IV SOLN: 200.0000 mg | Freq: Once | INTRAVENOUS | Status: DC

## 2021-08-09 MED ORDER — ASCORBIC ACID 500 MG PO TABS
500.0000 mg | ORAL_TABLET | Freq: Every day | ORAL | Status: DC
  Administered 2021-08-09 – 2021-08-16 (×8): 500 mg via ORAL
  Filled 2021-08-09 (×8): qty 1

## 2021-08-09 MED ORDER — ASPIRIN EC 81 MG PO TBEC
81.0000 mg | DELAYED_RELEASE_TABLET | Freq: Every day | ORAL | Status: DC
  Administered 2021-08-10 – 2021-08-11 (×2): 81 mg via ORAL
  Filled 2021-08-09 (×2): qty 1

## 2021-08-09 MED ORDER — HEPARIN BOLUS VIA INFUSION
3000.0000 [IU] | Freq: Once | INTRAVENOUS | Status: AC
  Administered 2021-08-09: 3000 [IU] via INTRAVENOUS
  Filled 2021-08-09: qty 3000

## 2021-08-09 MED ORDER — IPRATROPIUM-ALBUTEROL 20-100 MCG/ACT IN AERS: 1.0000 | INHALATION_SPRAY | Freq: Four times a day (QID) | RESPIRATORY_TRACT | Status: DC | PRN

## 2021-08-09 MED ORDER — CEPHALEXIN 500 MG PO CAPS
500.0000 mg | ORAL_CAPSULE | Freq: Three times a day (TID) | ORAL | Status: DC
Start: 2021-08-09 — End: 2021-08-11
  Administered 2021-08-09 – 2021-08-11 (×5): 500 mg via ORAL
  Filled 2021-08-09 (×7): qty 1

## 2021-08-09 MED ORDER — ALBUTEROL SULFATE HFA 108 (90 BASE) MCG/ACT IN AERS: 4.0000 | INHALATION_SPRAY | RESPIRATORY_TRACT | Status: DC | PRN

## 2021-08-09 MED ORDER — SODIUM CHLORIDE 0.9% FLUSH: 3.0000 mL | INTRAVENOUS | Status: DC | PRN

## 2021-08-09 MED ORDER — DOCUSATE SODIUM 100 MG PO CAPS
100.0000 mg | ORAL_CAPSULE | Freq: Two times a day (BID) | ORAL | Status: DC
  Administered 2021-08-09 – 2021-08-16 (×14): 100 mg via ORAL
  Filled 2021-08-09 (×14): qty 1

## 2021-08-09 MED ORDER — DILTIAZEM HCL 30 MG PO TABS
30.0000 mg | ORAL_TABLET | Freq: Once | ORAL | Status: AC
  Administered 2021-08-09: 30 mg via ORAL
  Filled 2021-08-09: qty 1

## 2021-08-09 MED ORDER — METOPROLOL TARTRATE 25 MG PO TABS
25.0000 mg | ORAL_TABLET | Freq: Two times a day (BID) | ORAL | Status: DC
  Administered 2021-08-11: 25 mg via ORAL
  Filled 2021-08-09 (×5): qty 1

## 2021-08-09 MED ORDER — SODIUM CHLORIDE 0.9 % IV SOLN: 100.0000 mg | Freq: Every day | INTRAVENOUS | Status: DC

## 2021-08-09 MED ORDER — ACETAMINOPHEN 325 MG PO TABS
650.0000 mg | ORAL_TABLET | Freq: Once | ORAL | Status: AC
  Administered 2021-08-09: 650 mg via ORAL
  Filled 2021-08-09: qty 2

## 2021-08-09 MED ORDER — IPRATROPIUM BROMIDE HFA 17 MCG/ACT IN AERS
2.0000 | INHALATION_SPRAY | Freq: Once | RESPIRATORY_TRACT | Status: AC
  Administered 2021-08-09: 2 via RESPIRATORY_TRACT
  Filled 2021-08-09: qty 12.9

## 2021-08-09 MED ORDER — ZINC SULFATE 220 (50 ZN) MG PO CAPS
220.0000 mg | ORAL_CAPSULE | Freq: Every day | ORAL | Status: DC
  Administered 2021-08-09 – 2021-08-16 (×8): 220 mg via ORAL
  Filled 2021-08-09 (×8): qty 1

## 2021-08-09 MED ORDER — NIRMATRELVIR/RITONAVIR (PAXLOVID) TABLET (RENAL DOSING)
2.0000 | ORAL_TABLET | Freq: Two times a day (BID) | ORAL | Status: DC
Start: 2021-08-09 — End: 2021-08-12
  Administered 2021-08-09 – 2021-08-12 (×7): 2 via ORAL
  Filled 2021-08-09 (×2): qty 20

## 2021-08-09 MED ORDER — ACETAMINOPHEN 650 MG RE SUPP: 650.0000 mg | Freq: Four times a day (QID) | RECTAL | Status: DC | PRN

## 2021-08-09 MED ORDER — DEXAMETHASONE SODIUM PHOSPHATE 10 MG/ML IJ SOLN
6.0000 mg | INTRAMUSCULAR | Status: DC
  Administered 2021-08-09 – 2021-08-10 (×2): 6 mg via INTRAVENOUS
  Filled 2021-08-09 (×2): qty 1

## 2021-08-09 MED ORDER — MEMANTINE HCL 10 MG PO TABS
10.0000 mg | ORAL_TABLET | Freq: Two times a day (BID) | ORAL | Status: DC
  Administered 2021-08-09 – 2021-08-16 (×14): 10 mg via ORAL
  Filled 2021-08-09 (×2): qty 1
  Filled 2021-08-09: qty 2
  Filled 2021-08-09 (×8): qty 1
  Filled 2021-08-09: qty 2
  Filled 2021-08-09 (×2): qty 1

## 2021-08-09 MED ORDER — HEPARIN (PORCINE) 25000 UT/250ML-% IV SOLN
700.0000 [IU]/h | INTRAVENOUS | Status: DC
  Administered 2021-08-09: 1000 [IU]/h via INTRAVENOUS
  Administered 2021-08-10 – 2021-08-11 (×2): 850 [IU]/h via INTRAVENOUS
  Filled 2021-08-09 (×4): qty 250

## 2021-08-09 MED ORDER — ACETAMINOPHEN 325 MG PO TABS: 650.0000 mg | ORAL_TABLET | Freq: Four times a day (QID) | ORAL | Status: DC | PRN

## 2021-08-09 MED ORDER — ORAL CARE MOUTH RINSE
15.0000 mL | Freq: Two times a day (BID) | OROMUCOSAL | Status: DC
  Administered 2021-08-09 – 2021-08-16 (×14): 15 mL via OROMUCOSAL

## 2021-08-09 MED ORDER — IOHEXOL 350 MG/ML SOLN
80.0000 mL | Freq: Once | INTRAVENOUS | Status: AC | PRN
  Administered 2021-08-09: 80 mL via INTRAVENOUS

## 2021-08-09 MED ORDER — MELATONIN 3 MG PO TABS
3.0000 mg | ORAL_TABLET | Freq: Every day | ORAL | Status: DC
  Administered 2021-08-09 – 2021-08-15 (×7): 3 mg via ORAL
  Filled 2021-08-09 (×7): qty 1

## 2021-08-09 MED ORDER — DILTIAZEM HCL-DEXTROSE 125-5 MG/125ML-% IV SOLN (PREMIX)
5.0000 mg/h | INTRAVENOUS | Status: DC
  Administered 2021-08-09: 5 mg/h via INTRAVENOUS
  Filled 2021-08-09: qty 125

## 2021-08-09 MED ORDER — CALCIUM CARBONATE-VITAMIN D 500-200 MG-UNIT PO TABS
2.0000 | ORAL_TABLET | Freq: Every day | ORAL | Status: DC
  Administered 2021-08-10 – 2021-08-16 (×7): 2 via ORAL
  Filled 2021-08-09 (×7): qty 2

## 2021-08-09 MED ORDER — SODIUM CHLORIDE 0.9 % IV SOLN: 250.0000 mL | INTRAVENOUS | Status: DC | PRN

## 2021-08-09 MED ORDER — CHLORHEXIDINE GLUCONATE CLOTH 2 % EX PADS
6.0000 | MEDICATED_PAD | Freq: Every day | CUTANEOUS | Status: DC
  Administered 2021-08-09 – 2021-08-10 (×2): 6 via TOPICAL

## 2021-08-09 MED ORDER — HYDROCOD POLST-CPM POLST ER 10-8 MG/5ML PO SUER: 5.0000 mL | Freq: Two times a day (BID) | ORAL | Status: DC | PRN

## 2021-08-09 MED ORDER — SODIUM CHLORIDE 0.9% FLUSH
3.0000 mL | Freq: Two times a day (BID) | INTRAVENOUS | Status: DC
  Administered 2021-08-09 – 2021-08-16 (×13): 3 mL via INTRAVENOUS

## 2021-08-09 NOTE — Progress Notes (Addendum)
ANTICOAGULATION CONSULT NOTE - Initial Consult  Pharmacy Consult for heparin Indication: atrial fibrillation  Allergies  Allergen Reactions   Sulfa Antibiotics Hives, Diarrhea, Nausea And Vomiting and Swelling    Facial swelling ALSO   Latex Rash    Patient Measurements:   Heparin Dosing Weight: 68.6 kg  Vital Signs: Temp: 100.7 F (38.2 C) (09/19 1102) Temp Source: Rectal (09/19 1102) BP: 103/60 (09/19 1330) Pulse Rate: 84 (09/19 1330)  Labs: Recent Labs    08/08/21 1038 08/09/21 1053  HGB 13.8 14.8  HCT 43.3 46.7*  PLT 186 203  CREATININE 1.27* 1.17*    Estimated Creatinine Clearance: 33 mL/min (A) (by C-G formula based on SCr of 1.17 mg/dL (H)).   Medical History: Past Medical History:  Diagnosis Date   Allergies    Alzheimer disease (HCC)    STABLE   Anemia, iron deficiency 12/12/2011   Arthritis    RF   CKD (chronic kidney disease)    STAGE 3   Diabetes mellitus    no mes, diet only    Diarrhea    RESOLVED   Hypercholesterolemia    Hyperlipemia    Hypertension    Major depression    OA (osteoarthritis)    HAND/LEFT KNEE PAIN MILD   Osteoporosis    Syncope     Medications:  Scheduled:   vitamin C  500 mg Oral Daily   dexamethasone (DECADRON) injection  6 mg Intravenous Q24H   diltiazem  30 mg Oral Once   sodium chloride flush  3 mL Intravenous Q12H   zinc sulfate  220 mg Oral Daily    Assessment: 87 yo female presenting with cough and shortness of breath, COVID positive.  Found to have been in Afib with RVR while in emergency room - new diagnosis.  No anticoagulation noted PTA, none received thus far this admit. D-dimer elevated at 1.13 and fibrinogen also elevated at 521.  Hemoglobin and platelets stable.  Baseline INR 1, aPTT 28 seconds  Goal of Therapy:  Heparin level 0.3-0.7 units/ml Monitor platelets by anticoagulation protocol: Yes   Plan:  Give 3000 units bolus x 1 Start heparin infusion at 1000 units/hr Check anti-Xa level in  8 hours and daily while on heparin Continue to monitor H&H and platelets  Rexford Maus, PharmD 08/09/2021 1:49 PM

## 2021-08-09 NOTE — H&P (Signed)
History and Physical    [name] ZOX:[id number] DOB: February 17, 1932 DOA: 08/09/2021  PCP: Lupita Raider, MD  Patient coming from: [facility]  Chief Complaint: confusion, cough  HPI: [name] is a 87 y.o. female with medical history significant of alzheimer's dementia , DM2, HTN, HLD. Presenting w/ confusion. History per chart review as patient is a poor historian and there is no family present. The patient was noticeably ill 2 days ago. She was sent to the ED, where she was found to be COVID positive and also diagnosed w/ a UTI. She was discharged on supportive care for COVID and keflex for UTI. Staff have noticed over these 2 days, she seems more confused and weak. She appeared to be in respiratory distress. They called EMS when her symptoms did not improved.   ED Course: Her O2 sat was 90 - 92% on RA. CXR was clear. She was placed on 2L [place] and improved. She was noted to be in a fib. She was started on a dilt gtt and her rates improved. [facility] was called for admission.   Review of Systems:  Unable to obtain d/t admission  PMHx Past Medical History:  Diagnosis Date   Allergies    Alzheimer disease (HCC)    STABLE   Anemia, iron deficiency 12/12/2011   Arthritis    RF   CKD (chronic kidney disease)    STAGE 3   Diabetes mellitus    no mes, diet only    Diarrhea    RESOLVED   Hypercholesterolemia    Hyperlipemia    Hypertension    Major depression    OA (osteoarthritis)    HAND/LEFT KNEE PAIN MILD   Osteoporosis    Syncope     PSHx Past Surgical History:  Procedure Laterality Date   left knee arthroscopy     ORIF HUMERUS FRACTURE  12/09/2011   Procedure: OPEN REDUCTION INTERNAL FIXATION (ORIF) DISTAL HUMERUS FRACTURE;  Surgeon: Sharma Covert, MD;  Location: [facility];  Service: Orthopedics;  Laterality: Right;   OTHER SURGICAL HISTORY     left small toe bone spur removed    TONSILLECTOMY      SocHx  reports that she quit smoking about 11 years ago. Her  smoking use included cigarettes. She has never used smokeless tobacco. She reports that she does not drink alcohol and does not use drugs.  Allergies  Allergen Reactions   Sulfa Antibiotics Hives, Diarrhea, Nausea And Vomiting and Swelling    Facial swelling ALSO   Latex Rash    FamHx Family History  Problem Relation Age of Onset   Heart Problems Mother    Heart attack Maternal Grandmother        died from a "heart attack"   Stroke Neg Hx    Arrhythmia Neg Hx     Prior to Admission medications   Medication Sig Start Date End Date Taking? Authorizing Provider  acetaminophen (TYLENOL) 325 MG tablet Take 1 tablet (325 mg total) by mouth every 6 (six) hours as needed for moderate pain. 10/10/20  Yes Placido Sou, PA-C  aspirin EC 81 MG EC tablet Take 1 tablet (81 mg total) by mouth daily. 05/15/15  Yes Vann, Jessica U, DO  Calcium Carbonate-Vitamin D3 600-400 MG-UNIT TABS Take 2 tablets by mouth every morning.   Yes [provider]  docusate sodium (COLACE) 100 MG capsule Take 1 capsule (100 mg total) by mouth every 12 (twelve) hours. Patient taking differently: Take 100 mg by  mouth See admin instructions. 100mg  every 12 hours  AND 100mg  daily as needed for constipation 12/27/16  Yes Ward, Kristen N, DO  Melatonin 3 MG TABS Take 3 mg by mouth at bedtime.   Yes [provider]  memantine (NAMENDA) 10 MG tablet Take 10 mg by mouth 2 (two) times daily.   Yes [provider]  metoprolol tartrate (LOPRESSOR) 25 MG tablet Take 25 mg by mouth 2 (two) times daily.   Yes [provider]  sertraline (ZOLOFT) 100 MG tablet Take 100 mg by mouth at bedtime.   Yes [provider]  simvastatin (ZOCOR) 20 MG tablet Take 1 tablet (20 mg total) by mouth daily at 6 PM. 05/15/15  Yes Vann, Jessica U, DO  vitamin B-12 (CYANOCOBALAMIN) 1000 MCG tablet Take 1,000 mcg by mouth 2 (two) times daily.   Yes [provider]  cephALEXin (KEFLEX) 500 MG capsule  Take 1 capsule (500 mg total) by mouth 3 (three) times daily for 5 days. 08/08/21 08/13/21  08/10/21, MD    Physical Exam: Vitals:   08/09/21 1200 08/09/21 1230 08/09/21 1300 08/09/21 1330  BP: 125/81 112/72 125/78 103/60  Pulse: 83 (!) 165 90 84  Resp: 13 (!) 22 18 20   Temp:      TempSrc:      SpO2: 97% 92% 94% 94%    General: 87 y.o. female resting in bed in NAD Eyes: PERRL, normal sclera ENMT: Nares patent w/o discharge, orophaynx clear, dentition normal, ears w/o discharge/lesions/ulcers Neck: Supple, trachea midline Cardiovascular: RRR, +S1, S2, no m/g/r, equal pulses throughout Respiratory: CTABL, no w/r/r, normal WOB GI: BS+, NDNT, no masses noted, no organomegaly noted MSK: No e/c/c Skin: No rashes, bruises, ulcerations noted Neuro: A&O x name, place, no focal deficits Psyc: Pleasantly confused, calm/cooperative  Labs on Admission: I have personally reviewed following labs and imaging studies  CBC: Recent Labs  Lab 08/08/21 1038 08/09/21 1053  WBC 8.6 8.8  NEUTROABS 5.7 6.3  HGB 13.8 14.8  HCT 43.3 46.7*  MCV 91.4 90.9  PLT 186 203   Basic Metabolic Panel: Recent Labs  Lab 08/08/21 1038 08/09/21 1053  NA 134* 139  K 4.0 3.8  CL 100 99  CO2 22 25  GLUCOSE 114* 138*  BUN 14 15  CREATININE 1.27* 1.17*  CALCIUM 9.0 9.1   GFR: Estimated Creatinine Clearance: 33 mL/min (A) (by C-G formula based on SCr of 1.17 mg/dL (H)). Liver Function Tests: Recent Labs  Lab 08/08/21 1038 08/09/21 1053  AST 23 58*  ALT 14 20  ALKPHOS 61 62  BILITOT 0.7 0.8  PROT 6.6 7.7  ALBUMIN 3.5 4.0   No results for input(s): LIPASE, AMYLASE in the last 168 hours. No results for input(s): AMMONIA in the last 168 hours. Coagulation Profile: No results for input(s): INR, PROTIME in the last 168 hours. Cardiac Enzymes: No results for input(s): CKTOTAL, CKMB, CKMBINDEX, TROPONINI in the last 168 hours. BNP (last 3 results) No results for input(s): PROBNP in the  last 8760 hours. HbA1C: No results for input(s): HGBA1C in the last 72 hours. CBG: No results for input(s): GLUCAP in the last 168 hours. Lipid Profile: Recent Labs    08/09/21 1053  TRIG 81   Thyroid Function Tests: No results for input(s): TSH, T4TOTAL, FREET4, T3FREE, THYROIDAB in the last 72 hours. Anemia Panel: Recent Labs    08/09/21 1053  FERRITIN 168   Urine analysis:    Component Value Date/Time   COLORURINE  YELLOW 08/08/2021 1038   APPEARANCEUR CLEAR 08/08/2021 1038   LABSPEC 1.010 08/08/2021 1038   PHURINE 6.0 08/08/2021 1038   GLUCOSEU NEGATIVE 08/08/2021 1038   HGBUR TRACE (A) 08/08/2021 1038   BILIRUBINUR NEGATIVE 08/08/2021 1038   KETONESUR NEGATIVE 08/08/2021 1038   PROTEINUR NEGATIVE 08/08/2021 1038   UROBILINOGEN 0.2 05/13/2015 1849   NITRITE POSITIVE (A) 08/08/2021 1038   LEUKOCYTESUR SMALL (A) 08/08/2021 1038    Radiological Exams on Admission: DG Chest 2 View  Result Date: 08/08/2021 CLINICAL DATA:  Decreased breath sounds on the right EXAM: CHEST - 2 VIEW COMPARISON:  Chest radiograph 03/19/2021 FINDINGS: The heart is at the upper limits of normal for size. The mediastinum is prominent, exaggerated by AP technique and rightward patient rotation. There is minimal bibasilar subsegmental atelectasis. There is no focal consolidation or pulmonary edema. There is no pleural effusion or pneumothorax. There is mild degenerative change of the thoracic spine. A catheter projecting over the right neck is favored to be external to the patient. IMPRESSION: Minimal bibasilar subsegmental atelectasis and borderline cardiomegaly. Otherwise, no radiographic evidence of acute cardiopulmonary process. Electronically Signed   By: Lesia Hausen M.D.   On: 08/08/2021 11:54   CT Head Wo Contrast  Result Date: 08/08/2021 CLINICAL DATA:  Delirium, weakness, altered mental status EXAM: CT HEAD WITHOUT CONTRAST TECHNIQUE: Contiguous axial images were obtained from the base of the  skull through the vertex without intravenous contrast. COMPARISON:  Brain MRI 12/12/2014 CT head 03/19/2021 FINDINGS: Brain: There is no evidence of acute intracranial hemorrhage, extra-axial fluid collection, or infarct. There is global parenchymal volume loss with commensurate enlargement of the ventricular system, unchanged. Foci of hypodensity in the subcortical and periventricular white matter likely reflect sequela of chronic white matter microangiopathy, also not significantly changed. 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 There is no mass lesion.  There is no midline shift. Vascular: There is calcification of the bilateral cavernous ICAs. Skull: Normal. Negative for fracture or focal lesion. Sinuses/Orbits: There is mild mucosal thickening in the paranasal sinuses. Bilateral lens implants are in place. The globes and orbits are otherwise unremarkable. Other: None. IMPRESSION: 1. No acute intracranial pathology. 2. Unchanged global parenchymal volume loss and chronic white matter microangiopathy Electronically Signed   By: Lesia Hausen M.D.   On: 08/08/2021 11:41   DG Chest Port 1 View  Result Date: 08/09/2021 CLINICAL DATA:  COVID-19 positive. EXAM: PORTABLE CHEST 1 VIEW COMPARISON:  August 08, 2021. FINDINGS: Stable cardiomediastinal silhouette. Both lungs are clear. The visualized skeletal structures are unremarkable. IMPRESSION: No active disease. Electronically Signed   By: Lupita Raider M.D.   On: 08/09/2021 12:44    EKG: None obtained in ED  Assessment/Plan COVID 19 Cough     - place in obs, tele     - paxlovid, dexamethasone, guaifenesin, combivent     - IS, flutter     - follow inflammatory markers     - CXR clear     - elevated d-dimer, check CTA chest  New a fib     - secondary to infection?     - on dilt gtt; rate improved; wean     - oral dilt started in ED; she's on metoprolol at home; stop PO dilt and continue home metoprolol tonight     -  started on heparin gtt; continue  Recent UTI     - given dose of rocephin in ED and sent home on keflex; unsure if she  has taken any more since returning to facility     - continue keflex  HTN     - resume home metoprolol if pressures are appropriate tonight  HLD     - hold statin d/t paxolvid  CKD3b     - at baseline, watch nephrotoxins  Acute on chronic metabolic encephalopathy Dementia      - now A&O x 2; following commands      - continue home regimen  DM2?      - diet controlled per chart; check A1c, start DM diet  DVT prophylaxis: heparin gtt  Code Status: FULL  Family Communication: Attempted call to dtr listed in chart, got VM only  Consults called: None   Status is: Observation  The patient remains OBS appropriate and will d/c before 2 midnights.  Dispo: The patient is from: ALF              Anticipated d/c is to: ALF              Patient currently is not medically stable to d/c.   Difficult to place patient No  Time spent coordinating admission: 45 minutes  [name] A Tere Mcconaughey DO [facility]  If 7PM-7AM, please contact night-coverage [email or web address]  08/09/2021, 1:48 PM

## 2021-08-09 NOTE — Progress Notes (Signed)
Attempted to reach CT x 2 (2045 and now). Unable to reach at this time. Will try again later.

## 2021-08-09 NOTE — ED Notes (Signed)
Upon ED arrival, pt presented with BM on her fingers and legs. Pt had previous visit EKG electrodes upon her.

## 2021-08-09 NOTE — ED Notes (Signed)
O2 levels noted to be at 90%-92% on room air. Patient placed on 2L [place] for comfort measures. O2 levels now 95%.

## 2021-08-09 NOTE — ED Notes (Signed)
Pt was provided perineal care and bath, pt was placed in new brief, repositioned and purewick applied by EMT and RN.

## 2021-08-09 NOTE — Progress Notes (Signed)
ANTICOAGULATION CONSULT NOTE - follow up  Pharmacy Consult for heparin Indication: atrial fibrillation  Allergies  Allergen Reactions   Sulfa Antibiotics Hives, Diarrhea, Nausea And Vomiting and Swelling    Facial swelling ALSO   Latex Rash    Patient Measurements: Height: 5\' 4"  (162.6 cm) Weight: 74.1 kg (163 lb 5.8 oz) IBW/kg (Calculated) : 54.7 Heparin Dosing Weight: 68.6 kg  Vital Signs: Temp: 97.7 F (36.5 C) (09/19 2000) Temp Source: Oral (09/19 2000) BP: 131/48 (09/19 2203) Pulse Rate: 54 (09/19 2203)  Labs: Recent Labs    08/08/21 1038 08/09/21 1053 08/09/21 2233  HGB 13.8 14.8  --   HCT 43.3 46.7*  --   PLT 186 203  --   APTT  --  28  --   LABPROT  --  13.1  --   INR  --  1.0  --   HEPARINUNFRC  --   --  0.65  CREATININE 1.27* 1.17*  --      Estimated Creatinine Clearance: 33.4 mL/min (A) (by C-G formula based on SCr of 1.17 mg/dL (H)).   Medical History: Past Medical History:  Diagnosis Date   Allergies    Alzheimer disease (HCC)    STABLE   Anemia, iron deficiency 12/12/2011   Arthritis    RF   CKD (chronic kidney disease)    STAGE 3   Diabetes mellitus    no mes, diet only    Diarrhea    RESOLVED   Hypercholesterolemia    Hyperlipemia    Hypertension    Major depression    OA (osteoarthritis)    HAND/LEFT KNEE PAIN MILD   Osteoporosis    Syncope     Medications:  Scheduled:   vitamin C  500 mg Oral Daily   aspirin EC  81 mg Oral Daily   [START ON 08/10/2021] calcium-vitamin D  2 tablet Oral Q breakfast   cephALEXin  500 mg Oral Q8H   Chlorhexidine Gluconate Cloth  6 each Topical Daily   dexamethasone (DECADRON) injection  6 mg Intravenous Q24H   docusate sodium  100 mg Oral BID   mouth rinse  15 mL Mouth Rinse BID   melatonin  3 mg Oral QHS   memantine  10 mg Oral BID   metoprolol tartrate  25 mg Oral BID   nirmatrelvir/ritonavir EUA (renal dosing)  2 tablet Oral BID   sodium chloride flush  3 mL Intravenous Q12H   vitamin  B-12  1,000 mcg Oral BID   zinc sulfate  220 mg Oral Daily    Assessment: 87 yo female presenting with cough and shortness of breath, COVID positive.  Found to have been in Afib with RVR while in emergency room - new diagnosis.  No anticoagulation noted PTA, none received thus far this admit. D-dimer elevated at 1.13 and fibrinogen also elevated at 521.  Hemoglobin and platelets stable.  Baseline INR 1, aPTT 28 seconds  HL 0.65 therapeutic on 1000 units/hr Per RN no bleeding   Goal of Therapy:  Heparin level 0.3-0.7 units/ml Monitor platelets by anticoagulation protocol: Yes   Plan:  Continue heparin drip at 1000 units/hr Confirmatory heparin level in 8 hours Daily CBC  97 RPh 08/09/2021, 11:21 PM

## 2021-08-09 NOTE — Progress Notes (Deleted)
Attempted to call CT x 2 (2045 and now). Unable to reach at this time. Will try again later.

## 2021-08-09 NOTE — ED Triage Notes (Signed)
BIBA Per EMS: Pt coming from [facility] w/ Dx covid on Saturday. Only c/o slight cough otherwise pt has no complaints  132/68  92% RA 115 HR  24 RR  97.3 Temp  Alert & oriented to self

## 2021-08-09 NOTE — ED Provider Notes (Signed)
[facility]-EMERGENCY DEPT Provider Note   CSN: [id number] Arrival date & time: 08/09/21  1023     History Chief Complaint  Patient presents with   Covid Positive   Cough    [name] is a 87 y.o. female.  HPI     Level 5 caveat for altered mental status, dementia  Pt comes in with cc of weakness. Hx of dementia, CKD, DM and [facility] at [facility].  I spoke with [place] [facility].  They reported that patient started becoming sick on Saturday.  She was sent to the ER and was discharged with a diagnosis of COVID and started on antibiotics for UTI.  At baseline, patient is able to walk and carry on a conversation.  Subsequently they have been monitoring her closely.  Today they saw that she was profoundly weak and also feeling short of breath and in mild respiratory distress.  EMS was therefore called.  Per EMS report, O2 sats 90 to 92% on room air.  She was placed on 2 L of oxygen to get the oxygen at 95% range   Past Medical History:  Diagnosis Date   Allergies    Alzheimer disease (HCC)    STABLE   Anemia, iron deficiency 12/12/2011   Arthritis    RF   CKD (chronic kidney disease)    STAGE 3   Diabetes mellitus    no mes, diet only    Diarrhea    RESOLVED   Hypercholesterolemia    Hyperlipemia    Hypertension    Major depression    OA (osteoarthritis)    HAND/LEFT KNEE PAIN MILD   Osteoporosis    Syncope     Patient Active Problem List   Diagnosis Date Noted   History of syncope 08/13/2019   Syncope and collapse 01/23/2019   Leukocytosis 01/23/2019   Diarrhea 01/23/2019   Prolonged QT interval 01/23/2019   Mild cognitive impairment 07/15/2015   Acute confusional state 06/19/2015   Memory loss 06/19/2015   DM II (diabetes mellitus, type II), controlled (HCC) 05/14/2015   Hyperlipidemia 05/14/2015   Borderline diabetes mellitus    Essential hypertension, benign    TIA (transient  ischemic attack) 05/13/2015   Anemia, iron deficiency 12/12/2011   Humerus fracture 12/11/2011   Hypoxia 12/11/2011   Wide-complex tachycardia (HCC) 12/11/2011   Acute hypotension 12/11/2011    Past Surgical History:  Procedure Laterality Date   left knee arthroscopy     ORIF HUMERUS FRACTURE  12/09/2011   Procedure: OPEN REDUCTION INTERNAL FIXATION (ORIF) DISTAL HUMERUS FRACTURE;  Surgeon: Sharma Covert, MD;  Location: [facility];  Service: Orthopedics;  Laterality: Right;   OTHER SURGICAL HISTORY     left small toe bone spur removed    TONSILLECTOMY       OB History   No obstetric history on file.     Family History  Problem Relation Age of Onset   Heart Problems Mother    Heart attack Maternal Grandmother        died from a "heart attack"   Stroke Neg Hx    Arrhythmia Neg Hx     Social History   Tobacco Use   Smoking status: Former    Types: Cigarettes    Quit date: 12/29/2009    Years since quitting: 11.6   Smokeless tobacco: Never  Vaping Use   Vaping Use: Never used  Substance Use Topics   Alcohol  use: No    Alcohol/week: 0.0 standard drinks   Drug use: No    Home Medications Prior to Admission medications   Medication Sig Start Date End Date Taking? Authorizing Provider  acetaminophen (TYLENOL) 325 MG tablet Take 1 tablet (325 mg total) by mouth every 6 (six) hours as needed for moderate pain. 10/10/20   Placido Sou, PA-C  acetaminophen (TYLENOL) 500 MG tablet Take 500 mg by mouth 3 (three) times daily as needed for mild pain.    [provider]  aspirin EC 81 MG EC tablet Take 1 tablet (81 mg total) by mouth daily. 05/15/15   Joseph Art, DO  Calcium Carbonate-Vitamin D3 600-400 MG-UNIT TABS Take 2 tablets by mouth every morning.    [provider]  cephALEXin (KEFLEX) 500 MG capsule Take 1 capsule (500 mg total) by mouth 3 (three) times daily for 5 days. 08/08/21 08/13/21  Milagros Loll, MD  docusate sodium (COLACE) 100 MG  capsule Take 1 capsule (100 mg total) by mouth every 12 (twelve) hours. Patient taking differently: Take 100 mg by mouth See admin instructions. 100mg  every 12 hours  AND 100mg  daily as needed for constipation 12/27/16   Ward, , DO  Melatonin 3 MG TABS Take 3 mg by mouth at bedtime.    [provider]  memantine (NAMENDA) 10 MG tablet Take 10 mg by mouth 2 (two) times daily.    [provider]  metoprolol tartrate (LOPRESSOR) 25 MG tablet Take 25 mg by mouth 2 (two) times daily.    [provider]  sertraline (ZOLOFT) 100 MG tablet Take 100 mg by mouth at bedtime.    [provider]  simvastatin (ZOCOR) 20 MG tablet Take 1 tablet (20 mg total) by mouth daily at 6 PM. 05/15/15   Layla Maw, DO  vitamin B-12 (CYANOCOBALAMIN) 1000 MCG tablet Take 1,000 mcg by mouth 2 (two) times daily.    [provider]    Allergies    Sulfa antibiotics and Latex  Review of Systems   Review of Systems  Unable to perform ROS: Dementia   Physical Exam Updated Vital Signs BP 125/78   Pulse 90   Temp (!) 100.7 F (38.2 C) (Rectal)   Resp 18   SpO2 94%   Physical Exam Vitals and nursing note reviewed.  Constitutional:      Appearance: She is well-developed.     Comments: Somnolent  HENT:     Head: Atraumatic.  Cardiovascular:     Rate and Rhythm: Tachycardia present.  Pulmonary:     Effort: Pulmonary effort is normal.     Comments: Tachypnea Skin:    General: Skin is warm and dry.  Neurological:     General: No focal deficit present.    ED Results / Procedures / Treatments   Labs (all labs ordered are listed, but only abnormal results are displayed) Labs Reviewed  CBC WITH DIFFERENTIAL/PLATELET - Abnormal; Notable for the following components:      Result Value   RBC 5.14 (*)    HCT 46.7 (*)    Abs Immature Granulocytes 0.09 (*)    All other components within normal limits  COMPREHENSIVE METABOLIC PANEL - Abnormal; Notable for the  following components:   Glucose, Bld 138 (*)    Creatinine, Ser 1.17 (*)    AST 58 (*)    GFR, Estimated 45 (*)    All other components within normal limits  D-DIMER, QUANTITATIVE - Abnormal; Notable  for the following components:   D-Dimer, Quant 1.13 (*)    All other components within normal limits  FIBRINOGEN - Abnormal; Notable for the following components:   Fibrinogen 521 (*)    All other components within normal limits  C-REACTIVE PROTEIN - Abnormal; Notable for the following components:   CRP 6.4 (*)    All other components within normal limits  BRAIN NATRIURETIC PEPTIDE - Abnormal; Notable for the following components:   B Natriuretic Peptide 114.3 (*)    All other components within normal limits  PROCALCITONIN  LACTATE DEHYDROGENASE  FERRITIN  TRIGLYCERIDES    EKG EKG Interpretation  Date/Time:  Monday August 09 2021 11:47:40 EDT Ventricular Rate:  164 PR Interval:    QRS Duration: 113 QT Interval:  282 QTC Calculation: 466 R Axis:   -9 Text Interpretation: Atrial fibrillation with rapid V-rate Incomplete left bundle branch block LVH with secondary repolarization abnormality AF is new Confirmed by Derwood Kaplan [phone]) on 08/09/2021 11:49:31 AM  Radiology DG Chest 2 View  Result Date: 08/08/2021 CLINICAL DATA:  Decreased breath sounds on the right EXAM: CHEST - 2 VIEW COMPARISON:  Chest radiograph 03/19/2021 FINDINGS: The heart is at the upper limits of normal for size. The mediastinum is prominent, exaggerated by AP technique and rightward patient rotation. There is minimal bibasilar subsegmental atelectasis. There is no focal consolidation or pulmonary edema. There is no pleural effusion or pneumothorax. There is mild degenerative change of the thoracic spine. A catheter projecting over the right neck is favored to be external to the patient. IMPRESSION: Minimal bibasilar subsegmental atelectasis and borderline cardiomegaly. Otherwise, no radiographic evidence of  acute cardiopulmonary process. Electronically Signed   By: Lesia Hausen M.D.   On: 08/08/2021 11:54   CT Head Wo Contrast  Result Date: 08/08/2021 CLINICAL DATA:  Delirium, weakness, altered mental status EXAM: CT HEAD WITHOUT CONTRAST TECHNIQUE: Contiguous axial images were obtained from the base of the skull through the vertex without intravenous contrast. COMPARISON:  Brain MRI 12/12/2014 CT head 03/19/2021 FINDINGS: Brain: There is no evidence of acute intracranial hemorrhage, extra-axial fluid collection, or infarct. There is global parenchymal volume loss with commensurate enlargement of the ventricular system, unchanged. Foci of hypodensity in the subcortical and periventricular white matter likely reflect sequela of chronic white matter microangiopathy, also not significantly changed. 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 There is no mass lesion.  There is no midline shift. Vascular: There is calcification of the bilateral cavernous ICAs. Skull: Normal. Negative for fracture or focal lesion. Sinuses/Orbits: There is mild mucosal thickening in the paranasal sinuses. Bilateral lens implants are in place. The globes and orbits are otherwise unremarkable. Other: None. IMPRESSION: 1. No acute intracranial pathology. 2. Unchanged global parenchymal volume loss and chronic white matter microangiopathy Electronically Signed   By: Lesia Hausen M.D.   On: 08/08/2021 11:41   DG Chest Port 1 View  Result Date: 08/09/2021 CLINICAL DATA:  COVID-19 positive. EXAM: PORTABLE CHEST 1 VIEW COMPARISON:  August 08, 2021. FINDINGS: Stable cardiomediastinal silhouette. Both lungs are clear. The visualized skeletal structures are unremarkable. IMPRESSION: No active disease. Electronically Signed   By: Lupita Raider M.D.   On: 08/09/2021 12:44    Procedures .Critical Care Performed by: Derwood Kaplan, MD Authorized by: Derwood Kaplan, MD   Critical care provider statement:     Critical care time (minutes):  52   Critical care time was exclusive of:  Separately billable procedures and treating other patients  Critical care was necessary to treat or prevent imminent or life-threatening deterioration of the following conditions:  Respiratory failure, circulatory failure, cardiac failure and CNS failure or compromise   Critical care was time spent personally by me on the following activities:  Discussions with consultants, evaluation of patient's response to treatment, examination of patient, ordering and performing treatments and interventions, ordering and review of laboratory studies, ordering and review of radiographic studies, pulse oximetry, re-evaluation of patient's condition, obtaining history from patient or surrogate, review of old charts and development of treatment plan with patient or surrogate   Medications Ordered in ED Medications  sodium chloride flush (NS) 0.9 % injection 3 mL (3 mLs Intravenous Given 08/09/21 1204)  sodium chloride flush (NS) 0.9 % injection 3 mL (has no administration in time range)  0.9 %  sodium chloride infusion (has no administration in time range)  albuterol (VENTOLIN HFA) 108 (90 Base) MCG/ACT inhaler 4 puff (has no administration in time range)  diltiazem (CARDIZEM) 125 mg in dextrose 5% 125 mL (1 mg/mL) infusion (7.5 mg/hr Intravenous Rate/Dose Change 08/09/21 1318)  acetaminophen (TYLENOL) tablet 650 mg (650 mg Oral Given 08/09/21 1157)  ipratropium (ATROVENT HFA) inhaler 2 puff (2 puffs Inhalation Given 08/09/21 1158)  diltiazem (CARDIZEM) tablet 30 mg (30 mg Oral Given 08/09/21 1257)    ED Course  I have reviewed the triage vital signs and the nursing notes.  Pertinent labs & imaging results that were available during my care of the patient were reviewed by me and considered in my medical decision making (see chart for details).  Clinical Course as of 08/09/21 1338  Mon Aug 09, 2021  1245 Patient was reassessed.  Heart rate  still in the 120s.  Labs are pending at this time.  IV diltiazem drip along with oral 30 mg dose ordered.  I will also wean her oxygen to 1 L to see if she is truly hypoxic or not. [AN]  1337 Pulse Rate: 90 Heart rate seems to have responded to diltiazem drip.  We will admit her for new onset A. fib with RVR, COVID-19 encephalopathy. [AN]    Clinical Course User Index [AN] Derwood Kaplan, MD   MDM Rules/Calculators/A&P                           87 year old female with history of diabetes, dementia, CKD comes in with chief complaint of cough and shortness of breath.  Patient able to provide meaningful history.  She was seen in the ER yesterday at [facility] where she was diagnosed with COVID-19 and UTI, started on antibiotics.  I discussed the case with nursing home, they informed me that at baseline patient is active and able to hold a conversation.  She started getting sick on Saturday.  She is noted to be in A. fib with RVR at this time.  A. fib diagnosis is new for her.  She has a low-grade fever and is requiring 2 L of oxygen to maintain O2 sats over 95%.  Patient is tachypneic, no significant respiratory distress at this time.  Suspicion is that patient's respiratory change could be because of A. fib with RVR.  A. fib itself could be a result of her COVID-19.  We will get basic labs, x-ray and start rate control as soon as we have more information.  Final Clinical Impression(s) / ED Diagnoses Final diagnoses:  Encephalopathy due to COVID-19 virus  Generalized weakness    Rx /  DC Orders ED Discharge Orders     None        Derwood Kaplan, MD 08/09/21 1338

## 2021-08-10 DIAGNOSIS — F0280 Dementia in other diseases classified elsewhere without behavioral disturbance: Secondary | ICD-10-CM | POA: Diagnosis present

## 2021-08-10 DIAGNOSIS — N390 Urinary tract infection, site not specified: Secondary | ICD-10-CM | POA: Diagnosis present

## 2021-08-10 DIAGNOSIS — E119 Type 2 diabetes mellitus without complications: Secondary | ICD-10-CM | POA: Diagnosis present

## 2021-08-10 DIAGNOSIS — N1832 Chronic kidney disease, stage 3b: Secondary | ICD-10-CM | POA: Diagnosis present

## 2021-08-10 DIAGNOSIS — I4891 Unspecified atrial fibrillation: Secondary | ICD-10-CM | POA: Diagnosis present

## 2021-08-10 DIAGNOSIS — G9341 Metabolic encephalopathy: Secondary | ICD-10-CM

## 2021-08-10 DIAGNOSIS — U071 COVID-19: Secondary | ICD-10-CM | POA: Diagnosis present

## 2021-08-10 DIAGNOSIS — I10 Essential (primary) hypertension: Secondary | ICD-10-CM | POA: Diagnosis present

## 2021-08-10 DIAGNOSIS — Z7982 Long term (current) use of aspirin: Secondary | ICD-10-CM | POA: Diagnosis not present

## 2021-08-10 DIAGNOSIS — J9601 Acute respiratory failure with hypoxia: Secondary | ICD-10-CM | POA: Diagnosis present

## 2021-08-10 DIAGNOSIS — R001 Bradycardia, unspecified: Secondary | ICD-10-CM | POA: Diagnosis not present

## 2021-08-10 DIAGNOSIS — Z79899 Other long term (current) drug therapy: Secondary | ICD-10-CM | POA: Diagnosis not present

## 2021-08-10 DIAGNOSIS — Z87440 Personal history of urinary (tract) infections: Secondary | ICD-10-CM | POA: Diagnosis not present

## 2021-08-10 DIAGNOSIS — R4182 Altered mental status, unspecified: Secondary | ICD-10-CM | POA: Diagnosis present

## 2021-08-10 DIAGNOSIS — Z91040 Latex allergy status: Secondary | ICD-10-CM | POA: Diagnosis not present

## 2021-08-10 DIAGNOSIS — G309 Alzheimer's disease, unspecified: Secondary | ICD-10-CM | POA: Diagnosis present

## 2021-08-10 DIAGNOSIS — E785 Hyperlipidemia, unspecified: Secondary | ICD-10-CM | POA: Diagnosis present

## 2021-08-10 DIAGNOSIS — Z882 Allergy status to sulfonamides status: Secondary | ICD-10-CM | POA: Diagnosis not present

## 2021-08-10 LAB — CBC WITH DIFFERENTIAL/PLATELET
Abs Immature Granulocytes: 0.02 10*3/uL (ref 0.00–0.07)
Basophils Absolute: 0 10*3/uL (ref 0.0–0.1)
Basophils Relative: 0 %
Eosinophils Absolute: 0 10*3/uL (ref 0.0–0.5)
Eosinophils Relative: 0 %
HCT: 43.9 % (ref 36.0–46.0)
Hemoglobin: 14.3 g/dL (ref 12.0–15.0)
Immature Granulocytes: 0 %
Lymphocytes Relative: 20 %
Lymphs Abs: 1.2 10*3/uL (ref 0.7–4.0)
MCH: 28.9 pg (ref 26.0–34.0)
MCHC: 32.6 g/dL (ref 30.0–36.0)
MCV: 88.9 fL (ref 80.0–100.0)
Monocytes Absolute: 0.3 10*3/uL (ref 0.1–1.0)
Monocytes Relative: 5 %
Neutro Abs: 4.5 10*3/uL (ref 1.7–7.7)
Neutrophils Relative %: 75 %
Platelets: 186 10*3/uL (ref 150–400)
RBC: 4.94 MIL/uL (ref 3.87–5.11)
RDW: 13.3 % (ref 11.5–15.5)
WBC: 6.1 10*3/uL (ref 4.0–10.5)
nRBC: 0 % (ref 0.0–0.2)

## 2021-08-10 LAB — C-REACTIVE PROTEIN: CRP: 5.5 mg/dL — ABNORMAL HIGH (ref <1.0)

## 2021-08-10 LAB — COMPREHENSIVE METABOLIC PANEL
ALT: 21 U/L (ref 0–44)
AST: 46 U/L — ABNORMAL HIGH (ref 15–41)
Albumin: 3.7 g/dL (ref 3.5–5.0)
Alkaline Phosphatase: 57 U/L (ref 38–126)
Anion gap: 11 (ref 5–15)
BUN: 22 mg/dL (ref 8–23)
CO2: 26 mmol/L (ref 22–32)
Calcium: 8.8 mg/dL — ABNORMAL LOW (ref 8.9–10.3)
Chloride: 98 mmol/L (ref 98–111)
Creatinine, Ser: 1.02 mg/dL — ABNORMAL HIGH (ref 0.44–1.00)
GFR, Estimated: 53 mL/min — ABNORMAL LOW (ref >60)
Glucose, Bld: 157 mg/dL — ABNORMAL HIGH (ref 70–99)
Potassium: 4.1 mmol/L (ref 3.5–5.1)
Sodium: 135 mmol/L (ref 135–145)
Total Bilirubin: 0.7 mg/dL (ref 0.3–1.2)
Total Protein: 7.2 g/dL (ref 6.5–8.1)

## 2021-08-10 LAB — HEMOGLOBIN A1C
Hgb A1c MFr Bld: 5.5 % (ref 4.8–5.6)
Mean Plasma Glucose: 111.15 mg/dL

## 2021-08-10 LAB — MRSA NEXT GEN BY PCR, NASAL: MRSA by PCR Next Gen: NOT DETECTED

## 2021-08-10 LAB — HEPARIN LEVEL (UNFRACTIONATED)
Heparin Unfractionated: 0.84 IU/mL — ABNORMAL HIGH (ref 0.30–0.70)
Heparin Unfractionated: 0.41 IU/mL (ref 0.30–0.70)

## 2021-08-10 LAB — FERRITIN: Ferritin: 186 ng/mL (ref 11–307)

## 2021-08-10 LAB — D-DIMER, QUANTITATIVE: D-Dimer, Quant: 0.72 ug/mL-FEU — ABNORMAL HIGH (ref 0.00–0.50)

## 2021-08-10 NOTE — NC FL2 (Signed)
[place] MEDICAID FL2 LEVEL OF CARE SCREENING TOOL     IDENTIFICATION  Patient Name: [name] Birthdate: 11-16-1932 Sex: female Admission Date (Current Location): 08/09/2021  [facility] and [place] Number:  Producer, television/film/video and Address:  [facility],  [address] [place], [place] [address]      Provider Number: [id number]  Attending Physician Name and Address:  Azucena Fallen, MD  Relative Name and Phone Number:       Current Level of Care: Hospital Recommended Level of Care: Skilled Nursing Facility Prior Approval Number:    Date Approved/Denied:   PASRR Number: [id number]  Discharge Plan: SNF    Current Diagnoses: Patient Active Problem List   Diagnosis Date Noted   Acute metabolic encephalopathy 08/10/2021   COVID-19 08/09/2021   History of syncope 08/13/2019   Syncope and collapse 01/23/2019   Leukocytosis 01/23/2019   Diarrhea 01/23/2019   Prolonged QT interval 01/23/2019   Mild cognitive impairment 07/15/2015   Acute confusional state 06/19/2015   Memory loss 06/19/2015   DM II (diabetes mellitus, type II), controlled (HCC) 05/14/2015   Hyperlipidemia 05/14/2015   Borderline diabetes mellitus    Essential hypertension, benign    TIA (transient ischemic attack) 05/13/2015   Anemia, iron deficiency 12/12/2011   Humerus fracture 12/11/2011   Hypoxia 12/11/2011   Wide-complex tachycardia (HCC) 12/11/2011   Acute hypotension 12/11/2011    Orientation RESPIRATION BLADDER Height & Weight     Self  Normal Continent Weight: 74.1 kg Height:  5\' 4"  (162.6 cm)  BEHAVIORAL SYMPTOMS/MOOD NEUROLOGICAL BOWEL NUTRITION STATUS      Continent Diet (regular)  AMBULATORY STATUS COMMUNICATION OF NEEDS Skin   Extensive Assist Verbally Normal                       Personal Care Assistance Level of Assistance  Bathing, Feeding, Dressing Bathing Assistance: Limited assistance Feeding assistance: Limited assistance Dressing Assistance:  Limited assistance     Functional Limitations Info  Sight, Hearing, Speech Sight Info: Impaired (wears glasses) Hearing Info: Adequate Speech Info: Adequate    SPECIAL CARE FACTORS FREQUENCY  PT (By licensed PT), OT (By licensed OT)     PT Frequency: 5 x weekly OT Frequency: 5 x weekly            Contractures Contractures Info: Not present    Additional Factors Info  Code Status Code Status Info: full             Current Medications (08/10/2021):  This is the current hospital active medication list Current Facility-Administered Medications  Medication Dose Route Frequency Provider Last Rate Last Admin   0.9 %  sodium chloride infusion  250 mL Intravenous PRN 08/12/2021, Tyrone A, DO       acetaminophen (TYLENOL) tablet 650 mg  650 mg Oral Q6H PRN Ronaldo Miyamoto, Tyrone A, DO       Or   acetaminophen (TYLENOL) suppository 650 mg  650 mg Rectal Q6H PRN Ronaldo Miyamoto, Tyrone A, DO       ascorbic acid (VITAMIN C) tablet 500 mg  500 mg Oral Daily Kyle, Tyrone A, DO   500 mg at 08/10/21 1007   aspirin EC tablet 81 mg  81 mg Oral Daily Kyle, Tyrone A, DO   81 mg at 08/10/21 1007   calcium-vitamin D (OSCAL WITH D) 500-200 MG-UNIT per tablet 2 tablet  2 tablet Oral Q breakfast 08/12/21, Tyrone A, DO   2 tablet at 08/10/21 [phone]  cephALEXin (KEFLEX) capsule 500 mg  500 mg Oral Q8H Kyle, Tyrone A, DO   500 mg at 08/10/21 3976   Chlorhexidine Gluconate Cloth 2 % PADS 6 each  6 each Topical Daily Ronaldo Miyamoto, Tyrone A, DO   6 each at 08/09/21 2025   chlorpheniramine-HYDROcodone (TUSSIONEX) 10-8 MG/5ML suspension 5 mL  5 mL Oral Q12H PRN Ronaldo Miyamoto, Tyrone A, DO       dexamethasone (DECADRON) injection 6 mg  6 mg Intravenous Q24H Kyle, Tyrone A, DO   6 mg at 08/09/21 1410   diltiazem (CARDIZEM) 125 mg in dextrose 5% 125 mL (1 mg/mL) infusion  5-15 mg/hr Intravenous Continuous Kyle, Tyrone A, DO   Stopped at 08/09/21 2027   docusate sodium (COLACE) capsule 100 mg  100 mg Oral BID Ronaldo Miyamoto, Tyrone A, DO   100 mg at 08/10/21 1009    guaiFENesin-dextromethorphan (ROBITUSSIN DM) 100-10 MG/5ML syrup 10 mL  10 mL Oral Q4H PRN Ronaldo Miyamoto, Tyrone A, DO       heparin ADULT infusion 100 units/mL (25000 units/276mL)  850 Units/hr Intravenous Continuous Otho Bellows, RPH 8.5 mL/hr at 08/10/21 0841 850 Units/hr at 08/10/21 0841   Ipratropium-Albuterol (COMBIVENT) respimat 1 puff  1 puff Inhalation Q6H PRN Margie Ege A, DO       MEDLINE mouth rinse  15 mL Mouth Rinse BID Ronaldo Miyamoto, Tyrone A, DO   15 mL at 08/10/21 1009   melatonin tablet 3 mg  3 mg Oral QHS Kyle, Tyrone A, DO   3 mg at 08/09/21 2202   memantine (NAMENDA) tablet 10 mg  10 mg Oral BID Ronaldo Miyamoto, Tyrone A, DO   10 mg at 08/10/21 1007   metoprolol tartrate (LOPRESSOR) tablet 25 mg  25 mg Oral BID Margie Ege A, DO       nirmatrelvir/ritonavir EUA (renal dosing) (PAXLOVID) 2 tablet  2 tablet Oral BID Ronaldo Miyamoto, Tyrone A, DO   2 tablet at 08/10/21 1008   sodium chloride flush (NS) 0.9 % injection 3 mL  3 mL Intravenous Q12H Kyle, Tyrone A, DO   3 mL at 08/10/21 1009   sodium chloride flush (NS) 0.9 % injection 3 mL  3 mL Intravenous PRN Ronaldo Miyamoto, Tyrone A, DO       vitamin B-12 (CYANOCOBALAMIN) tablet 1,000 mcg  1,000 mcg Oral BID Ronaldo Miyamoto, Tyrone A, DO   1,000 mcg at 08/10/21 1008   zinc sulfate capsule 220 mg  220 mg Oral Daily Kyle, Tyrone A, DO   220 mg at 08/09/21 1408     Discharge Medications: Please see discharge summary for a list of discharge medications.  Relevant Imaging Results:  Relevant Lab Results:   Additional Information ssn:[id number]  Golda Acre, RN

## 2021-08-10 NOTE — TOC Initial Note (Signed)
Transition of Care [facility]) - Initial/Assessment Note    Patient Details  Name: [name] MRN: [id number] Date of Birth: 05/17/1932  [facility] CM/SW Contact:    Golda Acre, RN Phone Number: 08/10/2021, 8:41 AM  Clinical Narrative:                 87 y.o. female with medical history significant of alzheimer's dementia , DM2, HTN, HLD. Presenting w/ confusion. History per chart review as patient is a poor historian and there is no family present. The patient was noticeably ill 2 days ago. She was sent to the ED, where she was found to be COVID positive and also diagnosed w/ a UTI. She was discharged on supportive care for COVID and keflex for UTI. Staff have noticed over these 2 days, she seems more confused and weak. She appeared to be in respiratory distress. They called EMS when her symptoms did not improved.    ED Course: Her O2 sat was 90 - 92% on RA. CXR was clear. She was placed on 2L [place] and improved. She was noted to be in a fib. She was started on a dilt gtt and her rates improved. TRH was called for admission.   TOC PLAN OF CARE: will return in [place] with family.  Expected Discharge Plan: Home/Self Care (family) Barriers to Discharge: Continued Medical Work up   Patient Goals and CMS Choice Patient states their goals for this hospitalization and ongoing recovery are:: unable to give   Choice offered to / list presented to : Sibling  Expected Discharge Plan and Services Expected Discharge Plan: Home/Self Care (family)   Discharge Planning Services: CM Consult   Living arrangements for the past 2 months: Single Family Home                                      Prior Living Arrangements/Services Living arrangements for the past 2 months: Single Family Home Lives with:: Friends Patient language and need for interpreter reviewed:: Yes Do you feel safe going back to the place where you live?: Yes            Criminal Activity/Legal  Involvement Pertinent to Current Situation/Hospitalization: No - Comment as needed  Activities of Daily Living Home Assistive Devices/Equipment: Blood pressure cuff, CBG Meter, Eyeglasses, Grab bars around toilet, Grab bars in shower, Hand-held shower hose, Walker (specify type), Hospital bed, Scales, Wheelchair (front wheeled walker) ADL Screening (condition at time of admission) Patient's cognitive ability adequate to safely complete daily activities?: No Is the patient deaf or have difficulty hearing?: No Does the patient have difficulty seeing, even when wearing glasses/contacts?: No Does the patient have difficulty concentrating, remembering, or making decisions?: Yes Patient able to express need for assistance with ADLs?: No Does the patient have difficulty dressing or bathing?: Yes Independently performs ADLs?: No (secondary to worsening weakness) Communication: Independent Dressing (OT): Needs assistance Is this a change from baseline?: Pre-admission baseline Grooming: Independent Feeding: Needs assistance Is this a change from baseline?: Pre-admission baseline Bathing: Needs assistance Is this a change from baseline?: Pre-admission baseline Toileting: Dependent Is this a change from baseline?: Change from baseline, expected to last >3days In/Out Bed: Dependent Is this a change from baseline?: Change from baseline, expected to last >3 days Walks in Home: Dependent Is this a change from baseline?: Change from baseline, expected to last >3 days Does the  patient have difficulty walking or climbing stairs?: Yes (secondary to weakness) Weakness of Legs: Both Weakness of Arms/Hands: None  Permission Sought/Granted                  Emotional Assessment Appearance:: Appears stated age     Orientation: : Fluctuating Orientation (Suspected and/or reported Sundowners) Alcohol / Substance Use: Tobacco Use, Not Applicable Psych Involvement: No (comment)  Admission diagnosis:   Generalized weakness [R53.1] Encephalopathy due to COVID-19 virus [U07.1, G93.49] COVID-19 [U07.1] Patient Active Problem List   Diagnosis Date Noted   COVID-19 08/09/2021   History of syncope 08/13/2019   Syncope and collapse 01/23/2019   Leukocytosis 01/23/2019   Diarrhea 01/23/2019   Prolonged QT interval 01/23/2019   Mild cognitive impairment 07/15/2015   Acute confusional state 06/19/2015   Memory loss 06/19/2015   DM II (diabetes mellitus, type II), controlled (HCC) 05/14/2015   Hyperlipidemia 05/14/2015   Borderline diabetes mellitus    Essential hypertension, benign    TIA (transient ischemic attack) 05/13/2015   Anemia, iron deficiency 12/12/2011   Humerus fracture 12/11/2011   Hypoxia 12/11/2011   Wide-complex tachycardia (HCC) 12/11/2011   Acute hypotension 12/11/2011   PCP:  Lupita Raider, MD Pharmacy:   [id number] - [place], [place] - [address] AT [facility] OF [address] [place] [address] Phone: [phone] Fax: [phone]  [id number] - [place], [place] - [address] AT [facility] [address] [place] [address] Phone: [phone] Fax: [phone]     Social Determinants of Health (SDOH) Interventions    Readmission Risk Interventions No flowsheet data found.

## 2021-08-10 NOTE — TOC Progression Note (Signed)
Transition of Care [facility]) - Progression Note    Patient Details  Name: [name] MRN: [id number] Date of Birth: 06-27-1932  Transition of Care [facility] CM/SW Contact  Golda Acre, RN Phone Number: 08/10/2021, 12:57 PM  Clinical Narrative:    Physical therapy suggesting snf after hospital.  Fl2 faxed out to area snfs for review.  Covid +.  SSN: [id number] Passar: [phone]   Expected Discharge Plan: Home/Self Care (family) Barriers to Discharge: Continued Medical Work up  Expected Discharge Plan and Services Expected Discharge Plan: Home/Self Care (family)   Discharge Planning Services: CM Consult   Living arrangements for the past 2 months: Single Family Home                                       Social Determinants of Health (SDOH) Interventions    Readmission Risk Interventions No flowsheet data found.

## 2021-08-10 NOTE — Progress Notes (Signed)
PROGRESS NOTE    [name]  YQM:[id number] DOB: [birth year] DOA: 08/09/2021 PCP: Lupita Raider, MD   Brief Narrative:  [name] is a 87 y.o. female with medical history significant of alzheimer's dementia , DM2, HTN, HLD. Presenting w/ confusion.  Patient previously presented to ED, found to be incidentally COVID positive and also diagnosed w/ a UTI given labs and symptoms. She was discharged on supportive care for COVID and keflex for UTI. Staff have noticed over the following 48 hours the patient was more confused with questionable respiratory distress and EMS was called, patient brought to the ED; [facility] consulted for admission.  Assessment & Plan:   Acute hypoxic respiratory failure questionably symptomatic COVID-19, POA - Sats as low as 90% on room air at rest, patient is not on oxygen at baseline  -Transition to inpatient, continue paxlovid dexamethasone supportive care - CXR clear -procalcitonin negative, unlikely post COVID bacterial pneumonia, hold antibiotics -D-dimer minimally elevated, CTA negative for PE   Acute metabolic encephalopathy on baseline dementia secondary to Alzheimer's  -Improving, unclear baseline  New a fib, provoked secondary to above, POA -Diltiazem drip discontinued -plan to restart home metoprolol -Somewhat bradycardic today despite diltiazem being off overnight, may need to hold metoprolol until tonight or tomorrow - started on heparin gtt; continue   Recent UTI, POA -Continue Keflex x3 days to ensure completion of treatment  -Unclear if patient was able to take previous antibiotics at facility   HTN - Resume home metoprolol if pressures are appropriate tonight   HLD - hold statin d/t paxolvid   CKD3b - at baseline, watch nephrotoxins   Diet-controlled diabetes type 2 -Per chart review, A1c 5.5, transition to regular diet at this point appears very well controlled   DVT prophylaxis: heparin gtt  Code Status: FULL  Family  Communication: Listed number goes directly to voicemail -   Status is: Inpatient  Dispo: The patient is from: Facility              Anticipated d/c is to: Same              Anticipated d/c date is: 48 to 72 hours pending clinical course              Patient currently not medically stable for discharge  Consultants:  None  Procedures:  None  Antimicrobials:  Keflex x3 days Paxlovid x5 days   Subjective: No acute issues or events overnight, mental status appears to be improving somewhat; new of systems is somewhat limited still, patient answers "I do not know" to most questions occasionally shaking her head yes or no but not consistently.  Objective: Vitals:   08/10/21 0300 08/10/21 0400 08/10/21 0500 08/10/21 0600  BP: (!) 130/49 (!) 150/54 (!) 143/48 (!) 137/52  Pulse: (!) 53 (!) 53 (!) 54 (!) 50  Resp: 12 11 14 13   Temp:  (!) 97.4 F (36.3 C)    TempSrc:  Oral    SpO2: 96% 97% 96% 96%  Weight:      Height:        Intake/Output Summary (Last 24 hours) at 08/10/2021 0705 Last data filed at 08/10/2021 0400 Gross per 24 hour  Intake 157.02 ml  Output 700 ml  Net -542.98 ml   Filed Weights   08/09/21 1841  Weight: 74.1 kg    Examination:  General:  Pleasantly resting in bed, No acute distress. HEENT:  Normocephalic atraumatic.  Sclerae nonicteric, noninjected.  Extraocular movements intact bilaterally. Neck:  Without mass or deformity.  Trachea is midline. Lungs:  Clear to auscultate bilaterally without rhonchi, wheeze, or rales. Heart:  Regular rate and rhythm.  Without murmurs, rubs, or gallops. Abdomen:  Soft, nontender, nondistended.  Without guarding or rebound. Extremities: Without cyanosis, clubbing, edema, or obvious deformity. Vascular:  Dorsalis pedis and posterior tibial pulses palpable bilaterally. Skin:  Warm and dry, no erythema, no ulcerations.  Data Reviewed: I have personally reviewed following labs and imaging studies  CBC: Recent Labs  Lab  08/08/21 1038 08/09/21 1053 08/10/21 0300  WBC 8.6 8.8 6.1  NEUTROABS 5.7 6.3 4.5  HGB 13.8 14.8 14.3  HCT 43.3 46.7* 43.9  MCV 91.4 90.9 88.9  PLT 186 203 186   Basic Metabolic Panel: Recent Labs  Lab 08/08/21 1038 08/09/21 1053 08/10/21 0300  NA 134* 139 135  K 4.0 3.8 4.1  CL 100 99 98  CO2 22 25 26   GLUCOSE 114* 138* 157*  BUN 14 15 22   CREATININE 1.27* 1.17* 1.02*  CALCIUM 9.0 9.1 8.8*   GFR: Estimated Creatinine Clearance: 38.3 mL/min (A) (by C-G formula based on SCr of 1.02 mg/dL (H)). Liver Function Tests: Recent Labs  Lab 08/08/21 1038 08/09/21 1053 08/10/21 0300  AST 23 58* 46*  ALT 14 20 21   ALKPHOS 61 62 57  BILITOT 0.7 0.8 0.7  PROT 6.6 7.7 7.2  ALBUMIN 3.5 4.0 3.7   No results for input(s): LIPASE, AMYLASE in the last 168 hours. No results for input(s): AMMONIA in the last 168 hours. Coagulation Profile: Recent Labs  Lab 08/09/21 1053  INR 1.0   Cardiac Enzymes: No results for input(s): CKTOTAL, CKMB, CKMBINDEX, TROPONINI in the last 168 hours. BNP (last 3 results) No results for input(s): PROBNP in the last 8760 hours. HbA1C: Recent Labs    08/09/21 2007  HGBA1C 5.5   CBG: No results for input(s): GLUCAP in the last 168 hours. Lipid Profile: Recent Labs    08/09/21 1053  TRIG 81   Thyroid Function Tests: No results for input(s): TSH, T4TOTAL, FREET4, T3FREE, THYROIDAB in the last 72 hours. Anemia Panel: Recent Labs    08/09/21 1053 08/10/21 0300  FERRITIN 168 186   Sepsis Labs: Recent Labs  Lab 08/08/21 1213 08/09/21 1053  PROCALCITON  --  <0.10  LATICACIDVEN 1.7  --     Recent Results (from the past 240 hour(s))  Blood culture (routine x 2)     Status: None (Preliminary result)   Collection Time: 08/08/21 12:28 PM   Specimen: BLOOD  Result Value Ref Range Status   Specimen Description BLOOD RIGHT ANTECUBITAL  Final   Special Requests   Final    BOTTLES DRAWN AEROBIC AND ANAEROBIC Blood Culture adequate volume    Culture   Final    NO GROWTH < 24 HOURS Performed at [facility], [address] [place], [facility] [address]    Report Status PENDING  Incomplete  Blood culture (routine x 2)     Status: None (Preliminary result)   Collection Time: 08/08/21 12:28 PM   Specimen: BLOOD RIGHT WRIST  Result Value Ref Range Status   Specimen Description BLOOD RIGHT WRIST  Final   Special Requests   Final    BOTTLES DRAWN AEROBIC AND ANAEROBIC Blood Culture adequate volume   Culture   Final    NO GROWTH < 24 HOURS Performed at [facility], [address], [place], [facility] [address]    Report Status PENDING  Incomplete  Resp Panel by RT-PCR (Flu A&B, Covid) Urine, Clean Catch     Status: Abnormal   Collection Time: 08/08/21 12:30 PM   Specimen: Urine, Clean Catch; Nasopharyngeal(NP) swabs in vial transport medium  Result Value Ref Range Status   SARS Coronavirus 2 by RT PCR POSITIVE (A) NEGATIVE Final    Comment: RESULT CALLED TO, READ BACK BY AND VERIFIED WITH: RN E BANKS [id number] AT 1415 BY CM (NOTE) SARS-CoV-2 target nucleic acids are DETECTED.  The SARS-CoV-2 RNA is generally detectable in upper respiratory specimens during the acute phase of infection. Positive results are indicative of the presence of the identified virus, but do not rule out bacterial infection or co-infection with other pathogens not detected by the test. Clinical correlation with patient history and other diagnostic information is necessary to determine patient infection status. The expected result is Negative.  Fact Sheet for Patients: BloggerCourse.com  Fact Sheet for Healthcare Providers: SeriousBroker.it  This test is not yet approved or cleared by the [place] FDA and  has been authorized for detection and/or diagnosis of SARS-CoV-2 by FDA under an Emergency Use Authorization (EUA).  This EUA will remain in effect (meaning this test can be u sed)  for the duration of  the COVID-19 declaration under Section 564(b)(1) of the Act, 21 U.S.C. section 360bbb-3(b)(1), unless the authorization is terminated or revoked sooner.     Influenza A by PCR NEGATIVE NEGATIVE Final   Influenza B by PCR NEGATIVE NEGATIVE Final    Comment: (NOTE) The Xpert Xpress SARS-CoV-2/FLU/RSV plus assay is intended as an aid in the diagnosis of influenza from Nasopharyngeal swab specimens and should not be used as a sole basis for treatment. Nasal washings and aspirates are unacceptable for Xpert Xpress SARS-CoV-2/FLU/RSV testing.  Fact Sheet for Patients: BloggerCourse.com  Fact Sheet for Healthcare Providers: SeriousBroker.it  This test is not yet approved or cleared by the [place] FDA and has been authorized for detection and/or diagnosis of SARS-CoV-2 by FDA under an Emergency Use Authorization (EUA). This EUA will remain in effect (meaning this test can be used) for the duration of the COVID-19 declaration under Section 564(b)(1) of the Act, 21 U.S.C. section 360bbb-3(b)(1), unless the authorization is terminated or revoked.  Performed at [facility], [address] [place], [place] [address]   MRSA Next Gen by PCR, Nasal     Status: None   Collection Time: 08/10/21  1:36 AM   Specimen: Nasal Mucosa; Nasal Swab  Result Value Ref Range Status   MRSA by PCR Next Gen NOT DETECTED NOT DETECTED Final    Comment: (NOTE) The GeneXpert MRSA Assay (FDA approved for NASAL specimens only), is one component of a comprehensive MRSA colonization surveillance program. It is not intended to diagnose MRSA infection nor to guide or monitor treatment for MRSA infections. Test performance is not FDA approved in patients less than 10 years old. Performed at [facility], [address], [place], [place] [address]     Radiology Studies: DG Chest 2 View  Result Date:  08/08/2021 CLINICAL DATA:  Decreased breath sounds on the right EXAM: CHEST - 2 VIEW COMPARISON:  Chest radiograph 03/19/2021 FINDINGS: The heart is at the upper limits of normal for size. The mediastinum is prominent, exaggerated by AP technique and rightward patient rotation. There is minimal bibasilar subsegmental atelectasis. There is no focal consolidation or pulmonary edema. There is no pleural effusion or pneumothorax. There is mild degenerative change of the thoracic spine. A  catheter projecting over the right neck is favored to be external to the patient. IMPRESSION: Minimal bibasilar subsegmental atelectasis and borderline cardiomegaly. Otherwise, no radiographic evidence of acute cardiopulmonary process. Electronically Signed   By: Lesia Hausen M.D.   On: 08/08/2021 11:54   CT Head Wo Contrast  Result Date: 08/08/2021 CLINICAL DATA:  Delirium, weakness, altered mental status EXAM: CT HEAD WITHOUT CONTRAST TECHNIQUE: Contiguous axial images were obtained from the base of the skull through the vertex without intravenous contrast. COMPARISON:  Brain MRI 12/12/2014 CT head 03/19/2021 FINDINGS: Brain: There is no evidence of acute intracranial hemorrhage, extra-axial fluid collection, or infarct. There is global parenchymal volume loss with commensurate enlargement of the ventricular system, unchanged. Foci of hypodensity in the subcortical and periventricular white matter likely reflect sequela of chronic white matter microangiopathy, also not significantly changed. 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 There is no mass lesion.  There is no midline shift. Vascular: There is calcification of the bilateral cavernous ICAs. Skull: Normal. Negative for fracture or focal lesion. Sinuses/Orbits: There is mild mucosal thickening in the paranasal sinuses. Bilateral lens implants are in place. The globes and orbits are otherwise unremarkable. Other: None. IMPRESSION: 1. No acute  intracranial pathology. 2. Unchanged global parenchymal volume loss and chronic white matter microangiopathy Electronically Signed   By: Lesia Hausen M.D.   On: 08/08/2021 11:41   CT Angio Chest Pulmonary Embolism (PE) W or WO Contrast  Result Date: 08/09/2021 CLINICAL DATA:  PE suspected, low/intermediate prob, positive D-dimer Confusion.  Hypoxia.  Respiratory distress. EXAM: CT ANGIOGRAPHY CHEST WITH CONTRAST TECHNIQUE: Multidetector CT imaging of the chest was performed using the standard protocol during bolus administration of intravenous contrast. Multiplanar CT image reconstructions and MIPs were obtained to evaluate the vascular anatomy. CONTRAST:  21mL OMNIPAQUE IOHEXOL 350 MG/ML SOLN COMPARISON:  Radiograph earlier today.  Remote chest CT 12/11/2011 FINDINGS: Cardiovascular: There are no filling defects within the pulmonary arteries to suggest pulmonary embolus. Aortic atherosclerosis and tortuosity. Can not assess for dissection given phase of contrast tailored to pulmonary artery evaluation. The heart is normal in size. There are coronary artery calcifications. No pericardial effusion. Mediastinum/Nodes: No enlarged mediastinal or hilar lymph nodes. No esophageal wall thickening. No visualized thyroid nodule. Lungs/Pleura: Breathing motion artifact partially obscures detailed assessment. There is mild central bronchial thickening with occasional areas of mucous plugging in the lower lobes. Right greater than left lower lobe atelectasis. No confluent airspace disease or evidence of pneumonia. Basilar assessment is particularly limited due to motion. No findings of pulmonary edema. No pleural fluid. No pulmonary mass. Upper Abdomen: No acute or unexpected findings. Musculoskeletal: Slightly exaggerated upper thoracic kyphosis. There are no acute or suspicious osseous abnormalities. Remote right posterior eleventh rib fracture with callus. Review of the MIP images confirms the above findings.  IMPRESSION: 1. No pulmonary embolus. 2. Mild central bronchial thickening with occasional areas of mucous plugging in the lower lobes. Right greater than left lower lobe atelectasis. 3. Aortic atherosclerosis.  Coronary artery calcifications. Aortic Atherosclerosis (ICD10-I70.0). Electronically Signed   By: Narda Rutherford M.D.   On: 08/09/2021 23:13   DG Chest Port 1 View  Result Date: 08/09/2021 CLINICAL DATA:  COVID-19 positive. EXAM: PORTABLE CHEST 1 VIEW COMPARISON:  August 08, 2021. FINDINGS: Stable cardiomediastinal silhouette. Both lungs are clear. The visualized skeletal structures are unremarkable. IMPRESSION: No active disease. Electronically Signed   By: Lupita Raider M.D.   On: 08/09/2021 12:44  Scheduled Meds:  vitamin C  500 mg Oral Daily   aspirin EC  81 mg Oral Daily   calcium-vitamin D  2 tablet Oral Q breakfast   cephALEXin  500 mg Oral Q8H   Chlorhexidine Gluconate Cloth  6 each Topical Daily   dexamethasone (DECADRON) injection  6 mg Intravenous Q24H   docusate sodium  100 mg Oral BID   mouth rinse  15 mL Mouth Rinse BID   melatonin  3 mg Oral QHS   memantine  10 mg Oral BID   metoprolol tartrate  25 mg Oral BID   nirmatrelvir/ritonavir EUA (renal dosing)  2 tablet Oral BID   sodium chloride flush  3 mL Intravenous Q12H   vitamin B-12  1,000 mcg Oral BID   zinc sulfate  220 mg Oral Daily   Continuous Infusions:  sodium chloride     diltiazem (CARDIZEM) infusion Stopped (08/09/21 2027)   heparin 1,000 Units/hr (08/10/21 0000)     LOS: 0 days   Time spent: 35 min  Azucena Fallen, DO [facility]  If 7PM-7AM, please contact night-coverage [email or web address]  08/10/2021, 7:05 AM

## 2021-08-10 NOTE — Evaluation (Signed)
Physical Therapy Evaluation Patient Details Name: [name] MRN: [id number] DOB: [birth year] Today's Date: 08/10/2021  History of Present Illness  87 y.o. female admitted on 08/09/21 for increased confusion.  Pt dx with COVID 19, UTI, new onset A-fib.  Pt with significant PMH of Alzheimer's dementia, anemia, CKD 3, DM (diet controlled), HTN, OA (hand and L knee pain), syncope, L knee arthroscopy, ORIF R humerus fx.  Clinical Impression  Limited EOB evaluation, pt reported being too fatigued to try to stand up once she was sitting even though she asked to go to the bathroom at the beginning of our session.  She did maintain sats at 92% or higher while on RA during limited mobility and sounded to have an occasional productive cough.  HR 50-60s.  Pt seems to be weaker than her baseline (she reports she was an independent ambulator).  RN CM note reports family would like to take her back to [place], [place] at discharge.  Chart review indicates she lives at [facility] currently.   PT to follow acutely for deficits listed below.   I don't think she would be strong enough to d/c to the community yet, but could progress that direction if she starts to feel better.      Recommendations for follow up therapy are one component of a multi-disciplinary discharge planning process, led by the attending physician.  Recommendations may be updated based on patient status, additional functional criteria and insurance authorization.  Follow Up Recommendations SNF    Equipment Recommendations  Rolling walker with 5" wheels    Recommendations for Other Services       Precautions / Restrictions Precautions Precautions: Fall;Other (comment) Precaution Comments: COVID contact precautions, monitor O2, vitals      Mobility  Bed Mobility Overal bed mobility: Needs Assistance Bed Mobility: Supine to Sit;Sit to Supine     Supine to sit: Min assist;HOB elevated Sit to supine: Mod assist;HOB elevated    General bed mobility comments: Min assist to come to sitting EOB.  Pt able to move legs over the side, needed help to pull up to sitting (refused to use her right hand due to reported finger pain, it was the finger with the pulse ox on it, so not sure of the accuracy of the pain report) with her left hand.  Assist needed to lift both feet back into bed to return to supine.    Transfers                 General transfer comment: Pt reported she was too weak to stand even with encouragement she laid back down before I could convince her to stand up EOB.  Ambulation/Gait                Stairs            Wheelchair Mobility    Modified Rankin (Stroke Patients Only)       Balance Overall balance assessment: Needs assistance Sitting-balance support: Feet supported;Bilateral upper extremity supported Sitting balance-Leahy Scale: Poor Sitting balance - Comments: min to mod assist.  In sitting pt wanted to lean her head against my trunk to rest.  She reported feeling very fatigued.  "I just don't feel good".                                     Pertinent Vitals/Pain Pain Assessment: Faces Faces Pain Scale: Hurts  little more Pain Location: right hand 2nd (pointer) finger reports is painful (this is the finger the pulse ox is attached). Pain Descriptors / Indicators: Grimacing;Guarding Pain Intervention(s): Limited activity within patient's tolerance;Monitored during session;Repositioned    Home Living Family/patient expects to be discharged to:: Assisted living (chart says she is from [facility])                 Additional Comments: pt is a poor historian.    Prior Function Level of Independence: Independent         Comments: per pt (again, questionable historian) she did not use O2 or RW before.     Hand Dominance        Extremity/Trunk Assessment   Upper Extremity Assessment Upper Extremity Assessment: Defer to OT evaluation  (h/o R UE ORIF)    Lower Extremity Assessment Lower Extremity Assessment: RLE deficits/detail;LLE deficits/detail RLE Deficits / Details: per gross functional assessment pt 3-/5 and equal bil legs. No significant assymetries noted. LLE Deficits / Details: per gross functional assessment pt 3-/5 and equal bil legs. No significant assymetries noted.    Cervical / Trunk Assessment Cervical / Trunk Assessment: Normal  Communication   Communication: HOH (mildly)  Cognition Arousal/Alertness: Awake/alert Behavior During Therapy: WFL for tasks assessed/performed Overall Cognitive Status: No family/caregiver present to determine baseline cognitive functioning                                 General Comments: History of Alzheimer's dementia, chart reports caregivers state that she was more confused and no one present to report baseline.  Pt is also out of her normal environment which tends to increase confusion as well.      General Comments General comments (skin integrity, edema, etc.): HR 56-62 during limited mobility and O2 sats lowest on RA was 92%, 2L O2 [place] re-applied at end of session.    Exercises     Assessment/Plan    PT Assessment Patient needs continued PT services  PT Problem List Decreased strength;Decreased activity tolerance;Decreased balance;Decreased mobility;Decreased cognition;Decreased knowledge of use of DME;Cardiopulmonary status limiting activity;Decreased safety awareness;Decreased knowledge of precautions       PT Treatment Interventions DME instruction;Gait training;Stair training;Functional mobility training;Therapeutic activities;Therapeutic exercise;Balance training;Patient/family education;Cognitive remediation    PT Goals (Current goals can be found in the Care Plan section)  Acute Rehab PT Goals Patient Stated Goal: Pt wanted to get up to the [facility], but felt to weak once sitting EOB.  Sounds like RN CM's note states family wants to take her  home to [place], [place] PT Goal Formulation: Patient unable to participate in goal setting Time For Goal Achievement: 08/24/21 Potential to Achieve Goals: Good    Frequency Min 3X/week   Barriers to discharge        Co-evaluation               AM-PAC PT "6 Clicks" Mobility  Outcome Measure Help needed turning from your back to your side while in a flat bed without using bedrails?: A Little Help needed moving from lying on your back to sitting on the side of a flat bed without using bedrails?: A Little Help needed moving to and from a bed to a chair (including a wheelchair)?: A Lot Help needed standing up from a chair using your arms (e.g., wheelchair or bedside chair)?: A Lot Help needed to walk in hospital room?: A Lot Help needed climbing 3-5  steps with a railing? : Total 6 Click Score: 13    End of Session Equipment Utilized During Treatment: Oxygen Activity Tolerance: Patient limited by fatigue Patient left: in bed;with call bell/phone within reach;with bed alarm set Nurse Communication: Mobility status PT Visit Diagnosis: Muscle weakness (generalized) (M62.81);Difficulty in walking, not elsewhere classified (R26.2)    Time: 0258-5277 PT Time Calculation (min) (ACUTE ONLY): 21 min   Charges:   PT Evaluation $PT Eval Moderate Complexity: 1 Mod         Corinna Capra, PT, DPT  Acute Rehabilitation Ortho Tech Supervisor [phone] pager [phone]) [phone] office

## 2021-08-10 NOTE — Progress Notes (Signed)
Pharmacy Brief Note - Anticoagulation Follow Up:  Pharmacy consulted to dose/monitor heparin drip for atrial fibrillation in this 89 yoF. For full history, see note by Perlie Gold, PharmD from earlier today.   Assessment: HL = 0.41 is therapeutic on heparin infusion of 850 units/hr Confirmed with RN that heparin infusing at correct rate, but infusion sometimes interrupted due to patient bending arm. No signs of bleeding.   Goal HL: 0.3 - 0.7  Plan: Continue heparin infusion at current rate of 850 units/hr Check confirmatory HL HL, CBC daily while on heparin drip Monitor for signs of bleeding  Cindi Carbon, PharmD 08/10/21 7:27 PM

## 2021-08-10 NOTE — Progress Notes (Signed)
Patient was successfully transferred to [facility] after calling report to [place], Charity fundraiser. All questions answered at this time. Patient was transported in the bed by RN while on telemetry. All patient personal belongings transported with patient as well as paper chart. [facility] will take over care at this time and continue to monitor the patient at this time.

## 2021-08-10 NOTE — Progress Notes (Signed)
ANTICOAGULATION CONSULT NOTE - follow up  Pharmacy Consult for heparin Indication: atrial fibrillation  Allergies  Allergen Reactions   Sulfa Antibiotics Hives, Diarrhea, Nausea And Vomiting and Swelling    Facial swelling ALSO   Latex Rash    Patient Measurements: Height: 5\' 4"  (162.6 cm) Weight: 74.1 kg (163 lb 5.8 oz) IBW/kg (Calculated) : 54.7 Heparin Dosing Weight: 68.6 kg  Vital Signs: Temp: 97.4 F (36.3 C) (09/20 0400) Temp Source: Oral (09/20 0400) BP: 137/52 (09/20 0600) Pulse Rate: 50 (09/20 0600)  Labs: Recent Labs    08/08/21 1038 08/09/21 1053 08/09/21 2233 08/10/21 0300 08/10/21 0640  HGB 13.8 14.8  --  14.3  --   HCT 43.3 46.7*  --  43.9  --   PLT 186 203  --  186  --   APTT  --  28  --   --   --   LABPROT  --  13.1  --   --   --   INR  --  1.0  --   --   --   HEPARINUNFRC  --   --  0.65  --  0.84*  CREATININE 1.27* 1.17*  --  1.02*  --     Estimated Creatinine Clearance: 38.3 mL/min (A) (by C-G formula based on SCr of 1.02 mg/dL (H)).  Medical History: Past Medical History:  Diagnosis Date   Allergies    Alzheimer disease (HCC)    STABLE   Anemia, iron deficiency 12/12/2011   Arthritis    RF   CKD (chronic kidney disease)    STAGE 3   Diabetes mellitus    no mes, diet only    Diarrhea    RESOLVED   Hypercholesterolemia    Hyperlipemia    Hypertension    Major depression    OA (osteoarthritis)    HAND/LEFT KNEE PAIN MILD   Osteoporosis    Syncope     Medications:  Scheduled:   vitamin C  500 mg Oral Daily   aspirin EC  81 mg Oral Daily   calcium-vitamin D  2 tablet Oral Q breakfast   cephALEXin  500 mg Oral Q8H   Chlorhexidine Gluconate Cloth  6 each Topical Daily   dexamethasone (DECADRON) injection  6 mg Intravenous Q24H   docusate sodium  100 mg Oral BID   mouth rinse  15 mL Mouth Rinse BID   melatonin  3 mg Oral QHS   memantine  10 mg Oral BID   metoprolol tartrate  25 mg Oral BID   nirmatrelvir/ritonavir EUA (renal  dosing)  2 tablet Oral BID   sodium chloride flush  3 mL Intravenous Q12H   vitamin B-12  1,000 mcg Oral BID   zinc sulfate  220 mg Oral Daily    Assessment: 87 yo female presenting with cough and shortness of breath, COVID positive.  Found to have been in Afib with RVR while in emergency room - new diagnosis.  No anticoagulation noted PTA, none received thus far this admit. D-dimer elevated at 1.13 and fibrinogen also elevated at 521.  Hemoglobin and platelets stable.  Baseline INR 1, aPTT 28 seconds  9/19 pm: HL 0.65 therapeutic on 1000 units/hr Per RN no bleeding  Goal of Therapy:  Heparin level 0.3-0.7 units/ml Monitor platelets by anticoagulation protocol: Yes  Today, 08/10/2021 0640 HL 0.84 units/ml, sl above desired range    Plan:  Reduce heparin drip to 8500 units/hr Confirmatory heparin level in 8 hours Daily CBC, daily Heparin  level at steady state Await decision for possible oral anti-coag  Otho Bellows PharmD [facility] [phone] 08/10/2021, 9:00 AM

## 2021-08-11 ENCOUNTER — Inpatient Hospital Stay (HOSPITAL_COMMUNITY): Payer: Medicare Other

## 2021-08-11 DIAGNOSIS — I4891 Unspecified atrial fibrillation: Secondary | ICD-10-CM

## 2021-08-11 LAB — COMPREHENSIVE METABOLIC PANEL
ALT: 20 U/L (ref 0–44)
AST: 42 U/L — ABNORMAL HIGH (ref 15–41)
Albumin: 3.5 g/dL (ref 3.5–5.0)
Alkaline Phosphatase: 64 U/L (ref 38–126)
Anion gap: 10 (ref 5–15)
BUN: 30 mg/dL — ABNORMAL HIGH (ref 8–23)
CO2: 25 mmol/L (ref 22–32)
Calcium: 9.2 mg/dL (ref 8.9–10.3)
Chloride: 105 mmol/L (ref 98–111)
Creatinine, Ser: 1.24 mg/dL — ABNORMAL HIGH (ref 0.44–1.00)
GFR, Estimated: 42 mL/min — ABNORMAL LOW (ref >60)
Glucose, Bld: 164 mg/dL — ABNORMAL HIGH (ref 70–99)
Potassium: 4.3 mmol/L (ref 3.5–5.1)
Sodium: 140 mmol/L (ref 135–145)
Total Bilirubin: 0.5 mg/dL (ref 0.3–1.2)
Total Protein: 7.1 g/dL (ref 6.5–8.1)

## 2021-08-11 LAB — ECHOCARDIOGRAM COMPLETE
Area-P 1/2: 2.16 cm2
Height: 64 in
S' Lateral: 2.8 cm
Single Plane A4C EF: 69.1 %
Weight: 2613.77 oz

## 2021-08-11 LAB — CBC WITH DIFFERENTIAL/PLATELET
Abs Immature Granulocytes: 0.05 10*3/uL (ref 0.00–0.07)
Basophils Absolute: 0 10*3/uL (ref 0.0–0.1)
Basophils Relative: 0 %
Eosinophils Absolute: 0 10*3/uL (ref 0.0–0.5)
Eosinophils Relative: 0 %
HCT: 42.4 % (ref 36.0–46.0)
Hemoglobin: 14.1 g/dL (ref 12.0–15.0)
Immature Granulocytes: 1 %
Lymphocytes Relative: 14 %
Lymphs Abs: 1.4 10*3/uL (ref 0.7–4.0)
MCH: 29.3 pg (ref 26.0–34.0)
MCHC: 33.3 g/dL (ref 30.0–36.0)
MCV: 88 fL (ref 80.0–100.0)
Monocytes Absolute: 0.5 10*3/uL (ref 0.1–1.0)
Monocytes Relative: 4 %
Neutro Abs: 8.3 10*3/uL — ABNORMAL HIGH (ref 1.7–7.7)
Neutrophils Relative %: 81 %
Platelets: 196 10*3/uL (ref 150–400)
RBC: 4.82 MIL/uL (ref 3.87–5.11)
RDW: 13.2 % (ref 11.5–15.5)
WBC: 10.2 10*3/uL (ref 4.0–10.5)
nRBC: 0 % (ref 0.0–0.2)

## 2021-08-11 LAB — HEPARIN LEVEL (UNFRACTIONATED)
Heparin Unfractionated: 0.51 IU/mL (ref 0.30–0.70)
Heparin Unfractionated: 0.67 IU/mL (ref 0.30–0.70)

## 2021-08-11 LAB — C-REACTIVE PROTEIN: CRP: 2.3 mg/dL — ABNORMAL HIGH (ref <1.0)

## 2021-08-11 MED ORDER — CEPHALEXIN 500 MG PO CAPS
500.0000 mg | ORAL_CAPSULE | Freq: Two times a day (BID) | ORAL | Status: AC
  Administered 2021-08-11 – 2021-08-14 (×7): 500 mg via ORAL
  Filled 2021-08-11 (×7): qty 1

## 2021-08-11 MED ORDER — SERTRALINE HCL 100 MG PO TABS
100.0000 mg | ORAL_TABLET | Freq: Every day | ORAL | Status: DC
  Administered 2021-08-11 – 2021-08-15 (×5): 100 mg via ORAL
  Filled 2021-08-11 (×5): qty 1

## 2021-08-11 MED ORDER — DEXAMETHASONE 4 MG PO TABS
6.0000 mg | ORAL_TABLET | Freq: Every day | ORAL | Status: DC
  Administered 2021-08-11 – 2021-08-12 (×2): 6 mg via ORAL
  Filled 2021-08-11 (×2): qty 1

## 2021-08-11 NOTE — Progress Notes (Signed)
ANTICOAGULATION CONSULT NOTE - follow up  Pharmacy Consult for heparin Indication: atrial fibrillation  Allergies  Allergen Reactions   Sulfa Antibiotics Hives, Diarrhea, Nausea And Vomiting and Swelling    Facial swelling ALSO   Latex Rash    Patient Measurements: Height: 5\' 4"  (162.6 cm) Weight: 74.1 kg (163 lb 5.8 oz) IBW/kg (Calculated) : 54.7 Heparin Dosing Weight: 68.6 kg  Vital Signs: Temp: 98.4 F (36.9 C) (09/20 2045) Temp Source: Oral (09/20 2045) BP: 119/65 (09/20 2045) Pulse Rate: 58 (09/20 2045)  Labs: Recent Labs    08/09/21 1053 08/09/21 2233 08/10/21 0300 08/10/21 0640 08/10/21 1652 08/10/21 2339 08/11/21 0017  HGB 14.8  --  14.3  --   --   --  14.1  HCT 46.7*  --  43.9  --   --   --  42.4  PLT 203  --  186  --   --   --  196  APTT 28  --   --   --   --   --   --   LABPROT 13.1  --   --   --   --   --   --   INR 1.0  --   --   --   --   --   --   HEPARINUNFRC  --    < >  --  0.84* 0.41 0.51  --   CREATININE 1.17*  --  1.02*  --   --   --  1.24*   < > = values in this interval not displayed.    Estimated Creatinine Clearance: 31.5 mL/min (A) (by C-G formula based on SCr of 1.24 mg/dL (H)).  Medical History: Past Medical History:  Diagnosis Date   Allergies    Alzheimer disease (HCC)    STABLE   Anemia, iron deficiency 12/12/2011   Arthritis    RF   CKD (chronic kidney disease)    STAGE 3   Diabetes mellitus    no mes, diet only    Diarrhea    RESOLVED   Hypercholesterolemia    Hyperlipemia    Hypertension    Major depression    OA (osteoarthritis)    HAND/LEFT KNEE PAIN MILD   Osteoporosis    Syncope     Medications:  Scheduled:   vitamin C  500 mg Oral Daily   aspirin EC  81 mg Oral Daily   calcium-vitamin D  2 tablet Oral Q breakfast   cephALEXin  500 mg Oral Q8H   Chlorhexidine Gluconate Cloth  6 each Topical Daily   dexamethasone (DECADRON) injection  6 mg Intravenous Q24H   docusate sodium  100 mg Oral BID   mouth  rinse  15 mL Mouth Rinse BID   melatonin  3 mg Oral QHS   memantine  10 mg Oral BID   metoprolol tartrate  25 mg Oral BID   nirmatrelvir/ritonavir EUA (renal dosing)  2 tablet Oral BID   sodium chloride flush  3 mL Intravenous Q12H   vitamin B-12  1,000 mcg Oral BID   zinc sulfate  220 mg Oral Daily    Assessment: 87 yo female presenting with cough and shortness of breath, COVID positive.  Found to have been in Afib with RVR while in emergency room - new diagnosis.  No anticoagulation noted PTA, none received thus far this admit. D-dimer elevated at 1.13 and fibrinogen also elevated at 521.  Hemoglobin and platelets stable.  Baseline INR 1, aPTT  28 seconds  08/11/2021 HL 0.51, therapeutic on 850 units/hr CBC WNL No bleeding noted   Goal of Therapy:  Heparin level 0.3-0.7 units/ml Monitor platelets by anticoagulation protocol: Yes    Plan:  Continue heparin drip at 850 units/hr Daily CBC, daily Heparin level at steady state Await decision for possible oral anti-coag  Arley Phenix RPh 08/11/2021, 12:50 AM

## 2021-08-11 NOTE — TOC Progression Note (Signed)
Transition of Care [facility]) - Progression Note    Patient Details  Name: DANYETTA GILLHAM MRN: [id number] Date of Birth: 1932/03/02  [facility] Care [facility] CM/SW Contact  Geni Bers, RN Phone Number: 08/11/2021, 3:33 PM  Clinical Narrative:     Pt is from [facility]. TOC will continue to follow for discharge. One of [facility] retired RN's.  Expected Discharge Plan: Assisted Living Barriers to Discharge: No Barriers Identified  Expected Discharge Plan and Services Expected Discharge Plan: Assisted Living   Discharge Planning Services: CM Consult   Living arrangements for the past 2 months: Assisted Living Facility                                       Social Determinants of Health (SDOH) Interventions    Readmission Risk Interventions No flowsheet data found.

## 2021-08-11 NOTE — Progress Notes (Signed)
ANTICOAGULATION CONSULT NOTE - follow up  Pharmacy Consult for IV heparin Indication: atrial fibrillation  Allergies  Allergen Reactions   Sulfa Antibiotics Hives, Diarrhea, Nausea And Vomiting and Swelling    Facial swelling ALSO   Latex Rash    Patient Measurements: Height: 5\' 4"  (162.6 cm) Weight: 74.1 kg (163 lb 5.8 oz) IBW/kg (Calculated) : 54.7 Heparin Dosing Weight: 70.1 kg  Vital Signs: Temp: 97.5 F (36.4 C) (09/21 0857) Temp Source: Oral (09/21 0857) BP: 144/66 (09/21 0857) Pulse Rate: 53 (09/21 0857)  Labs: Recent Labs    08/09/21 1053 08/09/21 2233 08/10/21 0300 08/10/21 0640 08/10/21 1652 08/10/21 2339 08/11/21 0017 08/11/21 0849  HGB 14.8  --  14.3  --   --   --  14.1  --   HCT 46.7*  --  43.9  --   --   --  42.4  --   PLT 203  --  186  --   --   --  196  --   APTT 28  --   --   --   --   --   --   --   LABPROT 13.1  --   --   --   --   --   --   --   INR 1.0  --   --   --   --   --   --   --   HEPARINUNFRC  --    < >  --    < > 0.41 0.51  --  0.67  CREATININE 1.17*  --  1.02*  --   --   --  1.24*  --    < > = values in this interval not displayed.    Estimated Creatinine Clearance: 31.5 mL/min (A) (by C-G formula based on SCr of 1.24 mg/dL (H)).  Medical History: Past Medical History:  Diagnosis Date   Allergies    Alzheimer disease (HCC)    STABLE   Anemia, iron deficiency 12/12/2011   Arthritis    RF   CKD (chronic kidney disease)    STAGE 3   Diabetes mellitus    no mes, diet only    Diarrhea    RESOLVED   Hypercholesterolemia    Hyperlipemia    Hypertension    Major depression    OA (osteoarthritis)    HAND/LEFT KNEE PAIN MILD   Osteoporosis    Syncope      Assessment: 87 yo female presenting with cough and shortness of breath, COVID positive.  Found to have been in Afib with RVR while in emergency room - new diagnosis.  No anticoagulation noted PTA. D-dimer elevated at 1.13 and fibrinogen also elevated at 521. Hemoglobin  and platelets stable. Baseline INR 1, aPTT 28 seconds. Pharmacy consulted for IV heparin dosing.   Today, 08/11/21:  Heparin level = 0.67 units/mL, remains therapeutic  CBC WNL No bleeding or infusion issues noted per nursing  Goal of Therapy:  Heparin level 0.3-0.7 units/ml Monitor platelets by anticoagulation protocol: Yes    Plan:  Continue heparin infusion at 850 units/hr Daily CBC, heparin level Monitor closely for s/sx of bleeding Await decision for possible oral anti-coag   08/13/21, PharmD, BCPS Clinical Pharmacist  08/11/2021, 9:54 AM

## 2021-08-11 NOTE — Progress Notes (Addendum)
Physical Therapy Treatment Patient Details Name: [name] MRN: [id number] DOB: September 06, 1932 Today's Date: 08/11/2021   History of Present Illness 87 y.o. female admitted on 08/09/21 for increased confusion.  Pt dx with COVID 19, UTI, new onset A-fib.  Pt with significant PMH of Alzheimer's dementia, anemia, CKD 3, DM (diet controlled), HTN, OA (hand and L knee pain), syncope, L knee arthroscopy, ORIF R humerus fx.    PT Comments    Pt much more alert, brighter affect today compared to yesterday.  We were able to get up and move around the room (better/more stable with RW) and participate in seated chair exercises.   Recommendations for follow up therapy are one component of a multi-disciplinary discharge planning process, led by the attending physician.  Recommendations may be updated based on patient status, additional functional criteria and insurance authorization.  Follow Up Recommendations  HH at ALF, supervision for OOB mobility.      Equipment Recommendations  Rolling walker with 5" wheels    Recommendations for Other Services       Precautions / Restrictions Precautions Precautions: Fall Precaution Comments: COVID contact precautions, monitor O2, vitals Restrictions Weight Bearing Restrictions: No     Mobility  Bed Mobility               General bed mobility comments: Pt is OOB in the recliner chair.    Transfers Overall transfer level: Needs assistance Equipment used: Rolling walker (2 wheeled);1 person hand held assist Transfers: Sit to/from Stand Sit to Stand: Min assist         General transfer comment: Heavy min assist without the RW, switched over to RW after seeing how unsteady she was on her feet and was lighter min assist.  Cues for safe handplacement did not work, she continued to pull up on the RW.  Ambulation/Gait Ambulation/Gait assistance: Min assist Gait Distance (Feet): 15 Feet Assistive device: Rolling walker (2 wheeled) Gait  Pattern/deviations: Step-through pattern;Staggering left;Staggering right     General Gait Details: Pt with staggering gait pattern, difficulty walking backwards and turning.  Limited space as we had to stay in the room d/t COVID status.  Appears weaker than normal, but significantly better than yesterday's session.   Stairs             Wheelchair Mobility    Modified Rankin (Stroke Patients Only)       Balance Overall balance assessment: Needs assistance Sitting-balance support: Feet supported;Bilateral upper extremity supported;No upper extremity supported Sitting balance-Leahy Scale: Fair     Standing balance support: Bilateral upper extremity supported Standing balance-Leahy Scale: Poor Standing balance comment: needs support from therapist and RW.                            Cognition Arousal/Alertness: Awake/alert Behavior During Therapy: WFL for tasks assessed/performed Overall Cognitive Status: No family/caregiver present to determine baseline cognitive functioning                                 General Comments: History of Alzheimer's dementia, chart reports caregivers state that she was more confused and no one present to report baseline.  Pt is also out of her normal environment which tends to increase confusion as well. Patient alert to self only.      Exercises General Exercises - Upper Extremity Shoulder Flexion: AROM;Both;10 reps;Seated Elbow Flexion: AROM;Both;10 reps;Seated General Exercises -  Lower Extremity Ankle Circles/Pumps: AROM;Both;20 reps;Seated Long Arc Quad: AROM;Both;10 reps;Seated Hip Flexion/Marching: AROM;Both;10 reps;Seated    General Comments        Pertinent Vitals/Pain Pain Assessment: No/denies pain Faces Pain Scale: No hurt    Home Living Family/patient expects to be discharged to:: Assisted living (chart says she is from [facility])               Additional Comments: pt is a poor  historian.    Prior Function Level of Independence: Independent      Comments: per pt (again, questionable historian) she did not use O2 or RW before.   PT Goals (current goals can now be found in the care plan section) Acute Rehab PT Goals Patient Stated Goal: Pt much brighter affect today, less confused. Progress towards PT goals: Progressing toward goals    Frequency    Min 3X/week      PT Plan Current plan remains appropriate    Co-evaluation              AM-PAC PT "6 Clicks" Mobility   Outcome Measure  Help needed turning from your back to your side while in a flat bed without using bedrails?: A Little Help needed moving from lying on your back to sitting on the side of a flat bed without using bedrails?: A Little Help needed moving to and from a bed to a chair (including a wheelchair)?: A Little Help needed standing up from a chair using your arms (e.g., wheelchair or bedside chair)?: A Little Help needed to walk in hospital room?: A Little Help needed climbing 3-5 steps with a railing? : A Little 6 Click Score: 18    End of Session   Activity Tolerance: Patient tolerated treatment well Patient left: in chair;with call bell/phone within reach;with chair alarm set   PT Visit Diagnosis: Muscle weakness (generalized) (M62.81);Difficulty in walking, not elsewhere classified (R26.2)     Time: 1497-0263 PT Time Calculation (min) (ACUTE ONLY): 17 min  Charges:  $Therapeutic Activity: 8-22 mins                    Corinna Capra, PT, DPT  Acute Rehabilitation Ortho Tech Supervisor [phone] pager [phone]) [phone] office

## 2021-08-11 NOTE — Evaluation (Signed)
Occupational Therapy Evaluation Patient Details Name: [name] MRN: [id number] DOB: 1932/10/25 Today's Date: 08/11/2021   History of Present Illness 87 y.o. female admitted on 08/09/21 for increased confusion.  Pt dx with COVID 19, UTI, new onset A-fib.  Pt with significant PMH of Alzheimer's dementia, anemia, CKD 3, DM (diet controlled), HTN, OA (hand and L knee pain), syncope, L knee arthroscopy, ORIF R humerus fx.   Clinical Impression   Mrs. [name] is an 87 year old woman who presents with impaired balance, confusion and decreased activity tolerance resulting in a sudden decline in her functional abilities. Patient is pleasant but oriented to self only with poor short term memory. On evaluation she needs min assist to stand and take steps, mod assist for LB dressing and max assist for toileting as well as setup assist for UB ADLs. Patient's mobility limited to just chair transfer today. Therapist unsure of her prior mobility level. Patient will benefit from skilled OT services while in hospital to improve deficits and learn compensatory strategies as needed to improve functional abilities and reduce caregiver burden. Recommend return to ALF. There is mention that family wants to take patient home. She will need near 24/7 assistance at discharge from family as she is a high fall risk.      Recommendations for follow up therapy are one component of a multi-disciplinary discharge planning process, led by the attending physician.  Recommendations may be updated based on patient status, additional functional criteria and insurance authorization.   Follow Up Recommendations  Home health OT;Supervision/Assistance - 24 hour    Equipment Recommendations  None recommended by OT    Recommendations for Other Services       Precautions / Restrictions Precautions Precautions: Fall Precaution Comments: COVID contact precautions, monitor O2, vitals Restrictions Weight Bearing  Restrictions: No      Mobility Bed Mobility Overal bed mobility: Needs Assistance Bed Mobility: Supine to Sit     Supine to sit: Supervision;HOB elevated     General bed mobility comments: Supervision to transfer in to sitting.    Transfers Overall transfer level: Needs assistance Equipment used: None Transfers: Sit to/from [employer] Sit to Stand: Min assist Stand pivot transfers: Min assist       General transfer comment: MIn assist to stand and patient immediately unsteady. Min assist to take steps to the recliner with patient using two hand holds.    Balance Overall balance assessment: Needs assistance Sitting-balance support: No upper extremity supported Sitting balance-Leahy Scale: Fair     Standing balance support: Bilateral upper extremity supported Standing balance-Leahy Scale: Poor Standing balance comment: needs support from therapist and RW.                           ADL either performed or assessed with clinical judgement   ADL Overall ADL's : Needs assistance/impaired Eating/Feeding: Set up;Sitting   Grooming: Set up;Sitting;Oral care;Wash/dry face   Upper Body Bathing: Set up;Sitting;Cueing for sequencing   Lower Body Bathing: Set up;Sitting/lateral leans;Cueing for sequencing   Upper Body Dressing : Set up;Sitting;Cueing for sequencing   Lower Body Dressing: Sit to/from stand;Moderate assistance;Cueing for sequencing   Toilet Transfer: Minimal assistance;BSC;Stand-pivot   Toileting- Clothing Manipulation and Hygiene: Maximal assistance;Sit to/from stand               Vision   Vision Assessment?: No apparent visual deficits     Perception     Praxis  Pertinent Vitals/Pain Pain Assessment: No/denies pain Faces Pain Scale: No hurt     Hand Dominance Left   Extremity/Trunk Assessment Upper Extremity Assessment Upper Extremity Assessment: RUE deficits/detail;LUE deficits/detail RUE Deficits /  Details: grossly functional shoulder ROM otherwise ROM is WFL. Overall 4/5 strength. RUE Coordination: WNL;decreased fine motor LUE Deficits / Details: grossly functional shoulder ROM otherwise ROM is WFL. Overall 4/5 strength. LUE Sensation: WNL LUE Coordination: WNL   Lower Extremity Assessment Lower Extremity Assessment: Defer to PT evaluation   Cervical / Trunk Assessment Cervical / Trunk Assessment: Normal   Communication Communication Communication: HOH (mildly)   Cognition Arousal/Alertness: Awake/alert Behavior During Therapy: WFL for tasks assessed/performed Overall Cognitive Status: No family/caregiver present to determine baseline cognitive functioning                                 General Comments: History of Alzheimer's dementia, chart reports caregivers state that she was more confused and no one present to report baseline.  Pt is also out of her normal environment which tends to increase confusion as well. Patient alert to self only.   General Comments       Exercises Exercises: General Upper Extremity;General Lower Extremity General Exercises - Upper Extremity Shoulder Flexion: AROM;Both;10 reps;Seated Elbow Flexion: AROM;Both;10 reps;Seated General Exercises - Lower Extremity Ankle Circles/Pumps: AROM;Both;20 reps;Seated Long Arc Quad: AROM;Both;10 reps;Seated Hip Flexion/Marching: AROM;Both;10 reps;Seated   Shoulder Instructions      Home Living Family/patient expects to be discharged to:: Assisted living (chart says she is from [facility])                                 Additional Comments: pt is a poor historian.      Prior Functioning/Environment Level of Independence: Independent        Comments: per pt (again, questionable historian) she did not use O2 or RW before.        OT Problem List: Impaired balance (sitting and/or standing);Decreased cognition;Decreased safety awareness;Decreased knowledge of use  of DME or AE;Decreased strength      OT Treatment/Interventions: Self-care/ADL training;DME and/or AE instruction;Therapeutic activities;Balance training;Patient/family education;Therapeutic exercise    OT Goals(Current goals can be found in the care plan section) Acute Rehab OT Goals Patient Stated Goal: Pt much brighter affect today, less confused. OT Goal Formulation: Patient unable to participate in goal setting Time For Goal Achievement: 08/25/21 Potential to Achieve Goals: Good  OT Frequency: Min 2X/week   Barriers to D/C:            Co-evaluation              AM-PAC OT "6 Clicks" Daily Activity     Outcome Measure Help from another person eating meals?: A Little Help from another person taking care of personal grooming?: A Little Help from another person toileting, which includes using toliet, bedpan, or urinal?: A Lot Help from another person bathing (including washing, rinsing, drying)?: A Little Help from another person to put on and taking off regular upper body clothing?: A Little Help from another person to put on and taking off regular lower body clothing?: A Lot 6 Click Score: 16   End of Session Nurse Communication: Mobility status  Activity Tolerance: Patient tolerated treatment well Patient left: in chair;with call bell/phone within reach;with chair alarm set  OT Visit Diagnosis: Unsteadiness on feet (R26.81)  Time: 4742-5956 OT Time Calculation (min): 21 min Charges:  OT General Charges $OT Visit: 1 Visit OT Evaluation $OT Eval Low Complexity: 1 Low  Davin Muramoto, OTR/L Acute Care Rehab Services  Office [phone] Pager: [phone]   Kelli Churn 08/11/2021, 1:38 PM

## 2021-08-11 NOTE — Progress Notes (Signed)
  Echocardiogram 2D Echocardiogram has been performed.  [name] 08/11/2021, 2:58 PM

## 2021-08-11 NOTE — Progress Notes (Signed)
PROGRESS NOTE  [name] Yielding  MGQ:[id number] DOB: 03-12-1932 DOA: 08/09/2021 PCP: Lupita Raider, MD   Brief Narrative: [name] is an 87 y.o. female with a history of Alzheimer's dementia, T2DM, HTN, HLD, and recent history of UTI diagnosed in ED discharged on keflex as well as covid-19 diagnosed 9/18 who returned from [facility] ALF for weakness, shortness of breath, cough, possible altered mental status from baseline. She was noted to be febrile to 100.64F in new-onset AFib with RVR (HR 164) for which diltiazem infusion was started. SpO2 noted to be 90-92%, d-dimer 1.13, CRP 6.4, AST 58, SCr 1.17. CTA chest without infiltrate or PE, though did see some mucous plugging.   Diltiazem infusion has been weaned with bradycardic response. Heparin gtt continued. Initially placed on supplemental oxygen for comfort, this has improved.  Assessment & Plan: Active Problems:   COVID-19   Acute metabolic encephalopathy  New onset atrial fibrillation: Has converted to NSR with sinus bradycardia.  - Continues bradycardic which has resulted in holding home metoprolol. Will continue this with same holding parameters.  - Check echocardiogram  - Continue stroke risk reduction: Change heparin > eliquis  Acute hypoxic respiratory failure due to covid-19 infection: SARS-CoV-2 PCR positive on 9/18. Elevated CRP in setting of negative procalcitonin, normal WBC on admission argues for true infection. - Weaned from O2. - Received renally-dosed paxlovid beginning 9/19 with planned 5 day course. We can continue this since it is being well-tolerated and dose pack already opened. Strictly speaking, since covid is not incidental, remdesivir would typically have been recommended to start upon hospitalization. Alternatively, it is unclear whether she may have been prescribed paxlovid PTA (mild-moderate disease in high risk outpatient) and thus continued. - Continue decadron 6mg  for now with SpO2 <94% and elevated  inflammatory markers.  - Trend inflammatory markers, showing improvement.  - Encourage OOB, IS, FV, and awake proning if able - Continue airborne, contact precautions for 10 days from positive testing.  Acute metabolic encephalopathy on chronic Alzheimer's dementia:  - Continue home namenda, ?not on aricept - Delirium precautions  UTI:  - Plan to complete keflex 50mg  po BID x7 days total therapy.  Generalized weakness: Likely due to infections as above.  - PT/OT  Stage IIIb CKD: Based on current Cr values. SCr 1.17. - Avoid nephrotoxins  AST elevation: Fits with covid infection.  - Monitor.   T2DM: Diet-controlled. HbA1c 5.5%.  - No Tx indicated. Avoid hypoglycemia.   HTN:  - No changes at this time.  HLD:  - Hold statin for a few days after paxlovid Tx is completed.   DVT prophylaxis: Heparin > eliquis Code Status: Full Family Communication: None available Disposition Plan:  Status is: Inpatient  Remains inpatient appropriate because:Hemodynamically unstable, Altered mental status, and Inpatient level of care appropriate due to severity of illness  Dispo: The patient is from: ALF              Anticipated d/c is to:  TBD              Patient currently is not medically stable to d/c.   Difficult to place patient No  Consultants:  None  Procedures:  Echocardiogram ordered  Antimicrobials: Paxlovid   Subjective: Feels better, breathing more easily but hasn't been up. Oriented to place and month, not day or year. Used to work as in our ED. Does have a cough, no current chest pain or dyspnea or palpitations or fever.   Objective: Vitals:  08/10/21 2045 08/10/21 2335 08/11/21 0449 08/11/21 0857  BP: 119/65 125/72 140/81 (!) 144/66  Pulse: (!) 58 (!) 59 (!) 58 (!) 53  Resp: 15 15 15 16   Temp: 98.4 F (36.9 C) 98.2 F (36.8 C) 97.8 F (36.6 C) (!) 97.5 F (36.4 C)  TempSrc: Oral Oral Oral Oral  SpO2: 93% 95% 95% 92%  Weight:      Height:         Intake/Output Summary (Last 24 hours) at 08/11/2021 1039 Last data filed at 08/10/2021 1640 Gross per 24 hour  Intake 79.7 ml  Output 450 ml  Net -370.3 ml   Filed Weights   08/09/21 1841  Weight: 74.1 kg    Gen: Elderly female in no distress Pulm: Non-labored breathing. Clear to auscultation bilaterally.  CV: Regular rate and rhythm. No murmur, rub, or gallop. No JVD, no pitting pedal edema. GI: Abdomen soft, non-tender, non-distended, with normoactive bowel sounds. No organomegaly or masses felt. Ext: Warm, no deformities Skin: No rashes, lesions or ulcers on visualized skin Neuro: Alert and limited orientation without focal neurological deficits. Psych: Judgement and insight appear impaired. Conversant without dysarthria or tangentiality and interactive. Mood & affect appropriate.   Data Reviewed: I have personally reviewed following labs and imaging studies  CBC: Recent Labs  Lab 08/08/21 1038 08/09/21 1053 08/10/21 0300 08/11/21 0017  WBC 8.6 8.8 6.1 10.2  NEUTROABS 5.7 6.3 4.5 8.3*  HGB 13.8 14.8 14.3 14.1  HCT 43.3 46.7* 43.9 42.4  MCV 91.4 90.9 88.9 88.0  PLT 186 203 186 196   Basic Metabolic Panel: Recent Labs  Lab 08/08/21 1038 08/09/21 1053 08/10/21 0300 08/11/21 0017  NA 134* 139 135 140  K 4.0 3.8 4.1 4.3  CL 100 99 98 105  CO2 22 25 26 25   GLUCOSE 114* 138* 157* 164*  BUN 14 15 22  30*  CREATININE 1.27* 1.17* 1.02* 1.24*  CALCIUM 9.0 9.1 8.8* 9.2   GFR: Estimated Creatinine Clearance: 31.5 mL/min (A) (by C-G formula based on SCr of 1.24 mg/dL (H)). Liver Function Tests: Recent Labs  Lab 08/08/21 1038 08/09/21 1053 08/10/21 0300 08/11/21 0017  AST 23 58* 46* 42*  ALT 14 20 21 20   ALKPHOS 61 62 57 64  BILITOT 0.7 0.8 0.7 0.5  PROT 6.6 7.7 7.2 7.1  ALBUMIN 3.5 4.0 3.7 3.5   No results for input(s): LIPASE, AMYLASE in the last 168 hours. No results for input(s): AMMONIA in the last 168 hours. Coagulation Profile: Recent Labs  Lab  08/09/21 1053  INR 1.0   Cardiac Enzymes: No results for input(s): CKTOTAL, CKMB, CKMBINDEX, TROPONINI in the last 168 hours. BNP (last 3 results) No results for input(s): PROBNP in the last 8760 hours. HbA1C: Recent Labs    08/09/21 2007  HGBA1C 5.5   CBG: No results for input(s): GLUCAP in the last 168 hours. Lipid Profile: Recent Labs    08/09/21 1053  TRIG 81   Thyroid Function Tests: No results for input(s): TSH, T4TOTAL, FREET4, T3FREE, THYROIDAB in the last 72 hours. Anemia Panel: Recent Labs    08/09/21 1053 08/10/21 0300  FERRITIN 168 186   Urine analysis:    Component Value Date/Time   COLORURINE YELLOW 08/08/2021 1038   APPEARANCEUR CLEAR 08/08/2021 1038   LABSPEC 1.010 08/08/2021 1038   PHURINE 6.0 08/08/2021 1038   GLUCOSEU NEGATIVE 08/08/2021 1038   HGBUR TRACE (A) 08/08/2021 1038   BILIRUBINUR NEGATIVE 08/08/2021 1038   KETONESUR NEGATIVE 08/08/2021 1038  PROTEINUR NEGATIVE 08/08/2021 1038   UROBILINOGEN 0.2 05/13/2015 1849   NITRITE POSITIVE (A) 08/08/2021 1038   LEUKOCYTESUR SMALL (A) 08/08/2021 1038   Recent Results (from the past 240 hour(s))  Blood culture (routine x 2)     Status: None (Preliminary result)   Collection Time: 08/08/21 12:28 PM   Specimen: BLOOD  Result Value Ref Range Status   Specimen Description BLOOD RIGHT ANTECUBITAL  Final   Special Requests   Final    BOTTLES DRAWN AEROBIC AND ANAEROBIC Blood Culture adequate volume   Culture   Final    NO GROWTH 2 DAYS Performed at [facility], [address] [place], [place] [address]    Report Status PENDING  Incomplete  Blood culture (routine x 2)     Status: None (Preliminary result)   Collection Time: 08/08/21 12:28 PM   Specimen: BLOOD RIGHT WRIST  Result Value Ref Range Status   Specimen Description BLOOD RIGHT WRIST  Final   Special Requests   Final    BOTTLES DRAWN AEROBIC AND ANAEROBIC Blood Culture adequate volume   Culture   Final    NO GROWTH 2  DAYS Performed at [facility], [address], [place], [place] [address]    Report Status PENDING  Incomplete  Resp Panel by RT-PCR (Flu A&B, Covid) Urine, Clean Catch     Status: Abnormal   Collection Time: 08/08/21 12:30 PM   Specimen: Urine, Clean Catch; Nasopharyngeal(NP) swabs in vial transport medium  Result Value Ref Range Status   SARS Coronavirus 2 by RT PCR POSITIVE (A) NEGATIVE Final    Comment: RESULT CALLED TO, READ BACK BY AND VERIFIED WITH: RN E BANKS [id number] AT 1415 BY CM (NOTE) SARS-CoV-2 target nucleic acids are DETECTED.  The SARS-CoV-2 RNA is generally detectable in upper respiratory specimens during the acute phase of infection. Positive results are indicative of the presence of the identified virus, but do not rule out bacterial infection or co-infection with other pathogens not detected by the test. Clinical correlation with patient history and other diagnostic information is necessary to determine patient infection status. The expected result is Negative.  Fact Sheet for Patients: BloggerCourse.com  Fact Sheet for Healthcare Providers: SeriousBroker.it  This test is not yet approved or cleared by the [place] FDA and  has been authorized for detection and/or diagnosis of SARS-CoV-2 by FDA under an Emergency Use Authorization (EUA).  This EUA will remain in effect (meaning this test can be u sed) for the duration of  the COVID-19 declaration under Section 564(b)(1) of the Act, 21 U.S.C. section 360bbb-3(b)(1), unless the authorization is terminated or revoked sooner.     Influenza A by PCR NEGATIVE NEGATIVE Final   Influenza B by PCR NEGATIVE NEGATIVE Final    Comment: (NOTE) The Xpert Xpress SARS-CoV-2/FLU/RSV plus assay is intended as an aid in the diagnosis of influenza from Nasopharyngeal swab specimens and should not be used as a sole basis for treatment. Nasal washings and aspirates  are unacceptable for Xpert Xpress SARS-CoV-2/FLU/RSV testing.  Fact Sheet for Patients: BloggerCourse.com  Fact Sheet for Healthcare Providers: SeriousBroker.it  This test is not yet approved or cleared by the [place] FDA and has been authorized for detection and/or diagnosis of SARS-CoV-2 by FDA under an Emergency Use Authorization (EUA). This EUA will remain in effect (meaning this test can be used) for the duration of the COVID-19 declaration under Section 564(b)(1) of the Act, 21 U.S.C. section 360bbb-3(b)(1), unless the  authorization is terminated or revoked.  Performed at [facility], [address] [place], [place] [address]   MRSA Next Gen by PCR, Nasal     Status: None   Collection Time: 08/10/21  1:36 AM   Specimen: Nasal Mucosa; Nasal Swab  Result Value Ref Range Status   MRSA by PCR Next Gen NOT DETECTED NOT DETECTED Final    Comment: (NOTE) The GeneXpert MRSA Assay (FDA approved for NASAL specimens only), is one component of a comprehensive MRSA colonization surveillance program. It is not intended to diagnose MRSA infection nor to guide or monitor treatment for MRSA infections. Test performance is not FDA approved in patients less than 51 years old. Performed at [facility], [address], [place], [place] [address]       Radiology Studies: CT Angio Chest Pulmonary Embolism (PE) W or WO Contrast  Result Date: 08/09/2021 CLINICAL DATA:  PE suspected, low/intermediate prob, positive D-dimer Confusion.  Hypoxia.  Respiratory distress. EXAM: CT ANGIOGRAPHY CHEST WITH CONTRAST TECHNIQUE: Multidetector CT imaging of the chest was performed using the standard protocol during bolus administration of intravenous contrast. Multiplanar CT image reconstructions and MIPs were obtained to evaluate the vascular anatomy. CONTRAST:  32mL OMNIPAQUE IOHEXOL 350 MG/ML SOLN COMPARISON:  Radiograph  earlier today.  Remote chest CT 12/11/2011 FINDINGS: Cardiovascular: There are no filling defects within the pulmonary arteries to suggest pulmonary embolus. Aortic atherosclerosis and tortuosity. Can not assess for dissection given phase of contrast tailored to pulmonary artery evaluation. The heart is normal in size. There are coronary artery calcifications. No pericardial effusion. Mediastinum/Nodes: No enlarged mediastinal or hilar lymph nodes. No esophageal wall thickening. No visualized thyroid nodule. Lungs/Pleura: Breathing motion artifact partially obscures detailed assessment. There is mild central bronchial thickening with occasional areas of mucous plugging in the lower lobes. Right greater than left lower lobe atelectasis. No confluent airspace disease or evidence of pneumonia. Basilar assessment is particularly limited due to motion. No findings of pulmonary edema. No pleural fluid. No pulmonary mass. Upper Abdomen: No acute or unexpected findings. Musculoskeletal: Slightly exaggerated upper thoracic kyphosis. There are no acute or suspicious osseous abnormalities. Remote right posterior eleventh rib fracture with callus. Review of the MIP images confirms the above findings. IMPRESSION: 1. No pulmonary embolus. 2. Mild central bronchial thickening with occasional areas of mucous plugging in the lower lobes. Right greater than left lower lobe atelectasis. 3. Aortic atherosclerosis.  Coronary artery calcifications. Aortic Atherosclerosis (ICD10-I70.0). Electronically Signed   By: Narda Rutherford M.D.   On: 08/09/2021 23:13   DG Chest Port 1 View  Result Date: 08/09/2021 CLINICAL DATA:  COVID-19 positive. EXAM: PORTABLE CHEST 1 VIEW COMPARISON:  August 08, 2021. FINDINGS: Stable cardiomediastinal silhouette. Both lungs are clear. The visualized skeletal structures are unremarkable. IMPRESSION: No active disease. Electronically Signed   By: Lupita Raider M.D.   On: 08/09/2021 12:44    Scheduled  Meds:  vitamin C  500 mg Oral Daily   aspirin EC  81 mg Oral Daily   calcium-vitamin D  2 tablet Oral Q breakfast   cephALEXin  500 mg Oral Q8H   Chlorhexidine Gluconate Cloth  6 each Topical Daily   dexamethasone (DECADRON) injection  6 mg Intravenous Q24H   docusate sodium  100 mg Oral BID   mouth rinse  15 mL Mouth Rinse BID   melatonin  3 mg Oral QHS   memantine  10 mg Oral BID   metoprolol tartrate  25 mg  Oral BID   nirmatrelvir/ritonavir EUA (renal dosing)  2 tablet Oral BID   sodium chloride flush  3 mL Intravenous Q12H   vitamin B-12  1,000 mcg Oral BID   zinc sulfate  220 mg Oral Daily   Continuous Infusions:  sodium chloride     diltiazem (CARDIZEM) infusion Stopped (08/09/21 2027)   heparin 850 Units/hr (08/10/21 1640)     LOS: 1 day   Time spent: 35 minutes.  Tyrone Nine, MD [facility] [email or web address] 08/11/2021, 10:39 AM

## 2021-08-12 LAB — COMPREHENSIVE METABOLIC PANEL
ALT: 19 U/L (ref 0–44)
ALT: 19 U/L (ref 0–44)
AST: 30 U/L (ref 15–41)
AST: 24 U/L (ref 15–41)
Albumin: 3.5 g/dL (ref 3.5–5.0)
Albumin: 3.6 g/dL (ref 3.5–5.0)
Alkaline Phosphatase: 55 U/L (ref 38–126)
Alkaline Phosphatase: 56 U/L (ref 38–126)
Anion gap: 7 (ref 5–15)
Anion gap: 9 (ref 5–15)
BUN: 30 mg/dL — ABNORMAL HIGH (ref 8–23)
BUN: 33 mg/dL — ABNORMAL HIGH (ref 8–23)
CO2: 26 mmol/L (ref 22–32)
CO2: 27 mmol/L (ref 22–32)
Calcium: 9.1 mg/dL (ref 8.9–10.3)
Calcium: 9.1 mg/dL (ref 8.9–10.3)
Chloride: 106 mmol/L (ref 98–111)
Chloride: 105 mmol/L (ref 98–111)
Creatinine, Ser: 1.11 mg/dL — ABNORMAL HIGH (ref 0.44–1.00)
Creatinine, Ser: 1.05 mg/dL — ABNORMAL HIGH (ref 0.44–1.00)
GFR, Estimated: 48 mL/min — ABNORMAL LOW (ref >60)
GFR, Estimated: 51 mL/min — ABNORMAL LOW (ref >60)
Glucose, Bld: 155 mg/dL — ABNORMAL HIGH (ref 70–99)
Glucose, Bld: 164 mg/dL — ABNORMAL HIGH (ref 70–99)
Potassium: 4.3 mmol/L (ref 3.5–5.1)
Potassium: 4.5 mmol/L (ref 3.5–5.1)
Sodium: 139 mmol/L (ref 135–145)
Sodium: 141 mmol/L (ref 135–145)
Total Bilirubin: 0.5 mg/dL (ref 0.3–1.2)
Total Bilirubin: 0.5 mg/dL (ref 0.3–1.2)
Total Protein: 6.9 g/dL (ref 6.5–8.1)
Total Protein: 7 g/dL (ref 6.5–8.1)

## 2021-08-12 LAB — CBC WITH DIFFERENTIAL/PLATELET
Abs Immature Granulocytes: 0.05 10*3/uL (ref 0.00–0.07)
Basophils Absolute: 0 10*3/uL (ref 0.0–0.1)
Basophils Relative: 0 %
Eosinophils Absolute: 0 10*3/uL (ref 0.0–0.5)
Eosinophils Relative: 0 %
HCT: 41.3 % (ref 36.0–46.0)
Hemoglobin: 13.7 g/dL (ref 12.0–15.0)
Immature Granulocytes: 1 %
Lymphocytes Relative: 14 %
Lymphs Abs: 1.4 10*3/uL (ref 0.7–4.0)
MCH: 28.9 pg (ref 26.0–34.0)
MCHC: 33.2 g/dL (ref 30.0–36.0)
MCV: 87.1 fL (ref 80.0–100.0)
Monocytes Absolute: 0.4 10*3/uL (ref 0.1–1.0)
Monocytes Relative: 4 %
Neutro Abs: 8.2 10*3/uL — ABNORMAL HIGH (ref 1.7–7.7)
Neutrophils Relative %: 81 %
Platelets: 230 10*3/uL (ref 150–400)
RBC: 4.74 MIL/uL (ref 3.87–5.11)
RDW: 13.2 % (ref 11.5–15.5)
WBC: 10.1 10*3/uL (ref 4.0–10.5)
nRBC: 0 % (ref 0.0–0.2)

## 2021-08-12 LAB — HEPARIN LEVEL (UNFRACTIONATED)
Heparin Unfractionated: 0.87 IU/mL — ABNORMAL HIGH (ref 0.30–0.70)
Heparin Unfractionated: 0.72 IU/mL — ABNORMAL HIGH (ref 0.30–0.70)
Heparin Unfractionated: 0.46 IU/mL (ref 0.30–0.70)

## 2021-08-12 LAB — C-REACTIVE PROTEIN: CRP: 0.9 mg/dL (ref <1.0)

## 2021-08-12 MED ORDER — HEPARIN (PORCINE) 25000 UT/250ML-% IV SOLN
600.0000 [IU]/h | INTRAVENOUS | Status: DC
  Administered 2021-08-13: 600 [IU]/h via INTRAVENOUS
  Filled 2021-08-12 (×2): qty 250

## 2021-08-12 MED ORDER — METOPROLOL TARTRATE 25 MG PO TABS
12.5000 mg | ORAL_TABLET | Freq: Two times a day (BID) | ORAL | Status: DC
  Administered 2021-08-14 – 2021-08-16 (×2): 12.5 mg via ORAL
  Filled 2021-08-12 (×5): qty 1

## 2021-08-12 NOTE — Progress Notes (Signed)
ANTICOAGULATION CONSULT NOTE - follow up  Pharmacy Consult for IV heparin Indication: atrial fibrillation  Allergies  Allergen Reactions   Sulfa Antibiotics Hives, Diarrhea, Nausea And Vomiting and Swelling    Facial swelling ALSO   Latex Rash    Patient Measurements: Height: 5\' 4"  (162.6 cm) Weight: 74.1 kg (163 lb 5.8 oz) IBW/kg (Calculated) : 54.7 Heparin Dosing Weight: 70.1 kg  Vital Signs: Temp: 98 F (36.7 C) (09/22 0907) Temp Source: Oral (09/22 0907) BP: 155/65 (09/22 0907) Pulse Rate: 52 (09/22 0907)  Labs: Recent Labs    08/10/21 0300 08/10/21 0640 08/11/21 0017 08/11/21 0849 08/12/21 0404 08/12/21 1310  HGB 14.3  --  14.1  --  13.7  --   HCT 43.9  --  42.4  --  41.3  --   PLT 186  --  196  --  230  --   HEPARINUNFRC  --    < >  --  0.67 0.87* 0.72*  CREATININE 1.02*  --  1.24*  --  1.11*  --    < > = values in this interval not displayed.    Estimated Creatinine Clearance: 35.2 mL/min (A) (by C-G formula based on SCr of 1.11 mg/dL (H)).  Medical History: Past Medical History:  Diagnosis Date   Allergies    Alzheimer disease (HCC)    STABLE   Anemia, iron deficiency 12/12/2011   Arthritis    RF   CKD (chronic kidney disease)    STAGE 3   Diabetes mellitus    no mes, diet only    Diarrhea    RESOLVED   Hypercholesterolemia    Hyperlipemia    Hypertension    Major depression    OA (osteoarthritis)    HAND/LEFT KNEE PAIN MILD   Osteoporosis    Syncope      Assessment: 87 yo female presenting with cough and shortness of breath, COVID positive.  Found to have been in Afib with RVR while in emergency room - new diagnosis.  No anticoagulation noted PTA. D-dimer elevated at 1.13 and fibrinogen also elevated at 521. Hemoglobin and platelets stable. Baseline INR 1, aPTT 28 seconds. Pharmacy consulted for IV heparin dosing.   Today, 08/12/21:  1310 heparin level = 0.72 units/mL, remains slightly supratherapeutic despite decrease in rate earlier  this morning  CBC WNL No bleeding or infusion issues noted per nursing  Goal of Therapy:  Heparin level 0.3-0.7 units/ml Monitor platelets by anticoagulation protocol: Yes    Plan:  Decrease heparin infusion to 600 units/hr Heparin level 8 hours after rate change Daily CBC, heparin level Monitor closely for s/sx of bleeding Follow up long-term anticoagulation plans   08/14/21, PharmD, BCPS Clinical Pharmacist  08/12/2021, 2:09 PM

## 2021-08-12 NOTE — Progress Notes (Signed)
PROGRESS NOTE  [name]  FYB:[id number] DOB: 1932-10-26 DOA: 08/09/2021 PCP: Lupita Raider, MD   Brief Narrative: [name] is an 87 y.o. female with a history of Alzheimer's dementia, T2DM, HTN, HLD, and recent history of UTI diagnosed in ED discharged on keflex as well as covid-19 diagnosed 9/18 who returned from [facility] ALF for weakness, shortness of breath, cough, possible altered mental status from baseline. She was noted to be febrile to 100.67F in new-onset AFib with RVR (HR 164) for which diltiazem infusion was started. SpO2 noted to be 90-92%, d-dimer 1.13, CRP 6.4, AST 58, SCr 1.17. CTA chest without infiltrate or PE, though did see some mucous plugging.   Diltiazem infusion has been weaned with bradycardic response. Heparin gtt continued. Initially placed on supplemental oxygen for comfort, this has improved.  Assessment & Plan: Active Problems:   COVID-19   Acute metabolic encephalopathy  New onset atrial fibrillation: Has converted to NSR with sinus bradycardia. Echo without significant abnormalities, preserved LVEF, no WMA, unremarkable valves. - Continues bradycardic which has resulted in holding home metoprolol. Will decrease dose to 12.5mg  and continue to hold if HR <60bpm. Telemetry (personal review) shows sinus bradycardia without sinus pauses. - Continue stroke risk reduction: Change heparin > eliquis once off paxlovid.  - TSH pending (has historically been normal with multiple checks as early as Jan 2013)  Acute hypoxic respiratory failure due to covid-19 infection: SARS-CoV-2 PCR positive on 9/18. Elevated CRP in setting of negative procalcitonin, normal WBC on admission argues for true infection. - Weaned from O2. - Received renally-dosed paxlovid beginning 9/19 with planned 5 day course. We can continue this since it is being well-tolerated and dose pack already opened. Strictly speaking, since covid is not incidental, remdesivir would typically have  been recommended to start upon hospitalization. Alternatively, it is unclear whether she may have been prescribed paxlovid PTA (mild-moderate disease in high risk outpatient) and thus continued. - Stop decadron with rapid weaning from O2 and normalization of CRP.   - Encourage OOB, IS, FV, and awake proning if able - Continue airborne, contact precautions for 10 days from positive testing.  Acute metabolic encephalopathy on chronic Alzheimer's dementia:  - Continue home namenda, ?not on aricept - Delirium precautions  UTI:  - Plan to complete keflex 50mg  po BID x7 days total therapy.  Generalized weakness: Likely due to infections as above.  - PT/OT  Stage IIIb CKD: Based on current Cr values. SCr 1.17. - Avoid nephrotoxins  AST elevation: Fits with covid infection. Resolved.  T2DM: Diet-controlled. HbA1c 5.5%.  - No Tx indicated. Avoid hypoglycemia.   HTN:  - No changes at this time.  HLD:  - Hold statin for a few days after paxlovid Tx is completed.   DVT prophylaxis: Heparin gtt Code Status: Full Family Communication: None available Disposition Plan:  Status is: Inpatient  Remains inpatient appropriate because:Hemodynamically unstable, Altered mental status, and Inpatient level of care appropriate due to severity of illness  Dispo: The patient is from: ALF              Anticipated d/c is to:  TBD  Likely return to ALF w/HH and supervision for OOB mobility.              Patient currently is not medically stable to d/c.   Difficult to place patient No  Consultants:  None  Procedures:  Echocardiogram 08/11/2021: Limited acoustic windows. LVEF 60-65%, G1DD, no WMA, RV normal, IVC normal, valves unremarkable.  Antimicrobials: Paxlovid   Subjective: No dyspnea or pain, no palpitations, no lightheadedness or syncope on standing. Worked with therapy yesterday, doesn't recall that.   Objective: Vitals:   08/11/21 1422 08/11/21 2020 08/12/21 0631 08/12/21 0907  BP:  (!) 146/57 (!) 159/73 (!) 157/67 (!) 155/65  Pulse: (!) 48 (!) 53 (!) 48 (!) 52  Resp: 16 18 16 20   Temp: 97.7 F (36.5 C) 98.4 F (36.9 C) 98.1 F (36.7 C) 98 F (36.7 C)  TempSrc: Oral Oral Oral Oral  SpO2: 95% 94% 93% 94%  Weight:      Height:        Intake/Output Summary (Last 24 hours) at 08/12/2021 1045 Last data filed at 08/12/2021 0100 Gross per 24 hour  Intake 360 ml  Output 1150 ml  Net -790 ml   Filed Weights   08/09/21 1841  Weight: 74.1 kg   Gen: Pleasant elderly female in no distress Pulm: Nonlabored breathing room air. Clear. CV: Regular bradycardia without murmur, rub, or gallop. No JVD, no dependent edema. GI: Abdomen soft, non-tender, non-distended, with normoactive bowel sounds.  Ext: Warm, no deformities Skin: No new rashes, lesions or ulcers on visualized skin. Neuro: Alert and interactive, conversant with impaired memory. No focal neurological deficits. Psych: Judgement and insight appear impaired by limited recall. Mood euthymic & affect congruent. Behavior is appropriate.    Data Reviewed: I have personally reviewed following labs and imaging studies  CBC: Recent Labs  Lab 08/08/21 1038 08/09/21 1053 08/10/21 0300 08/11/21 0017 08/12/21 0404  WBC 8.6 8.8 6.1 10.2 10.1  NEUTROABS 5.7 6.3 4.5 8.3* 8.2*  HGB 13.8 14.8 14.3 14.1 13.7  HCT 43.3 46.7* 43.9 42.4 41.3  MCV 91.4 90.9 88.9 88.0 87.1  PLT 186 203 186 196 230   Basic Metabolic Panel: Recent Labs  Lab 08/08/21 1038 08/09/21 1053 08/10/21 0300 08/11/21 0017 08/12/21 0404  NA 134* 139 135 140 139  K 4.0 3.8 4.1 4.3 4.3  CL 100 99 98 105 106  CO2 22 25 26 25 26   GLUCOSE 114* 138* 157* 164* 155*  BUN 14 15 22  30* 30*  CREATININE 1.27* 1.17* 1.02* 1.24* 1.11*  CALCIUM 9.0 9.1 8.8* 9.2 9.1   GFR: Estimated Creatinine Clearance: 35.2 mL/min (A) (by C-G formula based on SCr of 1.11 mg/dL (H)). Liver Function Tests: Recent Labs  Lab 08/08/21 1038 08/09/21 1053 08/10/21 0300  08/11/21 0017 08/12/21 0404  AST 23 58* 46* 42* 30  ALT 14 20 21 20 19   ALKPHOS 61 62 57 64 55  BILITOT 0.7 0.8 0.7 0.5 0.5  PROT 6.6 7.7 7.2 7.1 6.9  ALBUMIN 3.5 4.0 3.7 3.5 3.5   No results for input(s): LIPASE, AMYLASE in the last 168 hours. No results for input(s): AMMONIA in the last 168 hours. Coagulation Profile: Recent Labs  Lab 08/09/21 1053  INR 1.0   Cardiac Enzymes: No results for input(s): CKTOTAL, CKMB, CKMBINDEX, TROPONINI in the last 168 hours. BNP (last 3 results) No results for input(s): PROBNP in the last 8760 hours. HbA1C: Recent Labs    08/09/21 2007  HGBA1C 5.5   CBG: No results for input(s): GLUCAP in the last 168 hours. Lipid Profile: Recent Labs    08/09/21 1053  TRIG 81   Thyroid Function Tests: No results for input(s): TSH, T4TOTAL, FREET4, T3FREE, THYROIDAB in the last 72 hours. Anemia Panel: Recent Labs    08/09/21 1053 08/10/21 0300  FERRITIN 168 186   Urine analysis:  Component Value Date/Time   COLORURINE YELLOW 08/08/2021 1038   APPEARANCEUR CLEAR 08/08/2021 1038   LABSPEC 1.010 08/08/2021 1038   PHURINE 6.0 08/08/2021 1038   GLUCOSEU NEGATIVE 08/08/2021 1038   HGBUR TRACE (A) 08/08/2021 1038   BILIRUBINUR NEGATIVE 08/08/2021 1038   KETONESUR NEGATIVE 08/08/2021 1038   PROTEINUR NEGATIVE 08/08/2021 1038   UROBILINOGEN 0.2 05/13/2015 1849   NITRITE POSITIVE (A) 08/08/2021 1038   LEUKOCYTESUR SMALL (A) 08/08/2021 1038   Recent Results (from the past 240 hour(s))  Blood culture (routine x 2)     Status: None (Preliminary result)   Collection Time: 08/08/21 12:28 PM   Specimen: BLOOD  Result Value Ref Range Status   Specimen Description BLOOD RIGHT ANTECUBITAL  Final   Special Requests   Final    BOTTLES DRAWN AEROBIC AND ANAEROBIC Blood Culture adequate volume   Culture   Final    NO GROWTH 3 DAYS Performed at [facility], [address], [place], [place] [address]    Report Status PENDING  Incomplete   Blood culture (routine x 2)     Status: None (Preliminary result)   Collection Time: 08/08/21 12:28 PM   Specimen: BLOOD RIGHT WRIST  Result Value Ref Range Status   Specimen Description BLOOD RIGHT WRIST  Final   Special Requests   Final    BOTTLES DRAWN AEROBIC AND ANAEROBIC Blood Culture adequate volume   Culture   Final    NO GROWTH 3 DAYS Performed at [facility], [address] [place], [place] [address]    Report Status PENDING  Incomplete  Resp Panel by RT-PCR (Flu A&B, Covid) Urine, Clean Catch     Status: Abnormal   Collection Time: 08/08/21 12:30 PM   Specimen: Urine, Clean Catch; Nasopharyngeal(NP) swabs in vial transport medium  Result Value Ref Range Status   SARS Coronavirus 2 by RT PCR POSITIVE (A) NEGATIVE Final    Comment: RESULT CALLED TO, READ BACK BY AND VERIFIED WITH: RN E BANKS [id number] AT 1415 BY CM (NOTE) SARS-CoV-2 target nucleic acids are DETECTED.  The SARS-CoV-2 RNA is generally detectable in upper respiratory specimens during the acute phase of infection. Positive results are indicative of the presence of the identified virus, but do not rule out bacterial infection or co-infection with other pathogens not detected by the test. Clinical correlation with patient history and other diagnostic information is necessary to determine patient infection status. The expected result is Negative.  Fact Sheet for Patients: BloggerCourse.com  Fact Sheet for Healthcare Providers: SeriousBroker.it  This test is not yet approved or cleared by the [place] FDA and  has been authorized for detection and/or diagnosis of SARS-CoV-2 by FDA under an Emergency Use Authorization (EUA).  This EUA will remain in effect (meaning this test can be u sed) for the duration of  the COVID-19 declaration under Section 564(b)(1) of the Act, 21 U.S.C. section 360bbb-3(b)(1), unless the authorization is terminated or  revoked sooner.     Influenza A by PCR NEGATIVE NEGATIVE Final   Influenza B by PCR NEGATIVE NEGATIVE Final    Comment: (NOTE) The Xpert Xpress SARS-CoV-2/FLU/RSV plus assay is intended as an aid in the diagnosis of influenza from Nasopharyngeal swab specimens and should not be used as a sole basis for treatment. Nasal washings and aspirates are unacceptable for Xpert Xpress SARS-CoV-2/FLU/RSV testing.  Fact Sheet for Patients: BloggerCourse.com  Fact Sheet for Healthcare Providers: SeriousBroker.it  This test is not yet approved or cleared  by the [place] and has been authorized for detection and/or diagnosis of SARS-CoV-2 by FDA under an Emergency Use Authorization (EUA). This EUA will remain in effect (meaning this test can be used) for the duration of the COVID-19 declaration under Section 564(b)(1) of the Act, 21 U.S.C. section 360bbb-3(b)(1), unless the authorization is terminated or revoked.  Performed at [facility], [address], [place], [place] [address]   MRSA Next Gen by PCR, Nasal     Status: None   Collection Time: 08/10/21  1:36 AM   Specimen: Nasal Mucosa; Nasal Swab  Result Value Ref Range Status   MRSA by PCR Next Gen NOT DETECTED NOT DETECTED Final    Comment: (NOTE) The GeneXpert MRSA Assay (FDA approved for NASAL specimens only), is one component of a comprehensive MRSA colonization surveillance program. It is not intended to diagnose MRSA infection nor to guide or monitor treatment for MRSA infections. Test performance is not FDA approved in patients less than 73 years old. Performed at [facility], [address], [place], [place] [address]       Radiology Studies: ECHOCARDIOGRAM COMPLETE  Result Date: 08/11/2021    ECHOCARDIOGRAM REPORT   Patient Name:   [name] Date of Exam: 08/11/2021 Medical Rec #:  [id number]       Height:       64.0 in Accession #:     [id number]      Weight:       163.4 lb Date of Birth:  October 07, 1932       BSA:          1.795 m Patient Age:    89 years        BP:           144/66 mmHg Patient Gender: F               HR:           53 bpm. Exam Location:  Inpatient Procedure: 2D Echo, Cardiac Doppler and Color Doppler Indications:    Atrial fibrillation  History:        Patient has prior history of Echocardiogram examinations, most                 recent 01/24/2019. Arrythmias:Atrial Fibrillation,                 Signs/Symptoms:Dementia, CKD; Risk Factors:Diabetes,                 Hypertension, Dyslipidemia and Obesity. Covid+.  Sonographer:    Lavenia Atlas RDCS Referring Phys: [phone] Tyrone Nine  Sonographer Comments: Technically difficult study due to poor echo windows. IMPRESSIONS  1. Left ventricular ejection fraction, by estimation, is 60 to 65%. The left ventricle has normal function. The left ventricle has no regional wall motion abnormalities. Left ventricular diastolic parameters are consistent with Grade I diastolic dysfunction (impaired relaxation).  2. Right ventricular systolic function is normal. The right ventricular size is normal. Tricuspid regurgitation signal is inadequate for assessing PA pressure.  3. The mitral valve is grossly normal. Trivial mitral valve regurgitation. No evidence of mitral stenosis.  4. The aortic valve is grossly normal. There is mild calcification of the aortic valve. Aortic valve regurgitation is not visualized. No aortic stenosis is present.  5. The inferior vena cava is normal in size with greater than 50% respiratory variability, suggesting right atrial pressure of 3 mmHg. FINDINGS  Left Ventricle: Left ventricular ejection  fraction, by estimation, is 60 to 65%. The left ventricle has normal function. The left ventricle has no regional wall motion abnormalities. The left ventricular internal cavity size was normal in size. There is  no left ventricular hypertrophy. Left ventricular diastolic  parameters are consistent with Grade I diastolic dysfunction (impaired relaxation). Right Ventricle: The right ventricular size is normal. No increase in right ventricular wall thickness. Right ventricular systolic function is normal. Tricuspid regurgitation signal is inadequate for assessing PA pressure. Left Atrium: Left atrial size was normal in size. Right Atrium: Right atrial size was normal in size. Pericardium: There is no evidence of pericardial effusion. Presence of pericardial fat pad. Mitral Valve: The mitral valve is grossly normal. Trivial mitral valve regurgitation. No evidence of mitral valve stenosis. Tricuspid Valve: The tricuspid valve is normal in structure. Tricuspid valve regurgitation is trivial. No evidence of tricuspid stenosis. Aortic Valve: The aortic valve is grossly normal. There is mild calcification of the aortic valve. Aortic valve regurgitation is not visualized. No aortic stenosis is present. Pulmonic Valve: The pulmonic valve was not well visualized. Pulmonic valve regurgitation is not visualized. No evidence of pulmonic stenosis. Aorta: The aortic root is normal in size and structure. Venous: The inferior vena cava is normal in size with greater than 50% respiratory variability, suggesting right atrial pressure of 3 mmHg. IAS/Shunts: No atrial level shunt detected by color flow Doppler.  LEFT VENTRICLE PLAX 2D LVIDd:         4.30 cm     Diastology LVIDs:         2.80 cm     LV e' medial:    6.85 cm/s LV PW:         0.80 cm     LV E/e' medial:  9.4 LV IVS:        0.80 cm     LV e' lateral:   5.44 cm/s LVOT diam:     2.00 cm     LV E/e' lateral: 11.8 LV SV:         83 LV SV Index:   46 LVOT Area:     3.14 cm  LV Volumes (MOD) LV vol d, MOD A4C: 51.5 ml LV vol s, MOD A4C: 15.9 ml LV SV MOD A4C:     51.5 ml RIGHT VENTRICLE RV Basal diam:  2.20 cm RV S prime:     11.20 cm/s TAPSE (M-mode): 2.7 cm LEFT ATRIUM             Index       RIGHT ATRIUM           Index LA diam:        2.90 cm  1.62 cm/m  RA Area:     10.40 cm LA Vol (A2C):   42.3 ml 23.56 ml/m RA Volume:   20.90 ml  11.64 ml/m LA Vol (A4C):   20.6 ml 11.48 ml/m LA Biplane Vol: 29.8 ml 16.60 ml/m  AORTIC VALVE LVOT Vmax:   92.00 cm/s LVOT Vmean:  64.400 cm/s LVOT VTI:    0.264 m  AORTA Ao Root diam: 2.70 cm MITRAL VALVE MV Area (PHT): 2.16 cm    SHUNTS MV Decel Time: 351 msec    Systemic VTI:  0.26 m MV E velocity: 64.30 cm/s  Systemic Diam: 2.00 cm MV A velocity: 92.10 cm/s MV E/A ratio:  0.70 Weston Brass MD Electronically signed by Weston Brass MD Signature Date/Time: 08/11/2021/6:10:29 PM    Final  Scheduled Meds:  vitamin C  500 mg Oral Daily   calcium-vitamin D  2 tablet Oral Q breakfast   cephALEXin  500 mg Oral BID   Chlorhexidine Gluconate Cloth  6 each Topical Daily   dexamethasone  6 mg Oral Daily   docusate sodium  100 mg Oral BID   mouth rinse  15 mL Mouth Rinse BID   melatonin  3 mg Oral QHS   memantine  10 mg Oral BID   metoprolol tartrate  25 mg Oral BID   nirmatrelvir/ritonavir EUA (renal dosing)  2 tablet Oral BID   sertraline  100 mg Oral QHS   sodium chloride flush  3 mL Intravenous Q12H   vitamin B-12  1,000 mcg Oral BID   zinc sulfate  220 mg Oral Daily   Continuous Infusions:  sodium chloride     heparin 700 Units/hr (08/12/21 0540)     LOS: 2 days   Time spent: 35 minutes.  Tyrone Nine, MD [facility] [email or web address] 08/12/2021, 10:45 AM

## 2021-08-12 NOTE — Progress Notes (Signed)
ANTICOAGULATION CONSULT NOTE - follow up  Pharmacy Consult for IV heparin Indication: atrial fibrillation  Allergies  Allergen Reactions   Sulfa Antibiotics Hives, Diarrhea, Nausea And Vomiting and Swelling    Facial swelling ALSO   Latex Rash    Patient Measurements: Height: 5\' 4"  (162.6 cm) Weight: 74.1 kg (163 lb 5.8 oz) IBW/kg (Calculated) : 54.7 Heparin Dosing Weight: 70.1 kg  Vital Signs: Temp: 98.4 F (36.9 C) (09/22 2223) Temp Source: Oral (09/22 2223) BP: 140/50 (09/22 2223) Pulse Rate: 65 (09/22 2223)  Labs: Recent Labs    08/10/21 0300 08/10/21 0640 08/11/21 0017 08/11/21 0849 08/12/21 0404 08/12/21 1310 08/12/21 2240  HGB 14.3  --  14.1  --  13.7  --   --   HCT 43.9  --  42.4  --  41.3  --   --   PLT 186  --  196  --  230  --   --   HEPARINUNFRC  --    < >  --    < > 0.87* 0.72* 0.46  CREATININE 1.02*  --  1.24*  --  1.11*  --   --    < > = values in this interval not displayed.    Estimated Creatinine Clearance: 35.2 mL/min (A) (by C-G formula based on SCr of 1.11 mg/dL (H)).  Medical History: Past Medical History:  Diagnosis Date   Allergies    Alzheimer disease (HCC)    STABLE   Anemia, iron deficiency 12/12/2011   Arthritis    RF   CKD (chronic kidney disease)    STAGE 3   Diabetes mellitus    no mes, diet only    Diarrhea    RESOLVED   Hypercholesterolemia    Hyperlipemia    Hypertension    Major depression    OA (osteoarthritis)    HAND/LEFT KNEE PAIN MILD   Osteoporosis    Syncope      Assessment: 87 yo female presenting with cough and shortness of breath, COVID positive.  Found to have been in Afib with RVR while in emergency room - new diagnosis.  No anticoagulation noted PTA. D-dimer elevated at 1.13 and fibrinogen also elevated at 521. Hemoglobin and platelets stable. Baseline INR 1, aPTT 28 seconds. Pharmacy consulted for IV heparin dosing.   Today, 08/12/21:  HL 0.46 therapeutic on 600 units/hr No bleeding or infusion  issues noted   Goal of Therapy:  Heparin level 0.3-0.7 units/ml Monitor platelets by anticoagulation protocol: Yes    Plan:  Continue heparin infusion at 600 units/hr Confirmatory heparin level in 8 hours Daily CBC, heparin level Monitor closely for s/sx of bleeding Follow up long-term anticoagulation plans   08/14/21 RPh 08/12/2021, 11:03 PM

## 2021-08-12 NOTE — Progress Notes (Signed)
ANTICOAGULATION CONSULT NOTE - follow up  Pharmacy Consult for IV heparin Indication: atrial fibrillation  Allergies  Allergen Reactions   Sulfa Antibiotics Hives, Diarrhea, Nausea And Vomiting and Swelling    Facial swelling ALSO   Latex Rash    Patient Measurements: Height: 5\' 4"  (162.6 cm) Weight: 74.1 kg (163 lb 5.8 oz) IBW/kg (Calculated) : 54.7 Heparin Dosing Weight: 70.1 kg  Vital Signs: Temp: 98.4 F (36.9 C) (09/21 2020) Temp Source: Oral (09/21 2020) BP: 159/73 (09/21 2020) Pulse Rate: 53 (09/21 2020)  Labs: Recent Labs    08/09/21 1053 08/09/21 2233 08/10/21 0300 08/10/21 0640 08/10/21 2339 08/11/21 0017 08/11/21 0849 08/12/21 0404  HGB 14.8  --  14.3  --   --  14.1  --  13.7  HCT 46.7*  --  43.9  --   --  42.4  --  41.3  PLT 203  --  186  --   --  196  --  230  APTT 28  --   --   --   --   --   --   --   LABPROT 13.1  --   --   --   --   --   --   --   INR 1.0  --   --   --   --   --   --   --   HEPARINUNFRC  --    < >  --    < > 0.51  --  0.67 0.87*  CREATININE 1.17*  --  1.02*  --   --  1.24*  --  1.11*   < > = values in this interval not displayed.    Estimated Creatinine Clearance: 35.2 mL/min (A) (by C-G formula based on SCr of 1.11 mg/dL (H)).  Medical History: Past Medical History:  Diagnosis Date   Allergies    Alzheimer disease (HCC)    STABLE   Anemia, iron deficiency 12/12/2011   Arthritis    RF   CKD (chronic kidney disease)    STAGE 3   Diabetes mellitus    no mes, diet only    Diarrhea    RESOLVED   Hypercholesterolemia    Hyperlipemia    Hypertension    Major depression    OA (osteoarthritis)    HAND/LEFT KNEE PAIN MILD   Osteoporosis    Syncope      Assessment: 87 yo female presenting with cough and shortness of breath, COVID positive.  Found to have been in Afib with RVR while in emergency room - new diagnosis.  No anticoagulation noted PTA. D-dimer elevated at 1.13 and fibrinogen also elevated at 521. Hemoglobin  and platelets stable. Baseline INR 1, aPTT 28 seconds. Pharmacy consulted for IV heparin dosing.   Today, 08/12/21:  HL 0.87 supra-therapeutic on 850 units/hr CBC WNL No bleeding or infusion issues noted per nursing  Goal of Therapy:  Heparin level 0.3-0.7 units/ml Monitor platelets by anticoagulation protocol: Yes    Plan:  Decrease heparin infusion to 700 units/hr Heparin level in 8 hours Daily CBC, heparin level Monitor closely for s/sx of bleeding Await decision for possible oral anti-coag   08/14/21 RPh 08/12/2021, 4:46 AM

## 2021-08-13 LAB — CBC
HCT: 42.2 % (ref 36.0–46.0)
Hemoglobin: 13.8 g/dL (ref 12.0–15.0)
MCH: 28.7 pg (ref 26.0–34.0)
MCHC: 32.7 g/dL (ref 30.0–36.0)
MCV: 87.7 fL (ref 80.0–100.0)
Platelets: 213 10*3/uL (ref 150–400)
RBC: 4.81 MIL/uL (ref 3.87–5.11)
RDW: 13 % (ref 11.5–15.5)
WBC: 12.2 10*3/uL — ABNORMAL HIGH (ref 4.0–10.5)
nRBC: 0 % (ref 0.0–0.2)

## 2021-08-13 LAB — BASIC METABOLIC PANEL
Anion gap: 9 (ref 5–15)
BUN: 32 mg/dL — ABNORMAL HIGH (ref 8–23)
CO2: 25 mmol/L (ref 22–32)
Calcium: 9.5 mg/dL (ref 8.9–10.3)
Chloride: 106 mmol/L (ref 98–111)
Creatinine, Ser: 1.05 mg/dL — ABNORMAL HIGH (ref 0.44–1.00)
GFR, Estimated: 51 mL/min — ABNORMAL LOW (ref >60)
Glucose, Bld: 153 mg/dL — ABNORMAL HIGH (ref 70–99)
Potassium: 4.6 mmol/L (ref 3.5–5.1)
Sodium: 140 mmol/L (ref 135–145)

## 2021-08-13 LAB — TSH: TSH: 1.069 u[IU]/mL (ref 0.350–4.500)

## 2021-08-13 LAB — HEPARIN LEVEL (UNFRACTIONATED)
Heparin Unfractionated: 0.33 IU/mL (ref 0.30–0.70)
Heparin Unfractionated: 0.43 IU/mL (ref 0.30–0.70)

## 2021-08-13 MED ORDER — HEPARIN (PORCINE) 25000 UT/250ML-% IV SOLN
650.0000 [IU]/h | INTRAVENOUS | Status: DC
  Administered 2021-08-14: 650 [IU]/h via INTRAVENOUS
  Filled 2021-08-13 (×3): qty 250

## 2021-08-13 NOTE — Progress Notes (Signed)
ANTICOAGULATION CONSULT NOTE - follow up  Pharmacy Consult for IV heparin Indication: atrial fibrillation  Allergies  Allergen Reactions   Sulfa Antibiotics Hives, Diarrhea, Nausea And Vomiting and Swelling    Facial swelling ALSO   Latex Rash    Patient Measurements: Height: 5\' 4"  (162.6 cm) Weight: 74.1 kg (163 lb 5.8 oz) IBW/kg (Calculated) : 54.7 Heparin Dosing Weight: 70 kg  Vital Signs: Temp: 97.7 F (36.5 C) (09/23 0859) Temp Source: Oral (09/23 0859) BP: 188/74 (09/23 0917) Pulse Rate: 53 (09/23 0917)  Labs: Recent Labs    08/11/21 0017 08/11/21 0849 08/12/21 0404 08/12/21 1310 08/12/21 2240 08/12/21 2307 08/13/21 0706  HGB 14.1  --  13.7  --   --   --  13.8  HCT 42.4  --  41.3  --   --   --  42.2  PLT 196  --  230  --   --   --  213  HEPARINUNFRC  --    < > 0.87* 0.72* 0.46  --  0.33  CREATININE 1.24*  --  1.11*  --   --  1.05* 1.05*   < > = values in this interval not displayed.    Estimated Creatinine Clearance: 37.2 mL/min (A) (by C-G formula based on SCr of 1.05 mg/dL (H)).  Medical History: Past Medical History:  Diagnosis Date   Allergies    Alzheimer disease (HCC)    STABLE   Anemia, iron deficiency 12/12/2011   Arthritis    RF   CKD (chronic kidney disease)    STAGE 3   Diabetes mellitus    no mes, diet only    Diarrhea    RESOLVED   Hypercholesterolemia    Hyperlipemia    Hypertension    Major depression    OA (osteoarthritis)    HAND/LEFT KNEE PAIN MILD   Osteoporosis    Syncope      Assessment: 87 yo female presenting with cough and shortness of breath, COVID positive. Found to have been in Afib with RVR while in emergency room - new diagnosis.  No anticoagulation noted PTA. Pharmacy consulted for IV heparin dosing.   Today, 08/13/21:  HL = 0.33 is therapeutic on heparin infusion of 600 units/hr, however has drifted down to lower end of therapeutic range Confirmed with RN that heparin infusing at correct rate. No  interruptions in infusion. No signs of bleeding.  CBC: Stable, WNL  Goal of Therapy:  Heparin level 0.3-0.7 units/ml Monitor platelets by anticoagulation protocol: Yes  Plan:  Slightly increase heparin infusion to 650 units/hr since on lower end of therapeutic range Check 8 hour HL Daily CBC, HL while on heparin infusion.  Monitor for signs of bleeding Noted plan to transition to apixaban after Paxlovid completed. Last dose of Paxlovid on 9/22 @ 1008  Per IDSA recommendation, apixaban 5 mg or 10 mg should be dose reduced by 50% until 3 days after Paxlovid  10/22, PharmD 08/13/21 9:50 AM

## 2021-08-13 NOTE — Progress Notes (Signed)
PROGRESS NOTE  Noraa Pickeral Pelster  MPN:[id number] DOB: 1932-06-01 DOA: 08/09/2021 PCP: Lupita Raider, MD   Brief Narrative: [name] is an 87 y.o. female with a history of Alzheimer's dementia, T2DM, HTN, HLD, and recent history of UTI diagnosed in ED discharged on keflex as well as covid-19 diagnosed 9/18 who returned from [facility] ALF for weakness, shortness of breath, cough, possible altered mental status from baseline. She was noted to be febrile to 100.79F in new-onset AFib with RVR (HR 164) for which diltiazem infusion was started. SpO2 noted to be 90-92%, d-dimer 1.13, CRP 6.4, AST 58, SCr 1.17. CTA chest without infiltrate or PE, though did see some mucous plugging.   Diltiazem infusion has been weaned with bradycardic response. Heparin gtt continued. Initially placed on supplemental oxygen for comfort, this has improved.  Assessment & Plan: Active Problems:   COVID-19   Acute metabolic encephalopathy  New onset atrial fibrillation: Has converted to NSR with sinus bradycardia. Echo without significant abnormalities, preserved LVEF, no WMA, unremarkable valves. TSH 1.069.  - Continues bradycardic which has resulted in holding home metoprolol. Will decrease dose to 12.5mg  and continue to hold if HR <60bpm. - Continue stroke risk reduction: Change heparin > eliquis once off paxlovid. She is unable to give herself lovenox injections and would not have assistance to do that at ALF.  Acute hypoxic respiratory failure due to covid-19 infection: SARS-CoV-2 PCR positive on 9/18. Elevated CRP in setting of negative procalcitonin, normal WBC on admission argues for true infection. - Weaned from O2. - Received renally-dosed paxlovid beginning 9/19 with planned 5 day course, stopped due to significant and sustained clinical improvement and need to initiate DOAC. Will allow bleed out of paxlovid over the weekend and initiation of eliquis 9/25.  - Stopped decadron with rapid weaning from O2  and normalization of CRP.   - Encourage OOB, IS, FV, and awake proning if able - Continue airborne, contact precautions for 10 days from positive testing.  Acute metabolic encephalopathy on chronic Alzheimer's dementia: Has remained stable since shortly after admission, so acute encephalopathy seems to have resolved. - Continue home namenda, ?not on aricept - Delirium precautions  UTI:  - Plan to complete keflex 50mg  po BID x7 days total therapy.  Generalized weakness: Likely due to infections as above.  - PT/OT to continue. Pt may be able to return to ALF with assistance/supervision.  Stage IIIb CKD: Based on current Cr values. SCr 1.17. - Avoid nephrotoxins  AST elevation: Fits with covid infection. Resolved.  T2DM: Diet-controlled. HbA1c 5.5%.  - No Tx indicated. Avoid hypoglycemia.   HTN:  - No changes at this time.  HLD:  - Hold statin for a few days after paxlovid Tx is completed.   DVT prophylaxis: Heparin gtt Code Status: Full Family Communication: None available Disposition Plan:  Status is: Inpatient  Remains inpatient appropriate because:Hemodynamically unstable, Altered mental status, and Inpatient level of care appropriate due to severity of illness  Dispo: The patient is from: ALF              Anticipated d/c is to:  TBD  Likely return to ALF w/HH and supervision for OOB mobility.              Patient currently is not medically stable to d/c.   Difficult to place patient No  Consultants:  None  Procedures:  Echocardiogram 08/11/2021: Limited acoustic windows. LVEF 60-65%, G1DD, no WMA, RV normal, IVC normal, valves unremarkable.   Antimicrobials: Paxlovid  Subjective: Sitting in chair, eating breakfast without any issues at all. Denies dyspnea and chest pain specifically. No palpitations.  Objective: Vitals:   08/13/21 0917 08/13/21 1038 08/13/21 1221 08/13/21 1500  BP: (!) 188/74 132/71 134/70   Pulse: (!) 53 66 (!) 58   Resp:   16 17  Temp:    98.4 F (36.9 C)   TempSrc:   Oral   SpO2:   93%   Weight:      Height:        Intake/Output Summary (Last 24 hours) at 08/13/2021 1722 Last data filed at 08/13/2021 1500 Gross per 24 hour  Intake 268.38 ml  Output 0 ml  Net 268.38 ml   Filed Weights   08/09/21 1841  Weight: 74.1 kg   Gen: Elderly female in no distress Pulm: Nonlabored breathing room air. Clear. CV: Regular bradycardia. No murmur, rub, or gallop. No JVD, no dependent edema. GI: Abdomen soft, non-tender, non-distended, with normoactive bowel sounds.  Ext: Warm, no deformities Skin: No rashes, lesions or ulcers on visualized skin. Neuro: Alert and oriented. No focal neurological deficits. Psych: Judgement and insight appear fair. Mood euthymic & affect congruent. Behavior is appropriate.    Data Reviewed: I have personally reviewed following labs and imaging studies  CBC: Recent Labs  Lab 08/08/21 1038 08/09/21 1053 08/10/21 0300 08/11/21 0017 08/12/21 0404 08/13/21 0706  WBC 8.6 8.8 6.1 10.2 10.1 12.2*  NEUTROABS 5.7 6.3 4.5 8.3* 8.2*  --   HGB 13.8 14.8 14.3 14.1 13.7 13.8  HCT 43.3 46.7* 43.9 42.4 41.3 42.2  MCV 91.4 90.9 88.9 88.0 87.1 87.7  PLT 186 203 186 196 230 213   Basic Metabolic Panel: Recent Labs  Lab 08/10/21 0300 08/11/21 0017 08/12/21 0404 08/12/21 2307 08/13/21 0706  NA 135 140 139 141 140  K 4.1 4.3 4.3 4.5 4.6  CL 98 105 106 105 106  CO2 26 25 26 27 25   GLUCOSE 157* 164* 155* 164* 153*  BUN 22 30* 30* 33* 32*  CREATININE 1.02* 1.24* 1.11* 1.05* 1.05*  CALCIUM 8.8* 9.2 9.1 9.1 9.5   GFR: Estimated Creatinine Clearance: 37.2 mL/min (A) (by C-G formula based on SCr of 1.05 mg/dL (H)). Liver Function Tests: Recent Labs  Lab 08/09/21 1053 08/10/21 0300 08/11/21 0017 08/12/21 0404 08/12/21 2307  AST 58* 46* 42* 30 24  ALT 20 21 20 19 19   ALKPHOS 62 57 64 55 56  BILITOT 0.8 0.7 0.5 0.5 0.5  PROT 7.7 7.2 7.1 6.9 7.0  ALBUMIN 4.0 3.7 3.5 3.5 3.6   No results for  input(s): LIPASE, AMYLASE in the last 168 hours. No results for input(s): AMMONIA in the last 168 hours. Coagulation Profile: Recent Labs  Lab 08/09/21 1053  INR 1.0   Cardiac Enzymes: No results for input(s): CKTOTAL, CKMB, CKMBINDEX, TROPONINI in the last 168 hours. BNP (last 3 results) No results for input(s): PROBNP in the last 8760 hours. HbA1C: No results for input(s): HGBA1C in the last 72 hours.  CBG: No results for input(s): GLUCAP in the last 168 hours. Lipid Profile: No results for input(s): CHOL, HDL, LDLCALC, TRIG, CHOLHDL, LDLDIRECT in the last 72 hours.  Thyroid Function Tests: Recent Labs    08/13/21 0706  TSH 1.069   Anemia Panel: No results for input(s): VITAMINB12, FOLATE, FERRITIN, TIBC, IRON, RETICCTPCT in the last 72 hours.  Urine analysis:    Component Value Date/Time   COLORURINE YELLOW 08/08/2021 1038   APPEARANCEUR CLEAR 08/08/2021 1038  LABSPEC 1.010 08/08/2021 1038   PHURINE 6.0 08/08/2021 1038   GLUCOSEU NEGATIVE 08/08/2021 1038   HGBUR TRACE (A) 08/08/2021 1038   BILIRUBINUR NEGATIVE 08/08/2021 1038   KETONESUR NEGATIVE 08/08/2021 1038   PROTEINUR NEGATIVE 08/08/2021 1038   UROBILINOGEN 0.2 05/13/2015 1849   NITRITE POSITIVE (A) 08/08/2021 1038   LEUKOCYTESUR SMALL (A) 08/08/2021 1038   Recent Results (from the past 240 hour(s))  Blood culture (routine x 2)     Status: None (Preliminary result)   Collection Time: 08/08/21 12:28 PM   Specimen: BLOOD  Result Value Ref Range Status   Specimen Description BLOOD RIGHT ANTECUBITAL  Final   Special Requests   Final    BOTTLES DRAWN AEROBIC AND ANAEROBIC Blood Culture adequate volume   Culture   Final    NO GROWTH 4 DAYS Performed at [facility], [address] [place], [place] [address]    Report Status PENDING  Incomplete  Blood culture (routine x 2)     Status: None (Preliminary result)   Collection Time: 08/08/21 12:28 PM   Specimen: BLOOD RIGHT WRIST  Result Value Ref  Range Status   Specimen Description BLOOD RIGHT WRIST  Final   Special Requests   Final    BOTTLES DRAWN AEROBIC AND ANAEROBIC Blood Culture adequate volume   Culture   Final    NO GROWTH 4 DAYS Performed at [facility], [address], [place], [place] [address]    Report Status PENDING  Incomplete  Resp Panel by RT-PCR (Flu A&B, Covid) Urine, Clean Catch     Status: Abnormal   Collection Time: 08/08/21 12:30 PM   Specimen: Urine, Clean Catch; Nasopharyngeal(NP) swabs in vial transport medium  Result Value Ref Range Status   SARS Coronavirus 2 by RT PCR POSITIVE (A) NEGATIVE Final    Comment: RESULT CALLED TO, READ BACK BY AND VERIFIED WITH: RN E BANKS [id number] AT 1415 BY CM (NOTE) SARS-CoV-2 target nucleic acids are DETECTED.  The SARS-CoV-2 RNA is generally detectable in upper respiratory specimens during the acute phase of infection. Positive results are indicative of the presence of the identified virus, but do not rule out bacterial infection or co-infection with other pathogens not detected by the test. Clinical correlation with patient history and other diagnostic information is necessary to determine patient infection status. The expected result is Negative.  Fact Sheet for Patients: BloggerCourse.com  Fact Sheet for Healthcare Providers: SeriousBroker.it  This test is not yet approved or cleared by the [place] FDA and  has been authorized for detection and/or diagnosis of SARS-CoV-2 by FDA under an Emergency Use Authorization (EUA).  This EUA will remain in effect (meaning this test can be u sed) for the duration of  the COVID-19 declaration under Section 564(b)(1) of the Act, 21 U.S.C. section 360bbb-3(b)(1), unless the authorization is terminated or revoked sooner.     Influenza A by PCR NEGATIVE NEGATIVE Final   Influenza B by PCR NEGATIVE NEGATIVE Final    Comment: (NOTE) The Xpert Xpress  SARS-CoV-2/FLU/RSV plus assay is intended as an aid in the diagnosis of influenza from Nasopharyngeal swab specimens and should not be used as a sole basis for treatment. Nasal washings and aspirates are unacceptable for Xpert Xpress SARS-CoV-2/FLU/RSV testing.  Fact Sheet for Patients: BloggerCourse.com  Fact Sheet for Healthcare Providers: SeriousBroker.it  This test is not yet approved or cleared by the [place] FDA and has been authorized for detection and/or diagnosis of SARS-CoV-2 by FDA  under an Emergency Use Authorization (EUA). This EUA will remain in effect (meaning this test can be used) for the duration of the COVID-19 declaration under Section 564(b)(1) of the Act, 21 U.S.C. section 360bbb-3(b)(1), unless the authorization is terminated or revoked.  Performed at [facility], [address], [place], [place] [address]   MRSA Next Gen by PCR, Nasal     Status: None   Collection Time: 08/10/21  1:36 AM   Specimen: Nasal Mucosa; Nasal Swab  Result Value Ref Range Status   MRSA by PCR Next Gen NOT DETECTED NOT DETECTED Final    Comment: (NOTE) The GeneXpert MRSA Assay (FDA approved for NASAL specimens only), is one component of a comprehensive MRSA colonization surveillance program. It is not intended to diagnose MRSA infection nor to guide or monitor treatment for MRSA infections. Test performance is not FDA approved in patients less than 64 years old. Performed at [facility], [address] [place], [place] [address]       Radiology Studies: No results found.  Scheduled Meds:  vitamin C  500 mg Oral Daily   calcium-vitamin D  2 tablet Oral Q breakfast   cephALEXin  500 mg Oral BID   Chlorhexidine Gluconate Cloth  6 each Topical Daily   docusate sodium  100 mg Oral BID   mouth rinse  15 mL Mouth Rinse BID   melatonin  3 mg Oral QHS   memantine  10 mg Oral BID   metoprolol  tartrate  12.5 mg Oral BID   sertraline  100 mg Oral QHS   sodium chloride flush  3 mL Intravenous Q12H   vitamin B-12  1,000 mcg Oral BID   zinc sulfate  220 mg Oral Daily   Continuous Infusions:  sodium chloride     heparin 650 Units/hr (08/13/21 1038)     LOS: 3 days   Time spent: 25 minutes.  Tyrone Nine, MD [facility] [email or web address] 08/13/2021, 5:22 PM

## 2021-08-13 NOTE — Care Management Important Message (Signed)
Important Message  Patient Details IM Letter given to the Patient. Name: [name] MRN: [id number] Date of Birth: 1932-04-19   Medicare Important Message Given:  Yes     Caren Macadam 08/13/2021, 10:36 AM

## 2021-08-13 NOTE — Progress Notes (Signed)
Physical Therapy Treatment Patient Details Name: [name] MRN: [id number] DOB: 30-Nov-1931 Today's Date: 08/13/2021   History of Present Illness 87 y.o. female admitted on 08/09/21 for increased confusion.  Pt dx with COVID 19, UTI, new onset A-fib.  Pt with significant PMH of Alzheimer's dementia, anemia, CKD 3, DM (diet controlled), HTN, OA (hand and L knee pain), syncope, L knee arthroscopy, ORIF R humerus fx.    PT Comments    General Comments: AxO x 2 Hx Dementia/Alheimers but able to follow commands, express needs and pleasantly "witty". OOB in recliner.  General transfer comment: 25% VC's for direction and initail poesterior LOB with sit to stand from recliner with no self correction.  Also, unsteady with turning in bathroom and backing up to toilet.  Min Assist "Hands on all times"for safety. General Gait Details: Pt with staggering gait pattern, difficulty walking backwards and turning.  Limited space as we had to stay in the room d/t COVID status.  Mild c/o dizziness.  RA 94% and HR 88.  MAX c/o fatigue.  COVID weak. Returned to recliner and requested coffee.  "Sugar and creamers".   Pt is COVID weak and slightly unsteady but will do best to return to ALF.  Pt could benefit from a Rollator Walker if she doesn't already have one.   Recommendations for follow up therapy are one component of a multi-disciplinary discharge planning process, led by the attending physician.  Recommendations may be updated based on patient status, additional functional criteria and insurance authorization.  Follow Up Recommendations  Home health PT;Supervision for mobility/OOB (return to ALF)     Equipment Recommendations  Other (comment) (Rollator [employer])    Recommendations for Other Services       Precautions / Restrictions Precautions Precautions: Fall Precaution Comments: COVID contact precautions, monitor O2, vitals, Hx Dementia     Mobility  Bed Mobility               General bed  mobility comments: OOB in recliner    Transfers Overall transfer level: Needs assistance Equipment used: Rolling walker (2 wheeled) Transfers: Sit to/from [employer] Sit to Stand: Min assist Stand pivot transfers: Min assist       General transfer comment: 25% VC's for direction and initail poesterior LOB with sit to stand from recliner with no self correction.  Also, unsteady with turning in bathroom and backing up to toilet.  Min Assist "Hands on all times"for safety.  Ambulation/Gait Ambulation/Gait assistance: Min assist Gait Distance (Feet): 30 Feet (15 feet x 2 to and from bathroom) Assistive device: Rolling walker (2 wheeled) Gait Pattern/deviations: Step-through pattern;Staggering left;Staggering right Gait velocity: decreased   General Gait Details: Pt with staggering gait pattern, difficulty walking backwards and turning.  Limited space as we had to stay in the room d/t COVID status.  Mild c/o dizziness.  RA 94% and HR 88.  MAX c/o fatigue.  COVID weak.   Stairs             Wheelchair Mobility    Modified Rankin (Stroke Patients Only)       Balance                                            Cognition Arousal/Alertness: Awake/alert Behavior During Therapy: WFL for tasks assessed/performed Overall Cognitive Status: No family/caregiver present to determine baseline cognitive functioning  General Comments: AxO x 2 Hx Dementia/Alheimers but able to follow commands, express needs and pleasantly "witty".      Exercises      General Comments        Pertinent Vitals/Pain Pain Assessment: No/denies pain    Home Living                      Prior Function            PT Goals (current goals can now be found in the care plan section) Progress towards PT goals: Progressing toward goals    Frequency    Min 3X/week      PT Plan Current plan remains  appropriate    Co-evaluation              AM-PAC PT "6 Clicks" Mobility   Outcome Measure  Help needed turning from your back to your side while in a flat bed without using bedrails?: A Little Help needed moving from lying on your back to sitting on the side of a flat bed without using bedrails?: A Little Help needed moving to and from a bed to a chair (including a wheelchair)?: A Little Help needed standing up from a chair using your arms (e.g., wheelchair or bedside chair)?: A Little Help needed to walk in hospital room?: A Little Help needed climbing 3-5 steps with a railing? : A Little 6 Click Score: 18    End of Session Equipment Utilized During Treatment: Gait belt Activity Tolerance: Patient tolerated treatment well Patient left: in chair;with call bell/phone within reach;with chair alarm set Nurse Communication: Mobility status (reported to NT pt had a large normal formed BM) PT Visit Diagnosis: Muscle weakness (generalized) (M62.81);Difficulty in walking, not elsewhere classified (R26.2)     Time: 8588-5027 PT Time Calculation (min) (ACUTE ONLY): 27 min  Charges:  $Gait Training: 8-22 mins $Therapeutic Activity: 8-22 mins                     {Niyla Marone  PTA Acute  Rehabilitation Services Pager      [phone] Office      [phone]

## 2021-08-13 NOTE — Progress Notes (Signed)
ANTICOAGULATION CONSULT NOTE - follow up  Pharmacy Consult for IV heparin Indication: atrial fibrillation  Allergies  Allergen Reactions   Sulfa Antibiotics Hives, Diarrhea, Nausea And Vomiting and Swelling    Facial swelling ALSO   Latex Rash    Patient Measurements: Height: 5\' 4"  (162.6 cm) Weight: 74.1 kg (163 lb 5.8 oz) IBW/kg (Calculated) : 54.7 Heparin Dosing Weight: 70 kg  Vital Signs: Temp: 98.4 F (36.9 C) (09/23 1221) Temp Source: Oral (09/23 1221) BP: 134/70 (09/23 1221) Pulse Rate: 58 (09/23 1221)  Labs: Recent Labs    08/11/21 0017 08/11/21 0849 08/12/21 0404 08/12/21 1310 08/12/21 2240 08/12/21 2307 08/13/21 0706 08/13/21 1836  HGB 14.1  --  13.7  --   --   --  13.8  --   HCT 42.4  --  41.3  --   --   --  42.2  --   PLT 196  --  230  --   --   --  213  --   HEPARINUNFRC  --    < > 0.87*   < > 0.46  --  0.33 0.43  CREATININE 1.24*  --  1.11*  --   --  1.05* 1.05*  --    < > = values in this interval not displayed.    Estimated Creatinine Clearance: 37.2 mL/min (A) (by C-G formula based on SCr of 1.05 mg/dL (H)).  Medical History: Past Medical History:  Diagnosis Date   Allergies    Alzheimer disease (HCC)    STABLE   Anemia, iron deficiency 12/12/2011   Arthritis    RF   CKD (chronic kidney disease)    STAGE 3   Diabetes mellitus    no mes, diet only    Diarrhea    RESOLVED   Hypercholesterolemia    Hyperlipemia    Hypertension    Major depression    OA (osteoarthritis)    HAND/LEFT KNEE PAIN MILD   Osteoporosis    Syncope      Assessment: 87 yo female presenting with cough and shortness of breath, COVID positive. Found to have been in Afib with RVR while in emergency room - new diagnosis.  No anticoagulation noted PTA. Pharmacy consulted for IV heparin dosing.   Today, 08/13/21:  HL = 0.33 is therapeutic on heparin infusion of 600 units/hr, however has drifted down to lower end of therapeutic range Confirmed with RN that  heparin infusing at correct rate. No interruptions in infusion. No signs of bleeding.  CBC: Stable, WNL  Today, 08/13/21 8:21 PM  HL remains therapeutic at 0.43 No bleeding reported  Goal of Therapy:  Heparin level 0.3-0.7 units/ml Monitor platelets by anticoagulation protocol: Yes  Plan:  Continue heparin infusion at 650 units/hr Daily CBC, HL while on heparin infusion.  Monitor for signs of bleeding Noted plan to transition to apixaban after Paxlovid completed. Last dose of Paxlovid on 9/22 @ 1008  Per IDSA recommendation, apixaban 5 mg or 10 mg should be dose reduced by 50% until 3 days after Paxlovid  10/22 08/13/21 8:20 PM

## 2021-08-13 NOTE — Progress Notes (Signed)
Occupational Therapy Treatment Patient Details Name: [name] MRN: [id number] DOB: [birth year] Today's Date: 08/13/2021   History of present illness 87 y.o. female admitted on 08/09/21 for increased confusion.  Pt dx with COVID 19, UTI, new onset A-fib.  Pt with significant PMH of Alzheimer's dementia, anemia, CKD 3, DM (diet controlled), HTN, OA (hand and L knee pain), syncope, L knee arthroscopy, ORIF R humerus fx.   OT comments  Treatment focused on improving functional mobility, activity tolerance and participation in ADLs. Patient able to ambulate to bathroom with RW and min guard and perform toileting with min assist and wash hands at sink. Patient pleasant and confused and able to follow commands. Whether at ALF or home with family recommend 24/7 supervision and HH at discharge.   Recommendations for follow up therapy are one component of a multi-disciplinary discharge planning process, led by the attending physician.  Recommendations may be updated based on patient status, additional functional criteria and insurance authorization.    Follow Up Recommendations  Home health OT;Supervision/Assistance - 24 hour    Equipment Recommendations  None recommended by OT    Recommendations for Other Services      Precautions / Restrictions Precautions Precautions: Fall Precaution Comments: COVID contact precautions, monitor O2, vitals, Hx Dementia Restrictions Weight Bearing Restrictions: No       Mobility Bed Mobility Overal bed mobility: Needs Assistance             General bed mobility comments: OOB in recliner    Transfers Overall transfer level: Needs assistance Equipment used: Rolling walker (2 wheeled) Transfers: Sit to/from Stand Sit to Stand: Min guard Stand pivot transfers: Min guard       General transfer comment: Min guard to ambulate to bathroom and back to recliner and perform toilet transfer. verbal cues for walker use and hand placement.     Balance Overall balance assessment: Needs assistance Sitting-balance support: Feet supported Sitting balance-Leahy Scale: Good     Standing balance support: During functional activity Standing balance-Leahy Scale: Fair Standing balance comment: with toileting                           ADL either performed or assessed with clinical judgement   ADL Overall ADL's : Needs assistance/impaired     Grooming: Standing;Wash/dry hands;Min guard Grooming Details (indicate cue type and reason): stood at sink to wash hands with min guard.                 Toilet Transfer: Min guard;Regular Toilet;Grab bars;RW Statistician Details (indicate cue type and reason): ambulated to bathroom with min guard - verbal cues to back up and use grab bar Toileting- Clothing Manipulation and Hygiene: Sit to/from stand;Moderate assistance Toileting - Clothing Manipulation Details (indicate cue type and reason): assistance for managing hospital gown. Patient able to wipe - though with difficulty due to IV     Functional mobility during ADLs: Min guard;Rolling walker       Vision   Vision Assessment?: No apparent visual deficits   Perception     Praxis      Cognition Arousal/Alertness: Awake/alert Behavior During Therapy: WFL for tasks assessed/performed Overall Cognitive Status: History of cognitive impairments - at baseline                                 General Comments: Dementia. Alert to self and hospital.  Pleasant and able to follow commands.        Exercises     Shoulder Instructions       General Comments      Pertinent Vitals/ Pain       Pain Assessment: Faces Faces Pain Scale: Hurts a little bit Pain Location: R shoulder Pain Descriptors / Indicators: Grimacing;Discomfort Pain Intervention(s): Monitored during session  Home Living                                          Prior Functioning/Environment               Frequency  Min 2X/week        Progress Toward Goals  OT Goals(current goals can now be found in the care plan section)  Progress towards OT goals: Progressing toward goals  Acute Rehab OT Goals Patient Stated Goal: wants to go home OT Goal Formulation: With patient Time For Goal Achievement: 08/25/21 Potential to Achieve Goals: Good  Plan Discharge plan remains appropriate    Co-evaluation                 AM-PAC OT "6 Clicks" Daily Activity     Outcome Measure   Help from another person eating meals?: A Little Help from another person taking care of personal grooming?: A Little Help from another person toileting, which includes using toliet, bedpan, or urinal?: A Little Help from another person bathing (including washing, rinsing, drying)?: A Little Help from another person to put on and taking off regular upper body clothing?: A Little Help from another person to put on and taking off regular lower body clothing?: A Lot 6 Click Score: 17    End of Session Equipment Utilized During Treatment: Gait belt;Rolling walker  OT Visit Diagnosis: Unsteadiness on feet (R26.81)   Activity Tolerance Patient tolerated treatment well   Patient Left in chair;with call bell/phone within reach;with chair alarm set   Nurse Communication Mobility status;Other (comment) (IV site)        Time: 1950-9326 OT Time Calculation (min): 22 min  Charges: OT General Charges $OT Visit: 1 Visit OT Treatments $Self Care/Home Management : 8-22 mins  Waldron Session, OTR/L Acute Care Rehab Services  Office [phone] Pager: [phone]   Kelli Churn 08/13/2021, 2:15 PM

## 2021-08-14 LAB — CBC
HCT: 42.8 % (ref 36.0–46.0)
Hemoglobin: 14.1 g/dL (ref 12.0–15.0)
MCH: 28.8 pg (ref 26.0–34.0)
MCHC: 32.9 g/dL (ref 30.0–36.0)
MCV: 87.3 fL (ref 80.0–100.0)
Platelets: 257 10*3/uL (ref 150–400)
RBC: 4.9 MIL/uL (ref 3.87–5.11)
RDW: 13 % (ref 11.5–15.5)
WBC: 12.5 10*3/uL — ABNORMAL HIGH (ref 4.0–10.5)
nRBC: 0 % (ref 0.0–0.2)

## 2021-08-14 LAB — HEPARIN LEVEL (UNFRACTIONATED): Heparin Unfractionated: 0.38 IU/mL (ref 0.30–0.70)

## 2021-08-14 NOTE — Progress Notes (Signed)
ANTICOAGULATION CONSULT NOTE - follow up  Pharmacy Consult for IV heparin Indication: atrial fibrillation  Allergies  Allergen Reactions   Sulfa Antibiotics Hives, Diarrhea, Nausea And Vomiting and Swelling    Facial swelling ALSO   Latex Rash    Patient Measurements: Height: 5\' 4"  (162.6 cm) Weight: 74.1 kg (163 lb 5.8 oz) IBW/kg (Calculated) : 54.7 Heparin Dosing Weight: 70 kg  Vital Signs: Temp: 98.7 F (37.1 C) (09/24 0555) Temp Source: Oral (09/24 0555) BP: 171/56 (09/24 0555) Pulse Rate: 49 (09/24 0555)  Labs: Recent Labs    08/12/21 0404 08/12/21 1310 08/12/21 2307 08/13/21 0706 08/13/21 1836 08/14/21 0348  HGB 13.7  --   --  13.8  --  14.1  HCT 41.3  --   --  42.2  --  42.8  PLT 230  --   --  213  --  257  HEPARINUNFRC 0.87*   < >  --  0.33 0.43 0.38  CREATININE 1.11*  --  1.05* 1.05*  --   --    < > = values in this interval not displayed.    Estimated Creatinine Clearance: 37.2 mL/min (A) (by C-G formula based on SCr of 1.05 mg/dL (H)).  Medical History: Past Medical History:  Diagnosis Date   Allergies    Alzheimer disease (HCC)    STABLE   Anemia, iron deficiency 12/12/2011   Arthritis    RF   CKD (chronic kidney disease)    STAGE 3   Diabetes mellitus    no mes, diet only    Diarrhea    RESOLVED   Hypercholesterolemia    Hyperlipemia    Hypertension    Major depression    OA (osteoarthritis)    HAND/LEFT KNEE PAIN MILD   Osteoporosis    Syncope      Assessment: 87 yo female presenting with cough and shortness of breath, COVID positive. Found to have been in Afib with RVR while in emergency room - new diagnosis.  No anticoagulation noted PTA. Pharmacy consulted for IV heparin dosing.   Today, 08/14/21:  Confirmatory HL = 0.38 remains therapeutic on heparin infusion of 650 units/hr Confirmed with RN that heparin infusing at correct rate. No interruptions in infusion. No signs of bleeding.  CBC: Stable, WNL  Goal of Therapy:   Heparin level 0.3-0.7 units/ml Monitor platelets by anticoagulation protocol: Yes  Plan:  Continue heparin infusion at current rate of 650 mL/hr Daily CBC, HL while on heparin infusion. Recheck with AM labs tomorrow Monitor for signs of bleeding Noted plan to transition to apixaban after Paxlovid completed. Last dose of Paxlovid on 9/22 @ 1008  Per IDSA recommendation, apixaban 5 mg or 10 mg should be dose reduced by 50% until 3 days after Paxlovid  10/22, PharmD 08/14/21 8:37 AM

## 2021-08-14 NOTE — Progress Notes (Signed)
PROGRESS NOTE  [name]  EKC:[id number] DOB: Dec 24, 1931 DOA: 08/09/2021 PCP: Lupita Raider, MD   Brief Narrative: [name] is an 87 y.o. female with a history of Alzheimer's dementia, T2DM, HTN, HLD, and recent history of UTI diagnosed in ED discharged on keflex as well as covid-19 diagnosed 9/18 who returned from [facility] ALF for weakness, shortness of breath, cough, possible altered mental status from baseline. She was noted to be febrile to 100.24F in new-onset AFib with RVR (HR 164) for which diltiazem infusion was started. SpO2 noted to be 90-92%, d-dimer 1.13, CRP 6.4, AST 58, SCr 1.17. CTA chest without infiltrate or PE, though did see some mucous plugging.   Diltiazem infusion has been weaned with bradycardic response. Heparin gtt continued. Initially placed on supplemental oxygen for comfort, this has improved.  Assessment & Plan: Active Problems:   COVID-19   Acute metabolic encephalopathy  New onset atrial fibrillation: Has converted to NSR with sinus bradycardia. Echo without significant abnormalities, preserved LVEF, no WMA, unremarkable valves. TSH 1.069.  - Continues bradycardic which has resulted in holding home metoprolol. Decreased dose to 12.5mg  but has continued to be held due to HR <60bpm. - Continue stroke risk reduction: Change heparin > eliquis once paxlovid has gotten out of her system. She is unable to give herself lovenox injections and would not have assistance to do that at ALF.  Acute hypoxic respiratory failure due to covid-19 infection: SARS-CoV-2 PCR positive on 9/18. Elevated CRP in setting of negative procalcitonin, normal WBC on admission argues for true infection. - Weaned from O2. - Received renally-dosed paxlovid beginning 9/19 with planned 5 day course, stopped due to significant and sustained clinical improvement and need to initiate DOAC. Will allow bleed out of paxlovid over the weekend and initiation of eliquis 9/25.  - Stopped  decadron with rapid weaning from O2 and normalization of CRP.   - Encourage OOB, IS, FV, and awake proning if able - Continue airborne, contact precautions for 10 days from positive testing.  Acute metabolic encephalopathy on chronic Alzheimer's dementia: Has remained stable since shortly after admission, so acute encephalopathy seems to have resolved. - Continue home namenda, ?not on aricept - Delirium precautions  UTI:  - Plan to complete keflex 50mg  po BID x7 days total therapy.  Generalized weakness: Likely due to infections as above.  - PT/OT to continue. Pt may be able to return to ALF with assistance/supervision.  Stage IIIb CKD: Based on current Cr values. SCr 1.17. - Avoid nephrotoxins  AST elevation: Fits with covid infection. Resolved.  T2DM: Diet-controlled. HbA1c 5.5%.  - No Tx indicated. Avoid hypoglycemia.   HTN:  - No changes at this time.  HLD:  - Hold statin for a few days after paxlovid Tx is completed.   DVT prophylaxis: Heparin gtt Code Status: Full Family Communication: None available Disposition Plan:  Status is: Inpatient  Remains inpatient appropriate because:Hemodynamically unstable, Altered mental status, and Inpatient level of care appropriate due to severity of illness  Dispo: The patient is from: ALF              Anticipated d/c is to: ALF w/HH and supervision for OOB mobility on 9/26. Unable to discharge until that time due to need for oral anticoagulation to return and still having paxlovid effect (DDI)              Patient currently is not medically stable to d/c.   Difficult to place patient No  Consultants:  None  Procedures:  Echocardiogram 08/11/2021: Limited acoustic windows. LVEF 60-65%, G1DD, no WMA, RV normal, IVC normal, valves unremarkable.   Antimicrobials: Paxlovid   Subjective: No complaints. No bleeding. No dyspnea or pain. Eating ok.  Objective: Vitals:   08/13/21 1500 08/13/21 2149 08/14/21 0555 08/14/21 1031  BP:   (!) 173/73 (!) 171/56 135/67  Pulse:  (!) 49 (!) 49 62  Resp: 17  18 17   Temp:  97.8 F (36.6 C) 98.7 F (37.1 C) 97.8 F (36.6 C)  TempSrc:  Oral Oral Oral  SpO2:  93% 94% 95%  Weight:      Height:        Intake/Output Summary (Last 24 hours) at 08/14/2021 1248 Last data filed at 08/14/2021 1030 Gross per 24 hour  Intake 148.38 ml  Output 950 ml  Net -801.62 ml   Filed Weights   08/09/21 1841  Weight: 74.1 kg   Gen: Elderly female in no distress Pulm: Nonlabored breathing room air. Clear. CV: Regular bradycardia without murmur, rub, or gallop. No JVD, no dependent edema. GI: Abdomen soft, non-tender, non-distended, with normoactive bowel sounds.  Ext: Warm, no deformities Skin: No rashes, lesions or ulcers on visualized skin. Neuro: Alert and pleasantly confused No focal neurological deficits. Psych: Judgement and insight appear impaired. Mood euthymic & affect congruent. Behavior is appropriate.    Data Reviewed: I have personally reviewed following labs and imaging studies  CBC: Recent Labs  Lab 08/08/21 1038 08/09/21 1053 08/10/21 0300 08/11/21 0017 08/12/21 0404 08/13/21 0706 08/14/21 0348  WBC 8.6 8.8 6.1 10.2 10.1 12.2* 12.5*  NEUTROABS 5.7 6.3 4.5 8.3* 8.2*  --   --   HGB 13.8 14.8 14.3 14.1 13.7 13.8 14.1  HCT 43.3 46.7* 43.9 42.4 41.3 42.2 42.8  MCV 91.4 90.9 88.9 88.0 87.1 87.7 87.3  PLT 186 203 186 196 230 213 257   Basic Metabolic Panel: Recent Labs  Lab 08/10/21 0300 08/11/21 0017 08/12/21 0404 08/12/21 2307 08/13/21 0706  NA 135 140 139 141 140  K 4.1 4.3 4.3 4.5 4.6  CL 98 105 106 105 106  CO2 26 25 26 27 25   GLUCOSE 157* 164* 155* 164* 153*  BUN 22 30* 30* 33* 32*  CREATININE 1.02* 1.24* 1.11* 1.05* 1.05*  CALCIUM 8.8* 9.2 9.1 9.1 9.5   GFR: Estimated Creatinine Clearance: 37.2 mL/min (A) (by C-G formula based on SCr of 1.05 mg/dL (H)). Liver Function Tests: Recent Labs  Lab 08/09/21 1053 08/10/21 0300 08/11/21 0017  08/12/21 0404 08/12/21 2307  AST 58* 46* 42* 30 24  ALT 20 21 20 19 19   ALKPHOS 62 57 64 55 56  BILITOT 0.8 0.7 0.5 0.5 0.5  PROT 7.7 7.2 7.1 6.9 7.0  ALBUMIN 4.0 3.7 3.5 3.5 3.6   No results for input(s): LIPASE, AMYLASE in the last 168 hours. No results for input(s): AMMONIA in the last 168 hours. Coagulation Profile: Recent Labs  Lab 08/09/21 1053  INR 1.0   Cardiac Enzymes: No results for input(s): CKTOTAL, CKMB, CKMBINDEX, TROPONINI in the last 168 hours. BNP (last 3 results) No results for input(s): PROBNP in the last 8760 hours. HbA1C: No results for input(s): HGBA1C in the last 72 hours.  CBG: No results for input(s): GLUCAP in the last 168 hours. Lipid Profile: No results for input(s): CHOL, HDL, LDLCALC, TRIG, CHOLHDL, LDLDIRECT in the last 72 hours.  Thyroid Function Tests: Recent Labs    08/13/21 0706  TSH 1.069   Anemia Panel: No results for  input(s): VITAMINB12, FOLATE, FERRITIN, TIBC, IRON, RETICCTPCT in the last 72 hours.  Urine analysis:    Component Value Date/Time   COLORURINE YELLOW 08/08/2021 1038   APPEARANCEUR CLEAR 08/08/2021 1038   LABSPEC 1.010 08/08/2021 1038   PHURINE 6.0 08/08/2021 1038   GLUCOSEU NEGATIVE 08/08/2021 1038   HGBUR TRACE (A) 08/08/2021 1038   BILIRUBINUR NEGATIVE 08/08/2021 1038   KETONESUR NEGATIVE 08/08/2021 1038   PROTEINUR NEGATIVE 08/08/2021 1038   UROBILINOGEN 0.2 05/13/2015 1849   NITRITE POSITIVE (A) 08/08/2021 1038   LEUKOCYTESUR SMALL (A) 08/08/2021 1038   Recent Results (from the past 240 hour(s))  Blood culture (routine x 2)     Status: None (Preliminary result)   Collection Time: 08/08/21 12:28 PM   Specimen: BLOOD  Result Value Ref Range Status   Specimen Description BLOOD RIGHT ANTECUBITAL  Final   Special Requests   Final    BOTTLES DRAWN AEROBIC AND ANAEROBIC Blood Culture adequate volume   Culture   Final    NO GROWTH 4 DAYS Performed at [facility], [address], [place],  [place] [address]    Report Status PENDING  Incomplete  Blood culture (routine x 2)     Status: None (Preliminary result)   Collection Time: 08/08/21 12:28 PM   Specimen: BLOOD RIGHT WRIST  Result Value Ref Range Status   Specimen Description BLOOD RIGHT WRIST  Final   Special Requests   Final    BOTTLES DRAWN AEROBIC AND ANAEROBIC Blood Culture adequate volume   Culture   Final    NO GROWTH 4 DAYS Performed at [facility], [address], [place], [place] [address]    Report Status PENDING  Incomplete  Resp Panel by RT-PCR (Flu A&B, Covid) Urine, Clean Catch     Status: Abnormal   Collection Time: 08/08/21 12:30 PM   Specimen: Urine, Clean Catch; Nasopharyngeal(NP) swabs in vial transport medium  Result Value Ref Range Status   SARS Coronavirus 2 by RT PCR POSITIVE (A) NEGATIVE Final    Comment: RESULT CALLED TO, READ BACK BY AND VERIFIED WITH: RN E BANKS [id number] AT 1415 BY CM (NOTE) SARS-CoV-2 target nucleic acids are DETECTED.  The SARS-CoV-2 RNA is generally detectable in upper respiratory specimens during the acute phase of infection. Positive results are indicative of the presence of the identified virus, but do not rule out bacterial infection or co-infection with other pathogens not detected by the test. Clinical correlation with patient history and other diagnostic information is necessary to determine patient infection status. The expected result is Negative.  Fact Sheet for Patients: BloggerCourse.com  Fact Sheet for Healthcare Providers: SeriousBroker.it  This test is not yet approved or cleared by the [place] FDA and  has been authorized for detection and/or diagnosis of SARS-CoV-2 by FDA under an Emergency Use Authorization (EUA).  This EUA will remain in effect (meaning this test can be u sed) for the duration of  the COVID-19 declaration under Section 564(b)(1) of the Act, 21 U.S.C. section 360bbb-3(b)(1),  unless the authorization is terminated or revoked sooner.     Influenza A by PCR NEGATIVE NEGATIVE Final   Influenza B by PCR NEGATIVE NEGATIVE Final    Comment: (NOTE) The Xpert Xpress SARS-CoV-2/FLU/RSV plus assay is intended as an aid in the diagnosis of influenza from Nasopharyngeal swab specimens and should not be used as a sole basis for treatment. Nasal washings and aspirates are unacceptable for Xpert Xpress SARS-CoV-2/FLU/RSV testing.  Fact Sheet for  Patients: BloggerCourse.com  Fact Sheet for Healthcare Providers: SeriousBroker.it  This test is not yet approved or cleared by the [place] FDA and has been authorized for detection and/or diagnosis of SARS-CoV-2 by FDA under an Emergency Use Authorization (EUA). This EUA will remain in effect (meaning this test can be used) for the duration of the COVID-19 declaration under Section 564(b)(1) of the Act, 21 U.S.C. section 360bbb-3(b)(1), unless the authorization is terminated or revoked.  Performed at [facility], [address], [place], [place] [address]   MRSA Next Gen by PCR, Nasal     Status: None   Collection Time: 08/10/21  1:36 AM   Specimen: Nasal Mucosa; Nasal Swab  Result Value Ref Range Status   MRSA by PCR Next Gen NOT DETECTED NOT DETECTED Final    Comment: (NOTE) The GeneXpert MRSA Assay (FDA approved for NASAL specimens only), is one component of a comprehensive MRSA colonization surveillance program. It is not intended to diagnose MRSA infection nor to guide or monitor treatment for MRSA infections. Test performance is not FDA approved in patients less than 61 years old. Performed at [facility], [address], [place], [place] [address]       Radiology Studies: No results found.  Scheduled Meds:  vitamin C  500 mg Oral Daily   calcium-vitamin D  2 tablet Oral Q breakfast   cephALEXin  500 mg Oral BID    docusate sodium  100 mg Oral BID   mouth rinse  15 mL Mouth Rinse BID   melatonin  3 mg Oral QHS   memantine  10 mg Oral BID   metoprolol tartrate  12.5 mg Oral BID   sertraline  100 mg Oral QHS   sodium chloride flush  3 mL Intravenous Q12H   vitamin B-12  1,000 mcg Oral BID   zinc sulfate  220 mg Oral Daily   Continuous Infusions:  sodium chloride     heparin 650 Units/hr (08/13/21 1038)     LOS: 4 days   Time spent: 25 minutes.  Tyrone Nine, MD [facility] [email or web address] 08/14/2021, 12:48 PM

## 2021-08-14 NOTE — Progress Notes (Signed)
Patient had a 7 beat run of VT at 1500. VSS, pt asymptomatic. Will make MD Grunz aware.

## 2021-08-15 LAB — CBC
HCT: 41.8 % (ref 36.0–46.0)
Hemoglobin: 13.8 g/dL (ref 12.0–15.0)
MCH: 29.2 pg (ref 26.0–34.0)
MCHC: 33 g/dL (ref 30.0–36.0)
MCV: 88.6 fL (ref 80.0–100.0)
Platelets: 246 10*3/uL (ref 150–400)
RBC: 4.72 MIL/uL (ref 3.87–5.11)
RDW: 12.9 % (ref 11.5–15.5)
WBC: 14.5 10*3/uL — ABNORMAL HIGH (ref 4.0–10.5)
nRBC: 0 % (ref 0.0–0.2)

## 2021-08-15 LAB — HEPARIN LEVEL (UNFRACTIONATED): Heparin Unfractionated: 0.35 IU/mL (ref 0.30–0.70)

## 2021-08-15 LAB — GLUCOSE, CAPILLARY: Glucose-Capillary: 113 mg/dL — ABNORMAL HIGH (ref 70–99)

## 2021-08-15 NOTE — Progress Notes (Signed)
ANTICOAGULATION CONSULT NOTE - follow up  Pharmacy Consult for IV heparin Indication: atrial fibrillation  Allergies  Allergen Reactions   Sulfa Antibiotics Hives, Diarrhea, Nausea And Vomiting and Swelling    Facial swelling ALSO   Latex Rash    Patient Measurements: Height: 5\' 4"  (162.6 cm) Weight: 74.1 kg (163 lb 5.8 oz) IBW/kg (Calculated) : 54.7 Heparin Dosing Weight: 70 kg  Vital Signs: Temp: 97.7 F (36.5 C) (09/25 0326) Temp Source: Oral (09/25 0326) BP: 143/72 (09/25 0326) Pulse Rate: 50 (09/25 0326)  Labs: Recent Labs    08/12/21 2240 08/12/21 2307 08/13/21 0706 08/13/21 1836 08/14/21 0348 08/15/21 0350 08/15/21 0352  HGB   < >  --  13.8  --  14.1 13.8  --   HCT  --   --  42.2  --  42.8 41.8  --   PLT  --   --  213  --  257 246  --   HEPARINUNFRC  --   --  0.33 0.43 0.38  --  0.35  CREATININE  --  1.05* 1.05*  --   --   --   --    < > = values in this interval not displayed.    Estimated Creatinine Clearance: 37.2 mL/min (A) (by C-G formula based on SCr of 1.05 mg/dL (H)).  Medical History: Past Medical History:  Diagnosis Date   Allergies    Alzheimer disease (HCC)    STABLE   Anemia, iron deficiency 12/12/2011   Arthritis    RF   CKD (chronic kidney disease)    STAGE 3   Diabetes mellitus    no mes, diet only    Diarrhea    RESOLVED   Hypercholesterolemia    Hyperlipemia    Hypertension    Major depression    OA (osteoarthritis)    HAND/LEFT KNEE PAIN MILD   Osteoporosis    Syncope      Assessment: 87 yo female presenting with cough and shortness of breath, COVID positive. Found to have been in Afib with RVR while in emergency room - new diagnosis.  No anticoagulation noted PTA. Pharmacy consulted for IV heparin dosing.   Today, 08/15/21:  HL = 0.35 remains therapeutic on heparin infusion of 650 units/hr Confirmed with RN that heparin infusing at correct rate. No interruptions in infusion. No signs of bleeding.  CBC: Stable,  WNL  Goal of Therapy:  Heparin level 0.3-0.7 units/ml Monitor platelets by anticoagulation protocol: Yes  Plan:  Continue heparin infusion at current rate of 650 mL/hr Daily CBC, HL while on heparin infusion. Monitor for signs of bleeding Noted plan to transition to apixaban after Paxlovid completed. Last dose of Paxlovid on 9/22 @ 1008  Per IDSA recommendation, apixaban 5 mg or 10 mg should be dose reduced by 50% until 3 days after Paxlovid  10/22, PharmD 08/15/21 9:27 AM

## 2021-08-15 NOTE — Progress Notes (Signed)
PROGRESS NOTE  [name]  IHK:[id number] DOB: 06-10-1932 DOA: 08/09/2021 PCP: Lupita Raider, MD   Brief Narrative: [name] is an 87 y.o. female with a history of Alzheimer's dementia, T2DM, HTN, HLD, and recent history of UTI diagnosed in ED discharged on keflex as well as covid-19 diagnosed 9/18 who returned from [facility]F for weakness, shortness of breath, cough, possible altered mental status from baseline. She was noted to be febrile to 100.68F in new-onset AFib with RVR.   Assessment & Plan:  New onset atrial fibrillation:  - Has converted to NSR with sinus bradycardia. Echo without significant abnormalities, preserved LVEF, no WMA, unremarkable valves. TSH 1.069.  -Continues to have some mild bradycardia, metoprolol on hold -Transition from heparin > eliquis 08/16/2021 pending paxlovid clearance.  Acute hypoxic respiratory failure due to covid-19 infection, resolving:  - SARS-CoV-2 PCR initially positive on 9/18. Elevated CRP in setting of negative procalcitonin, normal WBC on admission argues for true infection. -Currently on room air - Stopped decadron with rapid weaning from O2 and normalization of CRP.   - Encourage OOB, IS, FV, and awake proning if able - Continue airborne, contact precautions for 10 days from positive testing through 08/19/2021.  Acute metabolic encephalopathy on chronic Alzheimer's dementia, ongoing:  -Appears to be approaching baseline - Continue home namenda -does not appear to be on Aricept - Delirium precautions ongoing  UTI, POA, resolving: - Does not meet sepsis criteria -Keflex completed  Generalized weakness: Likely due to infections as above.  - PT/OT to continue. Pt may be able to return to ALF with assistance/supervision.  Stage IIIb CKD: Based on current Cr values. SCr 1.17. - Avoid nephrotoxins  AST elevation: Fits with covid infection. Resolved.  T2DM: Diet-controlled. HbA1c 5.5%.  - No Tx indicated. Avoid  hypoglycemia.   HTN:  - No changes at this time.  HLD:  - Hold statin for a few days after paxlovid Tx is completed.   DVT prophylaxis: Heparin gtt Code Status: Full Family Communication: None available Disposition Plan:  Status is: Inpatient  Remains inpatient appropriate because:Hemodynamically unstable, Altered mental status, and Inpatient level of care appropriate due to severity of illness  Dispo: The patient is from: ALF              Anticipated d/c is to: ALF 08/16/2021   Difficult to place patient No  Consultants:  None  Procedures:  Echocardiogram 08/11/2021: Limited acoustic windows. LVEF 60-65%, G1DD, no WMA, RV normal, IVC normal, valves unremarkable.   Antimicrobials: Paxlovid   Subjective: No acute issues or events overnight review of systems limited given mental status baseline  Objective: Vitals:   08/14/21 1517 08/14/21 1948 08/14/21 2233 08/15/21 0326  BP: (!) 141/66 (!) 143/68  (!) 143/72  Pulse: 60 64 63 (!) 50  Resp: 16 14  16   Temp:  97.8 F (36.6 C)  97.7 F (36.5 C)  TempSrc:  Oral  Oral  SpO2: 94% 93%  95%  Weight:      Height:        Intake/Output Summary (Last 24 hours) at 08/15/2021 0819 Last data filed at 08/15/2021 0515 Gross per 24 hour  Intake --  Output 550 ml  Net -550 ml    Filed Weights   08/09/21 1841  Weight: 74.1 kg   Gen: Elderly female in no distress Pulm: Nonlabored breathing room air. Clear. CV: Regular bradycardia without murmur, rub, or gallop. No JVD, no dependent edema. GI: Abdomen soft, non-tender, non-distended, with normoactive  bowel sounds.  Ext: Warm, no deformities Skin: No rashes, lesions or ulcers on visualized skin. Neuro: Alert and pleasantly confused No focal neurological deficits. Psych: Judgement and insight appear impaired. Mood euthymic & affect congruent. Behavior is appropriate.    Data Reviewed: I have personally reviewed following labs and imaging studies  CBC: Recent Labs  Lab  08/08/21 1038 08/09/21 1053 08/10/21 0300 08/11/21 0017 08/12/21 0404 08/13/21 0706 08/14/21 0348 08/15/21 0350  WBC 8.6 8.8 6.1 10.2 10.1 12.2* 12.5* 14.5*  NEUTROABS 5.7 6.3 4.5 8.3* 8.2*  --   --   --   HGB 13.8 14.8 14.3 14.1 13.7 13.8 14.1 13.8  HCT 43.3 46.7* 43.9 42.4 41.3 42.2 42.8 41.8  MCV 91.4 90.9 88.9 88.0 87.1 87.7 87.3 88.6  PLT 186 203 186 196 230 213 257 246    Basic Metabolic Panel: Recent Labs  Lab 08/10/21 0300 08/11/21 0017 08/12/21 0404 08/12/21 2307 08/13/21 0706  NA 135 140 139 141 140  K 4.1 4.3 4.3 4.5 4.6  CL 98 105 106 105 106  CO2 26 25 26 27 25   GLUCOSE 157* 164* 155* 164* 153*  BUN 22 30* 30* 33* 32*  CREATININE 1.02* 1.24* 1.11* 1.05* 1.05*  CALCIUM 8.8* 9.2 9.1 9.1 9.5    GFR: Estimated Creatinine Clearance: 37.2 mL/min (A) (by C-G formula based on SCr of 1.05 mg/dL (H)). Liver Function Tests: Recent Labs  Lab 08/09/21 1053 08/10/21 0300 08/11/21 0017 08/12/21 0404 08/12/21 2307  AST 58* 46* 42* 30 24  ALT 20 21 20 19 19   ALKPHOS 62 57 64 55 56  BILITOT 0.8 0.7 0.5 0.5 0.5  PROT 7.7 7.2 7.1 6.9 7.0  ALBUMIN 4.0 3.7 3.5 3.5 3.6    No results for input(s): LIPASE, AMYLASE in the last 168 hours. No results for input(s): AMMONIA in the last 168 hours. Coagulation Profile: Recent Labs  Lab 08/09/21 1053  INR 1.0    Cardiac Enzymes: No results for input(s): CKTOTAL, CKMB, CKMBINDEX, TROPONINI in the last 168 hours. BNP (last 3 results) No results for input(s): PROBNP in the last 8760 hours. HbA1C: No results for input(s): HGBA1C in the last 72 hours.  CBG: No results for input(s): GLUCAP in the last 168 hours. Lipid Profile: No results for input(s): CHOL, HDL, LDLCALC, TRIG, CHOLHDL, LDLDIRECT in the last 72 hours.  Thyroid Function Tests: Recent Labs    08/13/21 0706  TSH 1.069    Anemia Panel: No results for input(s): VITAMINB12, FOLATE, FERRITIN, TIBC, IRON, RETICCTPCT in the last 72 hours.  Urine  analysis:    Component Value Date/Time   COLORURINE YELLOW 08/08/2021 1038   APPEARANCEUR CLEAR 08/08/2021 1038   LABSPEC 1.010 08/08/2021 1038   PHURINE 6.0 08/08/2021 1038   GLUCOSEU NEGATIVE 08/08/2021 1038   HGBUR TRACE (A) 08/08/2021 1038   BILIRUBINUR NEGATIVE 08/08/2021 1038   KETONESUR NEGATIVE 08/08/2021 1038   PROTEINUR NEGATIVE 08/08/2021 1038   UROBILINOGEN 0.2 05/13/2015 1849   NITRITE POSITIVE (A) 08/08/2021 1038   LEUKOCYTESUR SMALL (A) 08/08/2021 1038   Recent Results (from the past 240 hour(s))  Blood culture (routine x 2)     Status: None (Preliminary result)   Collection Time: 08/08/21 12:28 PM   Specimen: BLOOD  Result Value Ref Range Status   Specimen Description BLOOD RIGHT ANTECUBITAL  Final   Special Requests   Final    BOTTLES DRAWN AEROBIC AND ANAEROBIC Blood Culture adequate volume   Culture   Final  NO GROWTH 4 DAYS Performed at [facility], [address], [place], [place] [address]    Report Status PENDING  Incomplete  Blood culture (routine x 2)     Status: None (Preliminary result)   Collection Time: 08/08/21 12:28 PM   Specimen: BLOOD RIGHT WRIST  Result Value Ref Range Status   Specimen Description BLOOD RIGHT WRIST  Final   Special Requests   Final    BOTTLES DRAWN AEROBIC AND ANAEROBIC Blood Culture adequate volume   Culture   Final    NO GROWTH 4 DAYS Performed at [facility], [address] [place], [place] [address]    Report Status PENDING  Incomplete  Resp Panel by RT-PCR (Flu A&B, Covid) Urine, Clean Catch     Status: Abnormal   Collection Time: 08/08/21 12:30 PM   Specimen: Urine, Clean Catch; Nasopharyngeal(NP) swabs in vial transport medium  Result Value Ref Range Status   SARS Coronavirus 2 by RT PCR POSITIVE (A) NEGATIVE Final    Comment: RESULT CALLED TO, READ BACK BY AND VERIFIED WITH: RN E BANKS [id number] AT 1415 BY CM (NOTE) SARS-CoV-2 target nucleic acids are DETECTED.  The SARS-CoV-2 RNA is generally  detectable in upper respiratory specimens during the acute phase of infection. Positive results are indicative of the presence of the identified virus, but do not rule out bacterial infection or co-infection with other pathogens not detected by the test. Clinical correlation with patient history and other diagnostic information is necessary to determine patient infection status. The expected result is Negative.  Fact Sheet for Patients: BloggerCourse.com  Fact Sheet for Healthcare Providers: SeriousBroker.it  This test is not yet approved or cleared by the [place] FDA and  has been authorized for detection and/or diagnosis of SARS-CoV-2 by FDA under an Emergency Use Authorization (EUA).  This EUA will remain in effect (meaning this test can be u sed) for the duration of  the COVID-19 declaration under Section 564(b)(1) of the Act, 21 U.S.C. section 360bbb-3(b)(1), unless the authorization is terminated or revoked sooner.     Influenza A by PCR NEGATIVE NEGATIVE Final   Influenza B by PCR NEGATIVE NEGATIVE Final    Comment: (NOTE) The Xpert Xpress SARS-CoV-2/FLU/RSV plus assay is intended as an aid in the diagnosis of influenza from Nasopharyngeal swab specimens and should not be used as a sole basis for treatment. Nasal washings and aspirates are unacceptable for Xpert Xpress SARS-CoV-2/FLU/RSV testing.  Fact Sheet for Patients: BloggerCourse.com  Fact Sheet for Healthcare Providers: SeriousBroker.it  This test is not yet approved or cleared by the [place] FDA and has been authorized for detection and/or diagnosis of SARS-CoV-2 by FDA under an Emergency Use Authorization (EUA). This EUA will remain in effect (meaning this test can be used) for the duration of the COVID-19 declaration under Section 564(b)(1) of the Act, 21 U.S.C. section 360bbb-3(b)(1), unless the  authorization is terminated or revoked.  Performed at [facility], [address], [place], [place] [address]   MRSA Next Gen by PCR, Nasal     Status: None   Collection Time: 08/10/21  1:36 AM   Specimen: Nasal Mucosa; Nasal Swab  Result Value Ref Range Status   MRSA by PCR Next Gen NOT DETECTED NOT DETECTED Final    Comment: (NOTE) The GeneXpert MRSA Assay (FDA approved for NASAL specimens only), is one component of a comprehensive MRSA colonization surveillance program. It is not intended to diagnose MRSA infection nor to  guide or monitor treatment for MRSA infections. Test performance is not FDA approved in patients less than 79 years old. Performed at [facility], [address] [place], [place] [address]       Radiology Studies: No results found.  Scheduled Meds:  vitamin C  500 mg Oral Daily   calcium-vitamin D  2 tablet Oral Q breakfast   docusate sodium  100 mg Oral BID   mouth rinse  15 mL Mouth Rinse BID   melatonin  3 mg Oral QHS   memantine  10 mg Oral BID   metoprolol tartrate  12.5 mg Oral BID   sertraline  100 mg Oral QHS   sodium chloride flush  3 mL Intravenous Q12H   vitamin B-12  1,000 mcg Oral BID   zinc sulfate  220 mg Oral Daily   Continuous Infusions:  sodium chloride     heparin 650 Units/hr (08/14/21 2033)     LOS: 5 days   Time spent: 25 minutes.  Azucena Fallen, DO [facility] [email or web address] 08/15/2021, 8:19 AM

## 2021-08-15 NOTE — Progress Notes (Signed)
9191- Call from [facility] in Pharmacy regarding pt's Hep. Gtt. No change in rate and recheck labs tomorrow.  1050- Pt sitting up in bed eating breakfast. Pt is alert and oriented x4. #22g Left wrist IV with heparin drip @ 650 units per hour (6.93ml/hr). #20g in RAC SL. No complaints voiced at this time. Will monitor. Call bell within reach.  1300- Lying in bed with eyes closed. Respirations even and unlabored. Will continue to monitor.  1500- NT in with pt. NAD noted.   1745- Uneventful shift. No complaints voiced. Heparin drip remains @ 6.60ml/hr. No s/sx of bleeding. Will continue to monitor.

## 2021-08-16 LAB — CBC
HCT: 44.6 % (ref 36.0–46.0)
Hemoglobin: 14.6 g/dL (ref 12.0–15.0)
MCH: 28.7 pg (ref 26.0–34.0)
MCHC: 32.7 g/dL (ref 30.0–36.0)
MCV: 87.6 fL (ref 80.0–100.0)
Platelets: 250 10*3/uL (ref 150–400)
RBC: 5.09 MIL/uL (ref 3.87–5.11)
RDW: 13.2 % (ref 11.5–15.5)
WBC: 14.6 10*3/uL — ABNORMAL HIGH (ref 4.0–10.5)
nRBC: 0 % (ref 0.0–0.2)

## 2021-08-16 LAB — CULTURE, BLOOD (ROUTINE X 2)
Culture: NO GROWTH
Culture: NO GROWTH
Special Requests: ADEQUATE
Special Requests: ADEQUATE

## 2021-08-16 LAB — HEPARIN LEVEL (UNFRACTIONATED): Heparin Unfractionated: 0.29 IU/mL — ABNORMAL LOW (ref 0.30–0.70)

## 2021-08-16 LAB — CREATININE, SERUM
Creatinine, Ser: 1.26 mg/dL — ABNORMAL HIGH (ref 0.44–1.00)
GFR, Estimated: 41 mL/min — ABNORMAL LOW (ref >60)

## 2021-08-16 MED ORDER — SIMVASTATIN 20 MG PO TABS: 20.0000 mg | ORAL_TABLET | Freq: Every day | ORAL | 0 refills | Status: AC

## 2021-08-16 MED ORDER — APIXABAN 5 MG PO TABS
5.0000 mg | ORAL_TABLET | Freq: Two times a day (BID) | ORAL | Status: DC
  Administered 2021-08-16: 5 mg via ORAL
  Filled 2021-08-16: qty 1

## 2021-08-16 MED ORDER — APIXABAN 2.5 MG PO TABS: 2.5000 mg | ORAL_TABLET | Freq: Two times a day (BID) | ORAL | 0 refills | Status: AC

## 2021-08-16 MED ORDER — METOPROLOL TARTRATE 25 MG PO TABS: 12.5000 mg | ORAL_TABLET | Freq: Two times a day (BID) | ORAL | 0 refills | Status: AC

## 2021-08-16 NOTE — Progress Notes (Signed)
ANTICOAGULATION CONSULT NOTE - follow up  Pharmacy Consult for IV heparin Indication: atrial fibrillation  Allergies  Allergen Reactions   Sulfa Antibiotics Hives, Diarrhea, Nausea And Vomiting and Swelling    Facial swelling ALSO   Latex Rash    Patient Measurements: Height: 5\' 4"  (162.6 cm) Weight: 74.1 kg (163 lb 5.8 oz) IBW/kg (Calculated) : 54.7 Heparin Dosing Weight: 70 kg  Vital Signs: Temp: 98.1 F (36.7 C) (09/26 0459) Temp Source: Oral (09/26 0459) BP: 141/59 (09/26 0459) Pulse Rate: 63 (09/26 0459)  Labs: Recent Labs    08/14/21 0348 08/15/21 0350 08/15/21 0352 08/16/21 0339  HGB 14.1 13.8  --  14.6  HCT 42.8 41.8  --  44.6  PLT 257 246  --  250  HEPARINUNFRC 0.38  --  0.35 0.29*  CREATININE  --   --   --  1.26*    Estimated Creatinine Clearance: 31 mL/min (A) (by C-G formula based on SCr of 1.26 mg/dL (H)).  Medical History: Past Medical History:  Diagnosis Date   Allergies    Alzheimer disease (HCC)    STABLE   Anemia, iron deficiency 12/12/2011   Arthritis    RF   CKD (chronic kidney disease)    STAGE 3   Diabetes mellitus    no mes, diet only    Diarrhea    RESOLVED   Hypercholesterolemia    Hyperlipemia    Hypertension    Major depression    OA (osteoarthritis)    HAND/LEFT KNEE PAIN MILD   Osteoporosis    Syncope      Assessment: 87 yo female presenting with cough and shortness of breath, COVID positive. Found to have been in Afib with RVR while in emergency room - new diagnosis.  No anticoagulation noted PTA. Pharmacy consulted for IV heparin dosing.   Today, 08/16/21:  HL = 0.29 almost therapeutic on heparin infusion at 650 units/hr Confirmed with RN that heparin infusing at correct rate. No interruptions in infusion. No signs of bleeding.  CBC: Stable, WNL  Goal of Therapy:  Heparin level 0.3-0.7 units/ml Monitor platelets by anticoagulation protocol: Yes  Plan:  Continue heparin infusion at current rate of 650  mL/hr Will consider increasing rate depending on plans to start apixaban Daily CBC, HL while on heparin infusion. Monitor for signs of bleeding Noted plan to transition to apixaban after Paxlovid completed. Last dose of Paxlovid on 9/22 @ 1008  Per IDSA recommendation, apixaban 5 mg or 10 mg should be dose reduced by 50% until 3 days after 10/22, PharmD 08/16/21 9:35 AM

## 2021-08-16 NOTE — Progress Notes (Signed)
1015- Sitting up in bed eating small bites of food. States she is not hungry. Lungs diminished bilaterally. Heparin drip @ 6.45ml/hr. IV site asymptomatic.  1328- Heparin drip discontinued. Eliquis 5mg  po administered. Pt in nad. No complaints voiced. Pt fed a few bites of food by this nurse. Call bell within reach.  1700- IV lines discontinued with cath intact. No prolonged bleeding noted. Pt cleaned and dressed for discharge. No distress noted.  1750- Pt discharged to [facility] via [place] crew. Pt in nad during transport. BP 137/61, hr, 74, sp02 94% on RA. Report called to [facility] @ [phone].

## 2021-08-16 NOTE — Care Management Important Message (Signed)
Important Message  Patient Details IM Letter given to the Patient. Name: [name] MRN: [id number] Date of Birth: July 26, 1932   Medicare Important Message Given:  Yes     Caren Macadam 08/16/2021, 1:19 PM

## 2021-08-16 NOTE — Progress Notes (Signed)
Report given to [place], ED @ [facility].

## 2021-08-16 NOTE — TOC Progression Note (Signed)
Transition of Care [facility]) - Progression Note    Patient Details  Name: [name] MRN: [id number] Date of Birth: 09/20/1932  Transition of [facility]) CM/SW Contact  Geni Bers, RN Phone Number: 08/16/2021, 2:19 PM  Clinical Narrative:    Spoke with pt's daughter [name] this AM who asked for pt to go back to [facility]. Several calls were made to [facility] before speaking to [facility]. At 2:01 PM spoke with [place], RN who states pt may return to [facility]. [facility] was called. Also informed Ephriam Knuckles that [facility] may be late. [facility], states, ALF will still accept pt.    Expected Discharge Plan: Assisted Living Barriers to Discharge: No Barriers Identified  Expected Discharge Plan and Services Expected Discharge Plan: Assisted Living   Discharge Planning Services: CM Consult   Living arrangements for the past 2 months: Assisted Living Facility Expected Discharge Date: 08/16/21                                     Social Determinants of Health (SDOH) Interventions    Readmission Risk Interventions No flowsheet data found.

## 2021-08-16 NOTE — Discharge Summary (Signed)
Physician Discharge Summary  [name] LZJ:[id number] DOB: 01/29/1932 DOA: 08/09/2021  PCP: Lupita Raider, MD  Admit date: 08/09/2021 Discharge date: 08/16/2021  [facility] Disposition: Same  Recommendations for Outpatient Follow-up:  Follow up with PCP in 1-2 weeks Please obtain BMP/CBC in one week  Discharge Condition: Stable CODE STATUS: Full Diet recommendation: Regular diet as tolerated  Brief/Interim Summary: [name] is an 87 y.o. female with a history of Alzheimer's dementia, T2DM, HTN, HLD, and recent history of UTI diagnosed in ED discharged on keflex as well as covid-19 diagnosed 9/18 who returned from [facility] for weakness, shortness of breath, cough, possible altered mental status from baseline. She was noted to be febrile to 100.11F in new-onset AFib with RVR.    Assessment & Plan:   New onset atrial fibrillation, provoked, resolved:  - Has converted to NSR. Echo without significant abnormalities, preserved LVEF, no WMA, unremarkable valves. TSH 1.069.  -Transition to eliquis 08/16/2021 -Continue current rate control management as below   Acute hypoxic respiratory failure due to covid-19 infection, resolving:  - SARS-CoV-2 PCR initially positive on 9/18. Elevated CRP in setting of negative procalcitonin, normal WBC on admission argues for true infection. - Currently on room air - Stopped decadron - Continue airborne, contact precautions for 10 days from positive testing through 08/19/2021.   Acute metabolic encephalopathy on chronic Alzheimer's dementia, resolving:  - Appears to be approaching baseline - Continue home namenda - Delirium precautions ongoing   UTI, POA, resolving: - Does not meet sepsis criteria -Keflex completed   Generalized weakness: Likely due to infections as above.  - PT/OT to continue. Pt may be able to return to ALF with assistance/supervision.   Stage IIIb CKD: Based on current Cr values. SCr  1.17. - Avoid nephrotoxins   AST elevation: Fits with covid infection. Resolved.   T2DM: Diet-controlled. HbA1c 5.5%.  - No Tx indicated. Avoid hypoglycemia.    HTN:  - No changes at this time.   HLD:  - Hold statin for a 72h after paxlovid Tx is completed - resume 08/19/21  Discharge Instructions  Discharge Instructions     Call MD for:  temperature >100.4   Complete by: As directed    Diet - low sodium heart healthy   Complete by: As directed    Increase activity slowly   Complete by: As directed       Allergies as of 08/16/2021       Reactions   Sulfa Antibiotics Hives, Diarrhea, Nausea And Vomiting, Swelling   Facial swelling ALSO   Latex Rash        Medication List     STOP taking these medications    aspirin 81 MG EC tablet   cephALEXin 500 MG capsule Commonly known as: KEFLEX       TAKE these medications    acetaminophen 325 MG tablet Commonly known as: Tylenol Take 1 tablet (325 mg total) by mouth every 6 (six) hours as needed for moderate pain.   apixaban 2.5 MG Tabs tablet Commonly known as: ELIQUIS Take 1 tablet (2.5 mg total) by mouth 2 (two) times daily.   Calcium Carbonate-Vitamin D3 600-400 MG-UNIT Tabs Take 2 tablets by mouth every morning.   docusate sodium 100 MG capsule Commonly known as: COLACE Take 1 capsule (100 mg total) by mouth every 12 (twelve) hours. What changed:  when to take this additional instructions   melatonin 3 MG Tabs tablet Take 3 mg by mouth at  bedtime.   memantine 10 MG tablet Commonly known as: NAMENDA Take 10 mg by mouth 2 (two) times daily.   metoprolol tartrate 25 MG tablet Commonly known as: LOPRESSOR Take 0.5 tablets (12.5 mg total) by mouth 2 (two) times daily. What changed: how much to take   sertraline 100 MG tablet Commonly known as: ZOLOFT Take 100 mg by mouth at bedtime.   simvastatin 20 MG tablet Commonly known as: ZOCOR Take 1 tablet (20 mg total) by mouth daily at 6 PM. Start  taking on: August 19, 2021 What changed: These instructions start on August 19, 2021. If you are unsure what to do until then, ask your doctor or other care provider.   vitamin B-12 1000 MCG tablet Commonly known as: CYANOCOBALAMIN Take 1,000 mcg by mouth 2 (two) times daily.        Allergies  Allergen Reactions   Sulfa Antibiotics Hives, Diarrhea, Nausea And Vomiting and Swelling    Facial swelling ALSO   Latex Rash    Consultations: Cardiology Procedures/Studies: DG Chest 2 View  Result Date: 08/08/2021 CLINICAL DATA:  Decreased breath sounds on the right EXAM: CHEST - 2 VIEW COMPARISON:  Chest radiograph 03/19/2021 FINDINGS: The heart is at the upper limits of normal for size. The mediastinum is prominent, exaggerated by AP technique and rightward patient rotation. There is minimal bibasilar subsegmental atelectasis. There is no focal consolidation or pulmonary edema. There is no pleural effusion or pneumothorax. There is mild degenerative change of the thoracic spine. A catheter projecting over the right neck is favored to be external to the patient. IMPRESSION: Minimal bibasilar subsegmental atelectasis and borderline cardiomegaly. Otherwise, no radiographic evidence of acute cardiopulmonary process. Electronically Signed   By: Lesia Hausen M.D.   On: 08/08/2021 11:54   CT Head Wo Contrast  Result Date: 08/08/2021 CLINICAL DATA:  Delirium, weakness, altered mental status EXAM: CT HEAD WITHOUT CONTRAST TECHNIQUE: Contiguous axial images were obtained from the base of the skull through the vertex without intravenous contrast. COMPARISON:  Brain MRI 12/12/2014 CT head 03/19/2021 FINDINGS: Brain: There is no evidence of acute intracranial hemorrhage, extra-axial fluid collection, or infarct. There is global parenchymal volume loss with commensurate enlargement of the ventricular system, unchanged. Foci of hypodensity in the subcortical and periventricular white matter likely reflect  sequela of chronic white matter microangiopathy, also not significantly changed. 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 There is no mass lesion.  There is no midline shift. Vascular: There is calcification of the bilateral cavernous ICAs. Skull: Normal. Negative for fracture or focal lesion. Sinuses/Orbits: There is mild mucosal thickening in the paranasal sinuses. Bilateral lens implants are in place. The globes and orbits are otherwise unremarkable. Other: None. IMPRESSION: 1. No acute intracranial pathology. 2. Unchanged global parenchymal volume loss and chronic white matter microangiopathy Electronically Signed   By: Lesia Hausen M.D.   On: 08/08/2021 11:41   CT Angio Chest Pulmonary Embolism (PE) W or WO Contrast  Result Date: 08/09/2021 CLINICAL DATA:  PE suspected, low/intermediate prob, positive D-dimer Confusion.  Hypoxia.  Respiratory distress. EXAM: CT ANGIOGRAPHY CHEST WITH CONTRAST TECHNIQUE: Multidetector CT imaging of the chest was performed using the standard protocol during bolus administration of intravenous contrast. Multiplanar CT image reconstructions and MIPs were obtained to evaluate the vascular anatomy. CONTRAST:  80mL OMNIPAQUE IOHEXOL 350 MG/ML SOLN COMPARISON:  Radiograph earlier today.  Remote chest CT 12/11/2011 FINDINGS: Cardiovascular: There are no filling defects within the pulmonary arteries to suggest pulmonary embolus.  Aortic atherosclerosis and tortuosity. Can not assess for dissection given phase of contrast tailored to pulmonary artery evaluation. The heart is normal in size. There are coronary artery calcifications. No pericardial effusion. Mediastinum/Nodes: No enlarged mediastinal or hilar lymph nodes. No esophageal wall thickening. No visualized thyroid nodule. Lungs/Pleura: Breathing motion artifact partially obscures detailed assessment. There is mild central bronchial thickening with occasional areas of mucous plugging in the lower lobes.  Right greater than left lower lobe atelectasis. No confluent airspace disease or evidence of pneumonia. Basilar assessment is particularly limited due to motion. No findings of pulmonary edema. No pleural fluid. No pulmonary mass. Upper Abdomen: No acute or unexpected findings. Musculoskeletal: Slightly exaggerated upper thoracic kyphosis. There are no acute or suspicious osseous abnormalities. Remote right posterior eleventh rib fracture with callus. Review of the MIP images confirms the above findings. IMPRESSION: 1. No pulmonary embolus. 2. Mild central bronchial thickening with occasional areas of mucous plugging in the lower lobes. Right greater than left lower lobe atelectasis. 3. Aortic atherosclerosis.  Coronary artery calcifications. Aortic Atherosclerosis (ICD10-I70.0). Electronically Signed   By: Narda Rutherford M.D.   On: 08/09/2021 23:13   DG Chest Port 1 View  Result Date: 08/09/2021 CLINICAL DATA:  COVID-19 positive. EXAM: PORTABLE CHEST 1 VIEW COMPARISON:  August 08, 2021. FINDINGS: Stable cardiomediastinal silhouette. Both lungs are clear. The visualized skeletal structures are unremarkable. IMPRESSION: No active disease. Electronically Signed   By: Lupita Raider M.D.   On: 08/09/2021 12:44   ECHOCARDIOGRAM COMPLETE  Result Date: 08/11/2021    ECHOCARDIOGRAM REPORT   Patient Name:   [name] Date of Exam: 08/11/2021 Medical Rec #:  [id number]       Height:       64.0 in Accession #:    [id number]      Weight:       163.4 lb Date of Birth:  September 14, 1932       BSA:          1.795 m Patient Age:    87 years        BP:           144/66 mmHg Patient Gender: F               HR:           53 bpm. Exam Location:  Inpatient Procedure: 2D Echo, Cardiac Doppler and Color Doppler Indications:    Atrial fibrillation  History:        Patient has prior history of Echocardiogram examinations, most                 recent 01/24/2019. Arrythmias:Atrial Fibrillation,                  Signs/Symptoms:Dementia, CKD; Risk Factors:Diabetes,                 Hypertension, Dyslipidemia and Obesity. Covid+.  Sonographer:    Lavenia Atlas RDCS Referring Phys: [id number] Tyrone Nine  Sonographer Comments: Technically difficult study due to poor echo windows. IMPRESSIONS  1. Left ventricular ejection fraction, by estimation, is 60 to 65%. The left ventricle has normal function. The left ventricle has no regional wall motion abnormalities. Left ventricular diastolic parameters are consistent with Grade I diastolic dysfunction (impaired relaxation).  2. Right ventricular systolic function is normal. The right ventricular size is normal. Tricuspid regurgitation signal is inadequate for assessing PA pressure.  3. The mitral valve is grossly normal. Trivial mitral valve regurgitation. No  evidence of mitral stenosis.  4. The aortic valve is grossly normal. There is mild calcification of the aortic valve. Aortic valve regurgitation is not visualized. No aortic stenosis is present.  5. The inferior vena cava is normal in size with greater than 50% respiratory variability, suggesting right atrial pressure of 3 mmHg. FINDINGS  Left Ventricle: Left ventricular ejection fraction, by estimation, is 60 to 65%. The left ventricle has normal function. The left ventricle has no regional wall motion abnormalities. The left ventricular internal cavity size was normal in size. There is  no left ventricular hypertrophy. Left ventricular diastolic parameters are consistent with Grade I diastolic dysfunction (impaired relaxation). Right Ventricle: The right ventricular size is normal. No increase in right ventricular wall thickness. Right ventricular systolic function is normal. Tricuspid regurgitation signal is inadequate for assessing PA pressure. Left Atrium: Left atrial size was normal in size. Right Atrium: Right atrial size was normal in size. Pericardium: There is no evidence of pericardial effusion. Presence of pericardial  fat pad. Mitral Valve: The mitral valve is grossly normal. Trivial mitral valve regurgitation. No evidence of mitral valve stenosis. Tricuspid Valve: The tricuspid valve is normal in structure. Tricuspid valve regurgitation is trivial. No evidence of tricuspid stenosis. Aortic Valve: The aortic valve is grossly normal. There is mild calcification of the aortic valve. Aortic valve regurgitation is not visualized. No aortic stenosis is present. Pulmonic Valve: The pulmonic valve was not well visualized. Pulmonic valve regurgitation is not visualized. No evidence of pulmonic stenosis. Aorta: The aortic root is normal in size and structure. Venous: The inferior vena cava is normal in size with greater than 50% respiratory variability, suggesting right atrial pressure of 3 mmHg. IAS/Shunts: No atrial level shunt detected by color flow Doppler.  LEFT VENTRICLE PLAX 2D LVIDd:         4.30 cm     Diastology LVIDs:         2.80 cm     LV e' medial:    6.85 cm/s LV PW:         0.80 cm     LV E/e' medial:  9.4 LV IVS:        0.80 cm     LV e' lateral:   5.44 cm/s LVOT diam:     2.00 cm     LV E/e' lateral: 11.8 LV SV:         83 LV SV Index:   46 LVOT Area:     3.14 cm  LV Volumes (MOD) LV vol d, MOD A4C: 51.5 ml LV vol s, MOD A4C: 15.9 ml LV SV MOD A4C:     51.5 ml RIGHT VENTRICLE RV Basal diam:  2.20 cm RV S prime:     11.20 cm/s TAPSE (M-mode): 2.7 cm LEFT ATRIUM             Index       RIGHT ATRIUM           Index LA diam:        2.90 cm 1.62 cm/m  RA Area:     10.40 cm LA Vol (A2C):   42.3 ml 23.56 ml/m RA Volume:   20.90 ml  11.64 ml/m LA Vol (A4C):   20.6 ml 11.48 ml/m LA Biplane Vol: 29.8 ml 16.60 ml/m  AORTIC VALVE LVOT Vmax:   92.00 cm/s LVOT Vmean:  64.400 cm/s LVOT VTI:    0.264 m  AORTA Ao Root diam: 2.70 cm MITRAL VALVE MV Area (  PHT): 2.16 cm    SHUNTS MV Decel Time: 351 msec    Systemic VTI:  0.26 m MV E velocity: 64.30 cm/s  Systemic Diam: 2.00 cm MV A velocity: 92.10 cm/s MV E/A ratio:  0.70 Weston Brass MD Electronically signed by Weston Brass MD Signature Date/Time: 08/11/2021/6:10:29 PM    Final      Subjective: No acute issues or events overnight denies nausea vomiting diarrhea constipation headache fevers chills chest pain   Discharge Exam: Vitals:   08/16/21 0459 08/16/21 0952  BP: (!) 141/59 119/66  Pulse: 63 71  Resp: 20 16  Temp: 98.1 F (36.7 C) 98.4 F (36.9 C)  SpO2: 96%    Vitals:   08/15/21 1150 08/15/21 2126 08/16/21 0459 08/16/21 0952  BP: (!) 121/56 (!) 131/58 (!) 141/59 119/66  Pulse: (!) 58 (!) 57 63 71  Resp: Temp: 98 F (36.7 C) 98.3 F (36.8 C) 98.1 F (36.7 C) 98.4 F (36.9 C)  TempSrc: Oral Oral Oral Oral  SpO2: 95% 95% 96%   Weight:      Height:        General: Pt is alert, awake, not in acute distress Cardiovascular: RRR, S1/S2 +, no rubs, no gallops Respiratory: CTA bilaterally, no wheezing, no rhonchi Abdominal: Soft, NT, ND, bowel sounds + Extremities: no edema, no cyanosis    The results of significant diagnostics from this hospitalization (including imaging, microbiology, ancillary and laboratory) are listed below for reference.     Microbiology: Recent Results (from the past 240 hour(s))  Blood culture (routine x 2)     Status: None (Preliminary result)   Collection Time: 08/08/21 12:28 PM   Specimen: BLOOD  Result Value Ref Range Status   Specimen Description BLOOD RIGHT ANTECUBITAL  Final   Special Requests   Final    BOTTLES DRAWN AEROBIC AND ANAEROBIC Blood Culture adequate volume   Culture   Final    NO GROWTH 4 DAYS Performed at [facility], [address], [place], [place] [address]    Report Status PENDING  Incomplete  Blood culture (routine x 2)     Status: None (Preliminary result)   Collection Time: 08/08/21 12:28 PM   Specimen: BLOOD RIGHT WRIST  Result Value Ref Range Status   Specimen Description BLOOD RIGHT WRIST  Final   Special Requests   Final    BOTTLES DRAWN AEROBIC AND  ANAEROBIC Blood Culture adequate volume   Culture   Final    NO GROWTH 4 DAYS Performed at [facility], [address] [place], [place] [address]    Report Status PENDING  Incomplete  Resp Panel by RT-PCR (Flu A&B, Covid) Urine, Clean Catch     Status: Abnormal   Collection Time: 08/08/21 12:30 PM   Specimen: Urine, Clean Catch; Nasopharyngeal(NP) swabs in vial transport medium  Result Value Ref Range Status   SARS Coronavirus 2 by RT PCR POSITIVE (A) NEGATIVE Final    Comment: RESULT CALLED TO, READ BACK BY AND VERIFIED WITH: RN E BANKS [id number] AT 1415 BY CM (NOTE) SARS-CoV-2 target nucleic acids are DETECTED.  The SARS-CoV-2 RNA is generally detectable in upper respiratory specimens during the acute phase of infection. Positive results are indicative of the presence of the identified virus, but do not rule out bacterial infection or co-infection with other pathogens not detected by the test. Clinical correlation with patient history and other diagnostic information is necessary to determine patient infection  status. The expected result is Negative.  Fact Sheet for Patients: BloggerCourse.com  Fact Sheet for Healthcare Providers: SeriousBroker.it  This test is not yet approved or cleared by the [place] FDA and  has been authorized for detection and/or diagnosis of SARS-CoV-2 by FDA under an Emergency Use Authorization (EUA).  This EUA will remain in effect (meaning this test can be u sed) for the duration of  the COVID-19 declaration under Section 564(b)(1) of the Act, 21 U.S.C. section 360bbb-3(b)(1), unless the authorization is terminated or revoked sooner.     Influenza A by PCR NEGATIVE NEGATIVE Final   Influenza B by PCR NEGATIVE NEGATIVE Final    Comment: (NOTE) The Xpert Xpress SARS-CoV-2/FLU/RSV plus assay is intended as an aid in the diagnosis of influenza from Nasopharyngeal swab specimens and should  not be used as a sole basis for treatment. Nasal washings and aspirates are unacceptable for Xpert Xpress SARS-CoV-2/FLU/RSV testing.  Fact Sheet for Patients: BloggerCourse.com  Fact Sheet for Healthcare Providers: SeriousBroker.it  This test is not yet approved or cleared by the [place] FDA and has been authorized for detection and/or diagnosis of SARS-CoV-2 by FDA under an Emergency Use Authorization (EUA). This EUA will remain in effect (meaning this test can be used) for the duration of the COVID-19 declaration under Section 564(b)(1) of the Act, 21 U.S.C. section 360bbb-3(b)(1), unless the authorization is terminated or revoked.  Performed at [facility], [address], [place], [place] [address]   MRSA Next Gen by PCR, Nasal     Status: None   Collection Time: 08/10/21  1:36 AM   Specimen: Nasal Mucosa; Nasal Swab  Result Value Ref Range Status   MRSA by PCR Next Gen NOT DETECTED NOT DETECTED Final    Comment: (NOTE) The GeneXpert MRSA Assay (FDA approved for NASAL specimens only), is one component of a comprehensive MRSA colonization surveillance program. It is not intended to diagnose MRSA infection nor to guide or monitor treatment for MRSA infections. Test performance is not FDA approved in patients less than 53 years old. Performed at [facility], [address], [place], [place] [address]      Labs: BNP (last 3 results) Recent Labs    08/09/21 1053  BNP 114.3*   Basic Metabolic Panel: Recent Labs  Lab 08/10/21 0300 08/11/21 0017 08/12/21 0404 08/12/21 2307 08/13/21 0706 08/16/21 0339  NA 135 140 139 141 140  --   K 4.1 4.3 4.3 4.5 4.6  --   CL 98 105 106 105 106  --   CO2 26 25 26 27 25   --   GLUCOSE 157* 164* 155* 164* 153*  --   BUN 22 30* 30* 33* 32*  --   CREATININE 1.02* 1.24* 1.11* 1.05* 1.05* 1.26*  CALCIUM 8.8* 9.2 9.1 9.1 9.5  --    Liver Function  Tests: Recent Labs  Lab 08/10/21 0300 08/11/21 0017 08/12/21 0404 08/12/21 2307  AST 46* 42* 30 24  ALT 21 20 19 19   ALKPHOS 57 64 55 56  BILITOT 0.7 0.5 0.5 0.5  PROT 7.2 7.1 6.9 7.0  ALBUMIN 3.7 3.5 3.5 3.6   No results for input(s): LIPASE, AMYLASE in the last 168 hours. No results for input(s): AMMONIA in the last 168 hours. CBC: Recent Labs  Lab 08/10/21 0300 08/11/21 0017 08/12/21 0404 08/13/21 0706 08/14/21 0348 08/15/21 0350 08/16/21 0339  WBC 6.1 10.2 10.1 12.2* 12.5* 14.5* 14.6*  NEUTROABS 4.5 8.3* 8.2*  --   --   --   --  HGB 14.3 14.1 13.7 13.8 14.1 13.8 14.6  HCT 43.9 42.4 41.3 42.2 42.8 41.8 44.6  MCV 88.9 88.0 87.1 87.7 87.3 88.6 87.6  PLT 186 196 230 213 257 246 250   Cardiac Enzymes: No results for input(s): CKTOTAL, CKMB, CKMBINDEX, TROPONINI in the last 168 hours. BNP: Invalid input(s): POCBNP CBG: Recent Labs  Lab 08/15/21 2130  GLUCAP 113*   D-Dimer No results for input(s): DDIMER in the last 72 hours. Hgb A1c No results for input(s): HGBA1C in the last 72 hours. Lipid Profile No results for input(s): CHOL, HDL, LDLCALC, TRIG, CHOLHDL, LDLDIRECT in the last 72 hours. Thyroid function studies No results for input(s): TSH, T4TOTAL, T3FREE, THYROIDAB in the last 72 hours.  Invalid input(s): FREET3 Anemia work up No results for input(s): VITAMINB12, FOLATE, FERRITIN, TIBC, IRON, RETICCTPCT in the last 72 hours. Urinalysis    Component Value Date/Time   COLORURINE YELLOW 08/08/2021 1038   APPEARANCEUR CLEAR 08/08/2021 1038   LABSPEC 1.010 08/08/2021 1038   PHURINE 6.0 08/08/2021 1038   GLUCOSEU NEGATIVE 08/08/2021 1038   HGBUR TRACE (A) 08/08/2021 1038   BILIRUBINUR NEGATIVE 08/08/2021 1038   KETONESUR NEGATIVE 08/08/2021 1038   PROTEINUR NEGATIVE 08/08/2021 1038   UROBILINOGEN 0.2 05/13/2015 1849   NITRITE POSITIVE (A) 08/08/2021 1038   LEUKOCYTESUR SMALL (A) 08/08/2021 1038   Sepsis Labs Invalid input(s): PROCALCITONIN,  WBC,   LACTICIDVEN Microbiology Recent Results (from the past 240 hour(s))  Blood culture (routine x 2)     Status: None (Preliminary result)   Collection Time: 08/08/21 12:28 PM   Specimen: BLOOD  Result Value Ref Range Status   Specimen Description BLOOD RIGHT ANTECUBITAL  Final   Special Requests   Final    BOTTLES DRAWN AEROBIC AND ANAEROBIC Blood Culture adequate volume   Culture   Final    NO GROWTH 4 DAYS Performed at [facility], [address] [place], [place] [address]    Report Status PENDING  Incomplete  Blood culture (routine x 2)     Status: None (Preliminary result)   Collection Time: 08/08/21 12:28 PM   Specimen: BLOOD RIGHT WRIST  Result Value Ref Range Status   Specimen Description BLOOD RIGHT WRIST  Final   Special Requests   Final    BOTTLES DRAWN AEROBIC AND ANAEROBIC Blood Culture adequate volume   Culture   Final    NO GROWTH 4 DAYS Performed at [facility], [address], [place], [place] [address]    Report Status PENDING  Incomplete  Resp Panel by RT-PCR (Flu A&B, Covid) Urine, Clean Catch     Status: Abnormal   Collection Time: 08/08/21 12:30 PM   Specimen: Urine, Clean Catch; Nasopharyngeal(NP) swabs in vial transport medium  Result Value Ref Range Status   SARS Coronavirus 2 by RT PCR POSITIVE (A) NEGATIVE Final    Comment: RESULT CALLED TO, READ BACK BY AND VERIFIED WITH: RN E BANKS [id number] AT 1415 BY CM (NOTE) SARS-CoV-2 target nucleic acids are DETECTED.  The SARS-CoV-2 RNA is generally detectable in upper respiratory specimens during the acute phase of infection. Positive results are indicative of the presence of the identified virus, but do not rule out bacterial infection or co-infection with other pathogens not detected by the test. Clinical correlation with patient history and other diagnostic information is necessary to determine patient infection status. The expected result is Negative.  Fact Sheet for  Patients: BloggerCourse.com  Fact Sheet for Healthcare Providers: SeriousBroker.it  This  test is not yet approved or cleared by the [place] and  has been authorized for detection and/or diagnosis of SARS-CoV-2 by FDA under an Emergency Use Authorization (EUA).  This EUA will remain in effect (meaning this test can be u sed) for the duration of  the COVID-19 declaration under Section 564(b)(1) of the Act, 21 U.S.C. section 360bbb-3(b)(1), unless the authorization is terminated or revoked sooner.     Influenza A by PCR NEGATIVE NEGATIVE Final   Influenza B by PCR NEGATIVE NEGATIVE Final    Comment: (NOTE) The Xpert Xpress SARS-CoV-2/FLU/RSV plus assay is intended as an aid in the diagnosis of influenza from Nasopharyngeal swab specimens and should not be used as a sole basis for treatment. Nasal washings and aspirates are unacceptable for Xpert Xpress SARS-CoV-2/FLU/RSV testing.  Fact Sheet for Patients: BloggerCourse.com  Fact Sheet for Healthcare Providers: [email or web address]  This test is not yet approved or cleared by the [place] FDA and has been authorized for detection and/or diagnosis of SARS-CoV-2 by FDA under an Emergency Use Authorization (EUA). This EUA will remain in effect (meaning this test can be used) for the duration of the COVID-19 declaration under Section 564(b)(1) of the Act, 21 U.S.C. section 360bbb-3(b)(1), unless the authorization is terminated or revoked.  Performed at [facility], [address] [place], [place] [address]   MRSA Next Gen by PCR, Nasal     Status: None   Collection Time: 08/10/21  1:36 AM   Specimen: Nasal Mucosa; Nasal Swab  Result Value Ref Range Status   MRSA by PCR Next Gen NOT DETECTED NOT DETECTED Final    Comment: (NOTE) The GeneXpert MRSA Assay (FDA approved for NASAL specimens only), is one component of  a comprehensive MRSA colonization surveillance program. It is not intended to diagnose MRSA infection nor to guide or monitor treatment for MRSA infections. Test performance is not FDA approved in patients less than 11 years old. Performed at [facility], [address] [place], [place] [address]      Time coordinating discharge: Over 30 minutes  SIGNED:   Azucena Fallen, DO [facility] 08/16/2021, 11:28 AM Pager   If 7PM-7AM, please contact night-coverage [email or web address]

## 2021-08-16 NOTE — NC FL2 (Addendum)
[place] MEDICAID FL2 LEVEL OF CARE SCREENING TOOL     IDENTIFICATION  Patient Name: [name] Birthdate: 12-04-1931 Sex: female Admission Date (Current Location): 08/09/2021  [facility] and [place] Number:  Producer, television/film/video and Address:  [facility],  [address] [place], [place] [address]      Provider Number: [id number]  Attending Physician Name and Address:  Azucena Fallen, MD  Relative Name and Phone Number:  [facility] Daughter  [phone],  Sharin Grave [phone]    Current Level of Care: Hospital Recommended Level of Care: Assisted Living Facility Prior Approval Number:    Date Approved/Denied:   PASRR Number: [id number]  Discharge Plan: Other (Comment) (ALF)    Current Diagnoses: Patient Active Problem List   Diagnosis Date Noted   Acute metabolic encephalopathy 08/10/2021   COVID-19 08/09/2021   History of syncope 08/13/2019   Syncope and collapse 01/23/2019   Leukocytosis 01/23/2019   Diarrhea 01/23/2019   Prolonged QT interval 01/23/2019   Mild cognitive impairment 07/15/2015   Acute confusional state 06/19/2015   Memory loss 06/19/2015   DM II (diabetes mellitus, type II), controlled (HCC) 05/14/2015   Hyperlipidemia 05/14/2015   Borderline diabetes mellitus    Essential hypertension, benign    TIA (transient ischemic attack) 05/13/2015   Anemia, iron deficiency 12/12/2011   Humerus fracture 12/11/2011   Hypoxia 12/11/2011   Wide-complex tachycardia (HCC) 12/11/2011   Acute hypotension 12/11/2011    Orientation RESPIRATION BLADDER Height & Weight     Self, Place  Normal External catheter, Continent Weight: 74.1 kg Height:  5\' 4"  (162.6 cm)  BEHAVIORAL SYMPTOMS/MOOD NEUROLOGICAL BOWEL NUTRITION STATUS      Continent Diet (Carb modified)  AMBULATORY STATUS COMMUNICATION OF NEEDS Skin   Limited Assist Verbally Normal                       Personal Care Assistance Level of Assistance  Bathing,  Feeding, Dressing Bathing Assistance: Limited assistance Feeding assistance: Independent Dressing Assistance: Limited assistance     Functional Limitations Info  Sight, Hearing, Speech Sight Info: Adequate Hearing Info: Adequate Speech Info: Adequate    SPECIAL CARE FACTORS FREQUENCY  PT (By licensed PT), OT (By licensed OT), Blood pressure     PT Frequency: Eval and Treat OT Frequency: Eval and Treat            Contractures Contractures Info: Not present    Additional Factors Info  Code Status, Allergies Code Status Info: FULL Allergies Info: Sulfa Antibiotics, Latex           Current Medications (08/16/2021):  This is the current hospital active medication list Current Facility-Administered Medications  Medication Dose Route Frequency Provider Last Rate Last Admin   0.9 %  sodium chloride infusion  250 mL Intravenous PRN 08/18/2021, Tyrone A, DO       acetaminophen (TYLENOL) tablet 650 mg  650 mg Oral Q6H PRN Ronaldo Miyamoto, Tyrone A, DO       Or   acetaminophen (TYLENOL) suppository 650 mg  650 mg Rectal Q6H PRN Ronaldo Miyamoto, Tyrone A, DO       apixaban (ELIQUIS) tablet 5 mg  5 mg Oral BID Ronaldo Miyamoto, MD       ascorbic acid (VITAMIN C) tablet 500 mg  500 mg Oral Daily Kyle, Tyrone A, DO   500 mg at 08/16/21 0956   calcium-vitamin D (OSCAL WITH D) 500-200 MG-UNIT per tablet 2 tablet  2 tablet Oral Q  breakfast Margie Ege A, DO   2 tablet at 08/16/21 0955   chlorpheniramine-HYDROcodone (TUSSIONEX) 10-8 MG/5ML suspension 5 mL  5 mL Oral Q12H PRN Ronaldo Miyamoto, Tyrone A, DO       docusate sodium (COLACE) capsule 100 mg  100 mg Oral BID Ronaldo Miyamoto, Tyrone A, DO   100 mg at 08/16/21 0956   guaiFENesin-dextromethorphan (ROBITUSSIN DM) 100-10 MG/5ML syrup 10 mL  10 mL Oral Q4H PRN Ronaldo Miyamoto, Tyrone A, DO       Ipratropium-Albuterol (COMBIVENT) respimat 1 puff  1 puff Inhalation Q6H PRN Ronaldo Miyamoto, Tyrone A, DO       MEDLINE mouth rinse  15 mL Mouth Rinse BID Ronaldo Miyamoto, Tyrone A, DO   15 mL at 08/16/21 0956   melatonin  tablet 3 mg  3 mg Oral QHS Kyle, Tyrone A, DO   3 mg at 08/15/21 2124   memantine (NAMENDA) tablet 10 mg  10 mg Oral BID Ronaldo Miyamoto, Tyrone A, DO   10 mg at 08/16/21 0956   metoprolol tartrate (LOPRESSOR) tablet 12.5 mg  12.5 mg Oral BID Tyrone Nine, MD   12.5 mg at 08/16/21 0957   sertraline (ZOLOFT) tablet 100 mg  100 mg Oral QHS Tyrone Nine, MD   100 mg at 08/15/21 2124   sodium chloride flush (NS) 0.9 % injection 3 mL  3 mL Intravenous Q12H Kyle, Tyrone A, DO   3 mL at 08/16/21 0957   sodium chloride flush (NS) 0.9 % injection 3 mL  3 mL Intravenous PRN Ronaldo Miyamoto, Tyrone A, DO       vitamin B-12 (CYANOCOBALAMIN) tablet 1,000 mcg  1,000 mcg Oral BID Ronaldo Miyamoto, Tyrone A, DO   1,000 mcg at 08/16/21 9381   zinc sulfate capsule 220 mg  220 mg Oral Daily Kyle, Tyrone A, DO   220 mg at 08/16/21 0175     Discharge Medications: Medication List       STOP taking these medications     aspirin 81 MG EC tablet    cephALEXin 500 MG capsule Commonly known as: KEFLEX           TAKE these medications     acetaminophen 325 MG tablet Commonly known as: Tylenol Take 1 tablet (325 mg total) by mouth every 6 (six) hours as needed for moderate pain.    apixaban 2.5 MG Tabs tablet Commonly known as: ELIQUIS Take 1 tablet (2.5 mg total) by mouth 2 (two) times daily.    Calcium Carbonate-Vitamin D3 600-400 MG-UNIT Tabs Take 2 tablets by mouth every morning.    docusate sodium 100 MG capsule Commonly known as: COLACE Take 1 capsule (100 mg total) by mouth every 12 (twelve) hours. What changed:  when to take this additional instructions    melatonin 3 MG Tabs tablet Take 3 mg by mouth at bedtime.    memantine 10 MG tablet Commonly known as: NAMENDA Take 10 mg by mouth 2 (two) times daily.    metoprolol tartrate 25 MG tablet Commonly known as: LOPRESSOR Take 0.5 tablets (12.5 mg total) by mouth 2 (two) times daily. What changed: how much to take    sertraline 100 MG tablet Commonly known as:  ZOLOFT Take 100 mg by mouth at bedtime.    simvastatin 20 MG tablet Commonly known as: ZOCOR Take 1 tablet (20 mg total) by mouth daily at 6 PM. Start taking on: August 19, 2021 What changed: These instructions start on August 19, 2021. If you are unsure what to do until  then, ask your doctor or other care provider.    vitamin B-12 1000 MCG tablet Commonly known as: CYANOCOBALAMIN Take 1,000 mcg by mouth 2 (two) times daily.            Relevant Imaging Results:  Relevant Lab Results:   Additional Information SS#[id number]  Geni Bers, RN

## 2021-09-22 ENCOUNTER — Emergency Department (HOSPITAL_COMMUNITY): Payer: Medicare Other

## 2021-09-22 ENCOUNTER — Other Ambulatory Visit: Payer: Self-pay

## 2021-09-22 ENCOUNTER — Emergency Department (HOSPITAL_COMMUNITY)
Admission: EM | Admit: 2021-09-22 | Discharge: 2021-09-23 | Disposition: A | Discharge status: Home / Self Care | Payer: Medicare Other | Source: Home / Self Care | Attending: Emergency Medicine | Admitting: Emergency Medicine

## 2021-09-22 DIAGNOSIS — S0003XA Contusion of scalp, initial encounter: Secondary | ICD-10-CM | POA: Insufficient documentation

## 2021-09-22 DIAGNOSIS — Z7901 Long term (current) use of anticoagulants: Secondary | ICD-10-CM | POA: Insufficient documentation

## 2021-09-22 DIAGNOSIS — W01198A Fall on same level from slipping, tripping and stumbling with subsequent striking against other object, initial encounter: Secondary | ICD-10-CM | POA: Insufficient documentation

## 2021-09-22 DIAGNOSIS — Z79899 Other long term (current) drug therapy: Secondary | ICD-10-CM | POA: Insufficient documentation

## 2021-09-22 DIAGNOSIS — F0390 Unspecified dementia without behavioral disturbance: Secondary | ICD-10-CM | POA: Insufficient documentation

## 2021-09-22 DIAGNOSIS — M80051A Age-related osteoporosis with current pathological fracture, right femur, initial encounter for fracture: Secondary | ICD-10-CM | POA: Diagnosis not present

## 2021-09-22 DIAGNOSIS — N183 Chronic kidney disease, stage 3 unspecified: Secondary | ICD-10-CM | POA: Insufficient documentation

## 2021-09-22 DIAGNOSIS — Z87891 Personal history of nicotine dependence: Secondary | ICD-10-CM | POA: Insufficient documentation

## 2021-09-22 DIAGNOSIS — Z8616 Personal history of COVID-19: Secondary | ICD-10-CM | POA: Insufficient documentation

## 2021-09-22 DIAGNOSIS — R55 Syncope and collapse: Secondary | ICD-10-CM | POA: Insufficient documentation

## 2021-09-22 DIAGNOSIS — Z7982 Long term (current) use of aspirin: Secondary | ICD-10-CM | POA: Insufficient documentation

## 2021-09-22 DIAGNOSIS — I129 Hypertensive chronic kidney disease with stage 1 through stage 4 chronic kidney disease, or unspecified chronic kidney disease: Secondary | ICD-10-CM | POA: Insufficient documentation

## 2021-09-22 DIAGNOSIS — E1122 Type 2 diabetes mellitus with diabetic chronic kidney disease: Secondary | ICD-10-CM | POA: Insufficient documentation

## 2021-09-22 LAB — CBC WITH DIFFERENTIAL/PLATELET
Abs Immature Granulocytes: 0.06 10*3/uL (ref 0.00–0.07)
Basophils Absolute: 0 10*3/uL (ref 0.0–0.1)
Basophils Relative: 0 %
Eosinophils Absolute: 0.2 10*3/uL (ref 0.0–0.5)
Eosinophils Relative: 2 %
HCT: 40.1 % (ref 36.0–46.0)
Hemoglobin: 12.7 g/dL (ref 12.0–15.0)
Immature Granulocytes: 1 %
Lymphocytes Relative: 19 %
Lymphs Abs: 1.8 10*3/uL (ref 0.7–4.0)
MCH: 28.8 pg (ref 26.0–34.0)
MCHC: 31.7 g/dL (ref 30.0–36.0)
MCV: 90.9 fL (ref 80.0–100.0)
Monocytes Absolute: 0.8 10*3/uL (ref 0.1–1.0)
Monocytes Relative: 8 %
Neutro Abs: 6.5 10*3/uL (ref 1.7–7.7)
Neutrophils Relative %: 70 %
Platelets: 230 10*3/uL (ref 150–400)
RBC: 4.41 MIL/uL (ref 3.87–5.11)
RDW: 13.6 % (ref 11.5–15.5)
WBC: 9.4 10*3/uL (ref 4.0–10.5)
nRBC: 0 % (ref 0.0–0.2)

## 2021-09-22 LAB — CBG MONITORING, ED: Glucose-Capillary: 116 mg/dL — ABNORMAL HIGH (ref 70–99)

## 2021-09-22 LAB — BASIC METABOLIC PANEL
Anion gap: 10 (ref 5–15)
BUN: 12 mg/dL (ref 8–23)
CO2: 29 mmol/L (ref 22–32)
Calcium: 8.9 mg/dL (ref 8.9–10.3)
Chloride: 102 mmol/L (ref 98–111)
Creatinine, Ser: 1.33 mg/dL — ABNORMAL HIGH (ref 0.44–1.00)
GFR, Estimated: 39 mL/min — ABNORMAL LOW (ref >60)
Glucose, Bld: 121 mg/dL — ABNORMAL HIGH (ref 70–99)
Potassium: 3.6 mmol/L (ref 3.5–5.1)
Sodium: 141 mmol/L (ref 135–145)

## 2021-09-22 MED ORDER — SODIUM CHLORIDE 0.9 % IV BOLUS
500.0000 mL | Freq: Once | INTRAVENOUS | Status: AC
  Administered 2021-09-22: 500 mL via INTRAVENOUS

## 2021-09-22 NOTE — ED Notes (Signed)
Report given to Velna Hatchet at [facility].  PT will need to return via [facility].  Secretary is calling.

## 2021-09-22 NOTE — ED Triage Notes (Signed)
Patient presents to ED via [facility] due to a fall. EMS reported that patient fell backward on carpeted floor and hit back of her head. EMS reported patient was unconscious about 2 minutes. Patient alerts to the ED with hard of hearing. Patient reports that she is not on blood thinner. EMS reported Vitals BP: 134/60, RR: 22, P:70, SpO2: 98%, CBG:116

## 2021-09-22 NOTE — ED Provider Notes (Signed)
[facility] EMERGENCY DEPARTMENT Provider Note   CSN: [id number] Arrival date & time: 09/22/21  1328  LEVEL 5 CAVEAT - DEMENTIA   History Chief Complaint  Patient presents with   Fall    Patient states she fell on the carpeted floor backward and hit back of her head on carpeted floor    [name] is a 87 y.o. female.  HPI 87 year old female presents after a fall.  She fell backwards and struck her head.  History is initially from EMS as the patient has dementia.  No other known injuries. When I talked to staff at [facility], they note that she's had a gradual decline since her admission in September. Nothing specific. No current/recent illness such as fever, cough. Staff states that the fall was witnessed by other residents (unclear how reliable they are). They told staff she passed and and fell down, striking her head. Was out for 1 minute. Used to be on eliquis but is now on aspirin.   Past Medical History:  Diagnosis Date   Allergies    Alzheimer disease (HCC)    STABLE   Anemia, iron deficiency 12/12/2011   Arthritis    RF   CKD (chronic kidney disease)    STAGE 3   Diabetes mellitus    no mes, diet only    Diarrhea    RESOLVED   Hypercholesterolemia    Hyperlipemia    Hypertension    Major depression    OA (osteoarthritis)    HAND/LEFT KNEE PAIN MILD   Osteoporosis    Syncope     Patient Active Problem List   Diagnosis Date Noted   Acute metabolic encephalopathy 08/10/2021   COVID-19 08/09/2021   History of syncope 08/13/2019   Syncope and collapse 01/23/2019   Leukocytosis 01/23/2019   Diarrhea 01/23/2019   Prolonged QT interval 01/23/2019   Mild cognitive impairment 07/15/2015   Acute confusional state 06/19/2015   Memory loss 06/19/2015   DM II (diabetes mellitus, type II), controlled (HCC) 05/14/2015   Hyperlipidemia 05/14/2015   Borderline diabetes mellitus    Essential hypertension, benign    TIA (transient ischemic  attack) 05/13/2015   Anemia, iron deficiency 12/12/2011   Humerus fracture 12/11/2011   Hypoxia 12/11/2011   Wide-complex tachycardia 12/11/2011   Acute hypotension 12/11/2011    Past Surgical History:  Procedure Laterality Date   left knee arthroscopy     ORIF HUMERUS FRACTURE  12/09/2011   Procedure: OPEN REDUCTION INTERNAL FIXATION (ORIF) DISTAL HUMERUS FRACTURE;  Surgeon: Sharma Covert, MD;  Location: [facility];  Service: Orthopedics;  Laterality: Right;   OTHER SURGICAL HISTORY     left small toe bone spur removed    TONSILLECTOMY       OB History   No obstetric history on file.     Family History  Problem Relation Age of Onset   Heart Problems Mother    Heart attack Maternal Grandmother        died from a "heart attack"   Stroke Neg Hx    Arrhythmia Neg Hx     Social History   Tobacco Use   Smoking status: Former    Types: Cigarettes    Quit date: 12/29/2009    Years since quitting: 11.7   Smokeless tobacco: Never  Vaping Use   Vaping Use: Never used  Substance Use Topics   Alcohol use: No    Alcohol/week: 0.0 standard drinks   Drug use: No  Home Medications Prior to Admission medications   Medication Sig Start Date End Date Taking? Authorizing Provider  acetaminophen (TYLENOL) 325 MG tablet Take 1 tablet (325 mg total) by mouth every 6 (six) hours as needed for moderate pain. 10/10/20   Placido Sou, PA-C  apixaban (ELIQUIS) 2.5 MG TABS tablet Take 1 tablet (2.5 mg total) by mouth 2 (two) times daily. 08/16/21   Azucena Fallen, MD  Calcium Carbonate-Vitamin D3 600-400 MG-UNIT TABS Take 2 tablets by mouth every morning.    [provider]  docusate sodium (COLACE) 100 MG capsule Take 1 capsule (100 mg total) by mouth every 12 (twelve) hours. Patient taking differently: Take 100 mg by mouth See admin instructions. 100mg  every 12 hours  AND 100mg  daily as needed for constipation 12/27/16   Ward, , DO  Melatonin 3 MG TABS Take 3 mg by  mouth at bedtime.    [provider]  memantine (NAMENDA) 10 MG tablet Take 10 mg by mouth 2 (two) times daily.    [provider]  metoprolol tartrate (LOPRESSOR) 25 MG tablet Take 0.5 tablets (12.5 mg total) by mouth 2 (two) times daily. 08/16/21   Layla Maw, MD  sertraline (ZOLOFT) 100 MG tablet Take 100 mg by mouth at bedtime.    [provider]  simvastatin (ZOCOR) 20 MG tablet Take 1 tablet (20 mg total) by mouth daily at 6 PM. 08/19/21   Azucena Fallen, MD  vitamin B-12 (CYANOCOBALAMIN) 1000 MCG tablet Take 1,000 mcg by mouth 2 (two) times daily.    [provider]    Allergies    Sulfa antibiotics and Latex  Review of Systems   Review of Systems  Unable to perform ROS: Dementia   Physical Exam Updated Vital Signs BP (!) 117/55   Pulse 68   Temp 98 F (36.7 C) (Oral)   Resp 13   SpO2 96%   Physical Exam Vitals and nursing note reviewed.  Constitutional:      Appearance: She is well-developed.  HENT:     Head: Normocephalic. Contusion present.      Right Ear: External ear normal.     Left Ear: External ear normal.     Nose: Nose normal.  Eyes:     General:        Right eye: No discharge.        Left eye: No discharge.     Pupils: Pupils are equal, round, and reactive to light.  Cardiovascular:     Rate and Rhythm: Normal rate and regular rhythm.     Heart sounds: Normal heart sounds.  Pulmonary:     Effort: Pulmonary effort is normal.     Breath sounds: Normal breath sounds.  Abdominal:     General: There is no distension.     Palpations: Abdomen is soft.     Tenderness: There is no abdominal tenderness.  Musculoskeletal:     Cervical back: No spinous process tenderness or muscular tenderness.  Skin:    General: Skin is warm and dry.  Neurological:     Mental Status: She is alert.     Comments: Awake, alert, but disoriented.  Equal strength in all 4 extremities  Psychiatric:        Mood and Affect: Mood  is not anxious.    ED Results / Procedures / Treatments   Labs (all labs ordered are listed, but only abnormal results are displayed) Labs Reviewed  BASIC METABOLIC PANEL - Abnormal;  Notable for the following components:      Result Value   Glucose, Bld 121 (*)    Creatinine, Ser 1.33 (*)    GFR, Estimated 39 (*)    All other components within normal limits  CBG MONITORING, ED - Abnormal; Notable for the following components:   Glucose-Capillary 116 (*)    All other components within normal limits  CBC WITH DIFFERENTIAL/PLATELET    EKG EKG Interpretation  Date/Time:  Wednesday September 22 2021 13:44:52 EDT Ventricular Rate:  67 PR Interval:  173 QRS Duration: 102 QT Interval:  406 QTC Calculation: 429 R Axis:   43 Text Interpretation: Sinus rhythm Low voltage, precordial leads Borderline T abnormalities, anterior leads Baseline wander in lead(s) V3 afib no longer present Confirmed by Sherwood Gambler [phone]) on 09/22/2021 1:49:14 PM  Radiology DG Chest 2 View  Result Date: 09/22/2021 CLINICAL DATA:  Fall with loss of consciousness. EXAM: CHEST - 2 VIEW COMPARISON:  Chest x-ray dated August 09, 2021. FINDINGS: The heart size and mediastinal contours are within normal limits. Both lungs are clear. The visualized skeletal structures are unremarkable. IMPRESSION: No active cardiopulmonary disease. Electronically Signed   By: Titus Dubin M.D.   On: 09/22/2021 14:56   CT HEAD WO CONTRAST (5MM)  Result Date: 09/22/2021 CLINICAL DATA:  Fall, hit head, loss of consciousness EXAM: CT HEAD WITHOUT CONTRAST CT CERVICAL SPINE WITHOUT CONTRAST TECHNIQUE: Multidetector CT imaging of the head and cervical spine was performed following the standard protocol without intravenous contrast. Multiplanar CT image reconstructions of the cervical spine were also generated. COMPARISON:  03/19/2021 FINDINGS: CT HEAD FINDINGS Brain: No evidence of acute infarction, hemorrhage, hydrocephalus,  extra-axial collection or mass lesion/mass effect. Periventricular and deep white matter hypodensity. Mild global cerebral volume loss. Vascular: No hyperdense vessel or unexpected calcification. Skull: Normal. Negative for fracture or focal lesion. Sinuses/Orbits: No acute finding. Other: Soft tissue contusion of the occiput (series 3, image 18). CT CERVICAL SPINE FINDINGS Alignment: Normal. Skull base and vertebrae: No acute fracture. No primary bone lesion or focal pathologic process. Soft tissues and spinal canal: No prevertebral fluid or swelling. No visible canal hematoma. Disc levels: Mild-to-moderate multilevel disc space height loss and osteophytosis, worst at the lower cervical levels. Upper chest: Negative. Other: None. IMPRESSION: 1. No acute intracranial pathology. Small-vessel white matter disease and cerebral volume loss. 2. Soft tissue contusion of the occiput. 3. No fracture or static subluxation of the cervical spine. 4. Mild-to-moderate multilevel disc space height loss and osteophytosis, worst at the lower cervical levels. Electronically Signed   By: Delanna Ahmadi M.D.   On: 09/22/2021 14:06   CT Cervical Spine Wo Contrast  Result Date: 09/22/2021 CLINICAL DATA:  Fall, hit head, loss of consciousness EXAM: CT HEAD WITHOUT CONTRAST CT CERVICAL SPINE WITHOUT CONTRAST TECHNIQUE: Multidetector CT imaging of the head and cervical spine was performed following the standard protocol without intravenous contrast. Multiplanar CT image reconstructions of the cervical spine were also generated. COMPARISON:  03/19/2021 FINDINGS: CT HEAD FINDINGS Brain: No evidence of acute infarction, hemorrhage, hydrocephalus, extra-axial collection or mass lesion/mass effect. Periventricular and deep white matter hypodensity. Mild global cerebral volume loss. Vascular: No hyperdense vessel or unexpected calcification. Skull: Normal. Negative for fracture or focal lesion. Sinuses/Orbits: No acute finding. Other: Soft  tissue contusion of the occiput (series 3, image 18). CT CERVICAL SPINE FINDINGS Alignment: Normal. Skull base and vertebrae: No acute fracture. No primary bone lesion or focal pathologic process. Soft tissues and spinal canal: No prevertebral  fluid or swelling. No visible canal hematoma. Disc levels: Mild-to-moderate multilevel disc space height loss and osteophytosis, worst at the lower cervical levels. Upper chest: Negative. Other: None. IMPRESSION: 1. No acute intracranial pathology. Small-vessel white matter disease and cerebral volume loss. 2. Soft tissue contusion of the occiput. 3. No fracture or static subluxation of the cervical spine. 4. Mild-to-moderate multilevel disc space height loss and osteophytosis, worst at the lower cervical levels. Electronically Signed   By: Delanna Ahmadi M.D.   On: 09/22/2021 14:06    Procedures Procedures   Medications Ordered in ED Medications  sodium chloride 0.9 % bolus 500 mL (0 mLs Intravenous Stopped 09/22/21 1550)    ED Course  I have reviewed the triage vital signs and the nursing notes.  Pertinent labs & imaging results that were available during my care of the patient were reviewed by me and considered in my medical decision making (see chart for details).    MDM Rules/Calculators/A&P                           Patient presents with possible syncope but definitely fall and scalp hematoma.  She is well-appearing now.  Vital signs are stable.  She has chronic dementia that is unchanged.  Work-up is pretty unremarkable besides the hematoma itself but the head CT imaging and labs are at baseline.  It is unclear if she fell and then passed out after hitting her head versus syncope first.  History is from unreliable source in the word [place].  Discussed with her daughter, Huston Foley, who after discussion indicates she would not want any type of aggressive work-up or treatment including no sort of syncope work-up at this time.  Would like to send her  back to the facility.  I think this is pretty reasonable.  No hip pain or decreased range of motion.  Will discharge to facility with return precautions. Final Clinical Impression(s) / ED Diagnoses Final diagnoses:  Hematoma of scalp, initial encounter  Syncope and collapse    Rx / DC Orders ED Discharge Orders     None        Sherwood Gambler, MD 09/22/21 1554

## 2021-09-22 NOTE — ED Notes (Signed)
Pt messing with o2 probe ,

## 2021-09-22 NOTE — ED Notes (Signed)
[facility] called for transport.  

## 2021-09-24 ENCOUNTER — Emergency Department (HOSPITAL_COMMUNITY)
Admission: EM | Admit: 2021-09-24 | Discharge: 2021-09-25 | Disposition: A | Discharge status: Home / Self Care | Payer: Medicare Other | Source: Home / Self Care | Attending: Emergency Medicine | Admitting: Emergency Medicine

## 2021-09-24 ENCOUNTER — Emergency Department (HOSPITAL_COMMUNITY): Payer: Medicare Other

## 2021-09-24 ENCOUNTER — Other Ambulatory Visit: Payer: Self-pay

## 2021-09-24 DIAGNOSIS — Z87891 Personal history of nicotine dependence: Secondary | ICD-10-CM | POA: Insufficient documentation

## 2021-09-24 DIAGNOSIS — I129 Hypertensive chronic kidney disease with stage 1 through stage 4 chronic kidney disease, or unspecified chronic kidney disease: Secondary | ICD-10-CM | POA: Insufficient documentation

## 2021-09-24 DIAGNOSIS — N3001 Acute cystitis with hematuria: Secondary | ICD-10-CM

## 2021-09-24 DIAGNOSIS — W19XXXA Unspecified fall, initial encounter: Secondary | ICD-10-CM | POA: Insufficient documentation

## 2021-09-24 DIAGNOSIS — Z7901 Long term (current) use of anticoagulants: Secondary | ICD-10-CM | POA: Insufficient documentation

## 2021-09-24 DIAGNOSIS — E1122 Type 2 diabetes mellitus with diabetic chronic kidney disease: Secondary | ICD-10-CM | POA: Insufficient documentation

## 2021-09-24 DIAGNOSIS — Z8616 Personal history of COVID-19: Secondary | ICD-10-CM | POA: Insufficient documentation

## 2021-09-24 DIAGNOSIS — Z91040 Latex allergy status: Secondary | ICD-10-CM | POA: Insufficient documentation

## 2021-09-24 DIAGNOSIS — G309 Alzheimer's disease, unspecified: Secondary | ICD-10-CM | POA: Insufficient documentation

## 2021-09-24 DIAGNOSIS — N183 Chronic kidney disease, stage 3 unspecified: Secondary | ICD-10-CM | POA: Insufficient documentation

## 2021-09-24 DIAGNOSIS — S62521A Displaced fracture of distal phalanx of right thumb, initial encounter for closed fracture: Secondary | ICD-10-CM

## 2021-09-24 LAB — COMPREHENSIVE METABOLIC PANEL
ALT: 12 U/L (ref 0–44)
AST: 21 U/L (ref 15–41)
Albumin: 3.3 g/dL — ABNORMAL LOW (ref 3.5–5.0)
Alkaline Phosphatase: 57 U/L (ref 38–126)
Anion gap: 8 (ref 5–15)
BUN: 9 mg/dL (ref 8–23)
CO2: 27 mmol/L (ref 22–32)
Calcium: 8.9 mg/dL (ref 8.9–10.3)
Chloride: 102 mmol/L (ref 98–111)
Creatinine, Ser: 1.01 mg/dL — ABNORMAL HIGH (ref 0.44–1.00)
GFR, Estimated: 54 mL/min — ABNORMAL LOW (ref >60)
Glucose, Bld: 128 mg/dL — ABNORMAL HIGH (ref 70–99)
Potassium: 3.9 mmol/L (ref 3.5–5.1)
Sodium: 137 mmol/L (ref 135–145)
Total Bilirubin: 0.8 mg/dL (ref 0.3–1.2)
Total Protein: 6.3 g/dL — ABNORMAL LOW (ref 6.5–8.1)

## 2021-09-24 LAB — CBC WITH DIFFERENTIAL/PLATELET
Abs Immature Granulocytes: 0.03 10*3/uL (ref 0.00–0.07)
Basophils Absolute: 0 10*3/uL (ref 0.0–0.1)
Basophils Relative: 0 %
Eosinophils Absolute: 0.2 10*3/uL (ref 0.0–0.5)
Eosinophils Relative: 2 %
HCT: 41.4 % (ref 36.0–46.0)
Hemoglobin: 12.9 g/dL (ref 12.0–15.0)
Immature Granulocytes: 0 %
Lymphocytes Relative: 18 %
Lymphs Abs: 1.6 10*3/uL (ref 0.7–4.0)
MCH: 28.5 pg (ref 26.0–34.0)
MCHC: 31.2 g/dL (ref 30.0–36.0)
MCV: 91.6 fL (ref 80.0–100.0)
Monocytes Absolute: 0.7 10*3/uL (ref 0.1–1.0)
Monocytes Relative: 8 %
Neutro Abs: 6.6 10*3/uL (ref 1.7–7.7)
Neutrophils Relative %: 72 %
Platelets: 204 10*3/uL (ref 150–400)
RBC: 4.52 MIL/uL (ref 3.87–5.11)
RDW: 13.7 % (ref 11.5–15.5)
WBC: 9.2 10*3/uL (ref 4.0–10.5)
nRBC: 0 % (ref 0.0–0.2)

## 2021-09-24 LAB — URINALYSIS, ROUTINE W REFLEX MICROSCOPIC
Bilirubin Urine: NEGATIVE
Glucose, UA: NEGATIVE mg/dL
Ketones, ur: NEGATIVE mg/dL
Nitrite: NEGATIVE
Protein, ur: NEGATIVE mg/dL
Specific Gravity, Urine: 1.014 (ref 1.005–1.030)
WBC, UA: >50 WBC/hpf — ABNORMAL HIGH (ref 0–5)
pH: 5 (ref 5.0–8.0)

## 2021-09-24 LAB — TROPONIN I (HIGH SENSITIVITY)
Troponin I (High Sensitivity): 8 ng/L (ref <18)
Troponin I (High Sensitivity): 10 ng/L (ref <18)

## 2021-09-24 LAB — CK: Total CK: 213 U/L (ref 38–234)

## 2021-09-24 MED ORDER — SODIUM CHLORIDE 0.9 % IV SOLN
1.0000 g | Freq: Once | INTRAVENOUS | Status: AC
  Administered 2021-09-24: 1 g via INTRAVENOUS
  Filled 2021-09-24: qty 10

## 2021-09-24 MED ORDER — CEPHALEXIN 500 MG PO CAPS: 500.0000 mg | ORAL_CAPSULE | Freq: Four times a day (QID) | ORAL | 0 refills | Status: DC

## 2021-09-24 NOTE — ED Notes (Signed)
Ortho paged. 

## 2021-09-24 NOTE — ED Provider Notes (Signed)
[facility] EMERGENCY DEPARTMENT Provider Note   CSN: [id number] Arrival date & time: 09/24/21  [id number]    History Chief Complaint  Patient presents with   [name] is a 87 y.o. female with medical history significant for CKD, diabetes, Alzheimer's who presents for evaluation of fall versus syncope.  Level 5 Caveat- Dementia  Per EMS patient resident at [facility].  On morning rounds they found patient on the floor.  Unknown how long down.  She has had a gradual decline since September.  Diagnosed with COVID 2 months ago.  No recent sick contacts or illnesses.  Was actually seen here in ED 2 days ago for possible fall versus syncope.  At that time attending had discussed with daughter who stated patient would not want admission or further work-up for syncope at that time.  Patient is on baby aspirin daily however no additional anticoagulation.  1010: Collateral from daughter Noni Saupe via phone at [phone]  Agrees with Labs and imaging for now and will discuss further possible workup dispo once labs, imaging resulted  HPI     Past Medical History:  Diagnosis Date   Allergies    Alzheimer disease ([place])    STABLE   Anemia, iron deficiency 12/12/2011   Arthritis    RF   CKD (chronic kidney disease)    STAGE 3   Diabetes mellitus    no mes, diet only    Diarrhea    RESOLVED   Hypercholesterolemia    Hyperlipemia    Hypertension    Major depression    OA (osteoarthritis)    HAND/LEFT KNEE PAIN MILD   Osteoporosis    Syncope     Patient Active Problem List   Diagnosis Date Noted   Acute metabolic encephalopathy [id number]   COVID-19 08/09/2021   History of syncope 08/13/2019   Syncope and collapse 01/23/2019   Leukocytosis 01/23/2019   Diarrhea 01/23/2019   Prolonged QT interval 01/23/2019   Mild cognitive impairment 07/15/2015   Acute confusional state 06/19/2015   Memory loss 06/19/2015   DM II (diabetes mellitus, type  II), controlled ([place]) 05/14/2015   Hyperlipidemia 05/14/2015   Borderline diabetes mellitus    Essential hypertension, benign    TIA (transient ischemic attack) 05/13/2015   Anemia, iron deficiency 12/12/2011   Humerus fracture 12/11/2011   Hypoxia 12/11/2011   Wide-complex tachycardia 12/11/2011   Acute hypotension 12/11/2011    Past Surgical History:  Procedure Laterality Date   left knee arthroscopy     ORIF HUMERUS FRACTURE  12/09/2011   Procedure: OPEN REDUCTION INTERNAL FIXATION (ORIF) DISTAL HUMERUS FRACTURE;  Surgeon: Linna Hoff, MD;  Location: [facility];  Service: Orthopedics;  Laterality: Right;   OTHER SURGICAL HISTORY     left small toe bone spur removed    TONSILLECTOMY       OB History   No obstetric history on file.     Family History  Problem Relation Age of Onset   Heart Problems Mother    Heart attack Maternal Grandmother        died from a "heart attack"   Stroke Neg Hx    Arrhythmia Neg Hx     Social History   Tobacco Use   Smoking status: Former    Types: Cigarettes    Quit date: 12/29/2009    Years since quitting: 11.7   Smokeless tobacco: Never  Vaping Use   Vaping Use: Never used  Substance  Use Topics   Alcohol use: No    Alcohol/week: 0.0 standard drinks   Drug use: No    Home Medications Prior to Admission medications   Medication Sig Start Date End Date Taking? Authorizing Provider  acetaminophen (TYLENOL) 325 MG tablet Take 1 tablet (325 mg total) by mouth every 6 (six) hours as needed for moderate pain. 10/10/20   Placido Sou, PA-C  apixaban (ELIQUIS) 2.5 MG TABS tablet Take 1 tablet (2.5 mg total) by mouth 2 (two) times daily. 08/16/21   Azucena Fallen, MD  Calcium Carbonate-Vitamin D3 600-400 MG-UNIT TABS Take 2 tablets by mouth every morning.    [provider]  docusate sodium (COLACE) 100 MG capsule Take 1 capsule (100 mg total) by mouth every 12 (twelve) hours. Patient taking differently: Take 100 mg by  mouth See admin instructions. 100mg  every 12 hours  AND 100mg  daily as needed for constipation 12/27/16   Ward, , DO  Melatonin 3 MG TABS Take 3 mg by mouth at bedtime.    [provider]  memantine (NAMENDA) 10 MG tablet Take 10 mg by mouth 2 (two) times daily.    [provider]  metoprolol tartrate (LOPRESSOR) 25 MG tablet Take 0.5 tablets (12.5 mg total) by mouth 2 (two) times daily. 08/16/21   Layla Maw, MD  sertraline (ZOLOFT) 100 MG tablet Take 100 mg by mouth at bedtime.    [provider]  simvastatin (ZOCOR) 20 MG tablet Take 1 tablet (20 mg total) by mouth daily at 6 PM. 08/19/21   Azucena Fallen, MD  vitamin B-12 (CYANOCOBALAMIN) 1000 MCG tablet Take 1,000 mcg by mouth 2 (two) times daily.    [provider]    Allergies    Sulfa antibiotics and Latex  Review of Systems   Review of Systems  Unable to perform ROS: Dementia  Constitutional: Negative.   HENT: Negative.    Respiratory: Negative.    Cardiovascular: Negative.   Gastrointestinal: Negative.   Genitourinary: Negative.   Musculoskeletal:        Right thumb injury  All other systems reviewed and are negative.  Physical Exam Updated Vital Signs BP (!) 127/59   Pulse (!) 59   Temp 97.8 F (36.6 C) (Oral)   Resp 16   Ht 5\' 4"  (1.626 m)   Wt 74 kg   SpO2 94%   BMI 28.00 kg/m   Physical Exam Vitals and nursing note reviewed.  Constitutional:      General: She is not in acute distress.    Appearance: She is well-developed. She is not ill-appearing, toxic-appearing or diaphoretic.     Comments: Pleasantly confused  HENT:     Head: Normocephalic.     Comments: Small scalp hematoma to posterior occipital region. No obvious laceration    Nose: Nose normal. No congestion or rhinorrhea.     Mouth/Throat:     Mouth: Mucous membranes are moist.  Eyes:     Pupils: Pupils are equal, round, and reactive to light.  Cardiovascular:     Rate and Rhythm:  Normal rate.     Pulses: Normal pulses.          Radial pulses are 2+ on the right side and 2+ on the left side.       Dorsalis pedis pulses are 2+ on the right side and 2+ on the left side.     Heart sounds: Normal heart sounds.  Pulmonary:     Effort: Pulmonary  effort is normal. No respiratory distress.     Breath sounds: Normal breath sounds and air entry.     Comments: Clear Bl, speaks in full sentences without difficulty Chest:     Comments: Old yellow ecchymosis to posterior right ribs without overlying tenderness, crepitus, step off Abdominal:     General: Bowel sounds are normal. There is no distension.     Tenderness: There is no abdominal tenderness. There is no right CVA tenderness, left CVA tenderness, guarding or rebound.     Comments: Soft non tender without rebound or guarding  Musculoskeletal:        General: Normal range of motion.     Right shoulder: Normal.     Left shoulder: Normal.     Right upper arm: Normal.     Left upper arm: Normal.     Right elbow: Normal.     Left elbow: Normal.     Right forearm: Normal.     Left forearm: Normal.     Right wrist: Normal.     Left wrist: Normal.     Right hand: Swelling and tenderness (distal right thumb) present.     Left hand: Normal.     Cervical back: Normal and normal range of motion.     Lumbar back: Normal.     Right hip: Normal.     Left hip: Normal.     Right knee: Normal.     Left knee: Normal.     Right lower leg: Normal.     Left lower leg: Normal.     Right ankle: No swelling.     Left ankle: Normal.     Right foot: Normal.     Left foot: Normal.     Comments: No midline C/T/L tenderness. Moves all 4 extremities without difficulty. No shortening or rotation of legs. No tenderness to BL upper or lower extremities. Lifts Bl legs off bed. Can flex and extend at all major joints. Echymosis and tenderness to right distal thumb. Non tender scaphoid, wrist Bl.  Skin:    General: Skin is warm and dry.      Capillary Refill: Capillary refill takes less than 2 seconds.     Comments: Ecchymosis right distal thumb No lacerations abrasions  Neurological:     General: No focal deficit present.     Mental Status: She is alert. Mental status is at baseline.     Comments: Confused, alert to person, DOB, location (hospital) not time CN 2-12 grossly intact Follow commands Equal strength bil  Psychiatric:        Mood and Affect: Mood normal.    ED Results / Procedures / Treatments   Labs (all labs ordered are listed, but only abnormal results are displayed) Labs Reviewed  COMPREHENSIVE METABOLIC PANEL - Abnormal; Notable for the following components:      Result Value   Glucose, Bld 128 (*)    Creatinine, Ser 1.01 (*)    Total Protein 6.3 (*)    Albumin 3.3 (*)    GFR, Estimated 54 (*)    All other components within normal limits  CBC WITH DIFFERENTIAL/PLATELET  CK  URINALYSIS, ROUTINE W REFLEX MICROSCOPIC  TROPONIN I (HIGH SENSITIVITY)  TROPONIN I (HIGH SENSITIVITY)    EKG None  Radiology DG Chest 2 View  Result Date: 09/24/2021 CLINICAL DATA:  87 year old female with history of trauma from a fall today. Altered mental status. EXAM: CHEST - 2 VIEW COMPARISON:  Chest x-ray 09/22/2021. FINDINGS: Lung volumes  are normal. No consolidative airspace disease. No pleural effusions. No pneumothorax. No pulmonary nodule or mass noted. Pulmonary vasculature and the cardiomediastinal silhouette are within normal limits. Atherosclerosis in the thoracic aorta. IMPRESSION: 1.  No radiographic evidence of acute cardiopulmonary disease. 2. Aortic atherosclerosis. Electronically Signed   By: Vinnie Langton M.D.   On: 09/24/2021 10:40   DG Chest 2 View  Result Date: 09/22/2021 CLINICAL DATA:  Fall with loss of consciousness. EXAM: CHEST - 2 VIEW COMPARISON:  Chest x-ray dated August 09, 2021. FINDINGS: The heart size and mediastinal contours are within normal limits. Both lungs are clear. The  visualized skeletal structures are unremarkable. IMPRESSION: No active cardiopulmonary disease. Electronically Signed   By: Titus Dubin M.D.   On: 09/22/2021 14:56   DG Pelvis 1-2 Views  Result Date: 09/24/2021 CLINICAL DATA:  Fall today.  Left hip pain. EXAM: PELVIS - 1-2 VIEW COMPARISON:  03/19/2021 FINDINGS: There is no evidence of pelvic fracture or diastasis. No pelvic bone lesions are seen. IMPRESSION: Negative. Electronically Signed   By: Lajean Manes M.D.   On: 09/24/2021 10:45   CT HEAD WO CONTRAST (5MM)  Result Date: 09/24/2021 CLINICAL DATA:  Head trauma, mod-severe. Unwitnessed fall, found down this morning. EXAM: CT HEAD WITHOUT CONTRAST TECHNIQUE: Contiguous axial images were obtained from the base of the skull through the vertex without intravenous contrast. COMPARISON:  09/22/2021 head CT. FINDINGS: Brain: Generalized cerebral volume loss. Nonspecific moderate subcortical and periventricular white matter hypodensity, most in keeping with chronic small vessel ischemic change. No evidence of parenchymal hemorrhage or extra-axial fluid collection. No mass lesion, mass effect, or midline shift. No CT evidence of acute infarction. Cerebral ventricle sizes are stable and concordant with the degree of cerebral volume loss. Vascular: No acute abnormality. Skull: Mild anterior frontal scalp contusion, new. No evidence of calvarial fracture. Sinuses/Orbits: The visualized paranasal sinuses are essentially clear. Other:  The mastoid air cells are unopacified. IMPRESSION: 1. Mild anterior frontal scalp contusion, new. No evidence of calvarial fracture. 2. No evidence of acute intracranial abnormality. 3. Generalized cerebral volume loss and moderate chronic small vessel ischemic changes in the cerebral white matter. Electronically Signed   By: Ilona Sorrel M.D.   On: 09/24/2021 10:49   CT HEAD WO CONTRAST (5MM)  Result Date: 09/22/2021 CLINICAL DATA:  Fall, hit head, loss of consciousness EXAM:  CT HEAD WITHOUT CONTRAST CT CERVICAL SPINE WITHOUT CONTRAST TECHNIQUE: Multidetector CT imaging of the head and cervical spine was performed following the standard protocol without intravenous contrast. Multiplanar CT image reconstructions of the cervical spine were also generated. COMPARISON:  03/19/2021 FINDINGS: CT HEAD FINDINGS Brain: No evidence of acute infarction, hemorrhage, hydrocephalus, extra-axial collection or mass lesion/mass effect. Periventricular and deep white matter hypodensity. Mild global cerebral volume loss. Vascular: No hyperdense vessel or unexpected calcification. Skull: Normal. Negative for fracture or focal lesion. Sinuses/Orbits: No acute finding. Other: Soft tissue contusion of the occiput (series 3, image 18). CT CERVICAL SPINE FINDINGS Alignment: Normal. Skull base and vertebrae: No acute fracture. No primary bone lesion or focal pathologic process. Soft tissues and spinal canal: No prevertebral fluid or swelling. No visible canal hematoma. Disc levels: Mild-to-moderate multilevel disc space height loss and osteophytosis, worst at the lower cervical levels. Upper chest: Negative. Other: None. IMPRESSION: 1. No acute intracranial pathology. Small-vessel white matter disease and cerebral volume loss. 2. Soft tissue contusion of the occiput. 3. No fracture or static subluxation of the cervical spine. 4. Mild-to-moderate multilevel disc space height  loss and osteophytosis, worst at the lower cervical levels. Electronically Signed   By: Delanna Ahmadi M.D.   On: 09/22/2021 14:06   CT Cervical Spine Wo Contrast  Result Date: 09/24/2021 CLINICAL DATA:  Neck trauma, dangerous injury mechanism (Age 73-64y). Unwitnessed fall. Found on this morning. EXAM: CT CERVICAL SPINE WITHOUT CONTRAST TECHNIQUE: Multidetector CT imaging of the cervical spine was performed without intravenous contrast. Multiplanar CT image reconstructions were also generated. COMPARISON:  09/22/2021 cervical spine CT.  FINDINGS: Alignment: Straightening of the cervical spine. No facet subluxation. Dens is well positioned between the lateral masses of C1. Skull base and vertebrae: No acute fracture. No primary bone lesion or focal pathologic process. Soft tissues and spinal canal: No prevertebral edema. No visible canal hematoma. Disc levels: Moderate multilevel cervical degenerative disc disease, most prominent at C6-7. Mild-to-moderate bilateral cervical facet arthropathy. Moderate degenerative foraminal stenosis on the right at C3-4. Mild degenerative foraminal stenosis bilaterally at C5-6 and C6-7. Upper chest: No acute abnormality. Other: Visualized mastoid air cells appear clear. No discrete thyroid nodules. No pathologically enlarged cervical nodes. IMPRESSION: 1. No cervical spine fracture or subluxation. 2. Moderate multilevel cervical degenerative changes as detailed. Electronically Signed   By: Ilona Sorrel M.D.   On: 09/24/2021 10:53   CT Cervical Spine Wo Contrast  Result Date: 09/22/2021 CLINICAL DATA:  Fall, hit head, loss of consciousness EXAM: CT HEAD WITHOUT CONTRAST CT CERVICAL SPINE WITHOUT CONTRAST TECHNIQUE: Multidetector CT imaging of the head and cervical spine was performed following the standard protocol without intravenous contrast. Multiplanar CT image reconstructions of the cervical spine were also generated. COMPARISON:  03/19/2021 FINDINGS: CT HEAD FINDINGS Brain: No evidence of acute infarction, hemorrhage, hydrocephalus, extra-axial collection or mass lesion/mass effect. Periventricular and deep white matter hypodensity. Mild global cerebral volume loss. Vascular: No hyperdense vessel or unexpected calcification. Skull: Normal. Negative for fracture or focal lesion. Sinuses/Orbits: No acute finding. Other: Soft tissue contusion of the occiput (series 3, image 18). CT CERVICAL SPINE FINDINGS Alignment: Normal. Skull base and vertebrae: No acute fracture. No primary bone lesion or focal pathologic  process. Soft tissues and spinal canal: No prevertebral fluid or swelling. No visible canal hematoma. Disc levels: Mild-to-moderate multilevel disc space height loss and osteophytosis, worst at the lower cervical levels. Upper chest: Negative. Other: None. IMPRESSION: 1. No acute intracranial pathology. Small-vessel white matter disease and cerebral volume loss. 2. Soft tissue contusion of the occiput. 3. No fracture or static subluxation of the cervical spine. 4. Mild-to-moderate multilevel disc space height loss and osteophytosis, worst at the lower cervical levels. Electronically Signed   By: Delanna Ahmadi M.D.   On: 09/22/2021 14:06   DG Hand Complete Right  Result Date: 09/24/2021 CLINICAL DATA:  Fall today.  Right hand pain and swelling. EXAM: RIGHT HAND - COMPLETE 3+ VIEW COMPARISON:  None. FINDINGS: Fracture of the distal phalanx of the right thumb. There is an oblique transverse fracture component across the mid shaft to base and a longitudinal component extending to the distal tuft. No fracture displacement and no angulation. No other fractures.  No bone lesions.  Joints are normally aligned. There are osteoarthritic changes involving the interphalangeal joints, most prominently the second and third DIP joints. Skeletal structures are demineralized. Soft tissue swelling noted of the thumb. IMPRESSION: 1. Comminuted, non displaced and non angulated fracture of the distal phalanx of the right thumb. Electronically Signed   By: Lajean Manes M.D.   On: 09/24/2021 10:44    Procedures .Ortho Injury  Treatment  Date/Time: 09/24/2021 12:54 PM Performed by: Nettie Elm, PA-C Authorized by: Nettie Elm, PA-C   Consent:    Consent obtained:  Verbal   Consent given by:  Patient   Risks discussed:  Fracture, irreducible dislocation, recurrent dislocation, nerve damage, stiffness, restricted joint movement and vascular damage   Alternatives discussed:  No treatment, alternative treatment,  immobilization, referral and delayed treatmentInjury location: finger Location details: right thumb Injury type: fracture Fracture type: distal phalanx MCP joint involved: no IP joint involved: no Pre-procedure neurovascular assessment: neurovascularly intact Pre-procedure distal perfusion: normal Pre-procedure neurological function: normal Pre-procedure range of motion: normal  Anesthesia: Local anesthesia used: no  Patient sedated: NoManipulation performed: no Immobilization: splint Splint Applied by: ED Nurse Post-procedure neurovascular assessment: post-procedure neurovascularly intact Post-procedure distal perfusion: normal Post-procedure neurological function: normal Post-procedure range of motion: normal     Medications Ordered in ED Medications - No data to display  ED Course  I have reviewed the triage vital signs and the nursing notes.  Pertinent labs & imaging results that were available during my care of the patient were reviewed by me and considered in my medical decision making (see chart for details).  Here for evaluation of being found on floor at living facility. Seen here 2 days ago for possible fall versus syncope, at that time family did not want further work-up for syncope and she was sent back to her facility.  Apparently today staff went to check on patient and she was found down on the ground for unknown duration of time.  Patient denies any complaints.  She has no obvious focal neurologic deficits on exam.  She is pleasantly confused however is able to tell you her name, date of birth that she is at the hospital.  Does have ecchymosis tenderness to right distal thumb however good perfusion.  Small scalp hematoma however no overlying laceration to scalp.  Patient denies any complaints.  Discussed with daughter via phone for family's desires for work-up.  They are agreeable for labs, imaging, touch base with family for final disposition.  Labs and imaging  personally reviewed and interpreted: CBC without leukocytosis CMP glucose 128 creatinine 1.0 CK 213 Trop 8>> delta pending UA pending EKG without ischemic changes CT head scalp contusion without intracranial bleed, infarct CT cervical without acute findings DG chest without acute findings DG pelvis without acute abnormality DG right hand Thumb distal phalanx fracture, will place in static finger splint   Patient reassessed. Continues to have no complaints   Care transferred to [facility] who will FU on repeat trop and UA and discuss with daughter for dispo. Family still would prefer no admission at this time  Patient seen and evaluated by attending, Dr. Roslynn Amble who agrees above treatment, plan and disposition  Clinical Course as of 09/25/21 0702  Fri Sep 24, 2021  1617 Urinalysis, Routine w reflex microscopic(!) UA appears infectious.  Patient discussed with her daughter.  Her daughter would prefer that her UTI be treated and she be discharged back to her facility.  CBC without leukocytosis.  Patient afebrile and nontachycardic.  She is currently sitting upright in bed, answering questions, and eating a sandwich.  No acute distress noted.  Feel that discharge tonight is reasonable.  We will give her a dose of Rocephin and discharge her back to her facility on a course of Keflex. [LJ]    Clinical Course User Index [LJ] Rayna Sexton, PA-C   MDM Rules/Calculators/A&P  Final Clinical Impression(s) / ED Diagnoses Final diagnoses:  Fall, initial encounter  Displaced fracture of distal phalanx of right thumb, initial encounter for closed fracture    Rx / DC Orders ED Discharge Orders     None        Nandi Tonnesen A, PA-C 09/25/21 0704    Lucrezia Starch, MD 09/25/21 2033

## 2021-09-24 NOTE — Progress Notes (Signed)
Orthopedic Tech Progress Note Patient Details:  [name] 1932/11/02 [id number]  Ortho Devices Type of Ortho Device: Finger splint Ortho Device/Splint Location: RUE Ortho Device/Splint Interventions: Application, Ordered   Post Interventions Patient Tolerated: Well  [name] 09/24/2021, 1:20 PM

## 2021-09-24 NOTE — ED Notes (Signed)
Pt back in room.

## 2021-09-24 NOTE — Discharge Instructions (Addendum)
I am prescribing [name] an antibiotic called Keflex for a likely UTI.  Please give this to her 4 times a day for the next 7 days.  If she develops any new or worsening symptoms please bring her back to the emergency department.

## 2021-09-24 NOTE — ED Notes (Signed)
Purewick placed on pt to catch  urine sample

## 2021-09-24 NOTE — ED Notes (Signed)
Patient transported to X-ray 

## 2021-09-24 NOTE — ED Provider Notes (Signed)
Patient is an 87 year old female whose care was transferred to me from [facility] PA-C at shift change.  Her HPI is below:  [name] is a 87 y.o. female with medical history significant for CKD, diabetes, Alzheimer's who presents for evaluation of fall versus syncope.   Level 5 Caveat- Dementia   Per EMS patient resident at [facility].  On morning rounds they found patient on the floor.  Unknown how long down.  She has had a gradual decline since September.  Diagnosed with COVID 2 months ago.  No recent sick contacts or illnesses.  Was actually seen here in ED 2 days ago for possible fall versus syncope.  At that time attending had discussed with daughter who stated patient would not want admission or further work-up for syncope at that time.  Patient is on baby aspirin daily however no additional anticoagulation.   1010: Collateral from daughter Christ Kick via phone at [phone]   Agrees with Labs and imaging for now and will discuss further possible workup dispo once labs, imaging resulted  Physical Exam  BP (!) 149/66 (BP Location: Left Arm)   Pulse 60   Temp 97.8 F (36.6 C) (Oral)   Resp 15   Ht 5\' 4"  (1.626 m)   Wt 74 kg   SpO2 96%   BMI 28.00 kg/m   Physical Exam Vitals and nursing note reviewed.  Constitutional:      General: She is not in acute distress.    Appearance: She is well-developed. She is not ill-appearing, toxic-appearing or diaphoretic.     Comments: Pleasantly confused  HENT:     Head: Normocephalic.     Comments: Small scalp hematoma to posterior occipital region. No obvious laceration    Nose: Nose normal. No congestion or rhinorrhea.     Mouth/Throat:     Mouth: Mucous membranes are moist.  Eyes:     Pupils: Pupils are equal, round, and reactive to light.  Cardiovascular:     Rate and Rhythm: Normal rate.     Pulses: Normal pulses.          Radial pulses are 2+ on the right side and 2+ on the left side.       Dorsalis pedis pulses are 2+  on the right side and 2+ on the left side.     Heart sounds: Normal heart sounds.  Pulmonary:     Effort: Pulmonary effort is normal. No respiratory distress.     Breath sounds: Normal breath sounds and air entry.     Comments: Clear Bl, speaks in full sentences without difficulty Chest:     Comments: Old yellow ecchymosis to posterior right ribs without overlying tenderness, crepitus, step off Abdominal:     General: Bowel sounds are normal. There is no distension.     Tenderness: There is no abdominal tenderness. There is no right CVA tenderness, left CVA tenderness, guarding or rebound.     Comments: Soft non tender without rebound or guarding  Musculoskeletal:        General: Normal range of motion.     Right shoulder: Normal.     Left shoulder: Normal.     Right upper arm: Normal.     Left upper arm: Normal.     Right elbow: Normal.     Left elbow: Normal.     Right forearm: Normal.     Left forearm: Normal.     Right wrist: Normal.     Left wrist: Normal.  Right hand: Swelling and tenderness (distal right thumb) present.     Left hand: Normal.     Cervical back: Normal and normal range of motion.     Lumbar back: Normal.     Right hip: Normal.     Left hip: Normal.     Right knee: Normal.     Left knee: Normal.     Right lower leg: Normal.     Left lower leg: Normal.     Right ankle: No swelling.     Left ankle: Normal.     Right foot: Normal.     Left foot: Normal.     Comments: No midline C/T/L tenderness. Moves all 4 extremities without difficulty. No shortening or rotation of legs. No tenderness to BL upper or lower extremities. Lifts Bl legs off bed. Can flex and extend at all major joints. Echymosis and tenderness to right distal thumb. Non tender scaphoid, wrist Bl.  Skin:    General: Skin is warm and dry.     Capillary Refill: Capillary refill takes less than 2 seconds.     Comments: Ecchymosis right distal thumb No lacerations abrasions  Neurological:      General: No focal deficit present.     Mental Status: She is alert. Mental status is at baseline.     Comments: Confused, alert to person, DOB, location (hospital) not time CN 2-12 grossly intact Follow commands Equal strength bil  Psychiatric:        Mood and Affect: Mood normal.  ED Course/Procedures   Clinical Course as of 09/24/21 1619  Fri Sep 24, 2021  1617 Urinalysis, Routine w reflex microscopic(!) UA appears infectious.  Patient discussed with her daughter.  Her daughter would prefer that her UTI be treated and she be discharged back to her facility.  CBC without leukocytosis.  Patient afebrile and nontachycardic.  She is currently sitting upright in bed, answering questions, and eating a sandwich.  No acute distress noted.  Feel that discharge tonight is reasonable.  We will give her a dose of Rocephin and discharge her back to her facility on a course of Keflex. [LJ]    Clinical Course User Index [LJ] Rayna Sexton, PA-C    Procedures  MDM  Patient is an 87 year old female whose care was transferred me at shift change from prior PA-C.  Please see her note below for additional details.  In summary, patient is here for evaluation after being found on the floor at her living facility.  She was seen 2 days ago for a possible fall versus syncope as well.  At that time the family did not want admission or further work-up and she was discharged back to her facility.  Today the staff once again went to check on her and she was found down on the ground for an unknown duration of time.  Patient denies any complaints.  No obvious focal neurological deficits.  Reassuring lab work.  X-ray of the right thumb shows a distal phalanx fracture.  Static finger splint placed.  At the time of shift change patient pending second troponin as well as UA.  These have resulted showing a second troponin of 10 with an initial troponin of 8.  UA with large leukocytes, greater than 50 white blood cells, many  bacteria.  Appears infectious.  Patient discussed with her daughter who would prefer discharge back home.  Feel that this is reasonable.  Labs otherwise appear generally reassuring.  Clinically does not appear septic.  Will give patient 1 g of IV Rocephin and discharge on a course of Keflex.  Feel the patient is stable for discharge at this time and her daughter is agreeable.  Will discharge patient back to her facility.         Rayna Sexton, PA-C 09/24/21 1942    Lucrezia Starch, MD 09/25/21 2028

## 2021-09-24 NOTE — ED Triage Notes (Signed)
Pt came from SNF after unwitnessed fall of unknown time, she was found this morning, denies LOC and is not on blood thinners, she does have swelling and bruising on right thumb, denies N/V, and is at baseline orientation per facility

## 2021-09-26 ENCOUNTER — Emergency Department (HOSPITAL_COMMUNITY): Payer: Medicare Other

## 2021-09-26 ENCOUNTER — Other Ambulatory Visit: Payer: Self-pay

## 2021-09-26 ENCOUNTER — Encounter (HOSPITAL_COMMUNITY): Payer: Self-pay | Admitting: Emergency Medicine

## 2021-09-26 ENCOUNTER — Inpatient Hospital Stay (HOSPITAL_COMMUNITY)
Admission: EM | Admit: 2021-09-26 | Discharge: 2021-09-30 | DRG: 481 | Disposition: A | Discharge status: Skilled Nursing Facility | Payer: Medicare Other | Attending: Internal Medicine | Admitting: Internal Medicine

## 2021-09-26 DIAGNOSIS — W19XXXA Unspecified fall, initial encounter: Secondary | ICD-10-CM

## 2021-09-26 DIAGNOSIS — Z419 Encounter for procedure for purposes other than remedying health state, unspecified: Secondary | ICD-10-CM

## 2021-09-26 DIAGNOSIS — Z882 Allergy status to sulfonamides status: Secondary | ICD-10-CM | POA: Diagnosis not present

## 2021-09-26 DIAGNOSIS — I129 Hypertensive chronic kidney disease with stage 1 through stage 4 chronic kidney disease, or unspecified chronic kidney disease: Secondary | ICD-10-CM | POA: Diagnosis present

## 2021-09-26 DIAGNOSIS — D62 Acute posthemorrhagic anemia: Secondary | ICD-10-CM | POA: Diagnosis not present

## 2021-09-26 DIAGNOSIS — Z20822 Contact with and (suspected) exposure to covid-19: Secondary | ICD-10-CM | POA: Diagnosis present

## 2021-09-26 DIAGNOSIS — I700 Atherosclerosis of aorta: Secondary | ICD-10-CM | POA: Diagnosis present

## 2021-09-26 DIAGNOSIS — T148XXA Other injury of unspecified body region, initial encounter: Secondary | ICD-10-CM

## 2021-09-26 DIAGNOSIS — N179 Acute kidney failure, unspecified: Secondary | ICD-10-CM | POA: Diagnosis not present

## 2021-09-26 DIAGNOSIS — G309 Alzheimer's disease, unspecified: Secondary | ICD-10-CM | POA: Diagnosis present

## 2021-09-26 DIAGNOSIS — Z7901 Long term (current) use of anticoagulants: Secondary | ICD-10-CM | POA: Diagnosis not present

## 2021-09-26 DIAGNOSIS — N1831 Chronic kidney disease, stage 3a: Secondary | ICD-10-CM | POA: Diagnosis present

## 2021-09-26 DIAGNOSIS — E1165 Type 2 diabetes mellitus with hyperglycemia: Secondary | ICD-10-CM | POA: Diagnosis present

## 2021-09-26 DIAGNOSIS — Z01810 Encounter for preprocedural cardiovascular examination: Secondary | ICD-10-CM

## 2021-09-26 DIAGNOSIS — S72001A Fracture of unspecified part of neck of right femur, initial encounter for closed fracture: Secondary | ICD-10-CM | POA: Diagnosis present

## 2021-09-26 DIAGNOSIS — M80051A Age-related osteoporosis with current pathological fracture, right femur, initial encounter for fracture: Principal | ICD-10-CM | POA: Diagnosis present

## 2021-09-26 DIAGNOSIS — Z8616 Personal history of COVID-19: Secondary | ICD-10-CM

## 2021-09-26 DIAGNOSIS — Z91040 Latex allergy status: Secondary | ICD-10-CM

## 2021-09-26 DIAGNOSIS — R296 Repeated falls: Secondary | ICD-10-CM | POA: Diagnosis present

## 2021-09-26 DIAGNOSIS — Z8249 Family history of ischemic heart disease and other diseases of the circulatory system: Secondary | ICD-10-CM

## 2021-09-26 DIAGNOSIS — E119 Type 2 diabetes mellitus without complications: Secondary | ICD-10-CM

## 2021-09-26 DIAGNOSIS — F0280 Dementia in other diseases classified elsewhere without behavioral disturbance: Secondary | ICD-10-CM | POA: Diagnosis present

## 2021-09-26 DIAGNOSIS — R000 Tachycardia, unspecified: Secondary | ICD-10-CM | POA: Diagnosis present

## 2021-09-26 DIAGNOSIS — G3184 Mild cognitive impairment, so stated: Secondary | ICD-10-CM | POA: Diagnosis present

## 2021-09-26 DIAGNOSIS — S62521A Displaced fracture of distal phalanx of right thumb, initial encounter for closed fracture: Secondary | ICD-10-CM | POA: Diagnosis present

## 2021-09-26 DIAGNOSIS — E1122 Type 2 diabetes mellitus with diabetic chronic kidney disease: Secondary | ICD-10-CM | POA: Diagnosis present

## 2021-09-26 DIAGNOSIS — E785 Hyperlipidemia, unspecified: Secondary | ICD-10-CM | POA: Diagnosis present

## 2021-09-26 DIAGNOSIS — S0990XA Unspecified injury of head, initial encounter: Secondary | ICD-10-CM | POA: Diagnosis present

## 2021-09-26 DIAGNOSIS — Z9181 History of falling: Secondary | ICD-10-CM

## 2021-09-26 DIAGNOSIS — Z7982 Long term (current) use of aspirin: Secondary | ICD-10-CM

## 2021-09-26 DIAGNOSIS — Z87891 Personal history of nicotine dependence: Secondary | ICD-10-CM | POA: Diagnosis not present

## 2021-09-26 DIAGNOSIS — Z79899 Other long term (current) drug therapy: Secondary | ICD-10-CM

## 2021-09-26 DIAGNOSIS — E118 Type 2 diabetes mellitus with unspecified complications: Secondary | ICD-10-CM

## 2021-09-26 DIAGNOSIS — I10 Essential (primary) hypertension: Secondary | ICD-10-CM | POA: Diagnosis present

## 2021-09-26 DIAGNOSIS — R739 Hyperglycemia, unspecified: Secondary | ICD-10-CM | POA: Diagnosis present

## 2021-09-26 HISTORY — DX: Cerebral infarction, unspecified: I63.9

## 2021-09-26 LAB — CBC WITH DIFFERENTIAL/PLATELET
Abs Immature Granulocytes: 0.1 10*3/uL — ABNORMAL HIGH (ref 0.00–0.07)
Basophils Absolute: 0 10*3/uL (ref 0.0–0.1)
Basophils Relative: 0 %
Eosinophils Absolute: 0.2 10*3/uL (ref 0.0–0.5)
Eosinophils Relative: 2 %
HCT: 40.1 % (ref 36.0–46.0)
Hemoglobin: 12.6 g/dL (ref 12.0–15.0)
Immature Granulocytes: 1 %
Lymphocytes Relative: 10 %
Lymphs Abs: 1.1 10*3/uL (ref 0.7–4.0)
MCH: 28.6 pg (ref 26.0–34.0)
MCHC: 31.4 g/dL (ref 30.0–36.0)
MCV: 90.9 fL (ref 80.0–100.0)
Monocytes Absolute: 0.7 10*3/uL (ref 0.1–1.0)
Monocytes Relative: 6 %
Neutro Abs: 9.3 10*3/uL — ABNORMAL HIGH (ref 1.7–7.7)
Neutrophils Relative %: 81 %
Platelets: 212 10*3/uL (ref 150–400)
RBC: 4.41 MIL/uL (ref 3.87–5.11)
RDW: 13.7 % (ref 11.5–15.5)
WBC: 11.5 10*3/uL — ABNORMAL HIGH (ref 4.0–10.5)
nRBC: 0 % (ref 0.0–0.2)

## 2021-09-26 LAB — URINE CULTURE: Culture: >=100000 — AB

## 2021-09-26 LAB — MAGNESIUM: Magnesium: 2 mg/dL (ref 1.7–2.4)

## 2021-09-26 LAB — BASIC METABOLIC PANEL
Anion gap: 7 (ref 5–15)
BUN: 12 mg/dL (ref 8–23)
CO2: 27 mmol/L (ref 22–32)
Calcium: 8.8 mg/dL — ABNORMAL LOW (ref 8.9–10.3)
Chloride: 104 mmol/L (ref 98–111)
Creatinine, Ser: 0.96 mg/dL (ref 0.44–1.00)
GFR, Estimated: 57 mL/min — ABNORMAL LOW (ref >60)
Glucose, Bld: 160 mg/dL — ABNORMAL HIGH (ref 70–99)
Potassium: 3.5 mmol/L (ref 3.5–5.1)
Sodium: 138 mmol/L (ref 135–145)

## 2021-09-26 LAB — RESP PANEL BY RT-PCR (FLU A&B, COVID) ARPGX2
Influenza A by PCR: NEGATIVE
Influenza B by PCR: NEGATIVE
SARS Coronavirus 2 by RT PCR: NEGATIVE

## 2021-09-26 LAB — GLUCOSE, CAPILLARY: Glucose-Capillary: 124 mg/dL — ABNORMAL HIGH (ref 70–99)

## 2021-09-26 LAB — PHOSPHORUS: Phosphorus: 5.1 mg/dL — ABNORMAL HIGH (ref 2.5–4.6)

## 2021-09-26 MED ORDER — HYDROCODONE-ACETAMINOPHEN 5-325 MG PO TABS
1.0000 | ORAL_TABLET | Freq: Four times a day (QID) | ORAL | Status: DC | PRN
  Administered 2021-09-27: 1 via ORAL
  Administered 2021-09-29: 2 via ORAL
  Filled 2021-09-26: qty 1
  Filled 2021-09-26 (×2): qty 2

## 2021-09-26 MED ORDER — MORPHINE SULFATE (PF) 2 MG/ML IV SOLN
0.5000 mg | INTRAVENOUS | Status: DC | PRN
  Administered 2021-09-26 – 2021-09-29 (×4): 0.5 mg via INTRAVENOUS
  Filled 2021-09-26 (×4): qty 1

## 2021-09-26 MED ORDER — SIMVASTATIN 20 MG PO TABS
20.0000 mg | ORAL_TABLET | Freq: Every day | ORAL | Status: DC
  Administered 2021-09-27 – 2021-09-30 (×4): 20 mg via ORAL
  Filled 2021-09-26 (×4): qty 1

## 2021-09-26 MED ORDER — MEMANTINE HCL 10 MG PO TABS
10.0000 mg | ORAL_TABLET | Freq: Two times a day (BID) | ORAL | Status: DC
  Administered 2021-09-26 – 2021-09-30 (×7): 10 mg via ORAL
  Filled 2021-09-26 (×7): qty 1

## 2021-09-26 MED ORDER — POVIDONE-IODINE 10 % EX SWAB
2.0000 "application " | CUTANEOUS | Status: AC
  Administered 2021-09-27: 2 via TOPICAL

## 2021-09-26 MED ORDER — MORPHINE SULFATE (PF) 2 MG/ML IV SOLN
2.0000 mg | Freq: Once | INTRAVENOUS | Status: AC
  Administered 2021-09-26: 2 mg via INTRAVENOUS
  Filled 2021-09-26: qty 1

## 2021-09-26 MED ORDER — ACETAMINOPHEN 500 MG PO TABS
1000.0000 mg | ORAL_TABLET | Freq: Once | ORAL | Status: AC
  Administered 2021-09-27: 1000 mg via ORAL
  Filled 2021-09-26: qty 2

## 2021-09-26 MED ORDER — CHLORHEXIDINE GLUCONATE 4 % EX LIQD
60.0000 mL | Freq: Once | CUTANEOUS | Status: AC
  Administered 2021-09-27: 4 via TOPICAL

## 2021-09-26 MED ORDER — TRANEXAMIC ACID-NACL 1000-0.7 MG/100ML-% IV SOLN
1000.0000 mg | INTRAVENOUS | Status: AC
  Administered 2021-09-27: 1000 mg via INTRAVENOUS
  Filled 2021-09-26: qty 100

## 2021-09-26 MED ORDER — MAGNESIUM OXIDE -MG SUPPLEMENT 400 (240 MG) MG PO TABS
400.0000 mg | ORAL_TABLET | Freq: Two times a day (BID) | ORAL | Status: DC
  Administered 2021-09-26 – 2021-09-30 (×7): 400 mg via ORAL
  Filled 2021-09-26 (×6): qty 1

## 2021-09-26 MED ORDER — METOPROLOL TARTRATE 25 MG PO TABS
25.0000 mg | ORAL_TABLET | Freq: Two times a day (BID) | ORAL | Status: DC
  Administered 2021-09-28 – 2021-09-30 (×4): 25 mg via ORAL
  Filled 2021-09-26 (×6): qty 1

## 2021-09-26 MED ORDER — KETOROLAC TROMETHAMINE 15 MG/ML IJ SOLN
15.0000 mg | Freq: Once | INTRAMUSCULAR | Status: AC
  Administered 2021-09-26: 15 mg via INTRAVENOUS
  Filled 2021-09-26: qty 1

## 2021-09-26 MED ORDER — POTASSIUM CHLORIDE CRYS ER 20 MEQ PO TBCR
40.0000 meq | EXTENDED_RELEASE_TABLET | Freq: Once | ORAL | Status: AC
  Administered 2021-09-29: 40 meq via ORAL
  Filled 2021-09-26: qty 2

## 2021-09-26 MED ORDER — MELATONIN 3 MG PO TABS
3.0000 mg | ORAL_TABLET | Freq: Every day | ORAL | Status: DC
  Administered 2021-09-26 – 2021-09-29 (×4): 3 mg via ORAL
  Filled 2021-09-26 (×4): qty 1

## 2021-09-26 MED ORDER — SERTRALINE HCL 100 MG PO TABS
100.0000 mg | ORAL_TABLET | Freq: Every day | ORAL | Status: DC
  Administered 2021-09-26 – 2021-09-29 (×4): 100 mg via ORAL
  Filled 2021-09-26 (×4): qty 1

## 2021-09-26 MED ORDER — CEFAZOLIN SODIUM-DEXTROSE 2-4 GM/100ML-% IV SOLN
2.0000 g | INTRAVENOUS | Status: AC
  Administered 2021-09-27: 2 g via INTRAVENOUS
  Filled 2021-09-26: qty 100

## 2021-09-26 MED ORDER — SENNOSIDES-DOCUSATE SODIUM 8.6-50 MG PO TABS
1.0000 | ORAL_TABLET | Freq: Two times a day (BID) | ORAL | Status: DC
  Administered 2021-09-26 – 2021-09-30 (×7): 1 via ORAL
  Filled 2021-09-26 (×7): qty 1

## 2021-09-26 MED ORDER — HYDROMORPHONE HCL 1 MG/ML IJ SOLN
0.7500 mg | Freq: Once | INTRAMUSCULAR | Status: AC
  Administered 2021-09-26: 0.75 mg via INTRAVENOUS
  Filled 2021-09-26: qty 1

## 2021-09-26 MED ORDER — VITAMIN B-12 1000 MCG PO TABS
1000.0000 ug | ORAL_TABLET | Freq: Two times a day (BID) | ORAL | Status: DC
Start: 2021-09-26 — End: 2021-10-01
  Administered 2021-09-26 – 2021-09-30 (×7): 1000 ug via ORAL
  Filled 2021-09-26 (×9): qty 1

## 2021-09-26 NOTE — H&P (Signed)
History and Physical    [name] [name] DOB: 1932-09-17 DOA: 09/26/2021  PCP: Mayra Neer, MD  Patient coming from: [facility] facility.  I have personally briefly reviewed patient's old medical records in [place]  Chief Complaint: Fall  HPI: [name] is a 87 y.o. female with medical history significant of 87 year old female with a past medical history of hypertension, type II DM, mild cognitive impairment, wide-complex tachycardia, history of syncope, history of COVID-19 who was brought to the emergency department via EMS after having an unwitnessed fall hitting her head on a carpeted floor and losing consciousness for about 2 minutes care facility around 0200.  She also had pain in her right hip area.  The patient does not remember well how she fell.  She denied having a headache, nausea, dizziness or blurred vision.  Denied chest, back or abdominal pain.  Per patient, no appetite or sleep issues.  History is limited by the patient's baseline mental status and medication effect.  ED Course: Initial vital signs were temperature 97.6 F, pulse 63, respiration 18, BP 152/72 mmHg O2 sat 99% on room air.  The patient received morphine 2 mg IVP.  I added hydromorphone 0.75 mg x 1 and Toradol 15 mg IVP x1.  Lab work: Her CBC had a white count 11.5, hemoglobin 12.6 g/dL platelets 212.  BMP showed a glucose of 160 and calcium 8.8 mg/dL.  The rest of the results are within normal limits.  Imaging: 1 view chest, 2-3 lumbar spine, right knee 1-2 view CT head and CT cervical spine without contrast did not show any acute finding.  However, there were multiple degenerative changes seen.  Right hip x-rays show a comminuted, displaced intertrochanteric right hip fracture.  Please see images and full radiology report for further details.  Review of Systems: As per HPI otherwise all other systems reviewed and are negative.  Past Medical History:  Diagnosis Date   Allergies     Alzheimer disease ([place])    STABLE   Anemia, iron deficiency 12/12/2011   Arthritis    RF   CKD (chronic kidney disease)    STAGE 3   Diabetes mellitus    no mes, diet only    Diarrhea    RESOLVED   Hypercholesterolemia    Hyperlipemia    Hypertension    Major depression    OA (osteoarthritis)    HAND/LEFT KNEE PAIN MILD   Osteoporosis    Syncope    Past Surgical History:  Procedure Laterality Date   left knee arthroscopy     ORIF HUMERUS FRACTURE  12/09/2011   Procedure: OPEN REDUCTION INTERNAL FIXATION (ORIF) DISTAL HUMERUS FRACTURE;  Surgeon: Linna Hoff, MD;  Location: [facility] ORS;  Service: Orthopedics;  Laterality: Right;   OTHER SURGICAL HISTORY     left small toe bone spur removed    TONSILLECTOMY     Social History  reports that she quit smoking about 11 years ago. Her smoking use included cigarettes. She has never used smokeless tobacco. She reports that she does not drink alcohol and does not use drugs.  Allergies  Allergen Reactions   Sulfa Antibiotics Hives, Diarrhea, Nausea And Vomiting and Swelling    Facial swelling ALSO   Latex Rash   Family History  Problem Relation Age of Onset   Heart Problems Mother    Heart attack Maternal Grandmother        died from a "heart attack"   Stroke Neg Hx  Arrhythmia Neg Hx    Prior to Admission medications   Medication Sig Start Date End Date Taking? Authorizing Provider  acetaminophen (TYLENOL) 500 MG tablet Take 500 mg by mouth every 8 (eight) hours as needed for mild pain.   Yes [provider]  aspirin 325 MG tablet Take 325 mg by mouth daily.   Yes [provider]  Calcium Carbonate-Vitamin D3 600-400 MG-UNIT TABS Take 2 tablets by mouth every morning.   Yes [provider]  cephALEXin (KEFLEX) 500 MG capsule Take 1 capsule (500 mg total) by mouth 4 (four) times daily for 7 days. 09/24/21 10/01/21 Yes Placido Sou, PA-C  docusate sodium (COLACE) 100 MG capsule Take 1 capsule  (100 mg total) by mouth every 12 (twelve) hours. Patient taking differently: Take 100 mg by mouth See admin instructions. 100mg  every 12 hours  AND 100mg  daily as needed for constipation 12/27/16  Yes Ward, Kristen N, DO  Melatonin 3 MG TABS Take 3 mg by mouth at bedtime.   Yes [provider]  memantine (NAMENDA) 10 MG tablet Take 10 mg by mouth 2 (two) times daily.   Yes [provider]  metoprolol tartrate (LOPRESSOR) 25 MG tablet Take 0.5 tablets (12.5 mg total) by mouth 2 (two) times daily. Patient taking differently: Take 25 mg by mouth 2 (two) times daily. 08/16/21  Yes Azucena Fallen, MD  sertraline (ZOLOFT) 100 MG tablet Take 100 mg by mouth at bedtime.   Yes [provider]  simvastatin (ZOCOR) 20 MG tablet Take 1 tablet (20 mg total) by mouth daily at 6 PM. 08/19/21  Yes Azucena Fallen, MD  vitamin B-12 (CYANOCOBALAMIN) 1000 MCG tablet Take 1,000 mcg by mouth 2 (two) times daily.   Yes [provider]  acetaminophen (TYLENOL) 325 MG tablet Take 1 tablet (325 mg total) by mouth every 6 (six) hours as needed for moderate pain. Patient not taking: Reported on 09/26/2021 10/10/20   Placido Sou, PA-C  apixaban (ELIQUIS) 2.5 MG TABS tablet Take 1 tablet (2.5 mg total) by mouth 2 (two) times daily. 08/16/21   Azucena Fallen, MD   Physical Exam: Vitals:   09/26/21 0730 09/26/21 0848 09/26/21 1005 09/26/21 1330  BP: (!) 152/72 (!) 162/66 (!) 139/91 (!) 120/106  Pulse: 63 63 69 73  Resp: 18 18 18 16   Temp: 97.6 F (36.4 C)     TempSrc: Oral     SpO2: 99% 100% 100% 98%  Weight:      Height:       Constitutional: NAD, calm, comfortable Eyes: PERRL, lids and conjunctivae normal ENMT: Mucous membranes are moist. Posterior pharynx clear of any exudate or lesions. Neck: normal, supple, no masses, no thyromegaly Respiratory: clear to auscultation bilaterally, no wheezing, no crackles. Normal respiratory effort. No accessory muscle use.   Cardiovascular: Regular rate and rhythm, no murmurs / rubs / gallops. No extremity edema. 2+ pedal pulses. No carotid bruits.  Abdomen: Soft, no tenderness, no masses palpated. No hepatosplenomegaly. Bowel sounds positive.  Musculoskeletal: no clubbing / cyanosis.  Shortened and laterally rotated RLE, no contractures. Normal muscle tone.  Skin: no rashes, lesions, ulcers on limited dermatological examination. Neurologic: CN 2-12 grossly intact. Sensation intact, DTR normal. Strength 5/5 in all 4.  Psychiatric: Normal judgment and insight. Alert and oriented x 3. Normal mood.   Labs on Admission: I have personally reviewed following labs and imaging studies  CBC: Recent Labs  Lab 09/22/21 1415 09/24/21 1104 09/26/21 0742  WBC  9.4 9.2 11.5*  NEUTROABS 6.5 6.6 9.3*  HGB 12.7 12.9 12.6  HCT 40.1 41.4 40.1  MCV 90.9 91.6 90.9  PLT 230 204 212   Basic Metabolic Panel: Recent Labs  Lab 09/22/21 1415 09/24/21 1104 09/26/21 0742  NA 141 137 138  K 3.6 3.9 3.5  CL 102 102 104  CO2 29 27 27   GLUCOSE 121* 128* 160*  BUN 12 9 12   CREATININE 1.33* 1.01* 0.96  CALCIUM 8.9 8.9 8.8*   GFR: Estimated Creatinine Clearance: 40.7 mL/min (by C-G formula based on SCr of 0.96 mg/dL).  Liver Function Tests: Recent Labs  Lab 09/24/21 1104  AST 21  ALT 12  ALKPHOS 57  BILITOT 0.8  PROT 6.3*  ALBUMIN 3.3*   Urine analysis:    Component Value Date/Time   COLORURINE YELLOW 09/24/2021 1430   APPEARANCEUR HAZY (A) 09/24/2021 1430   LABSPEC 1.014 09/24/2021 1430   PHURINE 5.0 09/24/2021 1430   GLUCOSEU NEGATIVE 09/24/2021 1430   HGBUR MODERATE (A) 09/24/2021 1430   BILIRUBINUR NEGATIVE 09/24/2021 1430   KETONESUR NEGATIVE 09/24/2021 1430   PROTEINUR NEGATIVE 09/24/2021 1430        NITRITE NEGATIVE 09/24/2021 1430   LEUKOCYTESUR LARGE (A) 09/24/2021 1430   Radiological Exams on Admission: DG Chest 1 View  Result Date: 09/26/2021 CLINICAL DATA:  Fall this morning. Right hip  fracture. Pre-op clearance exam EXAM: CHEST  1 VIEW COMPARISON:  None. FINDINGS: The heart size and mediastinal contours are within normal limits. Aortic atherosclerotic calcification noted. Both lungs are clear. The visualized skeletal structures are unremarkable. IMPRESSION: No active disease. Electronically Signed   By: 13/02/2021 M.D.   On: 09/26/2021 08:50   DG Lumbar Spine 2-3 Views  Result Date: 09/26/2021 CLINICAL DATA:  Fall this morning. Low back pain. Initial encounter. EXAM: LUMBAR SPINE - 2-3 VIEW COMPARISON:  None. FINDINGS: Posterior elements are not well visualized on the cross-table lateral projection due to artifact from the patient's mattress and bending. There is no evidence of lumbar vertebral body fracture. Alignment is normal. Intervertebral disc spaces are maintained. Lower lumbar facet DJD is noted at L4-5 and L5-S1 bilaterally. Aortic atherosclerotic calcification noted. IMPRESSION: No acute findings. Lower lumbar facet DJD. Electronically Signed   By: 13/04/2021 M.D.   On: 09/26/2021 08:49   DG Knee 1-2 Views Right  Result Date: 09/26/2021 CLINICAL DATA:  Fall this morning. Right knee pain. Initial encounter. EXAM: RIGHT KNEE - 2 VIEW COMPARISON:  None. FINDINGS: No evidence of fracture, dislocation, or joint effusion. Mild osteoarthritis is seen involving the medial compartment. Enthesopathic changes are seen involving the patella. No other osseous abnormality identified. IMPRESSION: No acute findings. Mild medial compartment osteoarthritis. Electronically Signed   By: 13/04/2021 M.D.   On: 09/26/2021 08:48   CT Head Wo Contrast  Result Date: 09/26/2021 CLINICAL DATA:  Fall this morning. Head and neck trauma. Initial encounter. EXAM: CT HEAD WITHOUT CONTRAST CT CERVICAL SPINE WITHOUT CONTRAST TECHNIQUE: Multidetector CT imaging of the head and cervical spine was performed following the standard protocol without intravenous contrast. Multiplanar CT image reconstructions  of the cervical spine were also generated. COMPARISON:  09/24/2021 FINDINGS: CT HEAD FINDINGS Brain: No evidence of acute infarction, hemorrhage, hydrocephalus, extra-axial collection, or mass lesion/mass effect. Stable moderate diffuse cerebral atrophy and chronic small vessel disease. Old infarcts are again seen involving the left cerebellum. Vascular:  No hyperdense vessel or other acute findings. Skull: No evidence of fracture or other significant bone abnormality.  Sinuses/Orbits:  No acute findings. Other: None. CT CERVICAL SPINE FINDINGS Alignment: Normal. Skull base and vertebrae: No acute fracture. No primary bone lesion or focal pathologic process. Soft tissues and spinal canal: No prevertebral fluid or swelling. No visible canal hematoma. Disc levels: Mild-to-moderate degenerative disc disease is seen at all cervical levels. Mild bilateral facet DJD also noted. Upper chest: No acute findings. Other: None. IMPRESSION: No acute intracranial abnormality. 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 No evidence of acute cervical spine fracture or subluxation. Degenerative spondylosis, as described above. Electronically Signed   By: Marlaine Hind M.D.   On: 09/26/2021 09:36   CT Cervical Spine Wo Contrast  Result Date: 09/26/2021 CLINICAL DATA:  Fall this morning. Head and neck trauma. Initial encounter. EXAM: CT HEAD WITHOUT CONTRAST CT CERVICAL SPINE WITHOUT CONTRAST TECHNIQUE: Multidetector CT imaging of the head and cervical spine was performed following the standard protocol without intravenous contrast. Multiplanar CT image reconstructions of the cervical spine were also generated. COMPARISON:  09/24/2021 FINDINGS: CT HEAD FINDINGS Brain: No evidence of acute infarction, hemorrhage, hydrocephalus, extra-axial collection, or mass lesion/mass effect. Stable moderate diffuse cerebral atrophy and chronic small vessel disease. Old infarcts are again seen involving  the left cerebellum. Vascular:  No hyperdense vessel or other acute findings. Skull: No evidence of fracture or other significant bone abnormality. Sinuses/Orbits:  No acute findings. Other: None. CT CERVICAL SPINE FINDINGS Alignment: Normal. Skull base and vertebrae: No acute fracture. No primary bone lesion or focal pathologic process. Soft tissues and spinal canal: No prevertebral fluid or swelling. No visible canal hematoma. Disc levels: Mild-to-moderate degenerative disc disease is seen at all cervical levels. Mild bilateral facet DJD also noted. Upper chest: No acute findings. Other: None. IMPRESSION: No acute intracranial abnormality. 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 No evidence of acute cervical spine fracture or subluxation. Degenerative spondylosis, as described above. Electronically Signed   By: Marlaine Hind M.D.   On: 09/26/2021 09:36   DG Hip Unilat W or Wo Pelvis 2-3 Views Right  Result Date: 09/26/2021 CLINICAL DATA:  Fall this morning. Right hip pain. Initial encounter. EXAM: DG HIP (WITH OR WITHOUT PELVIS) 2-3V RIGHT COMPARISON:  None. FINDINGS: A comminuted displaced intratrochanteric fracture the right hip is seen with impaction and medial angulation of the distal fracture fragment. No evidence of dislocation. No pelvic fracture or bone lesions identified. IMPRESSION: Comminuted, displaced intratrochanteric right hip fracture. Electronically Signed   By: Marlaine Hind M.D.   On: 09/26/2021 08:47    EKG: Independently reviewed.  Vent. rate 65 BPM PR interval 159 ms QRS duration 90 ms QT/QTcB 420/437 ms P-R-T axes 73 29 26 Sinus rhythm Borderline low voltage, extremity leads Nonspecific T abnormalities, anterior leads  Assessment/Plan Principal Problem:   Closed right hip fracture, initial encounter (HCC) Admit to telemetry/inpatient. N.p.o. after midnight.  Buck's traction per protocol. Analgesics as needed. Antiemetics as  needed. On muscle relaxant as needed. Consult TOC and nutritional services. Orthopedic surgery evaluation appreciated. Consult PT after surgery.  Active Problems:   Wide-complex tachycardia Continue cardio monitoring. Continue metoprolol 25 mg p.o. twice daily. Keep electrolytes optimized.    DM II (diabetes mellitus, type II), controlled ([place]) Carbohydrate modified diet.    Essential hypertension, benign Continue metoprolol. Monitor BP and heart rate.    Mild cognitive impairment Continue on memantine 10 mg p.o. daily. Continue sertraline 100 mg p.o. bedtime.    Hypocalcemia Recheck calcium in  AM. Further work-up depending on result.    Hyperglycemia Check fasting glucose in AM.   DVT prophylaxis: Unilateral SCD on the left.   Code Status:   Full code. Family Communication:   Disposition Plan:   Patient is from:  SNF.  Anticipated DC to:  SNF.  Anticipated DC date:  09/29/2021 or 09/30/2021.  Anticipated DC barriers: Clinical status. Consults called:  Orthopedic surgery (Dr. Percell Miller). Admission status:  Inpatient/telemetry.  Severity of Illness:  High severity in the setting of unwitnessed fall resulting in possible mild concussion and right hip fracture.  The patient will remain in the hospital for close observation, pain management and surgical intervention by orthopedic surgery.  Reubin Milan MD [facility]  How to contact the [facility] Attending or Consulting provider [place] or covering provider during after hours [place], for this patient?   Check the care team in [facility] and look for a) attending/consulting [facility] provider listed and b) the [facility] team listed Log into [email or web address] and use [place]'s universal password to access. If you do not have the password, please contact the hospital operator. Locate the [facility] provider you are looking for under [facility] and page to a number that you can be directly reached. If you still have difficulty reaching  the provider, please page the [facility] (Director on Call) for the Hospitalists listed on amion for assistance.  09/26/2021, 2:43 PM   This document was prepared using Dragon voice recognition software and may contain some unintended transcription errors.

## 2021-09-26 NOTE — ED Notes (Signed)
Report to [facility].  [facility] to bedside to pick up pt.

## 2021-09-26 NOTE — Consult Note (Addendum)
ORTHOPAEDIC CONSULTATION  REQUESTING PHYSICIAN: Bobette Mo, MD  Chief Complaint: fall  HPI: [name] is a 87 y.o. female with history of CKD, Alzheimer's, HLD, HTN, osteoporosis, recent right thumb distal phalanx fracture 2 days ago who presented to [facility] ED after an unwitnessed fall. She arrives from [facility]. She is awake on exam. Unable to provide any history. She shakes her head no when I ask if she is in pain.   Spoke with daughter [name] over the phone - she thinks that [name] does walk with an assistive device at [facility] but she has not been able to get her up and walking when she has visited recently. She states at baseline patient is oriented to self only and is able to have full conversations with [name].   Spoke with [name] at [employer] over the phone - who states that patient fell this morning. She does walk with a walker at baseline. She is not on an anticoagulant at baseline.    Past Medical History:  Diagnosis Date   Allergies    Alzheimer disease (HCC)    STABLE   Anemia, iron deficiency 12/12/2011   Arthritis    RF   CKD (chronic kidney disease)    STAGE 3   Diabetes mellitus    no mes, diet only    Diarrhea    RESOLVED   Hypercholesterolemia    Hyperlipemia    Hypertension    Major depression    OA (osteoarthritis)    HAND/LEFT KNEE PAIN MILD   Osteoporosis    Syncope    Past Surgical History:  Procedure Laterality Date   left knee arthroscopy     ORIF HUMERUS FRACTURE  12/09/2011   Procedure: OPEN REDUCTION INTERNAL FIXATION (ORIF) DISTAL HUMERUS FRACTURE;  Surgeon: Sharma Covert, MD;  Location: [facility]S;  Service: Orthopedics;  Laterality: Right;   OTHER SURGICAL HISTORY     left small toe bone spur removed    TONSILLECTOMY     Social History   Socioeconomic History   Marital status: Divorced    Spouse name: Not on file   Number of children: 2   Years of education: Not on file   Highest education level: Not on file   Occupational History   Occupation: Retired  Tobacco Use   Smoking status: Former    Types: Cigarettes    Quit date: 12/29/2009    Years since quitting: 11.7   Smokeless tobacco: Never  Vaping Use   Vaping Use: Never used  Substance and Sexual Activity   Alcohol use: No    Alcohol/week: 0.0 standard drinks   Drug use: No   Sexual activity: Never  Other Topics Concern   Not on file  Social History Narrative   Not on file   Social Determinants of Health   Financial Resource Strain: Not on file  Food Insecurity: Not on file  Transportation Needs: Not on file  Physical Activity: Not on file  Stress: Not on file  Social Connections: Not on file   Family History  Problem Relation Age of Onset   Heart Problems Mother    Heart attack Maternal Grandmother        died from a "heart attack"   Stroke Neg Hx    Arrhythmia Neg Hx    Allergies  Allergen Reactions   Sulfa Antibiotics Hives, Diarrhea, Nausea And Vomiting and Swelling    Facial swelling ALSO   Latex Rash  Positive ROS: All other systems have been reviewed and were otherwise negative with the exception of those mentioned in the HPI and as above.  Physical Exam: General: Somnolent, but easily awoken. Laying in bed in no acute distress. Cardiovascular: No pedal edema Respiratory: No cyanosis, no use of accessory musculature GI: No organomegaly, abdomen is soft and non-tender Skin: No lesions in the area of chief complaint Neurologic: Unable to assess. Psychiatric: Patient is competent for consent with normal mood and affect Lymphatic: No axillary or cervical lymphadenopathy  MUSCULOSKELETAL: RLE - externally rotated and shortened. No grimacing when I palpate her right knee or lateral right hip. She is unable to follow instructions to move toes or flex and extend at ankle. Unable to assess sensation. PT pulse found with ultrasound.   Imaging: Hip x-rays show a comminuted, displaced intratrochanteric right hip  fracture.   Assessment: Right intertrochanteric hip fracture  Plan: - Plan is to transfer patient for medicine admission and R hip IM nail with Dr. Doreatha Martin tomorrow at [facility] - please keep NPO after midnight, NWB [place], PA-C   09/26/2021 1:38 PM

## 2021-09-26 NOTE — ED Triage Notes (Signed)
Pt to ER via EMS from [facility] for fall x 2 this AM.  Both falls were unwitnessed.  Pt with pain to right hip, swelling and deformity to right knee and right thumb and bruising to forehead.  Pt with shortening and rotation to right leg.  Arrives in c-collar and pelvic binder.

## 2021-09-26 NOTE — ED Provider Notes (Signed)
[facility]-EMERGENCY DEPT Provider Note   CSN: [id number] Arrival date & time: 09/26/21  0708     History Chief Complaint  Patient presents with   Fall   Hip Pain    Tacoya Altizer Betzold is a 87 y.o. female.  87 year old female presents after unwitnessed fall just prior to arrival.  Patient is unsure how this happened.  She has a history of Alzheimer's.  Is noted to have deformity to right hip.  Was awake and oriented for her baseline.  EMS called patient placed in c-collar and transported here.      Past Medical History:  Diagnosis Date   Allergies    Alzheimer disease (HCC)    STABLE   Anemia, iron deficiency 12/12/2011   Arthritis    RF   CKD (chronic kidney disease)    STAGE 3   Diabetes mellitus    no mes, diet only    Diarrhea    RESOLVED   Hypercholesterolemia    Hyperlipemia    Hypertension    Major depression    OA (osteoarthritis)    HAND/LEFT KNEE PAIN MILD   Osteoporosis    Syncope     Patient Active Problem List   Diagnosis Date Noted   Acute metabolic encephalopathy 08/10/2021   COVID-19 08/09/2021   History of syncope 08/13/2019   Syncope and collapse 01/23/2019   Leukocytosis 01/23/2019   Diarrhea 01/23/2019   Prolonged QT interval 01/23/2019   Mild cognitive impairment 07/15/2015   Acute confusional state 06/19/2015   Memory loss 06/19/2015   DM II (diabetes mellitus, type II), controlled (HCC) 05/14/2015   Hyperlipidemia 05/14/2015   Borderline diabetes mellitus    Essential hypertension, benign    TIA (transient ischemic attack) 05/13/2015   Anemia, iron deficiency 12/12/2011   Humerus fracture 12/11/2011   Hypoxia 12/11/2011   Wide-complex tachycardia 12/11/2011   Acute hypotension 12/11/2011    Past Surgical History:  Procedure Laterality Date   left knee arthroscopy     ORIF HUMERUS FRACTURE  12/09/2011   Procedure: OPEN REDUCTION INTERNAL FIXATION (ORIF) DISTAL HUMERUS FRACTURE;  Surgeon: Sharma Covert,  MD;  Location: [facility];  Service: Orthopedics;  Laterality: Right;   OTHER SURGICAL HISTORY     left small toe bone spur removed    TONSILLECTOMY       OB History   No obstetric history on file.     Family History  Problem Relation Age of Onset   Heart Problems Mother    Heart attack Maternal Grandmother        died from a "heart attack"   Stroke Neg Hx    Arrhythmia Neg Hx     Social History   Tobacco Use   Smoking status: Former    Types: Cigarettes    Quit date: 12/29/2009    Years since quitting: 11.7   Smokeless tobacco: Never  Vaping Use   Vaping Use: Never used  Substance Use Topics   Alcohol use: No    Alcohol/week: 0.0 standard drinks   Drug use: No    Home Medications Prior to Admission medications   Medication Sig Start Date End Date Taking? Authorizing Provider  acetaminophen (TYLENOL) 325 MG tablet Take 1 tablet (325 mg total) by mouth every 6 (six) hours as needed for moderate pain. 10/10/20   Placido Sou, PA-C  apixaban (ELIQUIS) 2.5 MG TABS tablet Take 1 tablet (2.5 mg total) by mouth 2 (two) times daily. Patient not taking: No sig  reported 08/16/21   Little Ishikawa, MD  aspirin 325 MG tablet Take 325 mg by mouth daily.    [provider]  Calcium Carbonate-Vitamin D3 600-400 MG-UNIT TABS Take 2 tablets by mouth every morning.    [provider]  cephALEXin (KEFLEX) 500 MG capsule Take 1 capsule (500 mg total) by mouth 4 (four) times daily for 7 days. 09/24/21 10/01/21  Rayna Sexton, PA-C  docusate sodium (COLACE) 100 MG capsule Take 1 capsule (100 mg total) by mouth every 12 (twelve) hours. Patient taking differently: Take 100 mg by mouth See admin instructions. 100mg  every 12 hours  AND 100mg  daily as needed for constipation 12/27/16   Ward, Delice Bison, DO  Melatonin 3 MG TABS Take 3 mg by mouth at bedtime.    [provider]  memantine (NAMENDA) 10 MG tablet Take 10 mg by mouth 2 (two) times daily.    [provider]  metoprolol tartrate (LOPRESSOR) 25 MG tablet Take 0.5 tablets (12.5 mg total) by mouth 2 (two) times daily. Patient taking differently: Take 25 mg by mouth 2 (two) times daily. 08/16/21   Little Ishikawa, MD  sertraline (ZOLOFT) 100 MG tablet Take 100 mg by mouth at bedtime.    [provider]  simvastatin (ZOCOR) 20 MG tablet Take 1 tablet (20 mg total) by mouth daily at 6 PM. Patient not taking: No sig reported 08/19/21   Little Ishikawa, MD  vitamin B-12 (CYANOCOBALAMIN) 1000 MCG tablet Take 1,000 mcg by mouth 2 (two) times daily.    [provider]    Allergies    Sulfa antibiotics and Latex  Review of Systems   Review of Systems  Unable to perform ROS: Dementia   Physical Exam Updated Vital Signs BP (!) 152/72 (BP Location: Right Arm)   Pulse 63   Temp 97.6 F (36.4 C) (Oral)   Resp 18   Ht 1.626 m (5\' 4" )   Wt 74 kg   SpO2 99%   BMI 28.00 kg/m   Physical Exam Vitals and nursing note reviewed.  Constitutional:      General: She is not in acute distress.    Appearance: Normal appearance. She is well-developed. She is not toxic-appearing.  HENT:     Head: Normocephalic and atraumatic.  Eyes:     General: Lids are normal.     Conjunctiva/sclera: Conjunctivae normal.     Pupils: Pupils are equal, round, and reactive to light.  Neck:     Thyroid: No thyroid mass.     Trachea: No tracheal deviation.  Cardiovascular:     Rate and Rhythm: Normal rate and regular rhythm.     Heart sounds: Normal heart sounds. No murmur heard.   No gallop.  Pulmonary:     Effort: Pulmonary effort is normal. No respiratory distress.     Breath sounds: Normal breath sounds. No stridor. No decreased breath sounds, wheezing, rhonchi or rales.  Abdominal:     General: There is no distension.     Palpations: Abdomen is soft.     Tenderness: There is no abdominal tenderness. There is no rebound.  Musculoskeletal:     Cervical back: Normal range of  motion and neck supple.       Back:     Right knee: No deformity or effusion. Decreased range of motion. Tenderness present.     Comments: Right lower extremity shortened and internally rotated.  Skin:    General: Skin is warm and dry.  Findings: No abrasion or rash.  Neurological:     Mental Status: She is alert and oriented to person, place, and time. Mental status is at baseline.     GCS: GCS eye subscore is 4. GCS verbal subscore is 5. GCS motor subscore is 6.     Cranial Nerves: No cranial nerve deficit.     Sensory: No sensory deficit.     Motor: Motor function is intact.  Psychiatric:        Attention and Perception: Attention normal.        Speech: Speech normal.        Behavior: Behavior normal.    ED Results / Procedures / Treatments   Labs (all labs ordered are listed, but only abnormal results are displayed) Labs Reviewed  CBC WITH DIFFERENTIAL/PLATELET  BASIC METABOLIC PANEL  URINALYSIS, ROUTINE W REFLEX MICROSCOPIC    EKG None  Radiology DG Chest 2 View  Result Date: 09/24/2021 CLINICAL DATA:  87 year old female with history of trauma from a fall today. Altered mental status. EXAM: CHEST - 2 VIEW COMPARISON:  Chest x-ray 09/22/2021. FINDINGS: Lung volumes are normal. No consolidative airspace disease. No pleural effusions. No pneumothorax. No pulmonary nodule or mass noted. Pulmonary vasculature and the cardiomediastinal silhouette are within normal limits. Atherosclerosis in the thoracic aorta. IMPRESSION: 1.  No radiographic evidence of acute cardiopulmonary disease. 2. Aortic atherosclerosis. Electronically Signed   By: Vinnie Langton M.D.   On: 09/24/2021 10:40   DG Pelvis 1-2 Views  Result Date: 09/24/2021 CLINICAL DATA:  Fall today.  Left hip pain. EXAM: PELVIS - 1-2 VIEW COMPARISON:  03/19/2021 FINDINGS: There is no evidence of pelvic fracture or diastasis. No pelvic bone lesions are seen. IMPRESSION: Negative. Electronically Signed   By: Lajean Manes  M.D.   On: 09/24/2021 10:45   CT HEAD WO CONTRAST (5MM)  Result Date: 09/24/2021 CLINICAL DATA:  Head trauma, mod-severe. Unwitnessed fall, found down this morning. EXAM: CT HEAD WITHOUT CONTRAST TECHNIQUE: Contiguous axial images were obtained from the base of the skull through the vertex without intravenous contrast. COMPARISON:  09/22/2021 head CT. FINDINGS: Brain: Generalized cerebral volume loss. Nonspecific moderate subcortical and periventricular white matter hypodensity, most in keeping with chronic small vessel ischemic change. No evidence of parenchymal hemorrhage or extra-axial fluid collection. No mass lesion, mass effect, or midline shift. No CT evidence of acute infarction. Cerebral ventricle sizes are stable and concordant with the degree of cerebral volume loss. Vascular: No acute abnormality. Skull: Mild anterior frontal scalp contusion, new. No evidence of calvarial fracture. Sinuses/Orbits: The visualized paranasal sinuses are essentially clear. Other:  The mastoid air cells are unopacified. IMPRESSION: 1. Mild anterior frontal scalp contusion, new. No evidence of calvarial fracture. 2. No evidence of acute intracranial abnormality. 3. Generalized cerebral volume loss and moderate chronic small vessel ischemic changes in the cerebral white matter. Electronically Signed   By: Ilona Sorrel M.D.   On: 09/24/2021 10:49   CT Cervical Spine Wo Contrast  Result Date: 09/24/2021 CLINICAL DATA:  Neck trauma, dangerous injury mechanism (Age 35-64y). Unwitnessed fall. Found on this morning. EXAM: CT CERVICAL SPINE WITHOUT CONTRAST TECHNIQUE: Multidetector CT imaging of the cervical spine was performed without intravenous contrast. Multiplanar CT image reconstructions were also generated. COMPARISON:  09/22/2021 cervical spine CT. FINDINGS: Alignment: Straightening of the cervical spine. No facet subluxation. Dens is well positioned between the lateral masses of C1. Skull base and vertebrae: No acute  fracture. No primary bone lesion or focal pathologic process.  Soft tissues and spinal canal: No prevertebral edema. No visible canal hematoma. Disc levels: Moderate multilevel cervical degenerative disc disease, most prominent at C6-7. Mild-to-moderate bilateral cervical facet arthropathy. Moderate degenerative foraminal stenosis on the right at C3-4. Mild degenerative foraminal stenosis bilaterally at C5-6 and C6-7. Upper chest: No acute abnormality. Other: Visualized mastoid air cells appear clear. No discrete thyroid nodules. No pathologically enlarged cervical nodes. IMPRESSION: 1. No cervical spine fracture or subluxation. 2. Moderate multilevel cervical degenerative changes as detailed. Electronically Signed   By: Ilona Sorrel M.D.   On: 09/24/2021 10:53   DG Hand Complete Right  Result Date: 09/24/2021 CLINICAL DATA:  Fall today.  Right hand pain and swelling. EXAM: RIGHT HAND - COMPLETE 3+ VIEW COMPARISON:  None. FINDINGS: Fracture of the distal phalanx of the right thumb. There is an oblique transverse fracture component across the mid shaft to base and a longitudinal component extending to the distal tuft. No fracture displacement and no angulation. No other fractures.  No bone lesions.  Joints are normally aligned. There are osteoarthritic changes involving the interphalangeal joints, most prominently the second and third DIP joints. Skeletal structures are demineralized. Soft tissue swelling noted of the thumb. IMPRESSION: 1. Comminuted, non displaced and non angulated fracture of the distal phalanx of the right thumb. Electronically Signed   By: Lajean Manes M.D.   On: 09/24/2021 10:44    Procedures Procedures   Medications Ordered in ED Medications  morphine 2 MG/ML injection 2 mg (2 mg Intravenous Given 09/26/21 0731)    ED Course  I have reviewed the triage vital signs and the nursing notes.  Pertinent labs & imaging results that were available during my care of the patient were  reviewed by me and considered in my medical decision making (see chart for details).    MDM Rules/Calculators/A&P                           Patient treated for pain here and has evidence of right-sided hip fracture.  Discussed with Dr. Percell Miller who requested patient be transferred to [facility] to have surgery.  Will admit to the hospitalist service Final Clinical Impression(s) / ED Diagnoses Final diagnoses:  Preop cardiovascular exam    Rx / DC Orders ED Discharge Orders     None        Lacretia Leigh, MD 09/26/21 1013

## 2021-09-26 NOTE — Plan of Care (Signed)
Patient admitted from [facility] via [facility]. Confused and very anxious.  Oriented to room and made comfortable. No concerns at present.

## 2021-09-27 ENCOUNTER — Inpatient Hospital Stay (HOSPITAL_COMMUNITY): Payer: Medicare Other

## 2021-09-27 ENCOUNTER — Encounter (HOSPITAL_COMMUNITY): Payer: Self-pay | Admitting: Certified Registered Nurse Anesthetist

## 2021-09-27 ENCOUNTER — Inpatient Hospital Stay (HOSPITAL_COMMUNITY): Payer: Medicare Other | Admitting: Certified Registered Nurse Anesthetist

## 2021-09-27 ENCOUNTER — Encounter (HOSPITAL_COMMUNITY)
Admission: EM | Disposition: A | Discharge status: Skilled Nursing Facility | Payer: Self-pay | Source: Home / Self Care | Attending: Internal Medicine

## 2021-09-27 HISTORY — PX: INTRAMEDULLARY (IM) NAIL INTERTROCHANTERIC: SHX5875

## 2021-09-27 LAB — GLUCOSE, CAPILLARY
Glucose-Capillary: 107 mg/dL — ABNORMAL HIGH (ref 70–99)
Glucose-Capillary: 115 mg/dL — ABNORMAL HIGH (ref 70–99)
Glucose-Capillary: 133 mg/dL — ABNORMAL HIGH (ref 70–99)
Glucose-Capillary: 128 mg/dL — ABNORMAL HIGH (ref 70–99)
Glucose-Capillary: 243 mg/dL — ABNORMAL HIGH (ref 70–99)

## 2021-09-27 LAB — SURGICAL PCR SCREEN
MRSA, PCR: NEGATIVE
Staphylococcus aureus: NEGATIVE

## 2021-09-27 LAB — VITAMIN D 25 HYDROXY (VIT D DEFICIENCY, FRACTURES): Vit D, 25-Hydroxy: 15.74 ng/mL — ABNORMAL LOW (ref 30–100)

## 2021-09-27 SURGERY — FIXATION, FRACTURE, INTERTROCHANTERIC, WITH INTRAMEDULLARY ROD
Anesthesia: General | Site: Hip | Laterality: Right

## 2021-09-27 MED ORDER — ONDANSETRON HCL 4 MG/2ML IJ SOLN
INTRAMUSCULAR | Status: AC
  Filled 2021-09-27: qty 2

## 2021-09-27 MED ORDER — LIDOCAINE 2% (20 MG/ML) 5 ML SYRINGE
INTRAMUSCULAR | Status: AC
  Filled 2021-09-27: qty 5

## 2021-09-27 MED ORDER — METOCLOPRAMIDE HCL 5 MG/ML IJ SOLN: 5.0000 mg | Freq: Three times a day (TID) | INTRAMUSCULAR | Status: DC | PRN

## 2021-09-27 MED ORDER — ONDANSETRON HCL 4 MG/2ML IJ SOLN: 4.0000 mg | Freq: Once | INTRAMUSCULAR | Status: DC | PRN

## 2021-09-27 MED ORDER — ENSURE ENLIVE PO LIQD
237.0000 mL | Freq: Two times a day (BID) | ORAL | Status: DC
  Administered 2021-09-28 – 2021-09-30 (×4): 237 mL via ORAL
  Filled 2021-09-27: qty 237

## 2021-09-27 MED ORDER — FENTANYL CITRATE (PF) 250 MCG/5ML IJ SOLN
INTRAMUSCULAR | Status: DC | PRN
  Administered 2021-09-27 (×2): 50 ug via INTRAVENOUS

## 2021-09-27 MED ORDER — SODIUM CHLORIDE 0.9 % IV SOLN: INTRAVENOUS | Status: DC

## 2021-09-27 MED ORDER — VANCOMYCIN HCL 1000 MG IV SOLR
INTRAVENOUS | Status: AC
  Filled 2021-09-27: qty 20

## 2021-09-27 MED ORDER — ONDANSETRON HCL 4 MG/2ML IJ SOLN: 4.0000 mg | Freq: Four times a day (QID) | INTRAMUSCULAR | Status: DC | PRN

## 2021-09-27 MED ORDER — ORAL CARE MOUTH RINSE: 15.0000 mL | Freq: Once | OROMUCOSAL | Status: AC

## 2021-09-27 MED ORDER — ROCURONIUM BROMIDE 10 MG/ML (PF) SYRINGE
PREFILLED_SYRINGE | INTRAVENOUS | Status: AC
  Filled 2021-09-27: qty 10

## 2021-09-27 MED ORDER — SUGAMMADEX SODIUM 200 MG/2ML IV SOLN
INTRAVENOUS | Status: DC | PRN
Start: 2021-09-27 — End: 2021-09-27
  Administered 2021-09-27: 200 mg via INTRAVENOUS
  Administered 2021-09-27: 100 mg via INTRAVENOUS

## 2021-09-27 MED ORDER — DOCUSATE SODIUM 100 MG PO CAPS
100.0000 mg | ORAL_CAPSULE | Freq: Two times a day (BID) | ORAL | Status: DC
  Administered 2021-09-27 – 2021-09-30 (×6): 100 mg via ORAL
  Filled 2021-09-27 (×6): qty 1

## 2021-09-27 MED ORDER — CHLORHEXIDINE GLUCONATE 0.12 % MT SOLN: 15.0000 mL | Freq: Once | OROMUCOSAL | Status: AC

## 2021-09-27 MED ORDER — DIPHENHYDRAMINE HCL 12.5 MG/5ML PO ELIX: 12.5000 mg | ORAL_SOLUTION | ORAL | Status: DC | PRN

## 2021-09-27 MED ORDER — CHLORHEXIDINE GLUCONATE 0.12 % MT SOLN
OROMUCOSAL | Status: AC
  Administered 2021-09-27: 15 mL via OROMUCOSAL
  Filled 2021-09-27: qty 15

## 2021-09-27 MED ORDER — DEXAMETHASONE SODIUM PHOSPHATE 10 MG/ML IJ SOLN
INTRAMUSCULAR | Status: AC
  Filled 2021-09-27: qty 1

## 2021-09-27 MED ORDER — PHENYLEPHRINE 40 MCG/ML (10ML) SYRINGE FOR IV PUSH (FOR BLOOD PRESSURE SUPPORT)
PREFILLED_SYRINGE | INTRAVENOUS | Status: DC | PRN
  Administered 2021-09-27 (×3): 80 ug via INTRAVENOUS
  Administered 2021-09-27: 120 ug via INTRAVENOUS

## 2021-09-27 MED ORDER — LACTATED RINGERS IV SOLN: INTRAVENOUS | Status: DC

## 2021-09-27 MED ORDER — FENTANYL CITRATE (PF) 250 MCG/5ML IJ SOLN
INTRAMUSCULAR | Status: AC
  Filled 2021-09-27: qty 5

## 2021-09-27 MED ORDER — FENTANYL CITRATE (PF) 100 MCG/2ML IJ SOLN: 25.0000 ug | INTRAMUSCULAR | Status: DC | PRN

## 2021-09-27 MED ORDER — OXYCODONE HCL 5 MG/5ML PO SOLN: 5.0000 mg | Freq: Once | ORAL | Status: DC | PRN

## 2021-09-27 MED ORDER — EPHEDRINE SULFATE-NACL 50-0.9 MG/10ML-% IV SOSY
PREFILLED_SYRINGE | INTRAVENOUS | Status: DC | PRN
  Administered 2021-09-27: 5 mg via INTRAVENOUS

## 2021-09-27 MED ORDER — TRANEXAMIC ACID-NACL 1000-0.7 MG/100ML-% IV SOLN
1000.0000 mg | Freq: Once | INTRAVENOUS | Status: AC
  Administered 2021-09-27: 1000 mg via INTRAVENOUS
  Filled 2021-09-27: qty 100

## 2021-09-27 MED ORDER — ROCURONIUM BROMIDE 10 MG/ML (PF) SYRINGE
PREFILLED_SYRINGE | INTRAVENOUS | Status: DC | PRN
  Administered 2021-09-27: 60 mg via INTRAVENOUS

## 2021-09-27 MED ORDER — 0.9 % SODIUM CHLORIDE (POUR BTL) OPTIME
TOPICAL | Status: DC | PRN
  Administered 2021-09-27: 1000 mL

## 2021-09-27 MED ORDER — ONDANSETRON HCL 4 MG/2ML IJ SOLN
INTRAMUSCULAR | Status: DC | PRN
  Administered 2021-09-27: 4 mg via INTRAVENOUS

## 2021-09-27 MED ORDER — PHENYLEPHRINE HCL-NACL 20-0.9 MG/250ML-% IV SOLN
INTRAVENOUS | Status: DC | PRN
  Administered 2021-09-27: 75 ug/min via INTRAVENOUS

## 2021-09-27 MED ORDER — DEXAMETHASONE SODIUM PHOSPHATE 10 MG/ML IJ SOLN
INTRAMUSCULAR | Status: DC | PRN
  Administered 2021-09-27: 10 mg via INTRAVENOUS

## 2021-09-27 MED ORDER — POLYETHYLENE GLYCOL 3350 17 G PO PACK
17.0000 g | PACK | Freq: Every day | ORAL | Status: DC | PRN
  Administered 2021-09-30: 17 g via ORAL
  Filled 2021-09-27: qty 1

## 2021-09-27 MED ORDER — EPHEDRINE 5 MG/ML INJ
INTRAVENOUS | Status: AC
  Filled 2021-09-27: qty 5

## 2021-09-27 MED ORDER — CEFAZOLIN SODIUM-DEXTROSE 2-4 GM/100ML-% IV SOLN
2.0000 g | Freq: Three times a day (TID) | INTRAVENOUS | Status: AC
  Administered 2021-09-27 – 2021-09-28 (×3): 2 g via INTRAVENOUS
  Filled 2021-09-27 (×3): qty 100

## 2021-09-27 MED ORDER — LACTATED RINGERS IV SOLN: INTRAVENOUS | Status: DC | PRN

## 2021-09-27 MED ORDER — PROPOFOL 10 MG/ML IV BOLUS
INTRAVENOUS | Status: DC | PRN
  Administered 2021-09-27: 80 mg via INTRAVENOUS

## 2021-09-27 MED ORDER — OXYCODONE HCL 5 MG PO TABS: 5.0000 mg | ORAL_TABLET | Freq: Once | ORAL | Status: DC | PRN

## 2021-09-27 MED ORDER — ONDANSETRON HCL 4 MG PO TABS: 4.0000 mg | ORAL_TABLET | Freq: Four times a day (QID) | ORAL | Status: DC | PRN

## 2021-09-27 MED ORDER — ACETAMINOPHEN 325 MG PO TABS
325.0000 mg | ORAL_TABLET | Freq: Four times a day (QID) | ORAL | Status: DC | PRN
  Administered 2021-09-28 – 2021-09-30 (×3): 650 mg via ORAL
  Filled 2021-09-27: qty 1
  Filled 2021-09-27 (×3): qty 2

## 2021-09-27 MED ORDER — METOCLOPRAMIDE HCL 5 MG PO TABS: 5.0000 mg | ORAL_TABLET | Freq: Three times a day (TID) | ORAL | Status: DC | PRN

## 2021-09-27 MED ORDER — LIDOCAINE 2% (20 MG/ML) 5 ML SYRINGE
INTRAMUSCULAR | Status: DC | PRN
  Administered 2021-09-27: 60 mg via INTRAVENOUS

## 2021-09-27 SURGICAL SUPPLY — 49 items
BAG COUNTER SPONGE SURGICOUNT (BAG) IMPLANT
BIT DRILL INTERTAN LAG SCREW (BIT) ×1 IMPLANT
BIT DRILL LONG 4.0 (BIT) IMPLANT
BRUSH SCRUB EZ PLAIN DRY (MISCELLANEOUS) ×4 IMPLANT
CHLORAPREP W/TINT 26 (MISCELLANEOUS) ×2 IMPLANT
COVER PERINEAL POST (MISCELLANEOUS) ×2 IMPLANT
COVER SURGICAL LIGHT HANDLE (MISCELLANEOUS) ×2 IMPLANT
DERMABOND ADVANCED (GAUZE/BANDAGES/DRESSINGS) ×1
DERMABOND ADVANCED .7 DNX12 (GAUZE/BANDAGES/DRESSINGS) ×1 IMPLANT
DRAPE C-ARM 35X43 STRL (DRAPES) ×2 IMPLANT
DRAPE IMP U-DRAPE 54X76 (DRAPES) ×4 IMPLANT
DRAPE INCISE IOBAN 66X45 STRL (DRAPES) ×2 IMPLANT
DRAPE STERI IOBAN 125X83 (DRAPES) ×2 IMPLANT
DRAPE SURG 17X23 STRL (DRAPES) ×4 IMPLANT
DRAPE U-SHAPE 47X51 STRL (DRAPES) ×2 IMPLANT
DRESSING MEPILEX FLEX 4X4 (GAUZE/BANDAGES/DRESSINGS) IMPLANT
DRILL BIT LONG 4.0 (BIT) ×2
DRILL SURG 7.0 QC (BIT) ×1 IMPLANT
DRSG MEPILEX BORDER 4X4 (GAUZE/BANDAGES/DRESSINGS) ×2 IMPLANT
DRSG MEPILEX BORDER 4X8 (GAUZE/BANDAGES/DRESSINGS) ×2 IMPLANT
DRSG MEPILEX FLEX 4X4 (GAUZE/BANDAGES/DRESSINGS) ×2
ELECT REM PT RETURN 9FT ADLT (ELECTROSURGICAL) ×2
ELECTRODE REM PT RTRN 9FT ADLT (ELECTROSURGICAL) ×1 IMPLANT
GLOVE SURG ENC MOIS LTX SZ6.5 (GLOVE) ×3 IMPLANT
GLOVE SURG ENC MOIS LTX SZ7.5 (GLOVE) ×4 IMPLANT
GLOVE SURG UNDER POLY LF SZ6.5 (GLOVE) ×2 IMPLANT
GLOVE SURG UNDER POLY LF SZ7.5 (GLOVE) ×2 IMPLANT
GOWN STRL REUS W/ TWL LRG LVL3 (GOWN DISPOSABLE) ×1 IMPLANT
GOWN STRL REUS W/TWL LRG LVL3 (GOWN DISPOSABLE) ×2
GUIDE PIN 3.2X343 (PIN) ×2
GUIDE PIN 3.2X343MM (PIN) ×4
KIT BASIN OR (CUSTOM PROCEDURE TRAY) ×2 IMPLANT
KIT TURNOVER KIT B (KITS) ×2 IMPLANT
MANIFOLD NEPTUNE II (INSTRUMENTS) ×2 IMPLANT
NAIL INTERTAN 10X18 130D 10S (Nail) ×1 IMPLANT
NS IRRIG 1000ML POUR BTL (IV SOLUTION) ×2 IMPLANT
PACK GENERAL/GYN (CUSTOM PROCEDURE TRAY) ×2 IMPLANT
PAD ARMBOARD 7.5X6 YLW CONV (MISCELLANEOUS) ×4 IMPLANT
PIN GUIDE 3.2X343MM (PIN) IMPLANT
SCREW LAG COMPR KIT 105/100 (Screw) ×1 IMPLANT
SCREW TRIGEN LOW PROF 5.0X37.5 (Screw) ×1 IMPLANT
SUT MNCRL AB 3-0 PS2 18 (SUTURE) ×2 IMPLANT
SUT MNCRL AB 3-0 PS2 27 (SUTURE) ×1 IMPLANT
SUT VIC AB 0 CT1 27 (SUTURE) ×2
SUT VIC AB 0 CT1 27XBRD ANBCTR (SUTURE) IMPLANT
SUT VIC AB 2-0 CT1 27 (SUTURE) ×2
SUT VIC AB 2-0 CT1 TAPERPNT 27 (SUTURE) ×2 IMPLANT
TOWEL GREEN STERILE (TOWEL DISPOSABLE) ×4 IMPLANT
WATER STERILE IRR 1000ML POUR (IV SOLUTION) ×1 IMPLANT

## 2021-09-27 NOTE — H&P (View-Only) (Signed)
Reason for Consult:Right hip fx Referring Physician: [place] Highland Time called: 0745 Time at bedside: [name] is an 87 y.o. female.  HPI: [name] fell at the SN[facility] where she resides. The fall was unwitnessed. She was brought to the ED where x-rays showed a right hip fx and orthopedic surgery was consulted. She is demented and cannot contribute meaningfully to history. Due to scheduling issues orthopedic trauma was asked to take over care.  Past Medical History:  Diagnosis Date   Allergies    Alzheimer disease ([place])    STABLE   Anemia, iron deficiency 12/12/2011   Arthritis    RF   CKD (chronic kidney disease)    STAGE 3   Diabetes mellitus    no mes, diet only    Diarrhea    RESOLVED   Hypercholesterolemia    Hyperlipemia    Hypertension    Major depression    OA (osteoarthritis)    HAND/LEFT KNEE PAIN MILD   Osteoporosis    Syncope     Past Surgical History:  Procedure Laterality Date   left knee arthroscopy     ORIF HUMERUS FRACTURE  12/09/2011   Procedure: OPEN REDUCTION INTERNAL FIXATION (ORIF) DISTAL HUMERUS FRACTURE;  Surgeon: Linna Hoff, MD;  Location: [facility];  Service: Orthopedics;  Laterality: Right;   OTHER SURGICAL HISTORY     left small toe bone spur removed    TONSILLECTOMY      Family History  Problem Relation Age of Onset   Heart Problems Mother    Heart attack Maternal Grandmother        died from a "heart attack"   Stroke Neg Hx    Arrhythmia Neg Hx     Social History:  reports that she quit smoking about 11 years ago. Her smoking use included cigarettes. She has never used smokeless tobacco. She reports that she does not drink alcohol and does not use drugs.  Allergies:  Allergies  Allergen Reactions   Sulfa Antibiotics Hives, Diarrhea, Nausea And Vomiting and Swelling    Facial swelling ALSO   Latex Rash    Medications: I have reviewed the patient's current medications.  Results for orders placed or performed during the  hospital encounter of 09/26/21 (from the past 48 hour(s))  CBC with Differential/Platelet     Status: Abnormal   Collection Time: 09/26/21  7:42 AM  Result Value Ref Range   WBC 11.5 (H) 4.0 - 10.5 K/uL   RBC 4.41 3.87 - 5.11 MIL/uL   Hemoglobin 12.6 12.0 - 15.0 g/dL   HCT 40.1 36.0 - 46.0 %   MCV 90.9 80.0 - 100.0 fL   MCH 28.6 26.0 - 34.0 pg   MCHC 31.4 30.0 - 36.0 g/dL   RDW 13.7 11.5 - 15.5 %   Platelets 212 150 - 400 K/uL   nRBC 0.0 0.0 - 0.2 %   Neutrophils Relative % 81 %   Neutro Abs 9.3 (H) 1.7 - 7.7 K/uL   Lymphocytes Relative 10 %   Lymphs Abs 1.1 0.7 - 4.0 K/uL   Monocytes Relative 6 %   Monocytes Absolute 0.7 0.1 - 1.0 K/uL   Eosinophils Relative 2 %   Eosinophils Absolute 0.2 0.0 - 0.5 K/uL   Basophils Relative 0 %   Basophils Absolute 0.0 0.0 - 0.1 K/uL   Immature Granulocytes 1 %   Abs Immature Granulocytes 0.10 (H) 0.00 - 0.07 K/uL    Comment: Performed at [facility], [place] [address] [place], [place] [id number]  Basic metabolic panel     Status: Abnormal   Collection Time: 09/26/21  7:42 AM  Result Value Ref Range   Sodium 138 135 - 145 mmol/L   Potassium 3.5 3.5 - 5.1 mmol/L   Chloride 104 98 - 111 mmol/L   CO2 27 22 - 32 mmol/L   Glucose, Bld 160 (H) 70 - 99 mg/dL    Comment: Glucose reference range applies only to samples taken after fasting for at least 8 hours.   BUN 12 8 - 23 mg/dL   Creatinine, Ser 0.96 0.44 - 1.00 mg/dL   Calcium 8.8 (L) 8.9 - 10.3 mg/dL   GFR, Estimated 57 (L) >60 mL/min    Comment: (NOTE) Calculated using the CKD-EPI Creatinine Equation (2021)    Anion gap 7 5 - 15    Comment: Performed at [facility], [place] [address] [place], [place] [address]  Resp Panel by RT-PCR (Flu A&B, Covid) Nasopharyngeal Swab     Status: None   Collection Time: 09/26/21 10:32 AM   Specimen: Nasopharyngeal Swab; Nasopharyngeal(NP) swabs in vial transport medium  Result Value Ref Range   SARS Coronavirus 2 by  RT PCR NEGATIVE NEGATIVE    Comment: (NOTE) SARS-CoV-2 target nucleic acids are NOT DETECTED.  The SARS-CoV-2 RNA is generally detectable in upper respiratory specimens during the acute phase of infection. The lowest concentration of SARS-CoV-2 viral copies this assay can detect is 138 copies/mL. A negative result does not preclude SARS-Cov-2 infection and should not be used as the sole basis for treatment or other patient management decisions. A negative result may occur with  improper specimen collection/handling, submission of specimen other than nasopharyngeal swab, presence of viral mutation(s) within the areas targeted by this assay, and inadequate number of viral copies(<138 copies/mL). A negative result must be combined with clinical observations, patient history, and epidemiological information. The expected result is Negative.  Fact Sheet for Patients:  EntrepreneurPulse.com.au  Fact Sheet for Healthcare Providers:  IncredibleEmployment.be  This test is no t yet approved or cleared by the [place] FDA and  has been authorized for detection and/or diagnosis of SARS-CoV-2 by FDA under an Emergency Use Authorization (EUA). This EUA will remain  in effect (meaning this test can be used) for the duration of the COVID-19 declaration under Section 564(b)(1) of the Act, 21 U.S.C.section 360bbb-3(b)(1), unless the authorization is terminated  or revoked sooner.       Influenza A by PCR NEGATIVE NEGATIVE   Influenza B by PCR NEGATIVE NEGATIVE    Comment: (NOTE) The Xpert Xpress SARS-CoV-2/FLU/RSV plus assay is intended as an aid in the diagnosis of influenza from Nasopharyngeal swab specimens and should not be used as a sole basis for treatment. Nasal washings and aspirates are unacceptable for Xpert Xpress SARS-CoV-2/FLU/RSV testing.  Fact Sheet for Patients: EntrepreneurPulse.com.au  Fact Sheet for Healthcare  Providers: IncredibleEmployment.be  This test is not yet approved or cleared by the [place] FDA and has been authorized for detection and/or diagnosis of SARS-CoV-2 by FDA under an Emergency Use Authorization (EUA). This EUA will remain in effect (meaning this test can be used) for the duration of the COVID-19 declaration under Section 564(b)(1) of the Act, 21 U.S.C. section 360bbb-3(b)(1), unless the authorization is terminated or revoked.  Performed at [facility], [place] [address] [place], [place] [address]   Magnesium     Status: None   Collection Time: 09/26/21  6:23 PM  Result Value Ref Range   Magnesium 2.0 1.7 - 2.4 mg/dL    Comment: Performed at [facility], [place] [address], [place], [place] [address]  Phosphorus     Status: Abnormal   Collection Time: 09/26/21  6:23 PM  Result Value Ref Range   Phosphorus 5.1 (H) 2.5 - 4.6 mg/dL    Comment: Performed at [place] [address] [place], [place] [address]  Glucose, capillary     Status: Abnormal   Collection Time: 09/26/21  8:30 PM  Result Value Ref Range   Glucose-Capillary 124 (H) 70 - 99 mg/dL    Comment: Glucose reference range applies only to samples taken after fasting for at least 8 hours.  Surgical pcr screen     Status: None   Collection Time: 09/26/21 10:44 PM   Specimen: Nasal Mucosa; Nasal Swab  Result Value Ref Range   MRSA, PCR NEGATIVE NEGATIVE   Staphylococcus aureus NEGATIVE NEGATIVE    Comment: (NOTE) The Xpert SA Assay (FDA approved for NASAL specimens in patients 68 years of age and older), is one component of a comprehensive surveillance program. It is not intended to diagnose infection nor to guide or monitor treatment. Performed at [facility], [place] [address] [place], [place] [address]   Glucose, capillary     Status: Abnormal   Collection Time: 09/27/21  7:31 AM  Result Value Ref Range   Glucose-Capillary 107 (H) 70 - 99  mg/dL    Comment: Glucose reference range applies only to samples taken after fasting for at least 8 hours.    DG Chest 1 View  Result Date: 09/26/2021 CLINICAL DATA:  Fall this morning. Right hip fracture. Pre-op clearance exam EXAM: CHEST  1 VIEW COMPARISON:  None. FINDINGS: The heart size and mediastinal contours are within normal limits. Aortic atherosclerotic calcification noted. Both lungs are clear. The visualized skeletal structures are unremarkable. IMPRESSION: No active disease. Electronically Signed   By: Marlaine Hind M.D.   On: 09/26/2021 08:50   DG Lumbar Spine 2-3 Views  Result Date: 09/26/2021 CLINICAL DATA:  Fall this morning. Low back pain. Initial encounter. EXAM: LUMBAR SPINE - 2-3 VIEW COMPARISON:  None. FINDINGS: Posterior elements are not well visualized on the cross-table lateral projection due to artifact from the patient's mattress and bending. There is no evidence of lumbar vertebral body fracture. Alignment is normal. Intervertebral disc spaces are maintained. Lower lumbar facet DJD is noted at L4-5 and L5-S1 bilaterally. Aortic atherosclerotic calcification noted. IMPRESSION: No acute findings. Lower lumbar facet DJD. Electronically Signed   By: Marlaine Hind M.D.   On: 09/26/2021 08:49   DG Knee 1-2 Views Right  Result Date: 09/26/2021 CLINICAL DATA:  Fall this morning. Right knee pain. Initial encounter. EXAM: RIGHT KNEE - 2 VIEW COMPARISON:  None. FINDINGS: No evidence of fracture, dislocation, or joint effusion. Mild osteoarthritis is seen involving the medial compartment. Enthesopathic changes are seen involving the patella. No other osseous abnormality identified. IMPRESSION: No acute findings. Mild medial compartment osteoarthritis. Electronically Signed   By: Marlaine Hind M.D.   On: 09/26/2021 08:48   CT Head Wo Contrast  Result Date: 09/26/2021 CLINICAL DATA:  Fall this morning. Head and neck trauma. Initial encounter. EXAM: CT HEAD WITHOUT CONTRAST CT CERVICAL  SPINE WITHOUT CONTRAST TECHNIQUE: Multidetector CT imaging of the head and cervical spine was performed following the standard protocol without intravenous contrast. Multiplanar CT image reconstructions of the cervical spine  were also generated. COMPARISON:  09/24/2021 FINDINGS: CT HEAD FINDINGS Brain: No evidence of acute infarction, hemorrhage, hydrocephalus, extra-axial collection, or mass lesion/mass effect. Stable moderate diffuse cerebral atrophy and chronic small vessel disease. Old infarcts are again seen involving the left cerebellum. Vascular:  No hyperdense vessel or other acute findings. Skull: No evidence of fracture or other significant bone abnormality. Sinuses/Orbits:  No acute findings. Other: None. CT CERVICAL SPINE FINDINGS Alignment: Normal. Skull base and vertebrae: No acute fracture. No primary bone lesion or focal pathologic process. Soft tissues and spinal canal: No prevertebral fluid or swelling. No visible canal hematoma. Disc levels: Mild-to-moderate degenerative disc disease is seen at all cervical levels. Mild bilateral facet DJD also noted. Upper chest: No acute findings. Other: None. IMPRESSION: No acute intracranial abnormality. 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 No evidence of acute cervical spine fracture or subluxation. Degenerative spondylosis, as described above. Electronically Signed   By: Danae Orleans M.D.   On: 09/26/2021 09:36   CT Cervical Spine Wo Contrast  Result Date: 09/26/2021 CLINICAL DATA:  Fall this morning. Head and neck trauma. Initial encounter. EXAM: CT HEAD WITHOUT CONTRAST CT CERVICAL SPINE WITHOUT CONTRAST TECHNIQUE: Multidetector CT imaging of the head and cervical spine was performed following the standard protocol without intravenous contrast. Multiplanar CT image reconstructions of the cervical spine were also generated. COMPARISON:  09/24/2021 FINDINGS: CT HEAD FINDINGS Brain: No evidence of acute  infarction, hemorrhage, hydrocephalus, extra-axial collection, or mass lesion/mass effect. Stable moderate diffuse cerebral atrophy and chronic small vessel disease. Old infarcts are again seen involving the left cerebellum. Vascular:  No hyperdense vessel or other acute findings. Skull: No evidence of fracture or other significant bone abnormality. Sinuses/Orbits:  No acute findings. Other: None. CT CERVICAL SPINE FINDINGS Alignment: Normal. Skull base and vertebrae: No acute fracture. No primary bone lesion or focal pathologic process. Soft tissues and spinal canal: No prevertebral fluid or swelling. No visible canal hematoma. Disc levels: Mild-to-moderate degenerative disc disease is seen at all cervical levels. Mild bilateral facet DJD also noted. Upper chest: No acute findings. Other: None. IMPRESSION: No acute intracranial abnormality. 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 No evidence of acute cervical spine fracture or subluxation. Degenerative spondylosis, as described above. Electronically Signed   By: Danae Orleans M.D.   On: 09/26/2021 09:36   DG Hip Unilat W or Wo Pelvis 2-3 Views Right  Result Date: 09/26/2021 CLINICAL DATA:  Fall this morning. Right hip pain. Initial encounter. EXAM: DG HIP (WITH OR WITHOUT PELVIS) 2-3V RIGHT COMPARISON:  None. FINDINGS: A comminuted displaced intratrochanteric fracture the right hip is seen with impaction and medial angulation of the distal fracture fragment. No evidence of dislocation. No pelvic fracture or bone lesions identified. IMPRESSION: Comminuted, displaced intratrochanteric right hip fracture. Electronically Signed   By: Danae Orleans M.D.   On: 09/26/2021 08:47    Review of Systems  Unable to perform ROS: Dementia  Blood pressure (!) 122/49, pulse 78, temperature 97.6 F (36.4 C), resp. rate 17, height 5\' 4"  (1.626 m), weight 74 kg, SpO2 96 %. Physical Exam Constitutional:      General: She is not  in acute distress.    Appearance: She is well-developed. She is not diaphoretic.  HENT:     Head: Normocephalic and atraumatic.  Eyes:     General: No scleral icterus.       Right eye: No discharge.  Left eye: No discharge.     Conjunctiva/sclera: Conjunctivae normal.  Cardiovascular:     Rate and Rhythm: Normal rate and regular rhythm.  Pulmonary:     Effort: Pulmonary effort is normal. No respiratory distress.  Musculoskeletal:     Cervical back: Normal range of motion.     Comments: RLE No traumatic wounds, ecchymosis, or rash  Mod TTP hip  No knee or ankle effusion  Knee stable to varus/ valgus and anterior/posterior stress  Sens DPN, SPN, TN intact  Motor EHL, ext, flex, evers 5/5  DP 2+, PT 1+, No significant edema  Skin:    General: Skin is warm and dry.  Neurological:     Mental Status: She is alert.  Psychiatric:        Mood and Affect: Mood normal.        Behavior: Behavior normal.    Assessment/Plan: Right hip fx -- Plan IMN today with Dr. Doreatha Martin. Please keep NPO. Multiple medical problems including CKD, Alzheimer's, HLD, HTN, osteoporosis, recent right thumb distal phalanx fracture -- per primary service. Thumb fx does not require splint.    Lisette Abu, PA-C Orthopedic Surgery [phone] 09/27/2021, 9:44 AM

## 2021-09-27 NOTE — Progress Notes (Addendum)
Initial Nutrition Assessment  DOCUMENTATION CODES:   Not applicable  INTERVENTION:  - Ensure Enlive po BID, each supplement provides 350 kcal and 20 grams of protein  NUTRITION DIAGNOSIS:   Increased nutrient needs related to hip fracture as evidenced by estimated needs.  GOAL:   Patient will meet greater than or equal to 90% of their needs  MONITOR:   Diet advancement, Labs, Weight trends  REASON FOR ASSESSMENT:   Consult Assessment of nutrition requirement/status (Hip/Femur fracture patient)  ASSESSMENT:   Pt admitted from [facility] facility after a fall causing LOC and R hip fracture. PMH includes CKDIII, HTN, T2DM, mild cognitive impairment, tachycardia, syncope and COVID.  IMN for R hip fx planned for 09/27/21  Pt unable to provide a detailed history. She states that she is unsure of how she was eating PTA however endorses feeling hungry. She is not aware of her current weight but does not think she has lost any recent weight as her clothes do not feel more loose than usual. There is a limited weight history documentation, however noted weight slowly down trending over time therefore not significant for time frame. Spoke with pt about nutrition supplement and she is amenable to receiving Ensure after surgery to ensure adequare intake of nutrition to promote healing. Will continue to monitor weight and PO intake trends.   Medications: mag-ox, sonokot, Vitamin B12   Labs: reviewed  NUTRITION - FOCUSED PHYSICAL EXAM:  Flowsheet Row Most Recent Value  Orbital Region No depletion  Upper Arm Region No depletion  Thoracic and Lumbar Region No depletion  Buccal Region No depletion  Temple Region No depletion  Clavicle Bone Region No depletion  Clavicle and Acromion Bone Region No depletion  Scapular Bone Region No depletion  Dorsal Hand No depletion  Patellar Region Mild depletion  Anterior Thigh Region Mild depletion  Posterior Calf Region Mild depletion  Edema  (RD Assessment) Mild  Hair Reviewed  Eyes Reviewed  Mouth Reviewed  Skin Other (Comment)  [R thumb bruising]  Nails Reviewed      Diet Order:   Diet Order             Diet NPO time specified  Diet effective ____                   EDUCATION NEEDS:   No education needs have been identified at this time  Skin:  Skin Assessment: Reviewed RN Assessment  Last BM:  PTA  Height:   Ht Readings from Last 1 Encounters:  09/27/21 5\' 4"  (1.626 m)    Weight:   Wt Readings from Last 1 Encounters:  09/27/21 74 kg    BMI:  Body mass index is 28 kg/m.  Estimated Nutritional Needs:   Kcal:  1600-1800  Protein:  90-100g  Fluid:  >1.6  13/07/22, RDN, LDN Clinical Nutrition

## 2021-09-27 NOTE — Consult Note (Signed)
Reason for Consult:Right hip fx Referring Physician: Fredonia Highland Time called: 0745 Time at bedside: [name] is an 87 y.o. female.  HPI: [name] fell at the SNF where she resides. The fall was unwitnessed. She was brought to the ED where x-rays showed a right hip fx and orthopedic surgery was consulted. She is demented and cannot contribute meaningfully to history. Due to scheduling issues orthopedic trauma was asked to take over care.  Past Medical History:  Diagnosis Date   Allergies    Alzheimer disease ([place])    STABLE   Anemia, iron deficiency 12/12/2011   Arthritis    RF   CKD (chronic kidney disease)    STAGE 3   Diabetes mellitus    no mes, diet only    Diarrhea    RESOLVED   Hypercholesterolemia    Hyperlipemia    Hypertension    Major depression    OA (osteoarthritis)    HAND/LEFT KNEE PAIN MILD   Osteoporosis    Syncope     Past Surgical History:  Procedure Laterality Date   left knee arthroscopy     ORIF HUMERUS FRACTURE  12/09/2011   Procedure: OPEN REDUCTION INTERNAL FIXATION (ORIF) DISTAL HUMERUS FRACTURE;  Surgeon: Linna Hoff, MD;  Location: [facility];  Service: Orthopedics;  Laterality: Right;   OTHER SURGICAL HISTORY     left small toe bone spur removed    TONSILLECTOMY      Family History  Problem Relation Age of Onset   Heart Problems Mother    Heart attack Maternal Grandmother        died from a "heart attack"   Stroke Neg Hx    Arrhythmia Neg Hx     Social History:  reports that she quit smoking about 11 years ago. Her smoking use included cigarettes. She has never used smokeless tobacco. She reports that she does not drink alcohol and does not use drugs.  Allergies:  Allergies  Allergen Reactions   Sulfa Antibiotics Hives, Diarrhea, Nausea And Vomiting and Swelling    Facial swelling ALSO   Latex Rash    Medications: I have reviewed the patient's current medications.  Results for orders placed or performed during the  hospital encounter of 09/26/21 (from the past 48 hour(s))  CBC with Differential/Platelet     Status: Abnormal   Collection Time: 09/26/21  7:42 AM  Result Value Ref Range   WBC 11.5 (H) 4.0 - 10.5 K/uL   RBC 4.41 3.87 - 5.11 MIL/uL   Hemoglobin 12.6 12.0 - 15.0 g/dL   HCT 40.1 36.0 - 46.0 %   MCV 90.9 80.0 - 100.0 fL   MCH 28.6 26.0 - 34.0 pg   MCHC 31.4 30.0 - 36.0 g/dL   RDW 13.7 11.5 - 15.5 %   Platelets 212 150 - 400 K/uL   nRBC 0.0 0.0 - 0.2 %   Neutrophils Relative % 81 %   Neutro Abs 9.3 (H) 1.7 - 7.7 K/uL   Lymphocytes Relative 10 %   Lymphs Abs 1.1 0.7 - 4.0 K/uL   Monocytes Relative 6 %   Monocytes Absolute 0.7 0.1 - 1.0 K/uL   Eosinophils Relative 2 %   Eosinophils Absolute 0.2 0.0 - 0.5 K/uL   Basophils Relative 0 %   Basophils Absolute 0.0 0.0 - 0.1 K/uL   Immature Granulocytes 1 %   Abs Immature Granulocytes 0.10 (H) 0.00 - 0.07 K/uL    Comment: Performed at [facility], [place] [address] [place], [place] [id number]  Basic metabolic panel     Status: Abnormal   Collection Time: 09/26/21  7:42 AM  Result Value Ref Range   Sodium 138 135 - 145 mmol/L   Potassium 3.5 3.5 - 5.1 mmol/L   Chloride 104 98 - 111 mmol/L   CO2 27 22 - 32 mmol/L   Glucose, Bld 160 (H) 70 - 99 mg/dL    Comment: Glucose reference range applies only to samples taken after fasting for at least 8 hours.   BUN 12 8 - 23 mg/dL   Creatinine, Ser 0.96 0.44 - 1.00 mg/dL   Calcium 8.8 (L) 8.9 - 10.3 mg/dL   GFR, Estimated 57 (L) >60 mL/min    Comment: (NOTE) Calculated using the CKD-EPI Creatinine Equation (2021)    Anion gap 7 5 - 15    Comment: Performed at [facility], [place] [address] [place], [place] [address]  Resp Panel by RT-PCR (Flu A&B, Covid) Nasopharyngeal Swab     Status: None   Collection Time: 09/26/21 10:32 AM   Specimen: Nasopharyngeal Swab; Nasopharyngeal(NP) swabs in vial transport medium  Result Value Ref Range   SARS Coronavirus 2 by  RT PCR NEGATIVE NEGATIVE    Comment: (NOTE) SARS-CoV-2 target nucleic acids are NOT DETECTED.  The SARS-CoV-2 RNA is generally detectable in upper respiratory specimens during the acute phase of infection. The lowest concentration of SARS-CoV-2 viral copies this assay can detect is 138 copies/mL. A negative result does not preclude SARS-Cov-2 infection and should not be used as the sole basis for treatment or other patient management decisions. A negative result may occur with  improper specimen collection/handling, submission of specimen other than nasopharyngeal swab, presence of viral mutation(s) within the areas targeted by this assay, and inadequate number of viral copies(<138 copies/mL). A negative result must be combined with clinical observations, patient history, and epidemiological information. The expected result is Negative.  Fact Sheet for Patients:  EntrepreneurPulse.com.au  Fact Sheet for Healthcare Providers:  IncredibleEmployment.be  This test is no t yet approved or cleared by the [place] FDA and  has been authorized for detection and/or diagnosis of SARS-CoV-2 by FDA under an Emergency Use Authorization (EUA). This EUA will remain  in effect (meaning this test can be used) for the duration of the COVID-19 declaration under Section 564(b)(1) of the Act, 21 U.S.C.section 360bbb-3(b)(1), unless the authorization is terminated  or revoked sooner.       Influenza A by PCR NEGATIVE NEGATIVE   Influenza B by PCR NEGATIVE NEGATIVE    Comment: (NOTE) The Xpert Xpress SARS-CoV-2/FLU/RSV plus assay is intended as an aid in the diagnosis of influenza from Nasopharyngeal swab specimens and should not be used as a sole basis for treatment. Nasal washings and aspirates are unacceptable for Xpert Xpress SARS-CoV-2/FLU/RSV testing.  Fact Sheet for Patients: EntrepreneurPulse.com.au  Fact Sheet for Healthcare  Providers: IncredibleEmployment.be  This test is not yet approved or cleared by the [place] FDA and has been authorized for detection and/or diagnosis of SARS-CoV-2 by FDA under an Emergency Use Authorization (EUA). This EUA will remain in effect (meaning this test can be used) for the duration of the COVID-19 declaration under Section 564(b)(1) of the Act, 21 U.S.C. section 360bbb-3(b)(1), unless the authorization is terminated or revoked.  Performed at [facility], [place] [address] [place], [place] [address]   Magnesium     Status: None   Collection Time: 09/26/21  6:23 PM  Result Value Ref Range   Magnesium 2.0 1.7 - 2.4 mg/dL    Comment: Performed at [facility], [place] [address], [place], [place] [address]  Phosphorus     Status: Abnormal   Collection Time: 09/26/21  6:23 PM  Result Value Ref Range   Phosphorus 5.1 (H) 2.5 - 4.6 mg/dL    Comment: Performed at [place] [address] [place], [place] [address]  Glucose, capillary     Status: Abnormal   Collection Time: 09/26/21  8:30 PM  Result Value Ref Range   Glucose-Capillary 124 (H) 70 - 99 mg/dL    Comment: Glucose reference range applies only to samples taken after fasting for at least 8 hours.  Surgical pcr screen     Status: None   Collection Time: 09/26/21 10:44 PM   Specimen: Nasal Mucosa; Nasal Swab  Result Value Ref Range   MRSA, PCR NEGATIVE NEGATIVE   Staphylococcus aureus NEGATIVE NEGATIVE    Comment: (NOTE) The Xpert SA Assay (FDA approved for NASAL specimens in patients 68 years of age and older), is one component of a comprehensive surveillance program. It is not intended to diagnose infection nor to guide or monitor treatment. Performed at [facility], [place] [address] [place], [place] [address]   Glucose, capillary     Status: Abnormal   Collection Time: 09/27/21  7:31 AM  Result Value Ref Range   Glucose-Capillary 107 (H) 70 - 99  mg/dL    Comment: Glucose reference range applies only to samples taken after fasting for at least 8 hours.    DG Chest 1 View  Result Date: 09/26/2021 CLINICAL DATA:  Fall this morning. Right hip fracture. Pre-op clearance exam EXAM: CHEST  1 VIEW COMPARISON:  None. FINDINGS: The heart size and mediastinal contours are within normal limits. Aortic atherosclerotic calcification noted. Both lungs are clear. The visualized skeletal structures are unremarkable. IMPRESSION: No active disease. Electronically Signed   By: Marlaine Hind M.D.   On: 09/26/2021 08:50   DG Lumbar Spine 2-3 Views  Result Date: 09/26/2021 CLINICAL DATA:  Fall this morning. Low back pain. Initial encounter. EXAM: LUMBAR SPINE - 2-3 VIEW COMPARISON:  None. FINDINGS: Posterior elements are not well visualized on the cross-table lateral projection due to artifact from the patient's mattress and bending. There is no evidence of lumbar vertebral body fracture. Alignment is normal. Intervertebral disc spaces are maintained. Lower lumbar facet DJD is noted at L4-5 and L5-S1 bilaterally. Aortic atherosclerotic calcification noted. IMPRESSION: No acute findings. Lower lumbar facet DJD. Electronically Signed   By: Marlaine Hind M.D.   On: 09/26/2021 08:49   DG Knee 1-2 Views Right  Result Date: 09/26/2021 CLINICAL DATA:  Fall this morning. Right knee pain. Initial encounter. EXAM: RIGHT KNEE - 2 VIEW COMPARISON:  None. FINDINGS: No evidence of fracture, dislocation, or joint effusion. Mild osteoarthritis is seen involving the medial compartment. Enthesopathic changes are seen involving the patella. No other osseous abnormality identified. IMPRESSION: No acute findings. Mild medial compartment osteoarthritis. Electronically Signed   By: Marlaine Hind M.D.   On: 09/26/2021 08:48   CT Head Wo Contrast  Result Date: 09/26/2021 CLINICAL DATA:  Fall this morning. Head and neck trauma. Initial encounter. EXAM: CT HEAD WITHOUT CONTRAST CT CERVICAL  SPINE WITHOUT CONTRAST TECHNIQUE: Multidetector CT imaging of the head and cervical spine was performed following the standard protocol without intravenous contrast. Multiplanar CT image reconstructions of the cervical spine  were also generated. COMPARISON:  09/24/2021 FINDINGS: CT HEAD FINDINGS Brain: No evidence of acute infarction, hemorrhage, hydrocephalus, extra-axial collection, or mass lesion/mass effect. Stable moderate diffuse cerebral atrophy and chronic small vessel disease. Old infarcts are again seen involving the left cerebellum. Vascular:  No hyperdense vessel or other acute findings. Skull: No evidence of fracture or other significant bone abnormality. Sinuses/Orbits:  No acute findings. Other: None. CT CERVICAL SPINE FINDINGS Alignment: Normal. Skull base and vertebrae: No acute fracture. No primary bone lesion or focal pathologic process. Soft tissues and spinal canal: No prevertebral fluid or swelling. No visible canal hematoma. Disc levels: Mild-to-moderate degenerative disc disease is seen at all cervical levels. Mild bilateral facet DJD also noted. Upper chest: No acute findings. Other: None. IMPRESSION: No acute intracranial abnormality. 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 No evidence of acute cervical spine fracture or subluxation. Degenerative spondylosis, as described above. Electronically Signed   By: Danae Orleans M.D.   On: 09/26/2021 09:36   CT Cervical Spine Wo Contrast  Result Date: 09/26/2021 CLINICAL DATA:  Fall this morning. Head and neck trauma. Initial encounter. EXAM: CT HEAD WITHOUT CONTRAST CT CERVICAL SPINE WITHOUT CONTRAST TECHNIQUE: Multidetector CT imaging of the head and cervical spine was performed following the standard protocol without intravenous contrast. Multiplanar CT image reconstructions of the cervical spine were also generated. COMPARISON:  09/24/2021 FINDINGS: CT HEAD FINDINGS Brain: No evidence of acute  infarction, hemorrhage, hydrocephalus, extra-axial collection, or mass lesion/mass effect. Stable moderate diffuse cerebral atrophy and chronic small vessel disease. Old infarcts are again seen involving the left cerebellum. Vascular:  No hyperdense vessel or other acute findings. Skull: No evidence of fracture or other significant bone abnormality. Sinuses/Orbits:  No acute findings. Other: None. CT CERVICAL SPINE FINDINGS Alignment: Normal. Skull base and vertebrae: No acute fracture. No primary bone lesion or focal pathologic process. Soft tissues and spinal canal: No prevertebral fluid or swelling. No visible canal hematoma. Disc levels: Mild-to-moderate degenerative disc disease is seen at all cervical levels. Mild bilateral facet DJD also noted. Upper chest: No acute findings. Other: None. IMPRESSION: No acute intracranial abnormality. 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 No evidence of acute cervical spine fracture or subluxation. Degenerative spondylosis, as described above. Electronically Signed   By: Danae Orleans M.D.   On: 09/26/2021 09:36   DG Hip Unilat W or Wo Pelvis 2-3 Views Right  Result Date: 09/26/2021 CLINICAL DATA:  Fall this morning. Right hip pain. Initial encounter. EXAM: DG HIP (WITH OR WITHOUT PELVIS) 2-3V RIGHT COMPARISON:  None. FINDINGS: A comminuted displaced intratrochanteric fracture the right hip is seen with impaction and medial angulation of the distal fracture fragment. No evidence of dislocation. No pelvic fracture or bone lesions identified. IMPRESSION: Comminuted, displaced intratrochanteric right hip fracture. Electronically Signed   By: Danae Orleans M.D.   On: 09/26/2021 08:47    Review of Systems  Unable to perform ROS: Dementia  Blood pressure (!) 122/49, pulse 78, temperature 97.6 F (36.4 C), resp. rate 17, height 5\' 4"  (1.626 m), weight 74 kg, SpO2 96 %. Physical Exam Constitutional:      General: She is not  in acute distress.    Appearance: She is well-developed. She is not diaphoretic.  HENT:     Head: Normocephalic and atraumatic.  Eyes:     General: No scleral icterus.       Right eye: No discharge.  Left eye: No discharge.     Conjunctiva/sclera: Conjunctivae normal.  Cardiovascular:     Rate and Rhythm: Normal rate and regular rhythm.  Pulmonary:     Effort: Pulmonary effort is normal. No respiratory distress.  Musculoskeletal:     Cervical back: Normal range of motion.     Comments: RLE No traumatic wounds, ecchymosis, or rash  Mod TTP hip  No knee or ankle effusion  Knee stable to varus/ valgus and anterior/posterior stress  Sens DPN, SPN, TN intact  Motor EHL, ext, flex, evers 5/5  DP 2+, PT 1+, No significant edema  Skin:    General: Skin is warm and dry.  Neurological:     Mental Status: She is alert.  Psychiatric:        Mood and Affect: Mood normal.        Behavior: Behavior normal.    Assessment/Plan: Right hip fx -- Plan IMN today with Dr. Doreatha Martin. Please keep NPO. Multiple medical problems including CKD, Alzheimer's, HLD, HTN, osteoporosis, recent right thumb distal phalanx fracture -- per primary service. Thumb fx does not require splint.    Lisette Abu, PA-C Orthopedic Surgery [phone] 09/27/2021, 9:44 AM

## 2021-09-27 NOTE — Anesthesia Procedure Notes (Signed)
Procedure Name: Intubation Date/Time: 09/27/2021 1:18 PM Performed by: Nils Pyle, CRNA Pre-anesthesia Checklist: Patient identified, Emergency Drugs available, Suction available and Patient being monitored Patient Re-evaluated:Patient Re-evaluated prior to induction Oxygen Delivery Method: Circle System Utilized Preoxygenation: Pre-oxygenation with 100% oxygen Induction Type: IV induction Ventilation: Mask ventilation without difficulty Laryngoscope Size: Miller and 2 Grade View: Grade II Tube type: Oral Tube size: 7.0 mm Number of attempts: 1 Airway Equipment and Method: Stylet and Oral airway Placement Confirmation: ETT inserted through vocal cords under direct vision, positive ETCO2 and breath sounds checked- equal and bilateral Secured at: 22 cm Tube secured with: Tape Dental Injury: Teeth and Oropharynx as per pre-operative assessment

## 2021-09-27 NOTE — TOC Initial Note (Signed)
Transition of Care [facility]) - Initial/Assessment Note    Patient Details  Name: [name] MRN: [id number] Date of Birth: [birth year]  [facility] CM/SW Contact:    Epifanio Lesches, RN Phone Number: 09/27/2021, 10:03 AM  Clinical Narrative:    Admitted after a fall. Suffered R hip fx. From [facility]. Supportive daughter , [name]. [name] resides in [place]. PTA independent with ADL's. No DME usage.  Plan : surgery, hip repair 11/7   TOC team following and will assist with TOC needs...  Expected Discharge Plan: Assisted Living (vs SNF) Barriers to Discharge: Continued Medical Work up   Patient Goals and CMS Choice        Expected Discharge Plan and Services Expected Discharge Plan: Assisted Living (vs SNF)   Discharge Planning Services: CM Consult   Living arrangements for the past 2 months: Assisted Living Facility                                      Prior Living Arrangements/Services Living arrangements for the past 2 months: Assisted Living Facility Lives with:: Self Patient language and need for interpreter reviewed:: Yes Do you feel safe going back to the place where you live?: Yes      Need for Family Participation in Patient Care: Yes (Comment) Care giver support system in place?: Yes (comment)   Criminal Activity/Legal Involvement Pertinent to Current Situation/Hospitalization: No - Comment as needed  Activities of Daily Living      Permission Sought/Granted   Permission granted to share information with : Yes, Verbal Permission Granted  Share Information with NAME: Christ Kick (Daughter) [phone]           Emotional Assessment Appearance:: Appears stated age Attitude/Demeanor/Rapport: Gracious Affect (typically observed): Accepting Orientation: : Oriented to Self, Oriented to Place, Oriented to  Time, Oriented to Situation Alcohol / Substance Use: Not Applicable Psych Involvement: No (comment)  Admission  diagnosis:  Fall [W19.XXXA] Preop cardiovascular exam [Z01.810] Closed right hip fracture, initial encounter [facility]) [S72.001A] Patient Active Problem List   Diagnosis Date Noted   Closed right hip fracture, initial encounter (HCC) 09/26/2021   Head trauma 09/26/2021   Hypocalcemia 09/26/2021   Hyperglycemia 09/26/2021   Acute metabolic encephalopathy 08/10/2021   COVID-19 08/09/2021   History of syncope 08/13/2019   Syncope and collapse 01/23/2019   Leukocytosis 01/23/2019   Diarrhea 01/23/2019   Prolonged QT interval 01/23/2019   Mild cognitive impairment 07/15/2015   Acute confusional state 06/19/2015   Memory loss 06/19/2015   DM II (diabetes mellitus, type II), controlled (HCC) 05/14/2015   Hyperlipidemia 05/14/2015   Essential hypertension, benign    TIA (transient ischemic attack) 05/13/2015   Anemia, iron deficiency 12/12/2011   Humerus fracture 12/11/2011   Hypoxia 12/11/2011   Wide-complex tachycardia 12/11/2011   Acute hypotension 12/11/2011   PCP:  Lupita Raider, MD Pharmacy:   [id number] - [place], [place] - [address] AT [facility] [address] [place] [address] Phone: [phone] Fax: [phone]  [id number] - [place], [place] - [address] AT [facility] [address] [place] [address] Phone: [phone] Fax: [phone]     Social Determinants of Health (SDOH) Interventions    Readmission Risk Interventions No flowsheet data found.

## 2021-09-27 NOTE — Anesthesia Postprocedure Evaluation (Signed)
Anesthesia Post Note  Patient: [name]  Procedure(s) Performed: INTRAMEDULLARY (IM) NAIL INTERTROCHANTRIC (Right: Hip)     Patient location during evaluation: PACU Anesthesia Type: General Level of consciousness: awake and alert Pain management: pain level controlled Vital Signs Assessment: post-procedure vital signs reviewed and stable Respiratory status: spontaneous breathing, nonlabored ventilation and respiratory function stable Cardiovascular status: blood pressure returned to baseline and stable Postop Assessment: no apparent nausea or vomiting Anesthetic complications: no   No notable events documented.  Last Vitals:  Vitals:   09/27/21 1520 09/27/21 1537  BP: (!) 100/50 (!) 103/53  Pulse: 86 81  Resp: 17   Temp:  (!) 36.4 C  SpO2: 96% 94%    Last Pain:  Vitals:   09/27/21 1505  TempSrc:   PainSc: 0-No pain   Pain Goal: Patients Stated Pain Goal: 2 (09/27/21 0351)      RLE Motor Response: Purposeful movement (09/27/21 1540) RLE Sensation: Full sensation (09/27/21 1540)        Lucretia Kern

## 2021-09-27 NOTE — Op Note (Signed)
Orthopaedic Surgery Operative Note (CSN: [id number] ) Date of Surgery: 09/27/2021  Admit Date: 09/26/2021   Diagnoses: Pre-Op Diagnoses: Right intertrochanteric femur fracture  Post-Op Diagnosis: Same  Procedures: CPT 27245-Cephalomedullary nailing of right intertrochanteric femur fracture   Surgeons : Primary: Shona Needles, MD  Assistant: Patrecia Pace, PA-C  Location: OR 3   Anesthesia:General   Antibiotics: Ancef 2g preop   Tourniquet time:None    Estimated Blood WPVX:48 mL  Complications:None   Specimens:None   Implants: Implant Name Type Inv. Item Serial No. Manufacturer Lot No. LRB No. Used Action  NAIL INTERTAN 10X18 130D 10S - [id number] Nail NAIL INTERTAN 10X18 130D 10S  [employer] [id number] Right 1 Implanted  SCREW LAG COMPR KIT 105/100 - [id number] Screw SCREW LAG COMPR KIT 105/100  [employer] [id number] Right 1 Implanted  SCREW TRIGEN LOW PROF 5.0X37.5 - [id number] Screw SCREW TRIGEN LOW PROF 5.0X37.5  [employer] [id number] Right 1 Implanted     Indications for Surgery: 87 year old female who sustained a right intertrochanteric femur fracture.  Due to the unstable nature of her injury I recommend proceeding with cephalomedullary nailing.  Risks and benefits were discussed with the patient's daughter.  Risks included but not limited to bleeding, infection, malunion, nonunion, hardware failure, hardware irritation, nerve or blood vessel injury, DVT, even possibility anesthetic complications.  She agreed to proceed with surgery and consent was obtained.  Operative Findings: Cephalomedullary nailing of right intertrochanteric femur fracture using [employer] InterTAN 10 x 180 mm nail with a 105 mm lag screw.  Procedure: The patient was identified in the preoperative holding area. Consent was confirmed with the patient and their family and all questions were answered. The operative extremity was marked after  confirmation with the patient. she was then brought back to the operating room by our anesthesia colleagues.  She was placed under general anesthetic and carefully transferred over to the [facility] table.  All bony prominences were well-padded.  Fluoroscopic imaging was obtained to show the unstable nature of her injury.  Traction was applied and adequate reduction was obtained.  The right lower extremity was then prepped and draped in usual sterile fashion.  A timeout was performed to verify the patient, the procedure, and the extremity.  Preoperative antibiotics were dosed.  A small incision was made proximal to the greater trochanter.  A threaded guidewire was placed at the tip of the greater trochanter and advanced into the proximal metaphysis after confirming position on fluoroscopy.  I then used an entry reamer to enter the medullary canal.  I then passed a 10 x 180 mm nail down the center of the canal attached to a targeting arm.  I then made a lateral incision and used the targeting arm to direct a threaded guidewire up into the head/neck segment of the femur.  I confirmed adequate tip apex distance and then measured the length.  I then drilled the path for the compression screw.  I then drilled the path for the lag screw.  I placed the lag screw and then compressed approximately 5 mm.  Excellent reduction was obtained.  I then used the targeting arm to place a distal interlocking screw.  The targeting arm was removed.  Final fluoroscopic imaging was obtained.  The incision was irrigated and closed with 2-0 Vicryl and 3-0 Monocryl with Dermabond.  Sterile dressings were applied.  The patient was then taken to the PACU in stable condition.   Post Op Plan/Instructions: Patient  will be weightbearing as tolerated to the right lower extremity.  She will receive postoperative Ancef.  She will receive Lovenox for DVT prophylaxis.  We will have her mobilize with physical and Occupational Therapy.  I was present  and performed the entire surgery.  Patrecia Pace, PA-C did assist me throughout the case. An assistant was necessary given the difficulty in approach, maintenance of reduction and ability to instrument the fracture.   Katha Hamming, MD [facility]

## 2021-09-27 NOTE — Anesthesia Preprocedure Evaluation (Signed)
Anesthesia Evaluation  Patient identified by MRN, date of birth, ID band Patient awake    Reviewed: Allergy & Precautions, NPO status , Patient's Chart, lab work & pertinent test results, reviewed documented beta blocker date and time   History of Anesthesia Complications Negative for: history of anesthetic complications  Airway Mallampati: II  TM Distance: >3 FB Neck ROM: Full    Dental  (+) Teeth Intact, Dental Advisory Given   Pulmonary neg pulmonary ROS, former smoker,    Pulmonary exam normal        Cardiovascular hypertension, Pt. on medications and Pt. on home beta blockers Normal cardiovascular exam   Echo 08/11/21: EF 60-65%, g1dd, nl RVSF, valves unremarkable   Neuro/Psych Depression Dementia TIA   GI/Hepatic negative GI ROS, Neg liver ROS,   Endo/Other  diabetes, Type 2  Renal/GU CRFRenal disease  negative genitourinary   Musculoskeletal  (+) Arthritis ,   Abdominal   Peds  Hematology negative hematology ROS (+)   Anesthesia Other Findings   Reproductive/Obstetrics                            Anesthesia Physical Anesthesia Plan  ASA: 3  Anesthesia Plan: General   Post-op Pain Management:    Induction: Intravenous  PONV Risk Score and Plan: 3 and Ondansetron, Dexamethasone, Treatment may vary due to age or medical condition and Midazolam  Airway Management Planned: Oral ETT  Additional Equipment: None  Intra-op Plan:   Post-operative Plan: Extubation in OR  Informed Consent: I have reviewed the patients History and Physical, chart, labs and discussed the procedure including the risks, benefits and alternatives for the proposed anesthesia with the patient or authorized representative who has indicated his/her understanding and acceptance.     Dental advisory given and Consent reviewed with POA  Plan Discussed with:   Anesthesia Plan Comments:         Anesthesia Quick Evaluation

## 2021-09-27 NOTE — Progress Notes (Signed)
ANTICOAGULATION CONSULT NOTE  Pharmacy Consult for Resuming Home DOAC on POD 1 if Hgb Stable (Apixaban) Indication: atrial fibrillation  Allergies  Allergen Reactions   Sulfa Antibiotics Hives, Diarrhea, Nausea And Vomiting and Swelling    Facial swelling ALSO   Latex Rash    Patient Measurements: Height: 5\' 4"  (162.6 cm) Weight: 74 kg (163 lb 2.3 oz) IBW/kg (Calculated) : 54.7  Vital Signs: Temp: 98.4 F (36.9 C) (11/07 1144) Temp Source: Oral (11/07 1144) BP: 98/45 (11/07 1144) Pulse Rate: 83 (11/07 1144)  Labs: Recent Labs    09/26/21 0742  HGB 12.6  HCT 40.1  PLT 212  CREATININE 0.96    Estimated Creatinine Clearance: 40.7 mL/min (by C-G formula based on SCr of 0.96 mg/dL).   Medical History: Past Medical History:  Diagnosis Date   Allergies    Alzheimer disease (HCC)    STABLE   Anemia, iron deficiency 12/12/2011   Arthritis    RF   CKD (chronic kidney disease)    STAGE 3   Diabetes mellitus    no mes, diet only    Diarrhea    RESOLVED   Hypercholesterolemia    Hyperlipemia    Hypertension    Major depression    OA (osteoarthritis)    HAND/LEFT KNEE PAIN MILD   Osteoporosis    Syncope     Assessment: 87 yr old woman admitted 09/26/21 with R intertrochanteric femur fracture following a fall at her SNF. Pt is S/P ortho surgery this afternoon. She was on apixaban 2.5 mg PO BID PTA (indication is atrial fibrillation, per annual PE note of 12/31/20). Pharmacy is consulted to resume home DOAC (apixaban) on POD 1 if AM Hgb is at least 8 and pt not requiring blood transfusions.  H/H 12.6/40.1, plt 212, Scr 0.96  Goal of Therapy:  Prevention of stroke secondary to atrial fibrillation Monitor platelets by anticoagulation protocol: Yes   Plan:  Resume home apixaban 2.5 mg PO BID on POD 1 if AM Hgb is at least 8 and pt not requiring blood transfusion Monitor CBC Monitor for bleeding  05-15-1999, PharmD, BCPS, [facility] Clinical  Pharmacist 09/27/2021,2:44 PM

## 2021-09-27 NOTE — Interval H&P Note (Signed)
History and Physical Interval Note:  09/27/2021 12:31 PM  [name]  has presented today for surgery, with the diagnosis of Right Hip Fx.  The various methods of treatment have been discussed with the patient and family. After consideration of risks, benefits and other options for treatment, the patient has consented to  Procedure(s): INTRAMEDULLARY (IM) NAIL INTERTROCHANTRIC (Right) as a surgical intervention.  The patient's history has been reviewed, patient examined, no change in status, stable for surgery.  I have reviewed the patient's chart and labs.  Questions were answered to the patient's satisfaction.     Caryn Bee P Jaivion Kingsley

## 2021-09-27 NOTE — Transfer of Care (Signed)
Immediate Anesthesia Transfer of Care Note  Patient: [name]  Procedure(s) Performed: INTRAMEDULLARY (IM) NAIL INTERTROCHANTRIC (Right: Hip)  Patient Location: PACU  Anesthesia Type:General  Level of Consciousness: awake and alert   Airway & Oxygen Therapy: Patient Spontanous Breathing and Patient connected to nasal cannula oxygen  Post-op Assessment: Report given to RN and Post -op Vital signs reviewed and stable  Post vital signs: Reviewed and stable  Last Vitals:  Vitals Value Taken Time  BP 119/56 09/27/21 1435  Temp    Pulse 96 09/27/21 1437  Resp 11 09/27/21 1437  SpO2 98 % 09/27/21 1437  Vitals shown include unvalidated device data.  Last Pain:  Vitals:   09/27/21 1144  TempSrc: Oral  PainSc:       Patients Stated Pain Goal: 2 (09/27/21 0351)  Complications: No notable events documented.

## 2021-09-27 NOTE — Progress Notes (Signed)
PROGRESS NOTE  [name]  DOB: 1932-04-22  PCP: Lupita Raider, MD SWF:[id number]  DOA: 09/26/2021  LOS: 1 day  Hospital Day: 2  Chief Complaint  Patient presents with   Fall   Hip Pain    Brief narrative: [name] is a 87 y.o. female with PMH significant for DM2, HTN, wide-complex tachycardia, history of syncope, mild cognitive impairment, history of COVID-19  Patient was brought to the ED from an assisted living facility after having an unwitnessed fall hitting her head on a carpeted floor and losing consciousness for about 2 minutes.  She also had pain in the right hip area. Imaging in the ED showed right hip comminuted displaced intertrochanteric fracture. Admitted to hospitalist service. Orthopedic consulted  Subjective: Patient was seen and examined this morning.  Pleasant elderly Caucasian female.  Not in distress.  No new symptoms.  Able to answer some questions but seems to be not aware that she had a hip fracture.  Wonder if he has dementia and does not remember it. Chart reviewed  Assessment/Plan: Closed right hip fracture -Noted a plan for cephalomedullary nailing today. -Pain management and DVT prophylaxis per orthopedics team. -Postop PT eval  Right thumb phalanx fracture -On my exam this morning, noted that patient had bruise on her right thumb.  Patient was not able to give me the details of that.  On chart review I noted that she had an x-ray done on 11/4 that noted a comminuted, nondisplaced and nonangulated fracture of the distal phalanx right thumb.  Static finger splint was placed in the ED. -Patient was also seen by Ortho Dr. Jena Gauss today.  Per his note, thumb fracture does not require splint.   Type 2 diabetes mellitus -A1c 5.5.  Diet controlled -Currently on sliding scale insulin -Blood sugar level controlled Recent Labs  Lab 09/22/21 1344 09/26/21 2030 09/27/21 0731 09/27/21 1128  GLUCAP 116* 124* 107* 115*   Wide-complex  tachycardia -Continue metoprolol 25 mg p.o. twice daily. -Keep electrolytes optimized.  Essential hypertension -Continue metoprolol. -Monitor BP and heart rate.   Mild cognitive impairment -Continue on memantine 10 mg p.o. daily. -Continue sertraline 100 mg p.o. bedtime.   Hypocalcemia -Recheck calcium in AM. -Further work-up depending on result. Recent Labs  Lab 09/22/21 1415 09/24/21 1104 09/26/21 0742  CALCIUM 8.9 8.9 8.8*     Frequent falls -PT eval postprocedure.  Noted history of frequent falls  Living condition: Assisted living facility Goals of care:   Code Status: Full Code  Nutritional status: Body mass index is 28 kg/m. Nutrition Problem: Increased nutrient needs Etiology: hip fracture Signs/Symptoms: estimated needs Diet:  Diet Order             Diet NPO time specified  Diet effective ____                  DVT prophylaxis:  Sequential compression device to OR Start: 09/26/21 1804 SCDs Start: 09/26/21 1156   Antimicrobials: None Fluid: None Consultants: Orthopedics Family Communication: None at bedside  Status is: Inpatient  Remains inpatient appropriate because: Undergoing surgery today  Dispo: The patient is from: Assisted living facility              Anticipated d/c is to: Pending PT eval postprocedure              Patient currently is not medically stable to d/c.   Difficult to place patient No     Infusions:   lactated ringers 10 mL/hr  at 09/27/21 1157    Scheduled Meds:  [START ON 09/28/2021] feeding supplement  237 mL Oral BID BM   [MAR Hold] magnesium oxide  400 mg Oral BID   [MAR Hold] melatonin  3 mg Oral QHS   [MAR Hold] memantine  10 mg Oral BID   [MAR Hold] metoprolol tartrate  25 mg Oral BID   [MAR Hold] potassium chloride  40 mEq Oral Once   [MAR Hold] senna-docusate  1 tablet Oral BID   [MAR Hold] sertraline  100 mg Oral QHS   [MAR Hold] simvastatin  20 mg Oral q1800   [MAR Hold] vitamin B-12  1,000 mcg Oral BID     PRN meds: 0.9 % irrigation (POUR BTL), [MAR Hold] HYDROcodone-acetaminophen, [MAR Hold]  morphine injection   Antimicrobials: Anti-infectives (From admission, onward)    Start     Dose/Rate Route Frequency Ordered Stop   09/27/21 1100  ceFAZolin (ANCEF) IVPB 2g/100 mL premix        2 g 200 mL/hr over 30 Minutes Intravenous To [facility] 09/26/21 1803 09/27/21 1330       Objective: Vitals:   09/27/21 0954 09/27/21 1144  BP: (!) 117/48 (!) 98/45  Pulse:  83  Resp:  18  Temp:  98.4 F (36.9 C)  SpO2:  96%    Intake/Output Summary (Last 24 hours) at 09/27/2021 1408 Last data filed at 09/27/2021 1407 Gross per 24 hour  Intake 900 ml  Output 30 ml  Net 870 ml   Filed Weights   09/26/21 0722 09/27/21 1144  Weight: 74 kg 74 kg   Weight change:  Body mass index is 28 kg/m.   Physical Exam: General exam: Pleasant, elderly Caucasian female.  Not in distress. Skin: No rashes, lesions or ulcers. HEENT: Atraumatic, normocephalic, no obvious bleeding Lungs: Clear to auscultation bilaterally CVS: Regular rate and rhythm, no murmur GI/Abd soft, nontender, nondistended, bowel sound present CNS: Alert, awake, oriented to place and person.  Inconsistent details of history Psychiatry: Mood appropriate Extremities: No pedal edema, no calf tenderness  Data Review: I have personally reviewed the laboratory data and studies available.  F/u labs ordered Unresulted Labs (From admission, onward)     Start     Ordered   09/28/21 0500  CBC with Differential/Platelet  Daily,   R      09/27/21 1408   09/28/21 [id number]  Basic metabolic panel  Daily,   R      09/27/21 1408   09/26/21 0736  Urinalysis, Routine w reflex microscopic  Once,   R        09/26/21 0736            Signed, Terrilee Croak, MD [facility] 09/27/2021

## 2021-09-27 NOTE — Plan of Care (Signed)
  Problem: Education: Goal: Verbalization of understanding the information provided (i.e., activity precautions, restrictions, etc) will improve Outcome: Progressing   Problem: Activity: Goal: Ability to ambulate and perform ADLs will improve Outcome: Progressing   Problem: Pain Management: Goal: Pain level will decrease Outcome: Progressing   Problem: Education: Goal: Knowledge of General Education information will improve Description: Including pain rating scale, medication(s)/side effects and non-pharmacologic comfort measures Outcome: Progressing   Problem: Activity: Goal: Risk for activity intolerance will decrease Outcome: Progressing   Problem: Pain Managment: Goal: General experience of comfort will improve Outcome: Progressing   Problem: Safety: Goal: Ability to remain free from injury will improve Outcome: Progressing   Problem: Skin Integrity: Goal: Risk for impaired skin integrity will decrease Outcome: Progressing   

## 2021-09-27 NOTE — TOC CAGE-AID Note (Signed)
Transition of Care [facility]) - CAGE-AID Screening   Patient Details  Name: [name] MRN: [id number] Date of Birth: 15-Feb-1932  [facility] of [facility]) CM/SW Contact:    Tiya Schrupp C Tarpley-Carter, LCSWA Phone Number: 09/27/2021, 11:01 AM   Clinical Narrative: Pt is unable to participate in Cage Aid. Pt is not appropriate for assessment.  Joselyne Spake Tarpley-Carter, MSW, LCSW-A Pronouns:  She/Her/Hers [facility] Clinical Social Worker Direct Number:  [phone] [email or web address]  CAGE-AID Screening: Substance Abuse Screening unable to be completed due to: : Patient unable to participate (Pt is not appropriate for assessment.)             Substance Abuse Education Offered: No

## 2021-09-28 ENCOUNTER — Encounter (HOSPITAL_COMMUNITY): Payer: Self-pay | Admitting: Student

## 2021-09-28 LAB — CBC WITH DIFFERENTIAL/PLATELET
Abs Immature Granulocytes: 0.07 10*3/uL (ref 0.00–0.07)
Basophils Absolute: 0 10*3/uL (ref 0.0–0.1)
Basophils Relative: 0 %
Eosinophils Absolute: 0 10*3/uL (ref 0.0–0.5)
Eosinophils Relative: 0 %
HCT: 29.8 % — ABNORMAL LOW (ref 36.0–46.0)
Hemoglobin: 9.5 g/dL — ABNORMAL LOW (ref 12.0–15.0)
Immature Granulocytes: 1 %
Lymphocytes Relative: 8 %
Lymphs Abs: 0.8 10*3/uL (ref 0.7–4.0)
MCH: 29.1 pg (ref 26.0–34.0)
MCHC: 31.9 g/dL (ref 30.0–36.0)
MCV: 91.4 fL (ref 80.0–100.0)
Monocytes Absolute: 0.6 10*3/uL (ref 0.1–1.0)
Monocytes Relative: 6 %
Neutro Abs: 8.1 10*3/uL — ABNORMAL HIGH (ref 1.7–7.7)
Neutrophils Relative %: 85 %
Platelets: 160 10*3/uL (ref 150–400)
RBC: 3.26 MIL/uL — ABNORMAL LOW (ref 3.87–5.11)
RDW: 13.2 % (ref 11.5–15.5)
WBC: 9.5 10*3/uL (ref 4.0–10.5)
nRBC: 0 % (ref 0.0–0.2)

## 2021-09-28 LAB — GLUCOSE, CAPILLARY
Glucose-Capillary: 126 mg/dL — ABNORMAL HIGH (ref 70–99)
Glucose-Capillary: 110 mg/dL — ABNORMAL HIGH (ref 70–99)
Glucose-Capillary: 100 mg/dL — ABNORMAL HIGH (ref 70–99)
Glucose-Capillary: 98 mg/dL (ref 70–99)

## 2021-09-28 LAB — BASIC METABOLIC PANEL
Anion gap: 9 (ref 5–15)
BUN: 21 mg/dL (ref 8–23)
CO2: 23 mmol/L (ref 22–32)
Calcium: 8.3 mg/dL — ABNORMAL LOW (ref 8.9–10.3)
Chloride: 102 mmol/L (ref 98–111)
Creatinine, Ser: 1.28 mg/dL — ABNORMAL HIGH (ref 0.44–1.00)
GFR, Estimated: 41 mL/min — ABNORMAL LOW (ref >60)
Glucose, Bld: 144 mg/dL — ABNORMAL HIGH (ref 70–99)
Potassium: 4.3 mmol/L (ref 3.5–5.1)
Sodium: 134 mmol/L — ABNORMAL LOW (ref 135–145)

## 2021-09-28 MED ORDER — VITAMIN D 25 MCG (1000 UNIT) PO TABS
1000.0000 [IU] | ORAL_TABLET | Freq: Every day | ORAL | Status: DC
  Administered 2021-09-28 – 2021-09-30 (×3): 1000 [IU] via ORAL
  Filled 2021-09-28 (×3): qty 1

## 2021-09-28 MED ORDER — APIXABAN 2.5 MG PO TABS
2.5000 mg | ORAL_TABLET | Freq: Two times a day (BID) | ORAL | Status: DC
  Administered 2021-09-28 – 2021-09-30 (×5): 2.5 mg via ORAL
  Filled 2021-09-28 (×5): qty 1

## 2021-09-28 NOTE — Evaluation (Addendum)
Occupational Therapy Evaluation Patient Details Name: [name] MRN: [id number] DOB: 1932/05/29 Today's Date: 09/28/2021   History of Present Illness Pt is 87 y/o F admitted s/p fall at SNF and found to have R hip fx and R thoumb fx. S/p R femur IM nailing 09/27/21. PMH includes CKD, Alzheimer's, HLD, HTN, osteoporosis, DM, OA.   Clinical Impression   Pt presents with decreased balance, activity tolerance, and cognition, and pain. Pt currently requiring Max A with LB ADLs, Total A with toileting, Mod A for squat pivot transfers and Mod A +2 for bed mobility. Pt is poor historian and reports independent at baseline at ALF, however unable to verify. Pt would likely benefit from SNF placement to improve safety/independence with ADLs and functional transfers/mobility prior to returning to ALF. Will follow acutely.      Recommendations for follow up therapy are one component of a multi-disciplinary discharge planning process, led by the attending physician.  Recommendations may be updated based on patient status, additional functional criteria and insurance authorization.   Follow Up Recommendations  Skilled nursing-short term rehab (<3 hours/day)    Assistance Recommended at Discharge Frequent or constant Supervision/Assistance  Functional Status Assessment  Patient has had a recent decline in their functional status and demonstrates the ability to make significant improvements in function in a reasonable and predictable amount of time.  Equipment Recommendations  Other (comment) (Defer)    Recommendations for Other Services PT consult     Precautions / Restrictions Precautions Precautions: Fall;Other (comment) Precaution Comments: monitor O2 Restrictions Weight Bearing Restrictions: Yes RLE Weight Bearing: Weight bearing as tolerated      Mobility Bed Mobility Overal bed mobility: Needs Assistance Bed Mobility: Supine to Sit     Supine to sit: Mod assist;+2 for physical  assistance     General bed mobility comments: Assist to manage RLE and elevate trunk    Transfers Overall transfer level: Needs assistance   Transfers: Bed to chair/wheelchair/BSC     Squat pivot transfers: Mod assist       General transfer comment: Pt fearful of standing to RW due to pain, however agreeable to squat pivot transfer to recliner. Requiring cues for hand placement and technique      Balance Overall balance assessment: Needs assistance Sitting-balance support: Bilateral upper extremity supported;Feet supported Sitting balance-Leahy Scale: Good                                     ADL either performed or assessed with clinical judgement   ADL Overall ADL's : Needs assistance/impaired Eating/Feeding: Independent   Grooming: Set up;Sitting   Upper Body Bathing: Minimal assistance;Sitting   Lower Body Bathing: Maximal assistance;Sitting/lateral leans   Upper Body Dressing : Min guard;Sitting   Lower Body Dressing: Maximal assistance;Sitting/lateral leans   Toilet Transfer: Moderate assistance;Squat-pivot;BSC/3in1 Toilet Transfer Details (indicate cue type and reason): Simulated to recliner. Toileting- Clothing Manipulation and Hygiene: Total assistance         General ADL Comments: Limited by pain, balance, activity tolerance, and cognition.     Vision         Perception     Praxis      Pertinent Vitals/Pain Pain Assessment: 0-10 Pain Score: 9  Pain Location: R hip, R thumb Pain Descriptors / Indicators: Aching;Grimacing;Guarding;Sore Pain Intervention(s): Limited activity within patient's tolerance;Monitored during session;Repositioned     Hand Dominance Left   Extremity/Trunk Assessment Upper Extremity  Assessment Upper Extremity Assessment: Generalized weakness;RUE deficits/detail RUE Deficits / Details: Pain with movement at thumb   Lower Extremity Assessment Lower Extremity Assessment: Defer to PT evaluation        Communication Communication Communication: Other (comment) (Some word-finding diffculties noted)   Cognition Arousal/Alertness: Awake/alert Behavior During Therapy: WFL for tasks assessed/performed Overall Cognitive Status: History of cognitive impairments - at baseline                                 General Comments: Hx of Alzhiemer's. Some word-finding difficulty noted. Increased cueing to maintain attention to task. Noted focusing on pain.     General Comments  O2 93% on RA at rest. Desat to 85% coming to EOB. Nsg present and applied O2 2.5 L. O2 recovered to 95%. Desat to 88% after squat pivot transfer. Recovered to 99% in sitting position in recliner.    Exercises     Shoulder Instructions      Home Living Family/patient expects to be discharged to:: Assisted living                             Home Equipment: None   Additional Comments: pt is a poor historian.      Prior Functioning/Environment Prior Level of Function : Patient poor historian/Family not available             Mobility Comments: States she was independent with mobility at SNF, unable to verify. ADLs Comments: States she was independent with ADLs at SNF, unable to verify.        OT Problem List: Decreased strength;Decreased activity tolerance;Impaired balance (sitting and/or standing);Decreased cognition;Decreased safety awareness;Decreased knowledge of use of DME or AE;Decreased knowledge of precautions;Cardiopulmonary status limiting activity;Pain      OT Treatment/Interventions: Self-care/ADL training;Therapeutic exercise;Neuromuscular education;Energy conservation;DME and/or AE instruction;Therapeutic activities;Patient/family education;Balance training;Cognitive remediation/compensation    OT Goals(Current goals can be found in the care plan section) Acute Rehab OT Goals Patient Stated Goal: reduce pain OT Goal Formulation: With patient Time For Goal Achievement:  10/12/21 Potential to Achieve Goals: Fair  OT Frequency: Min 2X/week   Barriers to D/C:            Co-evaluation              AM-PAC OT "6 Clicks" Daily Activity     Outcome Measure Help from another person eating meals?: None Help from another person taking care of personal grooming?: A Little Help from another person toileting, which includes using toliet, bedpan, or urinal?: Total Help from another person bathing (including washing, rinsing, drying)?: A Lot Help from another person to put on and taking off regular upper body clothing?: A Little Help from another person to put on and taking off regular lower body clothing?: A Lot 6 Click Score: 15   End of Session Equipment Utilized During Treatment: Gait belt;Oxygen Nurse Communication: Mobility status  Activity Tolerance: Patient limited by pain Patient left: in chair;with call bell/phone within reach;with chair alarm set;with nursing/sitter in room  OT Visit Diagnosis: Unsteadiness on feet (R26.81);History of falling (Z91.81);Muscle weakness (generalized) (M62.81);Other symptoms and signs involving cognitive function;Pain                Time: 0932-6712 OT Time Calculation (min): 30 min Charges:  OT General Charges $OT Visit: 1 Visit OT Evaluation $OT Eval Moderate Complexity: 1 Mod OT Treatments $Therapeutic Activity:  8-22 mins  Danney Bungert C, OT/L  Acute Rehab [place] 09/28/2021, 10:48 AM

## 2021-09-28 NOTE — Progress Notes (Signed)
PT Cancellation Note  Patient Details Name: [name] MRN: [id number] DOB: 1932/07/22   Cancelled Treatment:    Reason Eval/Treat Not Completed: Fatigue/lethargy limiting ability to participate.  Just got up from bed and will need to wait to rest.  Retry at another time.   Ivar Drape 09/28/2021, 12:01 PM  Samul Dada, PT PhD Acute Rehab Dept. Number: [facility] [id number] and [facility] [phone]

## 2021-09-28 NOTE — Plan of Care (Addendum)
@  1700 Notified Dr. Pola Corn about soft DBP, no new orders, will continue to monitor and continue fluids.   Problem: Education: Goal: Verbalization of understanding the information provided (i.e., activity precautions, restrictions, etc) will improve Outcome: Progressing   Problem: Activity: Goal: Ability to ambulate and perform ADLs will improve Outcome: Progressing   Problem: Pain Management: Goal: Pain level will decrease Outcome: Progressing   Problem: Education: Goal: Knowledge of General Education information will improve Description: Including pain rating scale, medication(s)/side effects and non-pharmacologic comfort measures Outcome: Progressing   Problem: Activity: Goal: Risk for activity intolerance will decrease Outcome: Progressing   Problem: Pain Managment: Goal: General experience of comfort will improve Outcome: Progressing   Problem: Safety: Goal: Ability to remain free from injury will improve Outcome: Progressing   Problem: Skin Integrity: Goal: Risk for impaired skin integrity will decrease Outcome: Progressing

## 2021-09-28 NOTE — Progress Notes (Signed)
PROGRESS NOTE  [name]  DOB: 08-23-1932  PCP: Mayra Neer, MD [id number]  DOA: 09/26/2021  LOS: 2 days  Hospital Day: 3  Chief Complaint  Patient presents with   Fall   Hip Pain    Brief narrative: [name] is a 87 y.o. female with PMH significant for DM2, HTN, wide-complex tachycardia, history of syncope, mild cognitive impairment, history of COVID-19  Patient was brought to the ED from an assisted living facility after having an unwitnessed fall hitting her head on a carpeted floor and losing consciousness for about 2 minutes.  She also had pain in the right hip area. Imaging in the ED showed right hip comminuted displaced intertrochanteric fracture. Admitted to hospitalist service. Orthopedic consulted  Subjective: Patient was seen and examined this morning.   Lying on bed.  Not in distress.  No new symptoms.  Not in pain.  Does not seem to remember that she had a fracture fixed yesterday.  Has cognitive impairment at baseline.  Assessment/Plan: Closed right hip fracture -Noted a plan for cephalomedullary nailing today. -Pain management and DVT prophylaxis per orthopedics team. -Pending postop PT eval  Right thumb phalanx fracture -On my exam this morning, noted that patient had bruise on her right thumb.  Patient was not able to give me the details of that.  On chart review I noted that she had an x-ray done on 11/4 that noted a comminuted, nondisplaced and nonangulated fracture of the distal phalanx right thumb.  Static finger splint was placed in the ED. -Per orthopedics note, thumb fracture does not require splint.  Vitamin D deficiency -Vitamin D level low at 15.  Oral supplement ordered.  AKI -Creatinine up by more than 0.3 points in last 24 hours. -Continue normal saline at 75 mill per hour.  Continue to monitor creatinine. Recent Labs    08/09/21 1053 08/10/21 0300 08/11/21 0017 08/12/21 0404 08/12/21 2307 08/13/21 0706 08/16/21 0339  09/22/21 1415 09/24/21 1104 09/26/21 0742 09/28/21 0518  BUN 15 22 30* 30* 33* 32*  --  12 9 12 21   CREATININE 1.17* 1.02* 1.24* 1.11* 1.05* 1.05* 1.26* 1.33* 1.01* 0.96 1.28*    Type 2 diabetes mellitus -A1c 5.5.  Diet controlled -Currently on sliding scale insulin -Continue to monitor blood sugar level. Recent Labs  Lab 09/27/21 1128 09/27/21 1517 09/27/21 1536 09/27/21 1950 09/28/21 0626  GLUCAP 115* 133* 128* 243* 126*    Wide-complex tachycardia -Continue metoprolol 25 mg p.o. twice daily. -Keep electrolytes optimized.  Essential hypertension -Continue metoprolol. -Monitor BP and heart rate.   Mild cognitive impairment -Continue on memantine 10 mg p.o. daily. -Continue sertraline 100 mg p.o. bedtime.   Hypocalcemia -Recheck calcium in AM. -Further work-up depending on result. Recent Labs  Lab 09/22/21 1415 09/24/21 1104 09/26/21 0742 09/28/21 0518  CALCIUM 8.9 8.9 8.8* 8.3*      Frequent falls -PT eval postprocedure.  Noted history of frequent falls  Living condition: Assisted living facility Goals of care:   Code Status: Full Code  Nutritional status: Body mass index is 28 kg/m. Nutrition Problem: Increased nutrient needs Etiology: hip fracture Signs/Symptoms: estimated needs Diet:  Diet Order             Diet Carb Modified Fluid consistency: Thin; Room service appropriate? Yes  Diet effective now                  DVT prophylaxis:  apixaban (ELIQUIS) tablet 2.5 mg Start: 09/28/21 1000 SCDs Start: 09/27/21  1539 SCDs Start: 09/26/21 1156 apixaban (ELIQUIS) tablet 2.5 mg   Antimicrobials: None Fluid: None Consultants: Orthopedics Family Communication: None at bedside  Status is: Inpatient  Remains inpatient appropriate because: POD1, pending PT eval  Dispo: The patient is from: Assisted living facility              Anticipated d/c is to: Pending PT eval postprocedure              Patient currently is not medically stable to  d/c.   Difficult to place patient No     Infusions:   sodium chloride 75 mL/hr at 09/28/21 0311    ceFAZolin (ANCEF) IV 2 g (09/28/21 0622)    Scheduled Meds:  apixaban  2.5 mg Oral BID   cholecalciferol  1,000 Units Oral Daily   docusate sodium  100 mg Oral BID   feeding supplement  237 mL Oral BID BM   magnesium oxide  400 mg Oral BID   melatonin  3 mg Oral QHS   memantine  10 mg Oral BID   metoprolol tartrate  25 mg Oral BID   potassium chloride  40 mEq Oral Once   senna-docusate  1 tablet Oral BID   sertraline  100 mg Oral QHS   simvastatin  20 mg Oral q1800   vitamin B-12  1,000 mcg Oral BID    PRN meds: acetaminophen, diphenhydrAMINE, HYDROcodone-acetaminophen, metoCLOPramide **OR** metoCLOPramide (REGLAN) injection, morphine injection, ondansetron **OR** ondansetron (ZOFRAN) IV, polyethylene glycol   Antimicrobials: Anti-infectives (From admission, onward)    Start     Dose/Rate Route Frequency Ordered Stop   09/27/21 2200  ceFAZolin (ANCEF) IVPB 2g/100 mL premix        2 g 200 mL/hr over 30 Minutes Intravenous Every 8 hours 09/27/21 1538 09/28/21 2159   09/27/21 1100  ceFAZolin (ANCEF) IVPB 2g/100 mL premix        2 g 200 mL/hr over 30 Minutes Intravenous To [facility] 09/26/21 1803 09/27/21 1330       Objective: Vitals:   09/28/21 0420 09/28/21 0700  BP: 126/60 (!) 111/49  Pulse: 77 85  Resp: 16 17  Temp: (!) 97.4 F (36.3 C) 97.7 F (36.5 C)  SpO2: 97% 96%    Intake/Output Summary (Last 24 hours) at 09/28/2021 1019 Last data filed at 09/28/2021 0900 Gross per 24 hour  Intake 2114.62 ml  Output 230 ml  Net 1884.62 ml    Filed Weights   09/26/21 0722 09/27/21 1144  Weight: 74 kg 74 kg   Weight change: 0 kg Body mass index is 28 kg/m.   Physical Exam: General exam: Pleasant, elderly Caucasian female.  Not in distress Skin: No rashes, lesions or ulcers. HEENT: Atraumatic, normocephalic, no obvious bleeding Lungs: Clear to  auscultation bilaterally CVS: Regular rate and rhythm, no murmur GI/Abd soft, nontender, nondistended, bowel sound present CNS: Alert, awake, oriented to place and person.  Inconsistent details of history Psychiatry: Mood appropriate Extremities: No pedal edema, no calf tenderness  Data Review: I have personally reviewed the laboratory data and studies available.  F/u labs ordered Unresulted Labs (From admission, onward)     Start     Ordered   09/28/21 0500  CBC with Differential/Platelet  Daily,   R      09/27/21 1408   09/28/21 0500  Basic metabolic panel  Daily,   R      09/27/21 1408   09/26/21 0736  Urinalysis, Routine w reflex microscopic  Once,  R        09/26/21 0736            Signed, Terrilee Croak, MD [facility] 09/28/2021

## 2021-09-28 NOTE — Evaluation (Signed)
Physical Therapy Evaluation Patient Details Name: [name] MRN: [id number] DOB: Nov 12, 1932 Today's Date: 09/28/2021  History of Present Illness  Pt is 87 y/o F admitted 11/6 s/p fall at SNF and found to have R hip fx and right thumb distal phalanx fracture. S/p R femur IM nailing 09/27/21. PMH includes CKD, Alzheimer's, HLD, HTN, osteoporosis, DM, OA. falls  Clinical Impression  Pt was seen for attempt to stand but did not agree to flex R knee to allow to get into standing posture from sitting.  Instead practiced AP transfer to go to bed with nursing staff in attendance to instruct her safe technique. Used chair linen to slide and had CNA take control of RLE to slide into bed and then pivoted on linen.  Pt is agitated by any pain including soreness on LLE from her fall.  History is inconsistent and will continue to work on her mobility and try to get to her true PLOF from any available family.  Follow acutely for progressing her to standing and to try to walk before return to her SNF.  PREMEDICATE and encourage, monitor O2 sats as needed.       Recommendations for follow up therapy are one component of a multi-disciplinary discharge planning process, led by the attending physician.  Recommendations may be updated based on patient status, additional functional criteria and insurance authorization.  Follow Up Recommendations Skilled nursing-short term rehab (<3 hours/day)    Assistance Recommended at Discharge Frequent or constant Supervision/Assistance  Functional Status Assessment Patient has had a recent decline in their functional status and/or demonstrates limited ability to make significant improvements in function in a reasonable and predictable amount of time  Equipment Recommendations  None recommended by PT    Recommendations for Other Services       Precautions / Restrictions Precautions Precautions: Fall;Other (comment) Precaution Comments: monitor O2 Required Braces or  Orthoses:  (MD declines to splint L thumb) Restrictions Weight Bearing Restrictions: Yes RLE Weight Bearing: Weight bearing as tolerated Other Position/Activity Restrictions: R thumb is not commented on but is recent, assumed WBAT on walker      Mobility  Bed Mobility Overal bed mobility: Needs Assistance Bed Mobility: Sit to Supine       Sit to supine: Max assist;+2 for physical assistance;+2 for safety/equipment   General bed mobility comments: assist to move legs into bed and pivot to lie supine    Transfers Overall transfer level: Needs assistance Equipment used: 2 person hand held assist;None Transfers: Bed to chair/wheelchair/BSC         Anterior-Posterior transfers: Max assist;+2 physical assistance;+2 safety/equipment;From elevated surface (with third person to assist LE's)   General transfer comment: pt was completely unable to tolerate standing or bending R knee so did AP transfer chair to bed    Ambulation/Gait               General Gait Details: unable to tolerate  Stairs            Wheelchair Mobility    Modified Rankin (Stroke Patients Only)       Balance Overall balance assessment: Needs assistance Sitting-balance support: Bilateral upper extremity supported Sitting balance-Leahy Scale: Good Sitting balance - Comments: can hold good balance but is resistant to sitting up   Standing balance support:  (unable to tolerate)  Pertinent Vitals/Pain Pain Assessment: Faces Faces Pain Scale: Hurts whole lot Breathing: occasional labored breathing, short period of hyperventilation Negative Vocalization: repeated troubled calling out, loud moaning/groaning, crying Facial Expression: facial grimacing Body Language: rigid, fists clenched, knees up, pushing/pulling away, strikes out Consolability: distracted or reassured by voice/touch PAINAD Score: 8 Pain Descriptors / Indicators:  Grimacing;Guarding;Operative site guarding Pain Intervention(s): Limited activity within patient's tolerance;Monitored during session;Premedicated before session;Repositioned    Home Living Family/patient expects to be discharged to:: Assisted living                 Home Equipment: None Additional Comments: Pt reports she fell visiting a friend at LTC and was sent to another    Prior Function Prior Level of Function : Patient poor historian/Family not available             Mobility Comments: no accurate history was verified       Hand Dominance   Dominant Hand: Left    Extremity/Trunk Assessment   Upper Extremity Assessment Upper Extremity Assessment: Defer to OT evaluation    Lower Extremity Assessment Lower Extremity Assessment: RLE deficits/detail RLE Deficits / Details: pain and edema over surgery site RLE: Unable to fully assess due to pain RLE Coordination: decreased gross motor    Cervical / Trunk Assessment Cervical / Trunk Assessment: Kyphotic  Communication   Communication: Expressive difficulties;Other (comment) (stu-)  Cognition Arousal/Alertness: Awake/alert Behavior During Therapy: Agitated;Restless Overall Cognitive Status: History of cognitive impairments - at baseline                                 General Comments: Hx of Alzhiemer's. Some word-finding difficulty noted. Increased cueing to maintain attention to task. Noted focusing on pain.        General Comments General comments (skin integrity, edema, etc.): Pt is demonstrating moderate edema and pain with R thigh post surgery and is reluctant to move and flex the knee.  Would not allow PT to place the foot on the floor, had to  keep fully extended so did AP transfer to bed    Exercises General Exercises - Lower Extremity Heel Slides: AAROM;5 reps   Assessment/Plan    PT Assessment Patient needs continued PT services  PT Problem List Decreased strength;Decreased  range of motion;Decreased activity tolerance;Decreased balance;Decreased mobility;Decreased coordination;Decreased cognition;Decreased knowledge of use of DME;Decreased safety awareness;Cardiopulmonary status limiting activity;Pain;Decreased skin integrity       PT Treatment Interventions DME instruction;Gait training;Functional mobility training;Therapeutic activities;Therapeutic exercise;Balance training;Neuromuscular re-education;Patient/family education    PT Goals (Current goals can be found in the Care Plan section)  Acute Rehab PT Goals Patient Stated Goal: none stated PT Goal Formulation: Patient unable to participate in goal setting Time For Goal Achievement: 10/12/21 Potential to Achieve Goals: Fair    Frequency Min 5X/week   Barriers to discharge   will return to SNF    Co-evaluation               AM-PAC PT "6 Clicks" Mobility  Outcome Measure Help needed turning from your back to your side while in a flat bed without using bedrails?: A Lot Help needed moving from lying on your back to sitting on the side of a flat bed without using bedrails?: A Lot Help needed moving to and from a bed to a chair (including a wheelchair)?: Total Help needed standing up from a chair using your arms (e.g., wheelchair or bedside chair)?:  Total Help needed to walk in hospital room?: Total Help needed climbing 3-5 steps with a railing? : Total 6 Click Score: 8    End of Session Equipment Utilized During Treatment: Gait belt Activity Tolerance: Patient limited by pain Patient left: in bed;with call bell/phone within reach;with bed alarm set;with nursing/sitter in room Nurse Communication: Mobility status PT Visit Diagnosis: Muscle weakness (generalized) (M62.81);Pain;Difficulty in walking, not elsewhere classified (R26.2) Pain - Right/Left: Right Pain - part of body: Hip;Knee;Leg    Time: [id number] PT Time Calculation (min) (ACUTE ONLY): 42 min   Charges:   PT Evaluation $PT  Eval Moderate Complexity: 1 Mod PT Treatments $Therapeutic Activity: 23-37 mins       Ramond Dial 09/28/2021, 3:50 PM  Mee Hives, PT PhD Acute Rehab Dept. Number: [place] [facility] [place]

## 2021-09-28 NOTE — Progress Notes (Signed)
Orthopaedic Trauma Progress Note  SUBJECTIVE:Pain in right hip this morning.  Did not remember that she had surgery yesterday afternoon.  No SOB. No nausea/vomiting. No other complaints.   OBJECTIVE:  Vitals:   09/28/21 0420 09/28/21 0700  BP: 126/60 (!) 111/49  Pulse: 77 85  Resp: 16 17  Temp: (!) 97.4 F (36.3 C) 97.7 F (36.5 C)  SpO2: 97% 96%    General: Sitting up in bed eating breakfast, no acute distress Respiratory: No increased work of breathing.  Right lower extremity: Dressing clean, dry, intact.  Tender over hip as expected.  Ankle dorsiflexion/plantarflexion intact.  Endorses sensation throughout extremity.  Foot warm and well-perfused.  Neurovascularly intact  IMAGING: Stable post op imaging.   LABS:  Results for orders placed or performed during the hospital encounter of 09/26/21 (from the past 24 hour(s))  Glucose, capillary     Status: Abnormal   Collection Time: 09/27/21 11:28 AM  Result Value Ref Range   Glucose-Capillary 115 (H) 70 - 99 mg/dL  Glucose, capillary     Status: Abnormal   Collection Time: 09/27/21  3:17 PM  Result Value Ref Range   Glucose-Capillary 133 (H) 70 - 99 mg/dL  Glucose, capillary     Status: Abnormal   Collection Time: 09/27/21  3:36 PM  Result Value Ref Range   Glucose-Capillary 128 (H) 70 - 99 mg/dL  VITAMIN D 25 Hydroxy (Vit-D Deficiency, Fractures)     Status: Abnormal   Collection Time: 09/27/21  4:45 PM  Result Value Ref Range   Vit D, 25-Hydroxy 15.74 (L) 30 - 100 ng/mL  Glucose, capillary     Status: Abnormal   Collection Time: 09/27/21  7:50 PM  Result Value Ref Range   Glucose-Capillary 243 (H) 70 - 99 mg/dL  CBC with Differential/Platelet     Status: Abnormal   Collection Time: 09/28/21  5:18 AM  Result Value Ref Range   WBC 9.5 4.0 - 10.5 K/uL   RBC 3.26 (L) 3.87 - 5.11 MIL/uL   Hemoglobin 9.5 (L) 12.0 - 15.0 g/dL   HCT 27.7 (L) 82.4 - 23.5 %   MCV 91.4 80.0 - 100.0 fL   MCH 29.1 26.0 - 34.0 pg   MCHC 31.9  30.0 - 36.0 g/dL   RDW 36.1 44.3 - 15.4 %   Platelets 160 150 - 400 K/uL   nRBC 0.0 0.0 - 0.2 %   Neutrophils Relative % 85 %   Neutro Abs 8.1 (H) 1.7 - 7.7 K/uL   Lymphocytes Relative 8 %   Lymphs Abs 0.8 0.7 - 4.0 K/uL   Monocytes Relative 6 %   Monocytes Absolute 0.6 0.1 - 1.0 K/uL   Eosinophils Relative 0 %   Eosinophils Absolute 0.0 0.0 - 0.5 K/uL   Basophils Relative 0 %   Basophils Absolute 0.0 0.0 - 0.1 K/uL   Immature Granulocytes 1 %   Abs Immature Granulocytes 0.07 0.00 - 0.07 K/uL  Basic metabolic panel     Status: Abnormal   Collection Time: 09/28/21  5:18 AM  Result Value Ref Range   Sodium 134 (L) 135 - 145 mmol/L   Potassium 4.3 3.5 - 5.1 mmol/L   Chloride 102 98 - 111 mmol/L   CO2 23 22 - 32 mmol/L   Glucose, Bld 144 (H) 70 - 99 mg/dL   BUN 21 8 - 23 mg/dL   Creatinine, Ser 0.08 (H) 0.44 - 1.00 mg/dL   Calcium 8.3 (L) 8.9 - 10.3 mg/dL  GFR, Estimated 41 (L) >60 mL/min   Anion gap 9 5 - 15  Glucose, capillary     Status: Abnormal   Collection Time: 09/28/21  6:26 AM  Result Value Ref Range   Glucose-Capillary 126 (H) 70 - 99 mg/dL    ASSESSMENT: [name] is a 87 y.o. female, 1 Day Post-Op s/p INTRAMEDULLARY (IM) NAIL RIGHT INTERTROCHANTRIC FEMUR FRACTURE  CV/Blood loss: Acute blood loss anemia, Hgb 9.5 this morning. Hemodynamically stable  PLAN: Weightbearing: WBAT RLE ROM: Okay for unrestricted range of motion of the hip and knee as tolerated Incisional and dressing care: Plan to remove dressings tomorrow and leave incisions open to air Showering: Okay to begin showering with assistance 09/30/2021.  Incisions may get wet at this point Orthopedic device(s): None  Pain management:  1. Tylenol 325-650 mg q 6 hours PRN 2. Norco 5-325 mg q 6 hours PRN 3. Morphine 0.5 mg q 2 hours PRN VTE prophylaxis:  Eliquis , SCDs ID:  Ancef 2gm post op Foley/Lines:  No foley, KVO IVFs Impediments to Fracture Healing: Vitamin D level 15, started on  supplementation.  Recommend continue this at discharge Dispo: PT/OT evaluation today, dispo pending.  Plan to remove dressings tomorrow from left hip.  Okay for discharge from ortho standpoint once cleared by medicine team and therapies Follow - up plan: 2 weeks after discharge for repeat x-rays and wound check.  Contact information:  Katha Hamming MD, Patrecia Pace PA-C. After hours and holidays please check [email or web address] for group call information for [facility]   Shimshon Narula A. Ricci Barker, PA-C [phone] (office) [email or web address]

## 2021-09-29 ENCOUNTER — Encounter (HOSPITAL_COMMUNITY): Payer: Self-pay | Admitting: Student

## 2021-09-29 LAB — CBC WITH DIFFERENTIAL/PLATELET
Abs Immature Granulocytes: 0.05 10*3/uL (ref 0.00–0.07)
Basophils Absolute: 0 10*3/uL (ref 0.0–0.1)
Basophils Relative: 0 %
Eosinophils Absolute: 0.2 10*3/uL (ref 0.0–0.5)
Eosinophils Relative: 2 %
HCT: 28.5 % — ABNORMAL LOW (ref 36.0–46.0)
Hemoglobin: 9.2 g/dL — ABNORMAL LOW (ref 12.0–15.0)
Immature Granulocytes: 1 %
Lymphocytes Relative: 19 %
Lymphs Abs: 1.7 10*3/uL (ref 0.7–4.0)
MCH: 29.1 pg (ref 26.0–34.0)
MCHC: 32.3 g/dL (ref 30.0–36.0)
MCV: 90.2 fL (ref 80.0–100.0)
Monocytes Absolute: 0.8 10*3/uL (ref 0.1–1.0)
Monocytes Relative: 9 %
Neutro Abs: 6.1 10*3/uL (ref 1.7–7.7)
Neutrophils Relative %: 69 %
Platelets: 190 10*3/uL (ref 150–400)
RBC: 3.16 MIL/uL — ABNORMAL LOW (ref 3.87–5.11)
RDW: 13.6 % (ref 11.5–15.5)
WBC: 8.7 10*3/uL (ref 4.0–10.5)
nRBC: 0 % (ref 0.0–0.2)

## 2021-09-29 LAB — GLUCOSE, CAPILLARY
Glucose-Capillary: 98 mg/dL (ref 70–99)
Glucose-Capillary: 93 mg/dL (ref 70–99)
Glucose-Capillary: 107 mg/dL — ABNORMAL HIGH (ref 70–99)

## 2021-09-29 LAB — BASIC METABOLIC PANEL
Anion gap: 8 (ref 5–15)
BUN: 19 mg/dL (ref 8–23)
CO2: 26 mmol/L (ref 22–32)
Calcium: 8.5 mg/dL — ABNORMAL LOW (ref 8.9–10.3)
Chloride: 104 mmol/L (ref 98–111)
Creatinine, Ser: 1.07 mg/dL — ABNORMAL HIGH (ref 0.44–1.00)
GFR, Estimated: 50 mL/min — ABNORMAL LOW (ref >60)
Glucose, Bld: 104 mg/dL — ABNORMAL HIGH (ref 70–99)
Potassium: 3.8 mmol/L (ref 3.5–5.1)
Sodium: 138 mmol/L (ref 135–145)

## 2021-09-29 MED ORDER — HYDROCODONE-ACETAMINOPHEN 5-325 MG PO TABS: 1.0000 | ORAL_TABLET | Freq: Four times a day (QID) | ORAL | 0 refills | Status: DC | PRN

## 2021-09-29 MED ORDER — VITAMIN D3 25 MCG PO TABS: 1000.0000 [IU] | ORAL_TABLET | Freq: Every day | ORAL | 2 refills | Status: DC

## 2021-09-29 MED ORDER — VITAMIN D3 25 MCG PO TABS: 1000.0000 [IU] | ORAL_TABLET | Freq: Every day | ORAL | 2 refills | Status: AC

## 2021-09-29 NOTE — Progress Notes (Signed)
Report received and care assumed from previous shift. VS obtained, shift assessments completed - see flowsheets. PRN pain medications given as needed for c/o R hip pain - see MAR. Patient refusing repositioning at this time. R hip dressings CDI. Incontinent care provided as needed. Patient able to sleep for approximately 6.5-7.5hr tonight. Patient currently resting in bed, bed in lowest position. Denies needs. Call bell within reach. Bedalarm in use at all times.

## 2021-09-29 NOTE — Progress Notes (Signed)
Orthopaedic Trauma Progress Note  SUBJECTIVE: Patient on EOB, trying to transfer to bedside chair with nursing staff. Notes pain in right hip as well as her right thumb, particularly when trying to move around and get situated in bed. Last had Tylenol around 0500. Would like something else for pain. Noted to have some soft BP readings yesterday afternoon, appears to be better this AM per chart review. No SOB. No nausea/vomiting. No other complaints.   OBJECTIVE:  Vitals:   09/29/21 0400 09/29/21 0700  BP: 121/86 (!) 135/57  Pulse: 80 77  Resp: 17 18  Temp: 98.3 F (36.8 C) 97.9 F (36.6 C)  SpO2: 95% 96%    General: Sitting up on EOB, no acute distress Respiratory: No increased work of breathing.  Right lower extremity: Dressing clean, dry, intact. Prefers to not have dressing changed right now. Tender over hip as expected.  Ankle dorsiflexion/plantarflexion intact.  Endorses sensation throughout extremity.  Foot warm and well-perfused.  Neurovascularly intact RUE: Bruising overt he thumb, tracking back toward the wrist. Able to wiggle fingers. Hand warm and well perfused.   IMAGING: Stable post op imaging.   LABS:  Results for orders placed or performed during the hospital encounter of 09/26/21 (from the past 24 hour(s))  Glucose, capillary     Status: Abnormal   Collection Time: 09/28/21 12:15 PM  Result Value Ref Range   Glucose-Capillary 110 (H) 70 - 99 mg/dL  Glucose, capillary     Status: Abnormal   Collection Time: 09/28/21  4:09 PM  Result Value Ref Range   Glucose-Capillary 100 (H) 70 - 99 mg/dL  Glucose, capillary     Status: None   Collection Time: 09/28/21  7:53 PM  Result Value Ref Range   Glucose-Capillary 98 70 - 99 mg/dL  CBC with Differential/Platelet     Status: Abnormal   Collection Time: 09/29/21  2:13 AM  Result Value Ref Range   WBC 8.7 4.0 - 10.5 K/uL   RBC 3.16 (L) 3.87 - 5.11 MIL/uL   Hemoglobin 9.2 (L) 12.0 - 15.0 g/dL   HCT 93.2 (L) 35.5 - 73.2 %    MCV 90.2 80.0 - 100.0 fL   MCH 29.1 26.0 - 34.0 pg   MCHC 32.3 30.0 - 36.0 g/dL   RDW 20.2 54.2 - 70.6 %   Platelets 190 150 - 400 K/uL   nRBC 0.0 0.0 - 0.2 %   Neutrophils Relative % 69 %   Neutro Abs 6.1 1.7 - 7.7 K/uL   Lymphocytes Relative 19 %   Lymphs Abs 1.7 0.7 - 4.0 K/uL   Monocytes Relative 9 %   Monocytes Absolute 0.8 0.1 - 1.0 K/uL   Eosinophils Relative 2 %   Eosinophils Absolute 0.2 0.0 - 0.5 K/uL   Basophils Relative 0 %   Basophils Absolute 0.0 0.0 - 0.1 K/uL   Immature Granulocytes 1 %   Abs Immature Granulocytes 0.05 0.00 - 0.07 K/uL  Basic metabolic panel     Status: Abnormal   Collection Time: 09/29/21  2:13 AM  Result Value Ref Range   Sodium 138 135 - 145 mmol/L   Potassium 3.8 3.5 - 5.1 mmol/L   Chloride 104 98 - 111 mmol/L   CO2 26 22 - 32 mmol/L   Glucose, Bld 104 (H) 70 - 99 mg/dL   BUN 19 8 - 23 mg/dL   Creatinine, Ser 2.37 (H) 0.44 - 1.00 mg/dL   Calcium 8.5 (L) 8.9 - 10.3 mg/dL  GFR, Estimated 50 (L) >60 mL/min   Anion gap 8 5 - 15  Glucose, capillary     Status: None   Collection Time: 09/29/21  6:34 AM  Result Value Ref Range   Glucose-Capillary 98 70 - 99 mg/dL    ASSESSMENT: [name] is a 87 y.o. female, 2 Days Post-Op s/p  INTRAMEDULLARY NAIL RIGHT INTERTROCHANTRIC FEMUR FRACTURE NON-OPERATIVE MANAGEMENT RIGHT THUMB DISTAL PHALANX FRACTURE  CV/Blood loss: Acute blood loss anemia, Hgb 9.2 this morning. Hemodynamically stable  PLAN: Weightbearing: WBAT RLE ROM: Okay for unrestricted range of motion of the hip and knee as tolerated Incisional and dressing care: Ok to remove dressings and leave incisions open to air Showering: Okay to begin showering with assistance 09/30/2021.  Incisions may get wet at this point Orthopedic device(s): No splint needed for right thumb fracture Pain management:  1. Tylenol 325-650 mg q 6 hours PRN 2. Norco 5-325 mg q 6 hours PRN 3. Morphine 0.5 mg q 2 hours PRN VTE prophylaxis:  Eliquis ,  SCDs ID:  Ancef 2gm post op completed Foley/Lines:  No foley, KVO IVFs Impediments to Fracture Healing: Vitamin D level 15, started on supplementation.  Recommend continue this at discharge Dispo: Therapies as tolerated. PT/OT recommending SNF. TOC following for placement. Okay for discharge from ortho standpoint once cleared by medicine team and therapies. Have signed and placed d/c rx for pain medication and Vit D3 in chart Follow - up plan: 2 weeks after discharge for repeat x-rays and wound check.  Contact information:  Katha Hamming MD, Patrecia Pace PA-C. After hours and holidays please check [email or web address] for group call information for [facility]   Monique Clemence A. Ricci Barker, PA-C [phone] (office) [email or web address]

## 2021-09-29 NOTE — TOC Initial Note (Signed)
Transition of Care [facility]) - Initial/Assessment Note    Patient Details  Name: [name] MRN: [id number] Date of Birth: [birth year]  Transition of Care [facility]) CM/SW Contact:    [name], LCSWA Phone Number: 09/29/2021, 12:02 PM  Clinical Narrative:                 CSW received consult for possible SNF placement at time of discharge. CSW spoke with patient. Patient was oriented x1 and was unable to provide needed information to CSW.  CSW contacted the patient's daughter, [name].  Ms. Inocencio expressed understanding of PT recommendation and is agreeable to SNF placement at time of discharge. Patient's daughter did not express a preference, but would like for the patient to be in a facility in [place]. CSW discussed insurance authorization process and will provide Medicare SNF ratings list. Patient has received 3 COVID vaccines. No further questions reported at this time.   Skilled Nursing Rehab Facilities-   [email or web address]   Ratings out of 5 possible   Name Address  Phone # Quality Care Staffing Health Inspection Overall  [facility] [address] [place] [phone] 5 5 2 4   [facility]  [address], [place] [phone] 4 2 5 5   [facility] [address] [place], [address] [place] 4 1 1 1   [facility] [address] 2 2 4 4   [facility] [address] [phone] 2 1 1 1   [facility] [phone] [address], [phone] 3 1 4 3   [facility] [address], [address] [place] 5 2 2 3   [facility] [address], [facility] [place] 4 1 2 1   [facility] [address] [facility] 5 1 2 2   [facility] [phone], [phone] 4 1 1 1   [facility] [address] [facility] [phone] 4 1 2 1   [facility]. [address] 4 1 1 1       Expected Discharge Plan: Assisted Living (vs SNF) Barriers to Discharge: Continued Medical Work up   Patient Goals and CMS Choice        Expected Discharge Plan and Services Expected Discharge Plan: Assisted Living (vs SNF)   Discharge Planning Services: CM Consult   Living arrangements for the past 2 months: Assisted Living Facility                                      Prior Living Arrangements/Services Living arrangements for the past 2 months: Assisted Living Facility Lives with:: Self Patient language and need for interpreter reviewed:: Yes Do you feel safe going back to the place where you live?: Yes      Need for Family Participation in Patient Care: Yes (Comment) Care giver support system in place?: Yes (comment)   Criminal Activity/Legal Involvement Pertinent to Current Situation/Hospitalization: No - Comment as needed  Activities of Daily Living   ADL Screening (condition at time of admission) Patient's cognitive ability adequate to safely complete daily activities?: No Weakness of Legs: Right  Permission Sought/Granted   Permission granted to share information with : Yes, Verbal Permission Granted  Share Information with NAME: [facility] (Daughter) [phone]           Emotional Assessment Appearance:: Appears stated age Attitude/Demeanor/Rapport: Gracious Affect (typically observed): Accepting Orientation: :  Oriented to Self, Oriented to Place, Oriented to  Time, Oriented to Situation Alcohol / Substance Use: Not Applicable Psych Involvement: No (comment)  Admission diagnosis:  Fall [W19.XXXA] Preop cardiovascular exam [id number] Closed right hip fracture, initial encounter [facility]) [S72.001A] Patient Active Problem List   Diagnosis Date Noted   Closed right hip fracture, initial encounter ([place]) 09/26/2021   Head trauma 09/26/2021   Hypocalcemia 09/26/2021   Hyperglycemia [id number]   Acute  metabolic encephalopathy [id number]   COVID-19 08/09/2021   History of syncope 08/13/2019   Syncope and collapse 01/23/2019   Leukocytosis 01/23/2019   Diarrhea 01/23/2019   Prolonged QT interval 01/23/2019   Mild cognitive impairment 07/15/2015   Acute confusional state 06/19/2015   Memory loss 06/19/2015   DM II (diabetes mellitus, type II), controlled ([place]) 05/14/2015   Hyperlipidemia 05/14/2015   Essential hypertension, benign    TIA (transient ischemic attack) 05/13/2015   Anemia, iron deficiency 12/12/2011   Humerus fracture 12/11/2011   Hypoxia 12/11/2011   Wide-complex tachycardia 12/11/2011   Acute hypotension 12/11/2011   PCP:  Monique Neer, MD Pharmacy:   [facility] [place], [address] [place] [address] Phone: [phone] Fax: [phone]  [id number] [place], [place] [id number] [place] [id number] Phone: [phone] Fax: [phone]     Social Determinants of Health (SDOH) Interventions    Readmission Risk Interventions No flowsheet data found.

## 2021-09-29 NOTE — Progress Notes (Signed)
Physical Therapy Treatment Patient Details Name: [name] MRN: [id number] DOB: 24-Feb-1932 Today's Date: 09/29/2021   History of Present Illness Pt is 87 y/o F admitted 11/6 s/p fall at SNF and found to have R hip fx and right thumb distal phalanx fracture. S/p R femur IM nailing 09/27/21. PMH includes CKD, Alzheimer's, HLD, HTN, osteoporosis, DM, OA. falls    PT Comments    The pt remains limited by significant pain in RLE even with pre-medication at this time, but was able to tolerate R knee flexion and x2 sit-stand transfers this morning. The pt requires increased time and effort to complete movements of trunk and LE exercises due to pain, but was able to manage without physical assist. The pt requires mod-maxA of 2 to complete sit-stand, but completed x2 during session with standing bouts of 10-30 sec and wt shifting. The pt will continue to benefit from skilled PT acutely and following d/c to maintain strength and improve capacity for OOB transfers and gait.     Recommendations for follow up therapy are one component of a multi-disciplinary discharge planning process, led by the attending physician.  Recommendations may be updated based on patient status, additional functional criteria and insurance authorization.  Follow Up Recommendations  Skilled nursing-short term rehab (<3 hours/day)     Assistance Recommended at Discharge Frequent or constant Supervision/Assistance  Equipment Recommendations  None recommended by PT    Recommendations for Other Services       Precautions / Restrictions Precautions Precautions: Fall;Other (comment) Precaution Comments: monitor O2 Restrictions Weight Bearing Restrictions: Yes RLE Weight Bearing: Weight bearing as tolerated Other Position/Activity Restrictions: R thumb is not commented on but is recent, assumed WBAT on walker     Mobility  Bed Mobility Overal bed mobility: Needs Assistance             General bed mobility  comments: pt OOB in recliner at start and end of session    Transfers Overall transfer level: Needs assistance Equipment used: 2 person hand held assist;None Transfers: Sit to/from Stand Sit to Stand: Mod assist;+2 physical assistance           General transfer comment: pt attempting to assist with LUE and LLE, but needing mod-maxA of 2 with use of cloth pad to elevate hips from recliner. pt then leaning to R in stance to rely on therapist for support. x2 in session    Ambulation/Gait             Pre-gait activities: lateral wt shifts, x10 at a time General Gait Details: unable to tolerate      Balance Overall balance assessment: Needs assistance Sitting-balance support: Bilateral upper extremity supported Sitting balance-Leahy Scale: Good Sitting balance - Comments: can hold good balance but is resistant to sitting up Postural control: Right lateral lean Standing balance support: Bilateral upper extremity supported;Reliant on assistive device for balance Standing balance-Leahy Scale: Poor Standing balance comment: dependent on BUE support and assist of 2                            Cognition Arousal/Alertness: Awake/alert Behavior During Therapy: Agitated;Restless Overall Cognitive Status: History of cognitive impairments - at baseline                                 General Comments: hx of Alzhiemer's, some word-finding issues noted through session (pt seems aware  at times she is saying the wrong word but is unable to make correction). also significant STM deficits as pt repeatedly taking sips of drinks, stating she dislikes it, and then taking another sip 2 min later surprised at the taste.        Exercises General Exercises - Lower Extremity Ankle Circles/Pumps: AROM;Both;15 reps;Seated Quad Sets: AROM;Left;10 reps;Seated Long Arc Quad: PROM;Right;10 reps;Seated Heel Slides: AROM;Left;10 reps;Seated Straight Leg Raises: AROM;Left;5  reps    General Comments General comments (skin integrity, edema, etc.): VSS      Pertinent Vitals/Pain Pain Assessment: Faces Faces Pain Scale: Hurts whole lot Pain Location: R thigh, hip, knee Pain Descriptors / Indicators: Grimacing;Guarding;Operative site guarding Pain Intervention(s): Limited activity within patient's tolerance;Monitored during session;Premedicated before session;Repositioned;Ice applied     PT Goals (current goals can now be found in the care plan section) Acute Rehab PT Goals Patient Stated Goal: none stated PT Goal Formulation: Patient unable to participate in goal setting Time For Goal Achievement: 10/12/21 Potential to Achieve Goals: Fair Progress towards PT goals: Progressing toward goals    Frequency    Min 5X/week      PT Plan Current plan remains appropriate       AM-PAC PT "6 Clicks" Mobility   Outcome Measure  Help needed turning from your back to your side while in a flat bed without using bedrails?: A Lot Help needed moving from lying on your back to sitting on the side of a flat bed without using bedrails?: A Lot Help needed moving to and from a bed to a chair (including a wheelchair)?: Total Help needed standing up from a chair using your arms (e.g., wheelchair or bedside chair)?: Total Help needed to walk in hospital room?: Total Help needed climbing 3-5 steps with a railing? : Total 6 Click Score: 8    End of Session Equipment Utilized During Treatment: Gait belt Activity Tolerance: Patient limited by pain Patient left: in chair;with chair alarm set;with call bell/phone within reach Nurse Communication: Mobility status PT Visit Diagnosis: Muscle weakness (generalized) (M62.81);Pain;Difficulty in walking, not elsewhere classified (R26.2) Pain - Right/Left: Right Pain - part of body: Hip;Knee;Leg     Time: 0240-9735 PT Time Calculation (min) (ACUTE ONLY): 34 min  Charges:  $Therapeutic Exercise: 8-22 mins $Therapeutic  Activity: 8-22 mins                     Vickki Muff, PT, DPT   Acute Rehabilitation Department Pager #: [phone]   Ronnie Derby 09/29/2021, 9:26 AM

## 2021-09-29 NOTE — [place] FL2 (Signed)
[place] MEDICAID FL2 LEVEL OF CARE SCREENING TOOL     IDENTIFICATION  Patient Name: [name] Birthdate: July 01, 1932 Sex: female Admission Date (Current Location): 09/26/2021  [facility] and [place] Number:  Producer, television/film/video and Address:  [facility] [place] [address], [place], [place] [address]      Provider Number: [id number]  Attending Physician Name and Address:  Lorin Glass, MD  Relative Name and Phone Number:  Christ Kick (Daughter)   [phone]    Current Level of Care: Hospital Recommended Level of Care: Skilled Nursing Facility Prior Approval Number:    Date Approved/Denied:   PASRR Number: [id number]  Discharge Plan: SNF    Current Diagnoses: Patient Active Problem List   Diagnosis Date Noted   Closed right hip fracture, initial encounter (HCC) 09/26/2021   Head trauma 09/26/2021   Hypocalcemia 09/26/2021   Hyperglycemia 09/26/2021   Acute metabolic encephalopathy 08/10/2021   COVID-19 08/09/2021   History of syncope 08/13/2019   Syncope and collapse 01/23/2019   Leukocytosis 01/23/2019   Diarrhea 01/23/2019   Prolonged QT interval 01/23/2019   Mild cognitive impairment 07/15/2015   Acute confusional state 06/19/2015   Memory loss 06/19/2015   DM II (diabetes mellitus, type II), controlled (HCC) 05/14/2015   Hyperlipidemia 05/14/2015   Essential hypertension, benign    TIA (transient ischemic attack) 05/13/2015   Anemia, iron deficiency 12/12/2011   Humerus fracture 12/11/2011   Hypoxia 12/11/2011   Wide-complex tachycardia 12/11/2011   Acute hypotension 12/11/2011    Orientation RESPIRATION BLADDER Height & Weight     Self  Normal Incontinent Weight: 163 lb 2.3 oz (74 kg) Height:  5\' 4"  (162.6 cm)  BEHAVIORAL SYMPTOMS/MOOD NEUROLOGICAL BOWEL NUTRITION STATUS      Continent Diet (see d/c summary)  AMBULATORY STATUS COMMUNICATION OF NEEDS Skin   Extensive Assist Verbally Surgical wounds                        Personal Care Assistance Level of Assistance  Bathing, Feeding, Dressing Bathing Assistance: Maximum assistance Feeding assistance: Limited assistance       Functional Limitations Info  Sight, Hearing, Speech Sight Info: Adequate Hearing Info: Adequate Speech Info: Adequate    SPECIAL CARE FACTORS FREQUENCY  PT (By licensed PT), OT (By licensed OT)     PT Frequency: 5x/ week OT Frequency: 5x/ week            Contractures Contractures Info: Not present    Additional Factors Info  Code Status, Allergies Code Status Info: Full Allergies Info: Sulfa Antibiotics   Latex           Current Medications (09/29/2021):  This is the current hospital active medication list Current Facility-Administered Medications  Medication Dose Route Frequency Provider Last Rate Last Admin   acetaminophen (TYLENOL) tablet 325-650 mg  325-650 mg Oral Q6H PRN 13/07/2021, PA-C   650 mg at 09/29/21 0436   apixaban (ELIQUIS) tablet 2.5 mg  2.5 mg Oral BID 13/09/22, RPH   2.5 mg at 09/29/21 1026   cholecalciferol (VITAMIN D3) tablet 1,000 Units  1,000 Units Oral Daily Dahal, 13/09/22, MD   1,000 Units at 09/29/21 1026   diphenhydrAMINE (BENADRYL) 12.5 MG/5ML elixir 12.5-25 mg  12.5-25 mg Oral Q4H PRN 12-21-1984, PA-C       docusate sodium (COLACE) capsule 100 mg  100 mg Oral BID Despina Hidden A, PA-C   100 mg at 09/29/21  1026   feeding supplement (ENSURE ENLIVE / ENSURE PLUS) liquid 237 mL  237 mL Oral BID BM Delray Alt, PA-C   237 mL at 09/29/21 1027   HYDROcodone-acetaminophen (NORCO/VICODIN) 5-325 MG per tablet 1-2 tablet  1-2 tablet Oral Q6H PRN Delray Alt, PA-C   1 tablet at 09/27/21 2110   magnesium oxide (MAG-OX) tablet 400 mg  400 mg Oral BID Delray Alt, PA-C   400 mg at 09/29/21 1026   melatonin tablet 3 mg  3 mg Oral QHS Yacobi, Sarah A, PA-C   3 mg at 09/28/21 2106   memantine (NAMENDA) tablet 10 mg  10 mg Oral BID Delray Alt, PA-C   10 mg at  09/29/21 1026   metoCLOPramide (REGLAN) tablet 5-10 mg  5-10 mg Oral Q8H PRN Delray Alt, PA-C       Or   metoCLOPramide (REGLAN) injection 5-10 mg  5-10 mg Intravenous Q8H PRN Patrecia Pace A, PA-C       metoprolol tartrate (LOPRESSOR) tablet 25 mg  25 mg Oral BID Patrecia Pace A, PA-C   25 mg at 09/29/21 1026   morphine 2 MG/ML injection 0.5 mg  0.5 mg Intravenous Q2H PRN Delray Alt, PA-C   0.5 mg at 09/29/21 0824   ondansetron (ZOFRAN) tablet 4 mg  4 mg Oral Q6H PRN Delray Alt, PA-C       Or   ondansetron (ZOFRAN) injection 4 mg  4 mg Intravenous Q6H PRN Patrecia Pace A, PA-C       polyethylene glycol (MIRALAX / GLYCOLAX) packet 17 g  17 g Oral Daily PRN Delray Alt, PA-C       senna-docusate (Senokot-S) tablet 1 tablet  1 tablet Oral BID Delray Alt, PA-C   1 tablet at 09/29/21 1026   sertraline (ZOLOFT) tablet 100 mg  100 mg Oral QHS Patrecia Pace A, PA-C   100 mg at 09/28/21 2106   simvastatin (ZOCOR) tablet 20 mg  20 mg Oral q1800 Delray Alt, PA-C   20 mg at 09/28/21 1724   vitamin B-12 (CYANOCOBALAMIN) tablet 1,000 mcg  1,000 mcg Oral BID Delray Alt, PA-C   1,000 mcg at 09/29/21 1026     Discharge Medications: Please see discharge summary for a list of discharge medications.  Relevant Imaging Results:  Relevant Lab Results:   Additional Information [phone],  5'4" 163lbs, patient has had 2 COVID vaccines and a booster  Lynnell Fiumara F Areej Tayler, LCSWA

## 2021-09-29 NOTE — Care Management Important Message (Signed)
Important Message  Patient Details  Name: [name] MRN: [id number] Date of Birth: 16-Sep-1932   Medicare Important Message Given:  Yes     Dorena Bodo 09/29/2021, 2:48 PM

## 2021-09-29 NOTE — Plan of Care (Signed)

## 2021-09-29 NOTE — Plan of Care (Signed)

## 2021-09-29 NOTE — Discharge Instructions (Signed)
[facility]th[facility]edic Trauma [facility] Discharge Instructions   General Discharge Instructions  WEIGHT BEARING STATUS:weightbearing as tolerated right upper and lower extremity  RANGE OF MOTION/ACTIVITY:Ok for unrestricted ROM  Wound Care: Incisions can be left open to air if there is no drainage. If incision continues to have drainage, follow wound care instructions below. Okay to shower if no drainage from incisions.  DVT/PE prophylaxis:  Eliquis  Diet: as you were eating previously.  Can use over the counter stool softeners and bowel preparations, such as Miralax, to help with bowel movements.  Narcotics can be constipating.  Be sure to drink plenty of fluids  PAIN MEDICATION USE AND EXPECTATIONS  You have likely been given narcotic medications to help control your pain.  After a traumatic event that results in an fracture (broken bone) with or without surgery, it is ok to use narcotic pain medications to help control one's pain.  We understand that everyone responds to pain differently and each individual patient will be evaluated on a regular basis for the continued need for narcotic medications. Ideally, narcotic medication use should last no more than 6-8 weeks (coinciding with fracture healing).   As a patient it is your responsibility as well to monitor narcotic medication use and report the amount and frequency you use these medications when you come to your office visit.   We would also advise that if you are using narcotic medications, you should take a dose prior to therapy to maximize you participation.  IF YOU ARE ON NARCOTIC MEDICATIONS IT IS NOT PERMISSIBLE TO OPERATE A MOTOR VEHICLE (MOTORCYCLE/CAR/TRUCK/MOPED) OR HEAVY MACHINERY DO NOT MIX NARCOTICS WITH OTHER CNS (CENTRAL NERVOUS SYSTEM) DEPRESSANTS SUCH AS ALCOHOL   STOP SMOKING OR USING NICOTINE PRODUCTS!!!!  As discussed nicotine severely impairs your body's ability to heal surgical and traumatic wounds but also impairs bone  healing.  Wounds and bone heal by forming microscopic blood vessels (angiogenesis) and nicotine is a vasoconstrictor (essentially, shrinks blood vessels).  Therefore, if vasoconstriction occurs to these microscopic blood vessels they essentially disappear and are unable to deliver necessary nutrients to the healing tissue.  This is one modifiable factor that you can do to dramatically increase your chances of healing your injury.    (This means no smoking, no nicotine gum, patches, etc)  DO NOT USE NONSTEROIDAL ANTI-INFLAMMATORY DRUGS (NSAID'S)  Using products such as Advil (ibuprofen), Aleve (naproxen), Motrin (ibuprofen) for additional pain control during fracture healing can delay and/or prevent the healing response.  If you would like to take over the counter (OTC) medication, Tylenol (acetaminophen) is ok.  However, some narcotic medications that are given for pain control contain acetaminophen as well. Therefore, you should not exceed more than 4000 mg of tylenol in a day if you do not have liver disease.  Also note that there are may OTC medicines, such as cold medicines and allergy medicines that my contain tylenol as well.  If you have any questions about medications and/or interactions please ask your doctor/PA or your pharmacist.      ICE AND ELEVATE INJURED/OPERATIVE EXTREMITY  Using ice and elevating the injured extremity above your heart can help with swelling and pain control.  Icing in a pulsatile fashion, such as 20 minutes on and 20 minutes off, can be followed.    Do not place ice directly on skin. Make sure there is a barrier between to skin and the ice pack.    Using frozen items such as frozen peas works well as the  conform nicely to the are that needs to be iced.  USE AN ACE WRAP OR TED HOSE FOR SWELLING CONTROL  In addition to icing and elevation, Ace wraps or TED hose are used to help limit and resolve swelling.  It is recommended to use Ace wraps or TED hose until you are  informed to stop.    When using Ace Wraps start the wrapping distally (farthest away from the body) and wrap proximally (closer to the body)   Example: If you had surgery on your leg or thing and you do not have a splint on, start the ace wrap at the toes and work your way up to the thigh        If you had surgery on your upper extremity and do not have a splint on, start the ace wrap at your fingers and work your way up to the upper arm   CALL THE OFFICE WITH ANY QUESTIONS OR CONCERNS: [phone]   VISIT OUR WEBSITE FOR ADDITIONAL INFORMATION: [email or web address]    Discharge Wound Care Instructions  Do NOT apply any ointments, solutions or lotions to pin sites or surgical wounds.  These prevent needed drainage and even though solutions like hydrogen peroxide kill bacteria, they also damage cells lining the pin sites that help fight infection.  Applying lotions or ointments can keep the wounds moist and can cause them to breakdown and open up as well. This can increase the risk for infection. When in doubt call the office.  If any drainage is noted, use foam dressing  Once the incision is completely dry and without drainage, it may be left open to air out.  Showering may begin 36-48 hours later.  Cleaning gently with soap and water.

## 2021-09-29 NOTE — Progress Notes (Signed)
PROGRESS NOTE  [name]  DOB: [birth year]  PCP: Mayra Neer, MD [id number]  DOA: 09/26/2021  LOS: 3 days  Hospital Day: 4  Chief Complaint  Patient presents with   Fall   Hip Pain    Brief narrative: [name] is a 87 y.o. female with PMH significant for DM2, HTN, wide-complex tachycardia, history of syncope, mild cognitive impairment, history of COVID-19  Patient was brought to the ED from an assisted living facility after having an unwitnessed fall hitting her head on a carpeted floor and losing consciousness for about 2 minutes.  She also had pain in the right hip area. Imaging in the ED showed right hip comminuted displaced intertrochanteric fracture. Admitted to hospitalist service. Orthopedic consulted  Subjective: Patient was seen and examined this morning.   Sitting up in chair.  Not in distress.  No new symptoms.  Waiting for placement.  Assessment/Plan: Closed right hip fracture -Noted a plan for cephalomedullary nailing today. -Pain management and DVT prophylaxis per orthopedics team.  Currently on Eliquis -Postop PT eval obtained.  SNF recommended  Right thumb phalanx fracture -Patient had an x-ray done prior to admission on 11/4 that noted a comminuted, nondisplaced and nonangulated fracture of the distal phalanx right thumb.  Static finger splint was placed in the ED. currently does not have splint. -Per orthopedics note, thumb fracture does not require splint.  Vitamin D deficiency -Vitamin D level low at 15.  Oral supplement started.  AKI -Improved with IV fluid.  Stop IV fluid.  Encourage oral hydration. Recent Labs    08/10/21 0300 08/11/21 0017 08/12/21 0404 08/12/21 2307 08/13/21 0706 08/16/21 0339 09/22/21 1415 09/24/21 1104 09/26/21 0742 09/28/21 0518 09/29/21 0213  BUN 22 30* 30* 33* 32*  --  12 9 12 21 19   CREATININE 1.02* 1.24* 1.11* 1.05* 1.05* 1.26* 1.33* 1.01* 0.96 1.28* 1.07*    Type 2 diabetes mellitus -A1c  5.5.  Diet controlled -Currently on sliding scale insulin -Continue to monitor blood sugar level. Recent Labs  Lab 09/28/21 0626 09/28/21 1215 09/28/21 1609 09/28/21 1953 09/29/21 0634  GLUCAP 126* 110* 100* 98 98   Wide-complex tachycardia -Continue metoprolol 25 mg p.o. twice daily. -Keep electrolytes optimized.  Essential hypertension -Continue metoprolol. -Monitor BP and heart rate.   Mild cognitive impairment -Continue on memantine 10 mg p.o. daily. -Continue sertraline 100 mg p.o. bedtime.   Frequent falls -PT eval postprocedure.  Noted history of frequent falls  Living condition: Assisted living facility Goals of care:   Code Status: Full Code  Nutritional status: Body mass index is 28 kg/m. Nutrition Problem: Increased nutrient needs Etiology: hip fracture Signs/Symptoms: estimated needs Diet:  Diet Order             Diet Carb Modified Fluid consistency: Thin; Room service appropriate? Yes  Diet effective now                  DVT prophylaxis:  apixaban (ELIQUIS) tablet 2.5 mg Start: 09/28/21 1000 SCDs Start: 09/27/21 1539 SCDs Start: 09/26/21 1156 apixaban (ELIQUIS) tablet 2.5 mg   Antimicrobials: None Fluid: None Consultants: Orthopedics Family Communication: None at bedside  Status is: Inpatient  Remains inpatient appropriate because: Pending SNF  Dispo: The patient is from: Assisted living facility              Anticipated d/c is to: SNF              Patient currently is medically stable to d/c.  Difficult to place patient No     Infusions:     Scheduled Meds:  apixaban  2.5 mg Oral BID   cholecalciferol  1,000 Units Oral Daily   docusate sodium  100 mg Oral BID   feeding supplement  237 mL Oral BID BM   magnesium oxide  400 mg Oral BID   melatonin  3 mg Oral QHS   memantine  10 mg Oral BID   metoprolol tartrate  25 mg Oral BID   potassium chloride  40 mEq Oral Once   senna-docusate  1 tablet Oral BID   sertraline  100 mg  Oral QHS   simvastatin  20 mg Oral q1800   vitamin B-12  1,000 mcg Oral BID    PRN meds: acetaminophen, diphenhydrAMINE, HYDROcodone-acetaminophen, metoCLOPramide **OR** metoCLOPramide (REGLAN) injection, morphine injection, ondansetron **OR** ondansetron (ZOFRAN) IV, polyethylene glycol   Antimicrobials: Anti-infectives (From admission, onward)    Start     Dose/Rate Route Frequency Ordered Stop   09/27/21 2200  ceFAZolin (ANCEF) IVPB 2g/100 mL premix        2 g 200 mL/hr over 30 Minutes Intravenous Every 8 hours 09/27/21 1538 09/28/21 1442   09/27/21 1100  ceFAZolin (ANCEF) IVPB 2g/100 mL premix        2 g 200 mL/hr over 30 Minutes Intravenous To [facility] 09/26/21 1803 09/27/21 1330       Objective: Vitals:   09/29/21 0400 09/29/21 0700  BP: 121/86 (!) 135/57  Pulse: 80 77  Resp: 17 18  Temp: 98.3 F (36.8 C) 97.9 F (36.6 C)  SpO2: 95% 96%    Intake/Output Summary (Last 24 hours) at 09/29/2021 0946 Last data filed at 09/29/2021 0657 Gross per 24 hour  Intake 1597.53 ml  Output 900 ml  Net 697.53 ml   Filed Weights   09/26/21 0722 09/27/21 1144  Weight: 74 kg 74 kg   Weight change:  Body mass index is 28 kg/m.   Physical Exam: General exam: Pleasant, elderly Caucasian female.  Not in distress Skin: No rashes, lesions or ulcers. HEENT: Atraumatic, normocephalic, no obvious bleeding Lungs: Clear to auscultation bilaterally CVS: Regular rate and rhythm, no murmur GI/Abd soft, nontender, nondistended, bowel sound present CNS: Alert, awake, oriented to place and person.  Inconsistent details of history Psychiatry: Mood appropriate Extremities: No pedal edema, no calf tenderness  Data Review: I have personally reviewed the laboratory data and studies available.  F/u labs ordered Unresulted Labs (From admission, onward)     Start     Ordered   09/28/21 0500  CBC with Differential/Platelet  Daily,   R      09/27/21 1408   09/28/21 0500  Basic  metabolic panel  Daily,   R      09/27/21 1408   09/26/21 0736  Urinalysis, Routine w reflex microscopic  Once,   R        09/26/21 0736            Signed, Lorin Glass, MD [facility] 09/29/2021

## 2021-09-30 LAB — RESP PANEL BY RT-PCR (FLU A&B, COVID) ARPGX2
Influenza A by PCR: NEGATIVE
Influenza B by PCR: NEGATIVE
SARS Coronavirus 2 by RT PCR: NEGATIVE

## 2021-09-30 LAB — BASIC METABOLIC PANEL
Anion gap: 6 (ref 5–15)
BUN: 17 mg/dL (ref 8–23)
CO2: 27 mmol/L (ref 22–32)
Calcium: 8.6 mg/dL — ABNORMAL LOW (ref 8.9–10.3)
Chloride: 103 mmol/L (ref 98–111)
Creatinine, Ser: 0.83 mg/dL (ref 0.44–1.00)
GFR, Estimated: >60 mL/min (ref >60)
Glucose, Bld: 111 mg/dL — ABNORMAL HIGH (ref 70–99)
Potassium: 4.6 mmol/L (ref 3.5–5.1)
Sodium: 136 mmol/L (ref 135–145)

## 2021-09-30 LAB — CBC WITH DIFFERENTIAL/PLATELET
Abs Immature Granulocytes: 0.06 10*3/uL (ref 0.00–0.07)
Basophils Absolute: 0.1 10*3/uL (ref 0.0–0.1)
Basophils Relative: 1 %
Eosinophils Absolute: 0.4 10*3/uL (ref 0.0–0.5)
Eosinophils Relative: 4 %
HCT: 27.9 % — ABNORMAL LOW (ref 36.0–46.0)
Hemoglobin: 8.8 g/dL — ABNORMAL LOW (ref 12.0–15.0)
Immature Granulocytes: 1 %
Lymphocytes Relative: 26 %
Lymphs Abs: 2.3 10*3/uL (ref 0.7–4.0)
MCH: 28.7 pg (ref 26.0–34.0)
MCHC: 31.5 g/dL (ref 30.0–36.0)
MCV: 90.9 fL (ref 80.0–100.0)
Monocytes Absolute: 1 10*3/uL (ref 0.1–1.0)
Monocytes Relative: 11 %
Neutro Abs: 5 10*3/uL (ref 1.7–7.7)
Neutrophils Relative %: 57 %
Platelets: 187 10*3/uL (ref 150–400)
RBC: 3.07 MIL/uL — ABNORMAL LOW (ref 3.87–5.11)
RDW: 14 % (ref 11.5–15.5)
WBC: 8.7 10*3/uL (ref 4.0–10.5)
nRBC: 0 % (ref 0.0–0.2)

## 2021-09-30 LAB — GLUCOSE, CAPILLARY
Glucose-Capillary: 101 mg/dL — ABNORMAL HIGH (ref 70–99)
Glucose-Capillary: 92 mg/dL (ref 70–99)

## 2021-09-30 MED ORDER — ENSURE ENLIVE PO LIQD: 237.0000 mL | Freq: Two times a day (BID) | ORAL | 12 refills | Status: AC

## 2021-09-30 MED ORDER — POLYETHYLENE GLYCOL 3350 17 G PO PACK: 17.0000 g | PACK | Freq: Every day | ORAL | 0 refills | Status: DC | PRN

## 2021-09-30 MED ORDER — SENNOSIDES-DOCUSATE SODIUM 8.6-50 MG PO TABS: 1.0000 | ORAL_TABLET | Freq: Two times a day (BID) | ORAL | Status: DC

## 2021-09-30 NOTE — Progress Notes (Incomplete)
OT Cancellation Note  Patient Details Name: [name] MRN: [id number] DOB: 09-19-1932   Cancelled Treatment:    Reason Eval/Treat Not Completed: Pain limiting ability to participate. Pt with even light touch reporting pain at this time. Nursing aware.   Alphia Moh OTR/L  Acute Rehab Services  [phone] office number [phone] pager number   Alphia Moh 09/30/2021, 9:34 AM

## 2021-09-30 NOTE — Progress Notes (Signed)
Mobility Specialist Progress Note   09/30/21 1532  Mobility  Activity Dangled on edge of bed  Level of Assistance Maximum assist, patient does 25-49% (+2)  RLE Weight Bearing WBAT  Mobility Sit up in bed/chair position for meals  Mobility Response Tolerated fair  Mobility performed by Mobility specialist  $Mobility charge 1 Mobility   Received pt in bed having no initial complaints and agreeable to mobility. Bed mobility is maxA expressing intense pain during transfer. While sitting EOB pt was able to hold themselves up for ~12mns, practice passive LLE exercise w/ minimal tolerance for range. Returned to supine w/ call bell by side and all needs met.  JHolland FallingMobility Specialist Phone Number [phone]

## 2021-09-30 NOTE — Discharge Summary (Signed)
Physician Discharge Summary  [name] [name] DOB: 1932/09/19 DOA: 09/26/2021  PCP: Mayra Neer, MD  Admit date: 09/26/2021 Discharge date: 09/30/2021  Admitted From: Home Discharge disposition: SNF   Code Status: Full Code   Discharge Diagnosis:   Principal Problem:   Closed right hip fracture, initial encounter ([place]) Active Problems:   Wide-complex tachycardia   DM II (diabetes mellitus, type II), controlled ([place])   Essential hypertension, benign   Mild cognitive impairment   Head trauma   Hypocalcemia   Hyperglycemia   Chief Complaint  Patient presents with   Fall   Hip Pain    Brief narrative: [name] is a 87 y.o. female with PMH significant for DM2, HTN, wide-complex tachycardia, history of syncope, mild cognitive impairment, history of COVID-19  Patient was brought to the ED from an assisted living facility after having an unwitnessed fall hitting her head on a carpeted floor and losing consciousness for about 2 minutes.  She also had pain in the right hip area. Imaging in the ED showed right hip comminuted displaced intertrochanteric fracture. Admitted to hospitalist service. Orthopedic consulted  Subjective: Patient was seen and examined this morning. Propped up in bed.  Not in distress.  No new symptoms. Sitting up in chair.  Not in distress.  No new symptoms.  Waiting for placement.  Hospital course: Closed right hip fracture -Underwent cephalomedullary nailing. -Pain management and DVT prophylaxis per orthopedics team.  Currently on Norco and Eliquis -Postop PT eval obtained.  SNF recommended  Right thumb phalanx fracture -Patient had an x-ray done prior to admission on 11/4 that noted a comminuted, nondisplaced and nonangulated fracture of the distal phalanx right thumb.  Static finger splint was placed in the ED. currently does not have splint. -Per orthopedics note, thumb fracture does not require splint.  Vitamin D  deficiency -Vitamin D level low at 15.  Oral supplement started.  AKI -Improved with IV fluid.   Type 2 diabetes mellitus -A1c 5.5.  Diet controlled  Wide-complex tachycardia -Continue metoprolol 25 mg p.o. twice daily. -It seems patient was on aspirin 325 mg daily as tolerated Eliquis 2.5 mg daily at home.  Essential hypertension -Continue metoprolol. -Monitor BP and heart rate.   Mild cognitive impairment -Continue on memantine 10 mg p.o. daily. -Continue sertraline 100 mg p.o. bedtime.   Frequent falls -PT eval postprocedure.  Noted history of frequent falls   Allergies as of 09/30/2021       Reactions   Sulfa Antibiotics Hives, Diarrhea, Nausea And Vomiting, Swelling   Facial swelling ALSO   Latex Rash        Medication List     STOP taking these medications    cephALEXin 500 MG capsule Commonly known as: KEFLEX       TAKE these medications    acetaminophen 500 MG tablet Commonly known as: TYLENOL Take 500 mg by mouth every 8 (eight) hours as needed for mild pain.   acetaminophen 325 MG tablet Commonly known as: Tylenol Take 1 tablet (325 mg total) by mouth every 6 (six) hours as needed for moderate pain.   apixaban 2.5 MG Tabs tablet Commonly known as: ELIQUIS Take 1 tablet (2.5 mg total) by mouth 2 (two) times daily.   aspirin 325 MG tablet Take 325 mg by mouth daily.   Calcium Carbonate-Vitamin D3 600-400 MG-UNIT Tabs Take 2 tablets by mouth every morning.   docusate sodium 100 MG capsule Commonly known as: COLACE Take 1 capsule (100  mg total) by mouth every 12 (twelve) hours. What changed:  when to take this additional instructions   feeding supplement Liqd Take 237 mLs by mouth 2 (two) times daily between meals.   HYDROcodone-acetaminophen 5-325 MG tablet Commonly known as: NORCO/VICODIN Take 1 tablet by mouth every 6 (six) hours as needed for severe pain.   melatonin 3 MG Tabs tablet Take 3 mg by mouth at bedtime.    memantine 10 MG tablet Commonly known as: NAMENDA Take 10 mg by mouth 2 (two) times daily.   metoprolol tartrate 25 MG tablet Commonly known as: LOPRESSOR Take 0.5 tablets (12.5 mg total) by mouth 2 (two) times daily. What changed: how much to take   polyethylene glycol 17 g packet Commonly known as: MIRALAX / GLYCOLAX Take 17 g by mouth daily as needed for mild constipation.   senna-docusate 8.6-50 MG tablet Commonly known as: Senokot-S Take 1 tablet by mouth 2 (two) times daily.   sertraline 100 MG tablet Commonly known as: ZOLOFT Take 100 mg by mouth at bedtime.   simvastatin 20 MG tablet Commonly known as: ZOCOR Take 1 tablet (20 mg total) by mouth daily at 6 PM.   vitamin B-12 1000 MCG tablet Commonly known as: CYANOCOBALAMIN Take 1,000 mcg by mouth 2 (two) times daily.   Vitamin D3 25 MCG tablet Commonly known as: Vitamin D Take 1 tablet (1,000 Units total) by mouth daily.               Discharge Care Instructions  (From admission, onward)           Start     Ordered   09/30/21 0000  No dressing needed        09/30/21 0945            Discharge Instructions:  Diet Recommendation:  Discharge Diet Orders (From admission, onward)     Start     Ordered   09/30/21 0000  Diet Carb Modified        09/30/21 0945   09/30/21 0000  Diet - low sodium heart healthy        09/30/21 0945              @BRDDSCINSTRUCTIONS @  Follow ups:    Follow-up Information     Haddix, , MD. Schedule an appointment as soon as possible for a visit in 2 week(s).   Specialty: Orthopedic Surgery Why: for owund check and repeat x-rays Contact information: [address] [place] [phone]         [phone], MD Follow up.   Specialty: Family Medicine Contact information: [address] [place] [phone]         [phone], MD .   Specialty: Cardiology Contact  information: [address] [place] [address] [place] [phone]                 Wound care:   Incision (Closed) 09/27/21 Hip Right (Active)  Date First Assessed/Time First Assessed: 09/27/21 1357   Location: Hip  Location Orientation: Right    Assessments 09/27/2021  2:35 PM 09/30/2021  8:00 AM  Dressing Type Adhesive bandage Other (Comment)  Dressing Clean;Dry;Intact Clean;Dry;Intact  Site / Wound Assessment Dressing in place / Unable to assess Dressing in place / Unable to assess  Drainage Amount None None  Treatment -- Off loading;Ice applied     No Linked orders to display    Discharge Exam:  Vitals:   09/29/21 0700 09/29/21 1500 09/29/21 2022 09/30/21 0751  BP: (!) 135/57 (!) 137/54 (!) 146/48 140/67  Pulse: 77 80 87 71  Resp: 18 18 17 16   Temp: 97.9 F (36.6 C) 98 F (36.7 C) 98.1 F (36.7 C) 97.6 F (36.4 C)  TempSrc: Oral Oral Oral Oral  SpO2: 96% 98% 93% 93%  Weight:      Height:        Body mass index is 28 kg/m.  General exam: Pleasant, elderly Caucasian female.  Propped up in bed.  Not in distress Skin: No rashes, lesions or ulcers. HEENT: Atraumatic, normocephalic, no obvious bleeding Lungs: Clear to auscultation bilaterally CVS: Regular rate and rhythm, no murmur GI/Abd soft, nontender, nondistended, bowel sound present CNS: Alert, awake, oriented x3 Psychiatry: Mood appropriate Extremities: No pedal edema, no calf tenderness  Time coordinating discharge: 35 minutes   The results of significant diagnostics from this hospitalization (including imaging, microbiology, ancillary and laboratory) are listed below for reference.    Procedures and Diagnostic Studies:   DG Chest 1 View  Result Date: 09/26/2021 CLINICAL DATA:  Fall this morning. Right hip fracture. Pre-op clearance exam EXAM: CHEST  1 VIEW COMPARISON:  None. FINDINGS: The heart size and mediastinal contours are within normal limits. Aortic atherosclerotic  calcification noted. Both lungs are clear. The visualized skeletal structures are unremarkable. IMPRESSION: No active disease. Electronically Signed   By: Marlaine Hind M.D.   On: 09/26/2021 08:50   DG Lumbar Spine 2-3 Views  Result Date: 09/26/2021 CLINICAL DATA:  Fall this morning. Low back pain. Initial encounter. EXAM: LUMBAR SPINE - 2-3 VIEW COMPARISON:  None. FINDINGS: Posterior elements are not well visualized on the cross-table lateral projection due to artifact from the patient's mattress and bending. There is no evidence of lumbar vertebral body fracture. Alignment is normal. Intervertebral disc spaces are maintained. Lower lumbar facet DJD is noted at L4-5 and L5-S1 bilaterally. Aortic atherosclerotic calcification noted. IMPRESSION: No acute findings. Lower lumbar facet DJD. Electronically Signed   By: Marlaine Hind M.D.   On: 09/26/2021 08:49   DG Knee 1-2 Views Right  Result Date: 09/26/2021 CLINICAL DATA:  Fall this morning. Right knee pain. Initial encounter. EXAM: RIGHT KNEE - 2 VIEW COMPARISON:  None. FINDINGS: No evidence of fracture, dislocation, or joint effusion. Mild osteoarthritis is seen involving the medial compartment. Enthesopathic changes are seen involving the patella. No other osseous abnormality identified. IMPRESSION: No acute findings. Mild medial compartment osteoarthritis. Electronically Signed   By: Marlaine Hind M.D.   On: 09/26/2021 08:48   CT Head Wo Contrast  Result Date: 09/26/2021 CLINICAL DATA:  Fall this morning. Head and neck trauma. Initial encounter. EXAM: CT HEAD WITHOUT CONTRAST CT CERVICAL SPINE WITHOUT CONTRAST TECHNIQUE: Multidetector CT imaging of the head and cervical spine was performed following the standard protocol without intravenous contrast. Multiplanar CT image reconstructions of the cervical spine were also generated. COMPARISON:  09/24/2021 FINDINGS: CT HEAD FINDINGS Brain: No evidence of acute infarction, hemorrhage, hydrocephalus,  extra-axial collection, or mass lesion/mass effect. Stable moderate diffuse cerebral atrophy and chronic small vessel disease. Old infarcts are again seen involving the left cerebellum. Vascular:  No hyperdense vessel or other acute findings. Skull: No evidence of fracture or other significant bone abnormality. Sinuses/Orbits:  No acute findings. Other: None. CT CERVICAL SPINE FINDINGS Alignment: Normal. Skull base and vertebrae: No acute fracture. No primary bone lesion or focal pathologic process. Soft tissues and spinal canal: No prevertebral fluid or  swelling. No visible canal hematoma. Disc levels: Mild-to-moderate degenerative disc disease is seen at all cervical levels. Mild bilateral facet DJD also noted. Upper chest: No acute findings. Other: None. IMPRESSION: No acute intracranial abnormality. 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 No evidence of acute cervical spine fracture or subluxation. Degenerative spondylosis, as described above. Electronically Signed   By: Marlaine Hind M.D.   On: 09/26/2021 09:36   CT Cervical Spine Wo Contrast  Result Date: 09/26/2021 CLINICAL DATA:  Fall this morning. Head and neck trauma. Initial encounter. EXAM: CT HEAD WITHOUT CONTRAST CT CERVICAL SPINE WITHOUT CONTRAST TECHNIQUE: Multidetector CT imaging of the head and cervical spine was performed following the standard protocol without intravenous contrast. Multiplanar CT image reconstructions of the cervical spine were also generated. COMPARISON:  09/24/2021 FINDINGS: CT HEAD FINDINGS Brain: No evidence of acute infarction, hemorrhage, hydrocephalus, extra-axial collection, or mass lesion/mass effect. Stable moderate diffuse cerebral atrophy and chronic small vessel disease. Old infarcts are again seen involving the left cerebellum. Vascular:  No hyperdense vessel or other acute findings. Skull: No evidence of fracture or other significant bone abnormality.  Sinuses/Orbits:  No acute findings. Other: None. CT CERVICAL SPINE FINDINGS Alignment: Normal. Skull base and vertebrae: No acute fracture. No primary bone lesion or focal pathologic process. Soft tissues and spinal canal: No prevertebral fluid or swelling. No visible canal hematoma. Disc levels: Mild-to-moderate degenerative disc disease is seen at all cervical levels. Mild bilateral facet DJD also noted. Upper chest: No acute findings. Other: None. IMPRESSION: No acute intracranial abnormality. 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 No evidence of acute cervical spine fracture or subluxation. Degenerative spondylosis, as described above. Electronically Signed   By: Marlaine Hind M.D.   On: 09/26/2021 09:36   DG C-Arm 1-60 Min-No Report  Result Date: 09/27/2021 Fluoroscopy was utilized by the requesting physician.  No radiographic interpretation.   DG HIP PORT UNILAT W OR W/O PELVIS 1V RIGHT  Result Date: 09/27/2021 CLINICAL DATA:  Postop EXAM: DG HIP (WITH OR WITHOUT PELVIS) 1V PORT RIGHT COMPARISON:  09/26/2021 FINDINGS: Interval intramedullary rodding of right femur for comminuted intertrochanteric fracture. Mildly displaced lesser trochanteric fracture fragment. Gas in the soft tissues consistent with recent surgery. IMPRESSION: Interval postsurgical changes of the right femur for comminuted intertrochanteric fracture. Electronically Signed   By: Donavan Foil M.D.   On: 09/27/2021 15:42   DG Hip Unilat W or Wo Pelvis 2-3 Views Right  Result Date: 09/26/2021 CLINICAL DATA:  Fall this morning. Right hip pain. Initial encounter. EXAM: DG HIP (WITH OR WITHOUT PELVIS) 2-3V RIGHT COMPARISON:  None. FINDINGS: A comminuted displaced intratrochanteric fracture the right hip is seen with impaction and medial angulation of the distal fracture fragment. No evidence of dislocation. No pelvic fracture or bone lesions identified. IMPRESSION: Comminuted, displaced  intratrochanteric right hip fracture. Electronically Signed   By: Marlaine Hind M.D.   On: 09/26/2021 08:47   DG FEMUR, MIN 2 VIEWS RIGHT  Result Date: 09/27/2021 CLINICAL DATA:  Intramedullary nail fixation, right femur EXAM: RIGHT FEMUR 2 VIEWS COMPARISON:  Right hip radiographs 09/26/2021. FINDINGS: Five fluoroscopic images are obtained during the performance of the procedure and are provided for interpretation only. Images demonstrate intramedullary nail fixation of a right intertrochanteric hip fracture, with fracture fragments in near anatomic alignment. No new fracture is seen. Fluoroscopy time: 1 minute 21 seconds 10.35 mGy IMPRESSION: Expected intraoperative appearance of a right femur fracture fixation.  Electronically Signed   By: Merilyn Baba M.D.   On: 09/27/2021 16:00     Labs:   Basic Metabolic Panel: Recent Labs  Lab 09/24/21 1104 09/26/21 0742 09/26/21 1823 09/28/21 0518 09/29/21 0213 09/30/21 0209  NA 137 138  --  134* 138 136  K 3.9 3.5  --  4.3 3.8 4.6  CL 102 104  --  102 104 103  CO2 27 27  --  23 26 27   GLUCOSE 128* 160*  --  144* 104* 111*  BUN 9 12  --  21 19 17   CREATININE 1.01* 0.96  --  1.28* 1.07* 0.83  CALCIUM 8.9 8.8*  --  8.3* 8.5* 8.6*  MG  --   --  2.0  --   --   --   PHOS  --   --  5.1*  --   --   --    GFR Estimated Creatinine Clearance: 47 mL/min (by C-G formula based on SCr of 0.83 mg/dL). Liver Function Tests: Recent Labs  Lab 09/24/21 1104  AST 21  ALT 12  ALKPHOS 57  BILITOT 0.8  PROT 6.3*  ALBUMIN 3.3*   No results for input(s): LIPASE, AMYLASE in the last 168 hours. No results for input(s): AMMONIA in the last 168 hours. Coagulation profile No results for input(s): INR, PROTIME in the last 168 hours.  CBC: Recent Labs  Lab 09/24/21 1104 09/26/21 0742 09/28/21 0518 09/29/21 0213 09/30/21 0209  WBC 9.2 11.5* 9.5 8.7 8.7  NEUTROABS 6.6 9.3* 8.1* 6.1 5.0  HGB 12.9 12.6 9.5* 9.2* 8.8*  HCT 41.4 40.1 29.8* 28.5* 27.9*   MCV 91.6 90.9 91.4 90.2 90.9  PLT 204 212 160 190 187   Cardiac Enzymes: Recent Labs  Lab 09/24/21 1104  CKTOTAL 213   BNP: Invalid input(s): POCBNP CBG: Recent Labs  Lab 09/29/21 0634 09/29/21 1159 09/29/21 1538 09/29/21 2029 09/30/21 0656  GLUCAP 98 93 107* 101* 92   D-Dimer No results for input(s): DDIMER in the last 72 hours. Hgb A1c No results for input(s): HGBA1C in the last 72 hours. Lipid Profile No results for input(s): CHOL, HDL, LDLCALC, TRIG, CHOLHDL, LDLDIRECT in the last 72 hours. Thyroid function studies No results for input(s): TSH, T4TOTAL, T3FREE, THYROIDAB in the last 72 hours.  Invalid input(s): FREET3 Anemia work up No results for input(s): VITAMINB12, FOLATE, FERRITIN, TIBC, IRON, RETICCTPCT in the last 72 hours. Microbiology Recent Results (from the past 240 hour(s))  Urine Culture     Status: Abnormal   Collection Time: 09/24/21  4:18 PM   Specimen: Urine, Clean Catch  Result Value Ref Range Status   Specimen Description URINE, CLEAN CATCH  Final   Special Requests   Final    NONE Performed at [facility], [address] [place], [place] [address]    Culture >=100,000 COLONIES/mL KLEBSIELLA PNEUMONIAE (A)  Final   Report Status 09/26/2021 FINAL  Final   Organism ID, Bacteria KLEBSIELLA PNEUMONIAE (A)  Final      Susceptibility   Klebsiella pneumoniae - MIC*    AMPICILLIN RESISTANT Resistant     CEFAZOLIN <=4 SENSITIVE Sensitive     CEFEPIME <=0.12 SENSITIVE Sensitive     CEFTRIAXONE <=0.25 SENSITIVE Sensitive     CIPROFLOXACIN <=0.25 SENSITIVE Sensitive     GENTAMICIN <=1 SENSITIVE Sensitive     IMIPENEM <=0.25 SENSITIVE Sensitive     NITROFURANTOIN 32 SENSITIVE Sensitive     TRIMETH/SULFA <=20 SENSITIVE Sensitive  AMPICILLIN/SULBACTAM <=2 SENSITIVE Sensitive     PIP/TAZO <=4 SENSITIVE Sensitive     * >=100,000 COLONIES/mL KLEBSIELLA PNEUMONIAE  Resp Panel by RT-PCR (Flu A&B, Covid) Nasopharyngeal Swab     Status: None    Collection Time: 09/26/21 10:32 AM   Specimen: Nasopharyngeal Swab; Nasopharyngeal(NP) swabs in vial transport medium  Result Value Ref Range Status   SARS Coronavirus 2 by RT PCR NEGATIVE NEGATIVE Final    Comment: (NOTE) SARS-CoV-2 target nucleic acids are NOT DETECTED.  The SARS-CoV-2 RNA is generally detectable in upper respiratory specimens during the acute phase of infection. The lowest concentration of SARS-CoV-2 viral copies this assay can detect is 138 copies/mL. A negative result does not preclude SARS-Cov-2 infection and should not be used as the sole basis for treatment or other patient management decisions. A negative result may occur with  improper specimen collection/handling, submission of specimen other than nasopharyngeal swab, presence of viral mutation(s) within the areas targeted by this assay, and inadequate number of viral copies(<138 copies/mL). A negative result must be combined with clinical observations, patient history, and epidemiological information. The expected result is Negative.  Fact Sheet for Patients:  EntrepreneurPulse.com.au  Fact Sheet for Healthcare Providers:  IncredibleEmployment.be  This test is no t yet approved or cleared by the [place] FDA and  has been authorized for detection and/or diagnosis of SARS-CoV-2 by FDA under an Emergency Use Authorization (EUA). This EUA will remain  in effect (meaning this test can be used) for the duration of the COVID-19 declaration under Section 564(b)(1) of the Act, 21 U.S.C.section 360bbb-3(b)(1), unless the authorization is terminated  or revoked sooner.       Influenza A by PCR NEGATIVE NEGATIVE Final   Influenza B by PCR NEGATIVE NEGATIVE Final    Comment: (NOTE) The Xpert Xpress SARS-CoV-2/FLU/RSV plus assay is intended as an aid in the diagnosis of influenza from Nasopharyngeal swab specimens and should not be used as a sole basis for treatment.  Nasal washings and aspirates are unacceptable for Xpert Xpress SARS-CoV-2/FLU/RSV testing.  Fact Sheet for Patients: EntrepreneurPulse.com.au  Fact Sheet for Healthcare Providers: IncredibleEmployment.be  This test is not yet approved or cleared by the [place] FDA and has been authorized for detection and/or diagnosis of SARS-CoV-2 by FDA under an Emergency Use Authorization (EUA). This EUA will remain in effect (meaning this test can be used) for the duration of the COVID-19 declaration under Section 564(b)(1) of the Act, 21 U.S.C. section 360bbb-3(b)(1), unless the authorization is terminated or revoked.  Performed at [facility], [place] [address], [place], [place] [address]   Surgical pcr screen     Status: None   Collection Time: 09/26/21 10:44 PM   Specimen: Nasal Mucosa; Nasal Swab  Result Value Ref Range Status   MRSA, PCR NEGATIVE NEGATIVE Final   Staphylococcus aureus NEGATIVE NEGATIVE Final    Comment: (NOTE) The Xpert SA Assay (FDA approved for NASAL specimens in patients 18 years of age and older), is one component of a comprehensive surveillance program. It is not intended to diagnose infection nor to guide or monitor treatment. Performed at [facility], [place] [address], [place], [place] [address]      Signed: Terrilee Croak  [facility] 09/30/2021, 9:46 AM

## 2021-09-30 NOTE — Plan of Care (Addendum)
COVID test completed 0845  Problem: Education: Goal: Verbalization of understanding the information provided (i.e., activity precautions, restrictions, etc) will improve Outcome: Progressing   Problem: Activity: Goal: Ability to ambulate and perform ADLs will improve Outcome: Progressing   Problem: Pain Management: Goal: Pain level will decrease Outcome: Progressing   Problem: Education: Goal: Knowledge of General Education information will improve Description: Including pain rating scale, medication(s)/side effects and non-pharmacologic comfort measures Outcome: Progressing   Problem: Activity: Goal: Risk for activity intolerance will decrease Outcome: Progressing   Problem: Pain Managment: Goal: General experience of comfort will improve Outcome: Progressing   Problem: Safety: Goal: Ability to remain free from injury will improve Outcome: Progressing   Problem: Skin Integrity: Goal: Risk for impaired skin integrity will decrease Outcome: Progressing

## 2021-09-30 NOTE — Plan of Care (Signed)
Problem: Education: Goal: Verbalization of understanding the information provided (i.e., activity precautions, restrictions, etc) will improve 09/30/2021 1448 by Darleene Cleaver, RN Outcome: Adequate for Discharge 09/30/2021 0843 by Darleene Cleaver, RN Outcome: Progressing Goal: Individualized Educational Video(s) Outcome: Adequate for Discharge   Problem: Activity: Goal: Ability to ambulate and perform ADLs will improve 09/30/2021 1448 by Darleene Cleaver, RN Outcome: Adequate for Discharge 09/30/2021 [phone] by Darleene Cleaver, RN Outcome: Progressing   Problem: Clinical Measurements: Goal: Postoperative complications will be avoided or minimized Outcome: Adequate for Discharge   Problem: Self-Concept: Goal: Ability to maintain and perform role responsibilities to the fullest extent possible will improve Outcome: Adequate for Discharge   Problem: Pain Management: Goal: Pain level will decrease 09/30/2021 1448 by Darleene Cleaver, RN Outcome: Adequate for Discharge 09/30/2021 0843 by Darleene Cleaver, RN Outcome: Progressing   Problem: Education: Goal: Knowledge of General Education information will improve Description: Including pain rating scale, medication(s)/side effects and non-pharmacologic comfort measures 09/30/2021 1448 by Darleene Cleaver, RN Outcome: Adequate for Discharge 09/30/2021 0843 by Darleene Cleaver, RN Outcome: Progressing   Problem: Health Behavior/Discharge Planning: Goal: Ability to manage health-related needs will improve Outcome: Adequate for Discharge   Problem: Clinical Measurements: Goal: Ability to maintain clinical measurements within normal limits will improve Outcome: Adequate for Discharge Goal: Will remain free from infection Outcome: Adequate for Discharge Goal: Diagnostic test results will improve Outcome: Adequate for Discharge Goal: Respiratory complications will improve Outcome: Adequate for Discharge Goal: Cardiovascular  complication will be avoided Outcome: Adequate for Discharge   Problem: Activity: Goal: Risk for activity intolerance will decrease 09/30/2021 1448 by Darleene Cleaver, RN Outcome: Adequate for Discharge 09/30/2021 [phone] by Darleene Cleaver, RN Outcome: Progressing   Problem: Nutrition: Goal: Adequate nutrition will be maintained Outcome: Adequate for Discharge   Problem: Coping: Goal: Level of anxiety will decrease Outcome: Adequate for Discharge   Problem: Elimination: Goal: Will not experience complications related to bowel motility Outcome: Adequate for Discharge Goal: Will not experience complications related to urinary retention Outcome: Adequate for Discharge   Problem: Pain Managment: Goal: General experience of comfort will improve 09/30/2021 1448 by Darleene Cleaver, RN Outcome: Adequate for Discharge 09/30/2021 0843 by Darleene Cleaver, RN Outcome: Progressing   Problem: Safety: Goal: Ability to remain free from injury will improve 09/30/2021 1448 by Darleene Cleaver, RN Outcome: Adequate for Discharge 09/30/2021 0843 by Darleene Cleaver, RN Outcome: Progressing   Problem: Skin Integrity: Goal: Risk for impaired skin integrity will decrease 09/30/2021 1448 by Darleene Cleaver, RN Outcome: Adequate for Discharge 09/30/2021 0843 by Darleene Cleaver, RN Outcome: Progressing   Problem: Increased Nutrient Needs (NI-5.1) Goal: Food and/or nutrient delivery Description: Individualized approach for food/nutrient provision. Outcome: Adequate for Discharge   Problem: Acute Rehab OT Goals (only OT should resolve) Goal: Pt. Will Perform Grooming Outcome: Adequate for Discharge Goal: Pt. Will Perform Lower Body Bathing Outcome: Adequate for Discharge Goal: Pt. Will Perform Lower Body Dressing Outcome: Adequate for Discharge Goal: Pt. Will Transfer To Toilet Outcome: Adequate for Discharge Goal: Pt. Will Perform Toileting-Clothing Manipulation Outcome: Adequate  for Discharge   Problem: Acute Rehab PT Goals(only PT should resolve) Goal: Pt Will Go Supine/Side To Sit Outcome: Adequate for Discharge Goal: Patient Will Transfer Sit To/From Stand Outcome: Adequate for Discharge Goal: Pt Will Transfer Bed To Chair/Chair To Bed Outcome: Adequate for Discharge Goal: Pt Will Perform Standing Balance Or Pre-Gait Outcome: Adequate for Discharge Goal: Pt Will  Ambulate Outcome: Adequate for Discharge

## 2021-09-30 NOTE — Progress Notes (Signed)
Patient D/C to [facility] via [facility] with her belongings just now, all questions were answered, denied pain or discomfort.

## 2021-09-30 NOTE — Progress Notes (Signed)
Waiting for [facility] transport to [facility]. Patient at her present baseline stable

## 2021-09-30 NOTE — TOC Transition Note (Signed)
Transition of Care [facility]) - CM/SW Discharge Note   Patient Details  Name: [name] MRN: [id number] Date of Birth: 1932-11-02  [facility]) CM/SW Contact:  Ralene Bathe, LCSWA Phone Number: 09/30/2021, 12:11 PM   Clinical Narrative:    Patient will DC to: SNF Anticipated DC date: 09/30/2021 Family notified: Yes Transport by: Sharin Mons   Per MD patient ready for DC to SNF . RN to call report prior to discharge [phone] room 609P. RN, patient, patient's family, and facility notified of DC. Discharge Summary and FL2 sent to facility. DC packet on chart. Ambulance transport requested for patient.   CSW will sign off for now as social work intervention is no longer needed. Please consult [place] again if new needs arise.       Barriers to Discharge: Continued Medical Work up   Patient Goals and CMS Choice        Discharge Placement                       Discharge Plan and Services   Discharge Planning Services: CM Consult                                 Social Determinants of Health (SDOH) Interventions     Readmission Risk Interventions No flowsheet data found.

## 2021-09-30 NOTE — Progress Notes (Signed)
Physical Therapy Treatment Patient Details Name: [name] MRN: [id number] DOB: 24-Jul-1932 Today's Date: 09/30/2021   History of Present Illness Pt is 87 y/o F admitted 11/6 s/[name] fall at SNF and found to have R hip fx and right thumb distal phalanx fracture. S/[name] R femur IM nailing 09/27/21. PMH includes CKD, Alzheimer's, HLD, HTN, osteoporosis, DM, OA. falls    PT Comments    Pt drowsy on arrival but able to awaken to voice and stimulation. Pt not oriented and requires max multimodal cues for all movement and transfers. Pt with increased standing tolerance today with use of stedy. Pt with highly varied pain initially stating pain in right calf then in standing stating right hip and in stedy stated right foot. [name]. Will continue to work toward increased functional mobility and strength.     Recommendations for follow up therapy are one component of a multi-disciplinary discharge planning process, led by the attending physician.  Recommendations may be updated based on patient status, additional functional criteria and insurance authorization.  Follow Up Recommendations  Skilled nursing-short term rehab (<3 hours/day)     Assistance Recommended at Discharge Frequent or constant Supervision/Assistance  Equipment Recommendations  Wheelchair (measurements PT);Hospital bed;Wheelchair cushion (measurements PT)    Recommendations for Other Services       Precautions / Restrictions Precautions Precautions: Fall;Other (comment)     Mobility  Bed Mobility Overal bed mobility: Needs Assistance Bed Mobility: Supine to Sit     Supine to sit: Max assist;+2 for physical assistance     General bed mobility comments: Max +2 assist to pivot to right side of bed with pad, pt able to bend left knee but not assisting with briding or scooting. she did reach for rail when cued but unable to significantly assist    Transfers Overall transfer  level: Needs assistance   Transfers: Sit to/from Stand;Bed to chair/wheelchair/BSC Sit to Stand: Mod assist;+2 physical assistance           General transfer comment: physical assist with belt and pad to rise from surface. Attempted to perform stand pivot but pt unable to tolerate rotation of Right hip to pivot and return to EOB. PT then stood with [facility] for pivot to chair. Min +2 assist to rise from stedy pads. Transfer via Lift Equipment: Stedy  Ambulation/Gait               General Gait Details: unable to tolerate   Stairs             Wheelchair Mobility    Modified Rankin (Stroke Patients Only)       Balance Overall balance assessment: Needs assistance   Sitting balance-Leahy Scale: Fair Sitting balance - Comments: EOB with guarding   Standing balance support: Bilateral upper extremity supported;Reliant on assistive device for balance Standing balance-Leahy Scale: Poor Standing balance comment: bil UE support and knees blocked for standing                            Cognition Arousal/Alertness: Awake/alert Behavior During Therapy: Agitated Overall Cognitive Status: No family/caregiver present to determine baseline cognitive functioning Area of Impairment: Orientation;Attention;Memory;Following commands;Safety/judgement;Problem solving                 Orientation Level: Disoriented to;Place;Time;Situation;Person Current Attention Level: Focused Memory: Decreased short-term memory Following Commands: Follows one step commands inconsistently;Follows one step commands with increased time Safety/Judgement: Decreased awareness  of safety;Decreased awareness of deficits   Problem Solving: Slow processing;Decreased initiation;Difficulty sequencing;Requires tactile cues;Requires verbal cues General Comments: Pt not oriented and noted her pain kept moving in her right leg. No demonstration of awareness of deficits or need to get stronger         Exercises General Exercises - Lower Extremity Long Arc Quad: AROM;Seated;10 reps;AAROM;Right;Left (AAROM on RLE)    General Comments        Pertinent Vitals/Pain Pain Assessment: PAINAD Breathing: normal Negative Vocalization: repeated troubled calling out, loud moaning/groaning, crying Facial Expression: facial grimacing Body Language: relaxed Consolability: distracted or reassured by voice/touch PAINAD Score: 5    Home Living                          Prior Function            PT Goals (current goals can now be found in the care plan section) Progress towards PT goals: Progressing toward goals    Frequency    Min 3X/week      PT Plan Current plan remains appropriate    Co-evaluation              AM-PAC PT "6 Clicks" Mobility   Outcome Measure  Help needed turning from your back to your side while in a flat bed without using bedrails?: Total Help needed moving from lying on your back to sitting on the side of a flat bed without using bedrails?: Total Help needed moving to and from a bed to a chair (including a wheelchair)?: Total Help needed standing up from a chair using your arms (e.g., wheelchair or bedside chair)?: Total Help needed to walk in hospital room?: Total Help needed climbing 3-5 steps with a railing? : Total 6 Click Score: 6    End of Session Equipment Utilized During Treatment: Gait belt Activity Tolerance: Patient limited by pain Patient left: in chair;with chair alarm set;with call bell/phone within reach Nurse Communication: Mobility status PT Visit Diagnosis: Muscle weakness (generalized) (M62.81);Pain;Difficulty in walking, not elsewhere classified (R26.2);Other abnormalities of gait and mobility (R26.89)     Time: 1133-1202 PT Time Calculation (min) (ACUTE ONLY): 29 min  Charges:  $Therapeutic Activity: 23-37 mins                     [name], PT Acute Rehabilitation Services Pager: [phone] Office:  [phone]    [name] 09/30/2021, 1:30 PM

## 2021-09-30 NOTE — TOC Progression Note (Signed)
Transition of Care [facility]) - Initial/Assessment Note    Patient Details  Name: [name] MRN: [id number] Date of Birth: 28-Jun-1932  [facility] Care [facility]) CM/SW Contact:    Ralene Bathe, LCSWA Phone Number: 09/30/2021, 8:32 AM  Clinical Narrative:                 08:00-  CSW spoke with the patient's daughter, [name] Pete, the family has accepted the bed offer at [facility].  CSW contacted [facility] and left a message informing the facility that the patient's family has accepted the bed offer. CSW is awaiting a response.  CSW informed the care team.  Expected Discharge Plan: Assisted Living (vs SNF) Barriers to Discharge: Continued Medical Work up   Patient Goals and CMS Choice        Expected Discharge Plan and Services Expected Discharge Plan: Assisted Living (vs SNF)   Discharge Planning Services: CM Consult   Living arrangements for the past 2 months: Assisted Living Facility                                      Prior Living Arrangements/Services Living arrangements for the past 2 months: Assisted Living Facility Lives with:: Self Patient language and need for interpreter reviewed:: Yes Do you feel safe going back to the place where you live?: Yes      Need for Family Participation in Patient Care: Yes (Comment) Care giver support system in place?: Yes (comment)   Criminal Activity/Legal Involvement Pertinent to Current Situation/Hospitalization: No - Comment as needed  Activities of Daily Living   ADL Screening (condition at time of admission) Patient's cognitive ability adequate to safely complete daily activities?: No Is the patient deaf or have difficulty hearing?: No Does the patient have difficulty seeing, even when wearing glasses/contacts?: No Does the patient have difficulty concentrating, remembering, or making decisions?: Yes Patient able to express need for assistance with ADLs?: No Does the patient have difficulty dressing  or bathing?: Yes Independently performs ADLs?: No Does the patient have difficulty walking or climbing stairs?: Yes Weakness of Legs: Right Weakness of Arms/Hands: Right  Permission Sought/Granted   Permission granted to share information with : Yes, Verbal Permission Granted  Share Information with NAME: Christ Kick (Daughter) [phone]           Emotional Assessment Appearance:: Appears stated age Attitude/Demeanor/Rapport: Gracious Affect (typically observed): Accepting Orientation: : Oriented to Self, Oriented to Place, Oriented to  Time, Oriented to Situation Alcohol / Substance Use: Not Applicable Psych Involvement: No (comment)  Admission diagnosis:  Fall [W19.XXXA] Preop cardiovascular exam [Z01.810] Closed right hip fracture, initial encounter [facility]) [S72.001A] Patient Active Problem List   Diagnosis Date Noted   Closed right hip fracture, initial encounter (HCC) 09/26/2021   Head trauma 09/26/2021   Hypocalcemia 09/26/2021   Hyperglycemia 09/26/2021   Acute metabolic encephalopathy 08/10/2021   COVID-19 08/09/2021   History of syncope 08/13/2019   Syncope and collapse 01/23/2019   Leukocytosis 01/23/2019   Diarrhea 01/23/2019   Prolonged QT interval 01/23/2019   Mild cognitive impairment 07/15/2015   Acute confusional state 06/19/2015   Memory loss 06/19/2015   DM II (diabetes mellitus, type II), controlled (HCC) 05/14/2015   Hyperlipidemia 05/14/2015   Essential hypertension, benign    TIA (transient ischemic attack) 05/13/2015   Anemia, iron deficiency 12/12/2011   Humerus fracture 12/11/2011   Hypoxia 12/11/2011  Wide-complex tachycardia 12/11/2011   Acute hypotension 12/11/2011   PCP:  Lupita Raider, MD Pharmacy:   [id number] - [place], [place] - [address] AT [facility] [address] [place] [address] Phone: [phone] Fax: [phone]  [id number] - [place], [place] -  [address] AT [facility] [address] [place] [address] Phone: [phone] Fax: [phone]     Social Determinants of Health (SDOH) Interventions    Readmission Risk Interventions No flowsheet data found.

## 2021-11-08 ENCOUNTER — Encounter (HOSPITAL_COMMUNITY): Payer: Self-pay

## 2021-11-08 ENCOUNTER — Emergency Department (HOSPITAL_COMMUNITY): Payer: Medicare Other

## 2021-11-08 ENCOUNTER — Other Ambulatory Visit: Payer: Self-pay

## 2021-11-08 ENCOUNTER — Emergency Department (HOSPITAL_COMMUNITY)
Admission: EM | Admit: 2021-11-08 | Discharge: 2021-11-09 | Disposition: A | Discharge status: Home / Self Care | Payer: Medicare Other | Attending: Emergency Medicine | Admitting: Emergency Medicine

## 2021-11-08 DIAGNOSIS — Z91040 Latex allergy status: Secondary | ICD-10-CM | POA: Insufficient documentation

## 2021-11-08 DIAGNOSIS — S0003XA Contusion of scalp, initial encounter: Secondary | ICD-10-CM | POA: Diagnosis not present

## 2021-11-08 DIAGNOSIS — Z8616 Personal history of COVID-19: Secondary | ICD-10-CM | POA: Diagnosis not present

## 2021-11-08 DIAGNOSIS — Z20822 Contact with and (suspected) exposure to covid-19: Secondary | ICD-10-CM | POA: Insufficient documentation

## 2021-11-08 DIAGNOSIS — D631 Anemia in chronic kidney disease: Secondary | ICD-10-CM | POA: Diagnosis not present

## 2021-11-08 DIAGNOSIS — Z79899 Other long term (current) drug therapy: Secondary | ICD-10-CM | POA: Insufficient documentation

## 2021-11-08 DIAGNOSIS — N390 Urinary tract infection, site not specified: Secondary | ICD-10-CM

## 2021-11-08 DIAGNOSIS — S22030A Wedge compression fracture of third thoracic vertebra, initial encounter for closed fracture: Secondary | ICD-10-CM

## 2021-11-08 DIAGNOSIS — M25551 Pain in right hip: Secondary | ICD-10-CM | POA: Insufficient documentation

## 2021-11-08 DIAGNOSIS — Z7901 Long term (current) use of anticoagulants: Secondary | ICD-10-CM | POA: Insufficient documentation

## 2021-11-08 DIAGNOSIS — E1122 Type 2 diabetes mellitus with diabetic chronic kidney disease: Secondary | ICD-10-CM | POA: Diagnosis not present

## 2021-11-08 DIAGNOSIS — Y92129 Unspecified place in nursing home as the place of occurrence of the external cause: Secondary | ICD-10-CM | POA: Diagnosis not present

## 2021-11-08 DIAGNOSIS — I129 Hypertensive chronic kidney disease with stage 1 through stage 4 chronic kidney disease, or unspecified chronic kidney disease: Secondary | ICD-10-CM | POA: Diagnosis not present

## 2021-11-08 DIAGNOSIS — W1830XA Fall on same level, unspecified, initial encounter: Secondary | ICD-10-CM | POA: Diagnosis not present

## 2021-11-08 DIAGNOSIS — N183 Chronic kidney disease, stage 3 unspecified: Secondary | ICD-10-CM | POA: Insufficient documentation

## 2021-11-08 DIAGNOSIS — W19XXXA Unspecified fall, initial encounter: Secondary | ICD-10-CM

## 2021-11-08 DIAGNOSIS — Z87891 Personal history of nicotine dependence: Secondary | ICD-10-CM | POA: Insufficient documentation

## 2021-11-08 DIAGNOSIS — Z7982 Long term (current) use of aspirin: Secondary | ICD-10-CM | POA: Diagnosis not present

## 2021-11-08 DIAGNOSIS — S0990XA Unspecified injury of head, initial encounter: Secondary | ICD-10-CM | POA: Diagnosis present

## 2021-11-08 DIAGNOSIS — G309 Alzheimer's disease, unspecified: Secondary | ICD-10-CM | POA: Insufficient documentation

## 2021-11-08 LAB — URINALYSIS, ROUTINE W REFLEX MICROSCOPIC
Bilirubin Urine: NEGATIVE
Glucose, UA: NEGATIVE mg/dL
Ketones, ur: NEGATIVE mg/dL
Nitrite: POSITIVE — AB
Protein, ur: NEGATIVE mg/dL
Specific Gravity, Urine: 1.015 (ref 1.005–1.030)
pH: 6 (ref 5.0–8.0)

## 2021-11-08 LAB — URINALYSIS, MICROSCOPIC (REFLEX)

## 2021-11-08 LAB — COMPREHENSIVE METABOLIC PANEL
ALT: 11 U/L (ref 0–44)
AST: 18 U/L (ref 15–41)
Albumin: 3.3 g/dL — ABNORMAL LOW (ref 3.5–5.0)
Alkaline Phosphatase: 117 U/L (ref 38–126)
Anion gap: 9 (ref 5–15)
BUN: 12 mg/dL (ref 8–23)
CO2: 26 mmol/L (ref 22–32)
Calcium: 9 mg/dL (ref 8.9–10.3)
Chloride: 105 mmol/L (ref 98–111)
Creatinine, Ser: 1.05 mg/dL — ABNORMAL HIGH (ref 0.44–1.00)
GFR, Estimated: 51 mL/min — ABNORMAL LOW (ref >60)
Glucose, Bld: 101 mg/dL — ABNORMAL HIGH (ref 70–99)
Potassium: 3.4 mmol/L — ABNORMAL LOW (ref 3.5–5.1)
Sodium: 140 mmol/L (ref 135–145)
Total Bilirubin: 0.5 mg/dL (ref 0.3–1.2)
Total Protein: 5.9 g/dL — ABNORMAL LOW (ref 6.5–8.1)

## 2021-11-08 LAB — CBC
HCT: 38.2 % (ref 36.0–46.0)
Hemoglobin: 11.7 g/dL — ABNORMAL LOW (ref 12.0–15.0)
MCH: 28.5 pg (ref 26.0–34.0)
MCHC: 30.6 g/dL (ref 30.0–36.0)
MCV: 93.2 fL (ref 80.0–100.0)
Platelets: 249 10*3/uL (ref 150–400)
RBC: 4.1 MIL/uL (ref 3.87–5.11)
RDW: 14.5 % (ref 11.5–15.5)
WBC: 9.6 10*3/uL (ref 4.0–10.5)
nRBC: 0 % (ref 0.0–0.2)

## 2021-11-08 LAB — PROTIME-INR
INR: 1.4 — ABNORMAL HIGH (ref 0.8–1.2)
Prothrombin Time: 17 seconds — ABNORMAL HIGH (ref 11.4–15.2)

## 2021-11-08 LAB — RESP PANEL BY RT-PCR (FLU A&B, COVID) ARPGX2
Influenza A by PCR: NEGATIVE
Influenza B by PCR: NEGATIVE
SARS Coronavirus 2 by RT PCR: NEGATIVE

## 2021-11-08 LAB — MAGNESIUM: Magnesium: 1.6 mg/dL — ABNORMAL LOW (ref 1.7–2.4)

## 2021-11-08 MED ORDER — CEPHALEXIN 500 MG PO CAPS: 500.0000 mg | ORAL_CAPSULE | Freq: Three times a day (TID) | ORAL | 0 refills | Status: AC

## 2021-11-08 MED ORDER — CEPHALEXIN 250 MG PO CAPS
500.0000 mg | ORAL_CAPSULE | Freq: Once | ORAL | Status: AC
  Administered 2021-11-09: 01:00:00 500 mg via ORAL
  Filled 2021-11-08: qty 2

## 2021-11-08 NOTE — ED Provider Notes (Signed)
[facility] EMERGENCY DEPARTMENT Provider Note   CSN: [id number] Arrival date & time: 11/08/21  1226     History Chief Complaint  Patient presents with   [name] is a 87 y.o. female.  HPI Patient with history of dementia, recent right hip surgery, and daily Eliquis use, presents for a ground-level fall at her skilled nursing facility.  Fall occurred 1 hour prior to arrival.  She was attempting to ambulate without assistance.  When she fell, she struck her right hip as well as her head.  After fall, she reportedly complained of pain in the area of her right hip.  Per chart review, patient underwent a cephalomedullary nailing of right intertrochanteric femur on 11/7 by Dr. Doreatha Martin.  Currently, patient has no complaints.    Past Medical History:  Diagnosis Date   Allergies    Alzheimer disease ([place])    STABLE   Anemia, iron deficiency 12/12/2011   Arthritis    RF   CKD (chronic kidney disease)    STAGE 3   Diabetes mellitus    no mes, diet only    Diarrhea    RESOLVED   Hypercholesterolemia    Hyperlipemia    Hypertension    Major depression    OA (osteoarthritis)    HAND/LEFT KNEE PAIN MILD   Osteoporosis    Stroke [facility])    Syncope     Patient Active Problem List   Diagnosis Date Noted   Closed right hip fracture, initial encounter ([place]) 09/26/2021   Head trauma 09/26/2021   Hypocalcemia 09/26/2021   Hyperglycemia [id number]   Acute metabolic encephalopathy [id number]   COVID-19 08/09/2021   History of syncope 08/13/2019   Syncope and collapse 01/23/2019   Leukocytosis 01/23/2019   Diarrhea 01/23/2019   Prolonged QT interval 01/23/2019   Mild cognitive impairment 07/15/2015   Acute confusional state 06/19/2015   Memory loss 06/19/2015   DM II (diabetes mellitus, type II), controlled ([place]) 05/14/2015   Hyperlipidemia 05/14/2015   Essential hypertension, benign    TIA (transient ischemic attack) 05/13/2015   Anemia, iron  deficiency 12/12/2011   Humerus fracture 12/11/2011   Hypoxia 12/11/2011   Wide-complex tachycardia 12/11/2011   Acute hypotension 12/11/2011    Past Surgical History:  Procedure Laterality Date   INTRAMEDULLARY (IM) NAIL INTERTROCHANTERIC Right 09/27/2021   Procedure: INTRAMEDULLARY (IM) NAIL INTERTROCHANTRIC;  Surgeon: Shona Needles, MD;  Location: [facility];  Service: Orthopedics;  Laterality: Right;   left knee arthroscopy     ORIF HUMERUS FRACTURE  12/09/2011   Procedure: OPEN REDUCTION INTERNAL FIXATION (ORIF) DISTAL HUMERUS FRACTURE;  Surgeon: Linna Hoff, MD;  Location: [facility];  Service: Orthopedics;  Laterality: Right;   OTHER SURGICAL HISTORY     left small toe bone spur removed    TONSILLECTOMY       OB History   No obstetric history on file.     Family History  Problem Relation Age of Onset   Heart Problems Mother    Heart attack Maternal Grandmother        died from a "heart attack"   Stroke Neg Hx    Arrhythmia Neg Hx     Social History   Tobacco Use   Smoking status: Former    Types: Cigarettes    Quit date: 12/29/2009    Years since quitting: 11.8   Smokeless tobacco: Never  Vaping Use   Vaping Use: Never used  Substance Use Topics  Alcohol use: No    Alcohol/week: 0.0 standard drinks   Drug use: No    Home Medications Prior to Admission medications   Medication Sig Start Date End Date Taking? Authorizing Provider  acetaminophen (TYLENOL) 325 MG tablet Take 1 tablet (325 mg total) by mouth every 6 (six) hours as needed for moderate pain. Patient not taking: Reported on 09/26/2021 10/10/20   Rayna Sexton, PA-C  acetaminophen (TYLENOL) 500 MG tablet Take 500 mg by mouth every 8 (eight) hours as needed for mild pain.    [provider]  apixaban (ELIQUIS) 2.5 MG TABS tablet Take 1 tablet (2.5 mg total) by mouth 2 (two) times daily. 08/16/21   Little Ishikawa, MD  aspirin 325 MG tablet Take 325 mg by mouth daily.    [provider]  Calcium Carbonate-Vitamin D3 600-400 MG-UNIT TABS Take 2 tablets by mouth every morning.    [provider]  docusate sodium (COLACE) 100 MG capsule Take 1 capsule (100 mg total) by mouth every 12 (twelve) hours. Patient taking differently: Take 100 mg by mouth See admin instructions. 100mg  every 12 hours  AND 100mg  daily as needed for constipation 12/27/16   Ward, Cyril Mourning N, DO  feeding supplement (ENSURE ENLIVE / ENSURE PLUS) LIQD Take 237 mLs by mouth 2 (two) times daily between meals. 09/30/21   Terrilee Croak, MD  HYDROcodone-acetaminophen (NORCO/VICODIN) 5-325 MG tablet Take 1 tablet by mouth every 6 (six) hours as needed for severe pain. 09/29/21   Corinne Ports, PA-C  Melatonin 3 MG TABS Take 3 mg by mouth at bedtime.    [provider]  memantine (NAMENDA) 10 MG tablet Take 10 mg by mouth 2 (two) times daily.    [provider]  metoprolol tartrate (LOPRESSOR) 25 MG tablet Take 0.5 tablets (12.5 mg total) by mouth 2 (two) times daily. Patient taking differently: Take 25 mg by mouth 2 (two) times daily. 08/16/21   Little Ishikawa, MD  polyethylene glycol (MIRALAX / GLYCOLAX) 17 g packet Take 17 g by mouth daily as needed for mild constipation. 09/30/21   Dahal, Marlowe Aschoff, MD  senna-docusate (SENOKOT-S) 8.6-50 MG tablet Take 1 tablet by mouth 2 (two) times daily. 09/30/21   Terrilee Croak, MD  sertraline (ZOLOFT) 100 MG tablet Take 100 mg by mouth at bedtime.    [provider]  simvastatin (ZOCOR) 20 MG tablet Take 1 tablet (20 mg total) by mouth daily at 6 PM. 08/19/21   Little Ishikawa, MD  vitamin B-12 (CYANOCOBALAMIN) 1000 MCG tablet Take 1,000 mcg by mouth 2 (two) times daily.    [provider]  Vitamin D3 (VITAMIN D) 25 MCG tablet Take 1 tablet (1,000 Units total) by mouth daily. 09/29/21 12/28/21  Corinne Ports, PA-C    Allergies    Sulfa antibiotics and Latex  Review of Systems   Review of Systems   Constitutional:  Negative for chills and fever.  HENT:  Negative for congestion, ear pain and sore throat.   Eyes:  Negative for pain and visual disturbance.  Respiratory:  Negative for cough, chest tightness and shortness of breath.   Cardiovascular:  Negative for chest pain and palpitations.  Gastrointestinal:  Negative for abdominal pain, nausea and vomiting.  Genitourinary:  Negative for flank pain and pelvic pain.  Musculoskeletal:  Negative for arthralgias, back pain, joint swelling, myalgias, neck pain and neck stiffness.  Skin:  Negative for wound.  Neurological:  Negative for dizziness, seizures, syncope, speech difficulty,  weakness, light-headedness, numbness and headaches.  Hematological:  Bruises/bleeds easily (On Eliquis).  All other systems reviewed and are negative.  Physical Exam Updated Vital Signs BP (!) 126/45    Pulse 65    Temp 98.1 F (36.7 C) (Oral)    Resp 14    Ht 5\' 2"  (1.575 m)    Wt 68 kg    SpO2 94%    BMI 27.44 kg/m   Physical Exam Vitals and nursing note reviewed.  Constitutional:      General: She is not in acute distress.    Appearance: Normal appearance. She is well-developed. She is not ill-appearing, toxic-appearing or diaphoretic.  HENT:     Head: Normocephalic.     Comments: Small right parietal hematoma    Right Ear: External ear normal.     Left Ear: External ear normal.     Nose: Nose normal.     Mouth/Throat:     Mouth: Mucous membranes are moist.     Pharynx: Oropharynx is clear.  Eyes:     General: No scleral icterus.    Extraocular Movements: Extraocular movements intact.     Conjunctiva/sclera: Conjunctivae normal.  Cardiovascular:     Rate and Rhythm: Normal rate and regular rhythm.     Heart sounds: No murmur heard. Pulmonary:     Effort: Pulmonary effort is normal. No respiratory distress.     Breath sounds: Normal breath sounds. No wheezing or rales.  Chest:     Chest wall: No tenderness.  Abdominal:     Palpations:  Abdomen is soft.     Tenderness: There is no abdominal tenderness.  Musculoskeletal:        General: No swelling, tenderness or deformity.     Cervical back: Normal range of motion and neck supple. No rigidity.     Right lower leg: No edema.     Left lower leg: No edema.  Skin:    General: Skin is warm and dry.     Capillary Refill: Capillary refill takes less than 2 seconds.     Coloration: Skin is not jaundiced or pale.  Neurological:     General: No focal deficit present.     Mental Status: She is alert. Mental status is at baseline.     Cranial Nerves: No cranial nerve deficit.     Sensory: No sensory deficit.     Motor: No weakness.     Coordination: Coordination normal.  Psychiatric:        Mood and Affect: Mood normal.        Behavior: Behavior normal.        Thought Content: Thought content normal.        Judgment: Judgment normal.   ED Results / Procedures / Treatments   Labs (all labs ordered are listed, but only abnormal results are displayed) Labs Reviewed  COMPREHENSIVE METABOLIC PANEL - Abnormal; Notable for the following components:      Result Value   Potassium 3.4 (*)    Glucose, Bld 101 (*)    Creatinine, Ser 1.05 (*)    Total Protein 5.9 (*)    Albumin 3.3 (*)    GFR, Estimated 51 (*)    All other components within normal limits  CBC - Abnormal; Notable for the following components:   Hemoglobin 11.7 (*)    All other components within normal limits  PROTIME-INR - Abnormal; Notable for the following components:   Prothrombin Time 17.0 (*)    INR 1.4 (*)  All other components within normal limits  MAGNESIUM - Abnormal; Notable for the following components:   Magnesium 1.6 (*)    All other components within normal limits  RESP PANEL BY RT-PCR (FLU A&B, COVID) ARPGX2  URINALYSIS, ROUTINE W REFLEX MICROSCOPIC    EKG None  Radiology DG Chest 1 View  Result Date: 11/08/2021 CLINICAL DATA:  Witnessed fall. EXAM: CHEST  1 VIEW COMPARISON:   September 26, 2021. FINDINGS: The heart size and mediastinal contours are within normal limits. Both lungs are clear. The visualized skeletal structures are unremarkable. IMPRESSION: No active disease. Electronically Signed   By: Lupita Raider M.D.   On: 11/08/2021 13:22   CT HEAD WO CONTRAST  Result Date: 11/08/2021 CLINICAL DATA:  Head trauma.  Fall this morning. EXAM: CT HEAD WITHOUT CONTRAST TECHNIQUE: Contiguous axial images were obtained from the base of the skull through the vertex without intravenous contrast. COMPARISON:  CT head dated September 26, 2021. FINDINGS: Brain: No evidence of acute infarction, hemorrhage, hydrocephalus, extra-axial collection or mass lesion/mass effect. Moderate cerebral atrophy. Stable left cerebellar encephalomalacia, likely secondary to prior infarct. Vascular: No hyperdense vessel or unexpected calcification. Skull: Right parietal scalp soft tissue swelling without evidence of calvarial fracture. Sinuses/Orbits: Mucosal thickening of the right maxillary sinus. The remaining paranasal sinuses are clear. Other: None. IMPRESSION: 1.  No acute intracranial abnormality. 2. Right parietal soft tissue swelling without evidence of calvarial fracture. 3.  Mild paranasal sinus disease. 4. Moderate cerebral volume loss and probably chronic left cerebellar infarct, unchanged. Electronically Signed   By: Larose Hires D.O.   On: 11/08/2021 13:45   CT CERVICAL SPINE WO CONTRAST  Result Date: 11/08/2021 CLINICAL DATA:  Neck trauma (Age >= 65y), witnessed fall EXAM: CT CERVICAL SPINE WITHOUT CONTRAST TECHNIQUE: Multidetector CT imaging of the cervical spine was performed without intravenous contrast. Multiplanar CT image reconstructions were also generated. COMPARISON:  09/26/2021, 09/24/2021, 08/09/2021 FINDINGS: Alignment: Facet joints are aligned without dislocation or traumatic listhesis. Dens and lateral masses are aligned. Skull base and vertebrae: Mild superior endplate  compression fracture of T3 is new from prior chest CT of 08/09/2021 (series 7, image 25). Cervical vertebral bodies are intact without evidence of fracture. No suspicious lytic or sclerotic bone lesion. Soft tissues and spinal canal: No prevertebral fluid or swelling. No visible canal hematoma. Disc levels: Similar degree of degenerative disc disease most prevalent at the C5-6 and C6-7 levels. Bilateral facet and uncovertebral arthropathy. Upper chest: Included lung apices are clear. Other: Bilateral carotid atherosclerosis. IMPRESSION: 1. No acute fracture or traumatic listhesis of the cervical spine. 2. Acute-to-subacute mild superior endplate compression fracture of T3 is new from prior CT of 09/24/2021. 3. Multilevel cervical spondylosis. Electronically Signed   By: Duanne Guess D.O.   On: 11/08/2021 13:50   DG Hip Unilat W or Wo Pelvis 2-3 Views Right  Result Date: 11/08/2021 CLINICAL DATA:  Right hip pain after fall today. EXAM: DG HIP (WITH OR WITHOUT PELVIS) 2-3V RIGHT COMPARISON:  September 27, 2021. FINDINGS: Status post surgical internal fixation of proximal right femoral fracture. This is unchanged compared to prior exam. No new fracture or dislocation is noted. IMPRESSION: Postsurgical changes as described above. No acute abnormality is noted. Electronically Signed   By: Lupita Raider M.D.   On: 11/08/2021 13:21    Procedures Procedures   Medications Ordered in ED Medications - No data to display  ED Course  I have reviewed the triage vital signs and the nursing notes.  Pertinent labs & imaging results that were available during my care of the patient were reviewed by me and considered in my medical decision making (see chart for details).    MDM Rules/Calculators/A&P                         Patient presents after ground-level fall at skilled nursing facility.  Last month, she underwent repair of right hip fracture.  When she fell today, she also struck her right hip.  She was  sent to the ED due to concern of right hip injury, as well as head injury on blood thinners.  On arrival in the ED, patient denies any areas of pain.  Although she has documented dementia, patient does have recollection of events prior to arrival.  Given history, will obtain CT of head, cervical spine, as well as x-ray imaging of right hip.  Given absence of pain, no analgesia is indicated.  Imaging studies were notable for a acute/subacute T3 compression fracture that is new since 11/4.  Patient does not have tenderness to this area.  Imaging studies are otherwise negative for acute findings.  At time of signout, results of lab work were pending.  Care of patient was signed out to oncoming ED provider.  Final Clinical Impression(s) / ED Diagnoses Final diagnoses:  Fall    Rx / DC Orders ED Discharge Orders     None        Godfrey Pick, MD 11/08/21 1622

## 2021-11-08 NOTE — ED Provider Notes (Signed)
°  Provider Note MRN:  [id number]  Arrival date & time: 11/08/21    ED Course and Medical Decision Making  Assumed care from Dr. Durwin Nora at shift change.  UTI, new T3 fx, plan is dc back to facility with increased care.  Awaiting PTAR.  Procedures  Final Clinical Impressions(s) / ED Diagnoses     ICD-10-CM   1. Fall  [email or web address].Lorne Skeens DG Chest 1 View    DG Chest 1 View    CANCELED: DG Chest Port 1 View    CANCELED: DG Chest [facility] 1 View      ED Discharge Orders     None       Discharge Instructions   None     Elmer Sow. Pilar Plate, MD [facility] Emergency Medicine [facility] [email or web address]    Sabas Sous, MD 11/09/21 [phone]

## 2021-11-08 NOTE — ED Triage Notes (Signed)
Pt BIB [facility] from [facility] after a witnessed fall this morning. Pt had a right hip replacement 6 weeks ago and is now c/o right hip pain. Pt did not hit her head but is on eliquis.

## 2021-11-08 NOTE — Discharge Instructions (Addendum)
Patient will need increased level of care (assisted living or skilled nursing) from the facility. She currently has a UTI that will require antibiotics. She has a thoracic compression fracture that will require neurosurgery follow up.  For pain control you may take at 1000 mg of Tylenol every 8 hours as needed for moderate to severe pain.

## 2021-11-08 NOTE — ED Notes (Signed)
Patient transported to X-ray 

## 2021-11-09 ENCOUNTER — Encounter (HOSPITAL_COMMUNITY): Payer: Self-pay

## 2021-11-09 ENCOUNTER — Emergency Department (HOSPITAL_COMMUNITY): Payer: Medicare Other

## 2021-11-09 ENCOUNTER — Emergency Department (HOSPITAL_COMMUNITY)
Admission: EM | Admit: 2021-11-09 | Discharge: 2021-11-09 | Disposition: A | Discharge status: Home / Self Care | Payer: Medicare Other | Source: Home / Self Care | Attending: Emergency Medicine | Admitting: Emergency Medicine

## 2021-11-09 DIAGNOSIS — Z91040 Latex allergy status: Secondary | ICD-10-CM | POA: Insufficient documentation

## 2021-11-09 DIAGNOSIS — I129 Hypertensive chronic kidney disease with stage 1 through stage 4 chronic kidney disease, or unspecified chronic kidney disease: Secondary | ICD-10-CM | POA: Insufficient documentation

## 2021-11-09 DIAGNOSIS — S0990XA Unspecified injury of head, initial encounter: Secondary | ICD-10-CM | POA: Insufficient documentation

## 2021-11-09 DIAGNOSIS — W19XXXA Unspecified fall, initial encounter: Secondary | ICD-10-CM

## 2021-11-09 DIAGNOSIS — N183 Chronic kidney disease, stage 3 unspecified: Secondary | ICD-10-CM | POA: Insufficient documentation

## 2021-11-09 DIAGNOSIS — Z7901 Long term (current) use of anticoagulants: Secondary | ICD-10-CM | POA: Insufficient documentation

## 2021-11-09 DIAGNOSIS — Z8616 Personal history of COVID-19: Secondary | ICD-10-CM | POA: Insufficient documentation

## 2021-11-09 DIAGNOSIS — Z79899 Other long term (current) drug therapy: Secondary | ICD-10-CM | POA: Insufficient documentation

## 2021-11-09 DIAGNOSIS — Z7982 Long term (current) use of aspirin: Secondary | ICD-10-CM | POA: Insufficient documentation

## 2021-11-09 DIAGNOSIS — E1122 Type 2 diabetes mellitus with diabetic chronic kidney disease: Secondary | ICD-10-CM | POA: Insufficient documentation

## 2021-11-09 DIAGNOSIS — Z87891 Personal history of nicotine dependence: Secondary | ICD-10-CM | POA: Insufficient documentation

## 2021-11-09 DIAGNOSIS — G309 Alzheimer's disease, unspecified: Secondary | ICD-10-CM | POA: Insufficient documentation

## 2021-11-09 NOTE — ED Notes (Signed)
Trauma Response Nurse Note-  Reason for Call / Reason for Trauma activation:   -L2 fall on thinners  Initial Focused Assessment (If applicable, or please see trauma documentation):  -Patient A&Ox4, from [facility] -MAE, small scratch noted to left hip -Complains only of pain to back of head, which she says she hit during the fall, no LOC  Interventions:  - CT head/cspine  Plan of Care as of this note:  -If imaging negative, should d/c back to SNF

## 2021-11-09 NOTE — ED Triage Notes (Signed)
Pt BIB [facility] for eval of fall on thinners at facility. Unwittnessed fall, hematoma to back of head, on eliquis. Was seen yesterday for a fall on thinners. Here for same today.

## 2021-11-09 NOTE — ED Provider Notes (Signed)
[facility] EMERGENCY DEPARTMENT Provider Note   CSN: [id number] Arrival date & time: 11/09/21  1114     History Chief Complaint  Patient presents with   [name] is a 87 y.o. female.  HPI Patient presents after being discharged from this facility only about 4 hours ago.  She arrives now as a level 2 trauma due to a presumed fall while on Eliquis.  The patient has dementia, level 5 caveat. History is obtained by EMS providers and notes from recent hospitalization.  Patient was discharged earlier today after prior fall resulting in third thoracic spine fracture.  Seemingly the patient was found on the ground in the bathroom with hematoma right occiput by staff.  The patient herself denies pain, complaints, but as above has dementia.  No staff report of other obvious injuries, no EMS report of hemodynamic instability.  Per EMS report patient was awake, alert, hemodynamically unremarkable in transport.  On chart review is clear the patient was discharged within the past few hours after fall as above with third thoracic vertebrae fracture.    Past Medical History:  Diagnosis Date   Allergies    Alzheimer disease (HCC)    STABLE   Anemia, iron deficiency 12/12/2011   Arthritis    RF   CKD (chronic kidney disease)    STAGE 3   Diabetes mellitus    no mes, diet only    Diarrhea    RESOLVED   Hypercholesterolemia    Hyperlipemia    Hypertension    Major depression    OA (osteoarthritis)    HAND/LEFT KNEE PAIN MILD   Osteoporosis    Stroke [facility])    Syncope     Patient Active Problem List   Diagnosis Date Noted   Closed right hip fracture, initial encounter (HCC) 09/26/2021   Head trauma 09/26/2021   Hypocalcemia 09/26/2021   Hyperglycemia 09/26/2021   Acute metabolic encephalopathy 08/10/2021   COVID-19 08/09/2021   History of syncope 08/13/2019   Syncope and collapse 01/23/2019   Leukocytosis 01/23/2019   Diarrhea 01/23/2019    Prolonged QT interval 01/23/2019   Mild cognitive impairment 07/15/2015   Acute confusional state 06/19/2015   Memory loss 06/19/2015   DM II (diabetes mellitus, type II), controlled (HCC) 05/14/2015   Hyperlipidemia 05/14/2015   Essential hypertension, benign    TIA (transient ischemic attack) 05/13/2015   Anemia, iron deficiency 12/12/2011   Humerus fracture 12/11/2011   Hypoxia 12/11/2011   Wide-complex tachycardia 12/11/2011   Acute hypotension 12/11/2011    Past Surgical History:  Procedure Laterality Date   INTRAMEDULLARY (IM) NAIL INTERTROCHANTERIC Right 09/27/2021   Procedure: INTRAMEDULLARY (IM) NAIL INTERTROCHANTRIC;  Surgeon: Roby Lofts, MD;  Location: [facility] OR;  Service: Orthopedics;  Laterality: Right;   left knee arthroscopy     ORIF HUMERUS FRACTURE  12/09/2011   Procedure: OPEN REDUCTION INTERNAL FIXATION (ORIF) DISTAL HUMERUS FRACTURE;  Surgeon: Sharma Covert, MD;  Location: [facility] ORS;  Service: Orthopedics;  Laterality: Right;   OTHER SURGICAL HISTORY     left small toe bone spur removed    TONSILLECTOMY       OB History   No obstetric history on file.     Family History  Problem Relation Age of Onset   Heart Problems Mother    Heart attack Maternal Grandmother        died from a "heart attack"   Stroke Neg Hx    Arrhythmia Neg  Hx     Social History   Tobacco Use   Smoking status: Former    Types: Cigarettes    Quit date: 12/29/2009    Years since quitting: 11.8   Smokeless tobacco: Never  Vaping Use   Vaping Use: Never used  Substance Use Topics   Alcohol use: No    Alcohol/week: 0.0 standard drinks   Drug use: No    Home Medications Prior to Admission medications   Medication Sig Start Date End Date Taking? Authorizing Provider  acetaminophen (TYLENOL) 325 MG tablet Take 1 tablet (325 mg total) by mouth every 6 (six) hours as needed for moderate pain. Patient not taking: Reported on 09/26/2021 10/10/20   Rayna Sexton, PA-C   acetaminophen (TYLENOL) 500 MG tablet Take 500 mg by mouth every 8 (eight) hours as needed for mild pain.    [provider]  apixaban (ELIQUIS) 2.5 MG TABS tablet Take 1 tablet (2.5 mg total) by mouth 2 (two) times daily. 08/16/21   Little Ishikawa, MD  aspirin 325 MG tablet Take 325 mg by mouth daily. Patient not taking: Reported on 11/08/2021    [provider]  aspirin 81 MG EC tablet 1 tablet    [provider]  Calcium Carbonate-Vitamin D3 600-400 MG-UNIT TABS Take 2 tablets by mouth every morning.    [provider]  cephALEXin (KEFLEX) 500 MG capsule Take 1 capsule (500 mg total) by mouth 3 (three) times daily for 7 days. 11/08/21 11/15/21  Fatima Blank, MD  cyanocobalamin 1000 MCG tablet 1 tablet    [provider]  docusate sodium (COLACE) 100 MG capsule Take 1 capsule (100 mg total) by mouth every 12 (twelve) hours. Patient taking differently: Take 100 mg by mouth See admin instructions. 100mg  every 12 hours  AND 100mg  daily as needed for constipation 12/27/16   Ward, Cyril Mourning N, DO  feeding supplement (ENSURE ENLIVE / ENSURE PLUS) LIQD Take 237 mLs by mouth 2 (two) times daily between meals. 09/30/21   Terrilee Croak, MD  HYDROcodone-acetaminophen (NORCO/VICODIN) 5-325 MG tablet Take 1 tablet by mouth every 6 (six) hours as needed for severe pain. 09/29/21   Corinne Ports, PA-C  Lidocaine 4 % PTCH 1 patch    [provider]  Melatonin 3 MG TABS Take 3 mg by mouth at bedtime.    [provider]  memantine (NAMENDA) 10 MG tablet Take 10 mg by mouth 2 (two) times daily.    [provider]  Menthol, Topical Analgesic, (BIOFREEZE PROFESSIONAL) 5 % GEL apply    [provider]  metoprolol tartrate (LOPRESSOR) 25 MG tablet Take 0.5 tablets (12.5 mg total) by mouth 2 (two) times daily. Patient taking differently: Take 25 mg by mouth 2 (two) times daily. 08/16/21   Little Ishikawa, MD   polyethylene glycol (MIRALAX / GLYCOLAX) 17 g packet Take 17 g by mouth daily as needed for mild constipation. 09/30/21   Dahal, Marlowe Aschoff, MD  senna-docusate (SENOKOT-S) 8.6-50 MG tablet Take 1 tablet by mouth 2 (two) times daily. 09/30/21   Terrilee Croak, MD  sertraline (ZOLOFT) 100 MG tablet Take 100 mg by mouth at bedtime.    [provider]  simvastatin (ZOCOR) 20 MG tablet Take 1 tablet (20 mg total) by mouth daily at 6 PM. 08/19/21   Little Ishikawa, MD  vitamin B-12 (CYANOCOBALAMIN) 1000 MCG tablet Take 1,000 mcg by mouth 2 (two) times daily.    [provider]  Vitamin D3 (  VITAMIN D) 25 MCG tablet Take 1 tablet (1,000 Units total) by mouth daily. 09/29/21 12/28/21  Corinne Ports, PA-C    Allergies    Sulfa antibiotics and Latex  Review of Systems   Review of Systems  Unable to perform ROS: Dementia   Physical Exam Updated Vital Signs BP 136/72 (BP Location: Right Arm)    Pulse 88    Temp 98.1 F (36.7 C) (Oral)    Resp 18    Ht 5\' 2"  (1.575 m)    Wt 68 kg    SpO2 96%    BMI 27.42 kg/m   Physical Exam Vitals and nursing note reviewed.  Constitutional:      General: She is not in acute distress.    Appearance: She is well-developed.  HENT:     Head: Normocephalic.   Eyes:     Conjunctiva/sclera: Conjunctivae normal.  Cardiovascular:     Rate and Rhythm: Normal rate and regular rhythm.  Pulmonary:     Effort: Pulmonary effort is normal. No respiratory distress.     Breath sounds: Normal breath sounds. No stridor.  Abdominal:     General: There is no distension.  Skin:    General: Skin is warm and dry.  Neurological:     Mental Status: She is alert.     Cranial Nerves: No cranial nerve deficit.     Comments: Moves all extremities spontaneously to command, face is symmetric, speech is clear, but the patient has obvious dementia  Psychiatric:        Cognition and Memory: Cognition is impaired. Memory is impaired.    ED Results / Procedures /  Treatments   Labs (all labs ordered are listed, but only abnormal results are displayed) Labs Reviewed - No data to display  EKG None  Radiology DG Chest 1 View  Result Date: 11/08/2021 CLINICAL DATA:  Witnessed fall. EXAM: CHEST  1 VIEW COMPARISON:  September 26, 2021. FINDINGS: The heart size and mediastinal contours are within normal limits. Both lungs are clear. The visualized skeletal structures are unremarkable. IMPRESSION: No active disease. Electronically Signed   By: Marijo Conception M.D.   On: 11/08/2021 13:22   CT HEAD WO CONTRAST  Result Date: 11/08/2021 CLINICAL DATA:  Head trauma.  Fall this morning. EXAM: CT HEAD WITHOUT CONTRAST TECHNIQUE: Contiguous axial images were obtained from the base of the skull through the vertex without intravenous contrast. COMPARISON:  CT head dated September 26, 2021. FINDINGS: Brain: No evidence of acute infarction, hemorrhage, hydrocephalus, extra-axial collection or mass lesion/mass effect. Moderate cerebral atrophy. Stable left cerebellar encephalomalacia, likely secondary to prior infarct. Vascular: No hyperdense vessel or unexpected calcification. Skull: Right parietal scalp soft tissue swelling without evidence of calvarial fracture. Sinuses/Orbits: Mucosal thickening of the right maxillary sinus. The remaining paranasal sinuses are clear. Other: None. IMPRESSION: 1.  No acute intracranial abnormality. 2. Right parietal soft tissue swelling without evidence of calvarial fracture. 3.  Mild paranasal sinus disease. 4. Moderate cerebral volume loss and probably chronic left cerebellar infarct, unchanged. Electronically Signed   By: Keane Police D.O.   On: 11/08/2021 13:45   CT CERVICAL SPINE WO CONTRAST  Result Date: 11/08/2021 CLINICAL DATA:  Neck trauma (Age >= 65y), witnessed fall EXAM: CT CERVICAL SPINE WITHOUT CONTRAST TECHNIQUE: Multidetector CT imaging of the cervical spine was performed without intravenous contrast. Multiplanar CT image  reconstructions were also generated. COMPARISON:  09/26/2021, 09/24/2021, 08/09/2021 FINDINGS: Alignment: Facet joints are aligned without dislocation or traumatic listhesis.  Dens and lateral masses are aligned. Skull base and vertebrae: Mild superior endplate compression fracture of T3 is new from prior chest CT of 08/09/2021 (series 7, image 25). Cervical vertebral bodies are intact without evidence of fracture. No suspicious lytic or sclerotic bone lesion. Soft tissues and spinal canal: No prevertebral fluid or swelling. No visible canal hematoma. Disc levels: Similar degree of degenerative disc disease most prevalent at the C5-6 and C6-7 levels. Bilateral facet and uncovertebral arthropathy. Upper chest: Included lung apices are clear. Other: Bilateral carotid atherosclerosis. IMPRESSION: 1. No acute fracture or traumatic listhesis of the cervical spine. 2. Acute-to-subacute mild superior endplate compression fracture of T3 is new from prior CT of 09/24/2021. 3. Multilevel cervical spondylosis. Electronically Signed   By: Davina Poke D.O.   On: 11/08/2021 13:50   DG Hip Unilat W or Wo Pelvis 2-3 Views Right  Result Date: 11/08/2021 CLINICAL DATA:  Right hip pain after fall today. EXAM: DG HIP (WITH OR WITHOUT PELVIS) 2-3V RIGHT COMPARISON:  September 27, 2021. FINDINGS: Status post surgical internal fixation of proximal right femoral fracture. This is unchanged compared to prior exam. No new fracture or dislocation is noted. IMPRESSION: Postsurgical changes as described above. No acute abnormality is noted. Electronically Signed   By: Marijo Conception M.D.   On: 11/08/2021 13:21    Procedures Procedures   Medications Ordered in ED Medications - No data to display  ED Course  I have reviewed the triage vital signs and the nursing notes.  Pertinent labs & imaging results that were available during my care of the patient were reviewed by me and considered in my medical decision making (see chart  for details).   12:16 PM Patient in no distress.  I reviewed the CT scans, she has remained hemodynamically unremarkable, moving all extremities throughout her time in the emergency department.  With reassuring CT scans, and thorough evaluation within the past 24 hours no additional indication for labs.  With reassuring CT scans as above, patient appropriate for return to her facility. MDM Rules/Calculators/A&P MDM Number of Diagnoses or Management Options Fall, initial encounter: new, needed workup Injury of head, initial encounter: new, needed workup   Amount and/or Complexity of Data Reviewed Clinical lab tests: reviewed Tests in the radiology section of CPT: ordered and reviewed Tests in the medicine section of CPT: reviewed Decide to obtain previous medical records or to obtain history from someone other than the patient: yes Obtain history from someone other than the patient: yes Review and summarize past medical records: yes Independent visualization of images, tracings, or specimens: yes  Risk of Complications, Morbidity, and/or Mortality Presenting problems: high Diagnostic procedures: high Management options: high  Critical Care Total time providing critical care: < 30 minutes  Patient Progress Patient progress: stable   Final Clinical Impression(s) / ED Diagnoses Final diagnoses:  Fall, initial encounter  Injury of head, initial encounter     Carmin Muskrat, MD 11/09/21 1217

## 2021-11-09 NOTE — ED Notes (Signed)
Pt given chocolate ice cream °

## 2021-11-09 NOTE — Discharge Instructions (Addendum)
As discussed, your evaluation today after this episode of falling has been largely reassuring.  But, it is important that you monitor your condition carefully, and do not hesitate to return to the ED if you develop new, or concerning changes in your condition.  Otherwise, please follow-up with your physician for appropriate ongoing care.  Please use the discharge instructions provided earlier today for appropriate ongoing care of your thoracic spine compression fracture.

## 2021-11-09 NOTE — ED Notes (Signed)
RN called report to [facility].

## 2021-11-09 NOTE — ED Notes (Signed)
Resting quietly with eyes closed. No signs of distress.

## 2021-11-09 NOTE — ED Notes (Signed)
Redirected to stay in bed by folding a stack of washcloths. Patient folded them contently. Call bell in reach. Side rails up x 2. In view of nurses station for safety

## 2021-11-09 NOTE — ED Notes (Signed)
Calls out often, confused. Has to be reminded to stay in bed often.

## 2021-11-09 NOTE — ED Notes (Signed)
[facility] was called, No ETA °

## 2021-11-09 NOTE — ED Notes (Signed)
Patient added to list to be transported by [facility]. Pt is 9th on the list.

## 2022-02-08 ENCOUNTER — Other Ambulatory Visit: Payer: Self-pay

## 2022-02-08 ENCOUNTER — Emergency Department (HOSPITAL_COMMUNITY)
Admission: EM | Admit: 2022-02-08 | Discharge: 2022-02-08 | Disposition: A | Discharge status: Home / Self Care | Payer: Medicare Other | Attending: Emergency Medicine | Admitting: Emergency Medicine

## 2022-02-08 ENCOUNTER — Emergency Department (HOSPITAL_COMMUNITY): Payer: Medicare Other

## 2022-02-08 DIAGNOSIS — S32511A Fracture of superior rim of right pubis, initial encounter for closed fracture: Secondary | ICD-10-CM | POA: Insufficient documentation

## 2022-02-08 DIAGNOSIS — S0990XA Unspecified injury of head, initial encounter: Secondary | ICD-10-CM | POA: Insufficient documentation

## 2022-02-08 DIAGNOSIS — Z91040 Latex allergy status: Secondary | ICD-10-CM | POA: Diagnosis not present

## 2022-02-08 DIAGNOSIS — W050XXA Fall from non-moving wheelchair, initial encounter: Secondary | ICD-10-CM | POA: Insufficient documentation

## 2022-02-08 DIAGNOSIS — S32591A Other specified fracture of right pubis, initial encounter for closed fracture: Secondary | ICD-10-CM

## 2022-02-08 DIAGNOSIS — E119 Type 2 diabetes mellitus without complications: Secondary | ICD-10-CM | POA: Insufficient documentation

## 2022-02-08 DIAGNOSIS — F0280 Dementia in other diseases classified elsewhere without behavioral disturbance: Secondary | ICD-10-CM | POA: Insufficient documentation

## 2022-02-08 DIAGNOSIS — Z7901 Long term (current) use of anticoagulants: Secondary | ICD-10-CM | POA: Diagnosis not present

## 2022-02-08 DIAGNOSIS — G309 Alzheimer's disease, unspecified: Secondary | ICD-10-CM | POA: Diagnosis not present

## 2022-02-08 DIAGNOSIS — Z79899 Other long term (current) drug therapy: Secondary | ICD-10-CM | POA: Diagnosis not present

## 2022-02-08 DIAGNOSIS — Z7982 Long term (current) use of aspirin: Secondary | ICD-10-CM | POA: Insufficient documentation

## 2022-02-08 DIAGNOSIS — I10 Essential (primary) hypertension: Secondary | ICD-10-CM | POA: Diagnosis not present

## 2022-02-08 DIAGNOSIS — D72829 Elevated white blood cell count, unspecified: Secondary | ICD-10-CM | POA: Insufficient documentation

## 2022-02-08 LAB — BASIC METABOLIC PANEL
Anion gap: 12 (ref 5–15)
BUN: 21 mg/dL (ref 8–23)
CO2: 23 mmol/L (ref 22–32)
Calcium: 9.1 mg/dL (ref 8.9–10.3)
Chloride: 104 mmol/L (ref 98–111)
Creatinine, Ser: 1.02 mg/dL — ABNORMAL HIGH (ref 0.44–1.00)
GFR, Estimated: 53 mL/min — ABNORMAL LOW (ref >60)
Glucose, Bld: 103 mg/dL — ABNORMAL HIGH (ref 70–99)
Potassium: 4.1 mmol/L (ref 3.5–5.1)
Sodium: 139 mmol/L (ref 135–145)

## 2022-02-08 LAB — CBC WITH DIFFERENTIAL/PLATELET
Abs Immature Granulocytes: 0.13 10*3/uL — ABNORMAL HIGH (ref 0.00–0.07)
Basophils Absolute: 0.1 10*3/uL (ref 0.0–0.1)
Basophils Relative: 0 %
Eosinophils Absolute: 0.2 10*3/uL (ref 0.0–0.5)
Eosinophils Relative: 1 %
HCT: 43.8 % (ref 36.0–46.0)
Hemoglobin: 13.5 g/dL (ref 12.0–15.0)
Immature Granulocytes: 1 %
Lymphocytes Relative: 14 %
Lymphs Abs: 2.1 10*3/uL (ref 0.7–4.0)
MCH: 28.5 pg (ref 26.0–34.0)
MCHC: 30.8 g/dL (ref 30.0–36.0)
MCV: 92.4 fL (ref 80.0–100.0)
Monocytes Absolute: 0.7 10*3/uL (ref 0.1–1.0)
Monocytes Relative: 5 %
Neutro Abs: 11.6 10*3/uL — ABNORMAL HIGH (ref 1.7–7.7)
Neutrophils Relative %: 79 %
Platelets: 273 10*3/uL (ref 150–400)
RBC: 4.74 MIL/uL (ref 3.87–5.11)
RDW: 13.8 % (ref 11.5–15.5)
WBC: 14.7 10*3/uL — ABNORMAL HIGH (ref 4.0–10.5)
nRBC: 0 % (ref 0.0–0.2)

## 2022-02-08 MED ORDER — ACETAMINOPHEN 325 MG PO TABS
650.0000 mg | ORAL_TABLET | Freq: Once | ORAL | Status: AC
  Administered 2022-02-08: 650 mg via ORAL
  Filled 2022-02-08: qty 2

## 2022-02-08 MED ORDER — ONDANSETRON 4 MG PO TBDP
4.0000 mg | ORAL_TABLET | Freq: Once | ORAL | Status: DC
  Filled 2022-02-08 (×2): qty 1

## 2022-02-08 MED ORDER — HYDROCODONE-ACETAMINOPHEN 5-325 MG PO TABS: 1.0000 | ORAL_TABLET | Freq: Four times a day (QID) | ORAL | 0 refills | Status: DC | PRN

## 2022-02-08 MED ORDER — BACITRACIN ZINC 500 UNIT/GM EX OINT
1.0000 "application " | TOPICAL_OINTMENT | Freq: Once | CUTANEOUS | Status: AC
  Administered 2022-02-08: 1 via TOPICAL
  Filled 2022-02-08: qty 0.9

## 2022-02-08 NOTE — Discharge Instructions (Signed)
The x-ray does show a fracture in the pelvis bone.  This is the source of pain in your right hip area.  You may continue to bear weight on it as tolerated.  Make sure to use your walker.  Follow-up with an orthopedic doctor for further treatment  ?

## 2022-02-08 NOTE — ED Notes (Signed)
Report given to [place] RN at [facility] ?

## 2022-02-08 NOTE — Progress Notes (Signed)
Chaplain responded to level II fall on thinners.  EMT indicated patient came from a facility and unsure if family had been contacted, yet.  Patient is currently unavailable.  Chaplain available for patient/family support as needed. ?Chaplain Agustin Cree, [place]. ? ? ? 02/08/22 1800  ?Clinical Encounter Type  ?Visited With Health care provider;Patient not available  ?Visit Type Initial;Trauma;ED  ?Referral From Nurse  ?Consult/Referral To Chaplain  ? ? ?

## 2022-02-08 NOTE — Progress Notes (Signed)
Orthopedic Tech Progress Note ?Patient Details:  ?[name] ?12/30/1931 ?[id number] ? ?Level 2 trauma  ? ?Patient ID: [name], female   DOB: November 29, 1931, 88 y.o.   MRN: [id number] ? ?Donald Pore ?02/08/2022, 6:54 PM ? ?

## 2022-02-08 NOTE — ED Provider Notes (Addendum)
?[facility] ?Provider Note ? ? ?CSN: [id number] ?Arrival date & time: 02/08/22  1748 ? ?  ? ?History ? ?Chief Complaint  ?Patient presents with  ? Fall  ? ? ?[name] is a 88 y.o. female. ? ? ?Fall ?Pertinent negatives include no chest pain and no shortness of breath.  ? ?Patient has history of hypertension, hypercholesterolemia, stroke, diabetes, hyperlipidemia as well as Alzheimer's dementia.  Patient is a resident of a nursing facility.  Patient ended up falling out of her wheelchair after leaning.  Patient noted to have pain in her right elbow and right hip.  Patient is on anticoagulation and was noted to have a bruise around her right ear and was complaining of a headache.  She was activated as a level 2 trauma.  Patient does not recall what occurred but does have a headache.  She is able to answer questions otherwise appropriately.  She is complaining some pain in her elbow and some mild soreness in her right hip ? ?Home Medications ?Prior to Admission medications   ?Medication Sig Start Date End Date Taking? Authorizing Provider  ?HYDROcodone-acetaminophen (NORCO/VICODIN) 5-325 MG tablet Take 1 tablet by mouth every 6 (six) hours as needed. 02/08/22  Yes Dorie Rank, MD  ?acetaminophen (TYLENOL) 325 MG tablet Take 1 tablet (325 mg total) by mouth every 6 (six) hours as needed for moderate pain. ?Patient not taking: Reported on 09/26/2021 10/10/20   Rayna Sexton, PA-C  ?acetaminophen (TYLENOL) 500 MG tablet Take 500 mg by mouth in the morning and at bedtime.    [provider]  ?apixaban (ELIQUIS) 2.5 MG TABS tablet Take 1 tablet (2.5 mg total) by mouth 2 (two) times daily. 08/16/21   Little Ishikawa, MD  ?aspirin 81 MG EC tablet Take 81 mg by mouth daily.    [provider]  ?Calcium Carbonate Antacid (TUMS CHEWY BITES PO) Take 200 mg by mouth in the morning and at bedtime.    [provider]  ?cyanocobalamin 1000 MCG tablet Take  1,000 mcg by mouth daily.    [provider]  ?docusate sodium (COLACE) 100 MG capsule Take 1 capsule (100 mg total) by mouth every 12 (twelve) hours. ?Patient not taking: Reported on 11/09/2021 12/27/16   Ward, Delice Bison, DO  ?Ergocalciferol (VITAMIN D2 PO) Take 1,250 mcg by mouth once a week.    [provider]  ?feeding supplement (ENSURE ENLIVE / ENSURE PLUS) LIQD Take 237 mLs by mouth 2 (two) times daily between meals. ?Patient not taking: Reported on 11/09/2021 09/30/21   Terrilee Croak, MD  ?ferrous sulfate 325 (65 FE) MG tablet Take 325 mg by mouth daily with breakfast.    [provider]  ?Lidocaine 4 % PTCH Apply 1 patch topically every 12 (twelve) hours.    [provider]  ?Melatonin 3 MG TABS Take 3 mg by mouth at bedtime.    [provider]  ?memantine (NAMENDA) 10 MG tablet Take 10 mg by mouth 2 (two) times daily.    [provider]  ?Menthol, Topical Analgesic, (BIOFREEZE PROFESSIONAL) 5 % GEL Apply 1 application topically in the morning, at noon, and at bedtime. Apply on right knee    [provider]  ?metoprolol tartrate (LOPRESSOR) 25 MG tablet Take 0.5 tablets (12.5 mg total) by mouth 2 (two) times daily. 08/16/21   Little Ishikawa, MD  ?polyethylene glycol (MIRALAX / GLYCOLAX) 17 g packet Take 17 g by mouth daily as needed  for mild constipation. 09/30/21   Terrilee Croak, MD  ?senna-docusate (SENOKOT-S) 8.6-50 MG tablet Take 1 tablet by mouth 2 (two) times daily. ?Patient taking differently: Take 2 tablets by mouth at bedtime. 09/30/21   Terrilee Croak, MD  ?sertraline (ZOLOFT) 100 MG tablet Take 100 mg by mouth at bedtime.    [provider]  ?simvastatin (ZOCOR) 20 MG tablet Take 1 tablet (20 mg total) by mouth daily at 6 PM. 08/19/21   Little Ishikawa, MD  ?   ? ?Allergies    ?Sulfa antibiotics and Latex   ? ?Review of Systems   ?Review of Systems  ?Constitutional:  Negative for fever.  ?Respiratory:  Negative for  shortness of breath.   ?Cardiovascular:  Negative for chest pain.  ? ?Physical Exam ?Updated Vital Signs ?BP (!) 152/63   Pulse 98   Temp 97.9 ?F (36.6 ?C) (Oral)   Resp 20   Ht 1.575 m (5\' 2" )   Wt 67.6 kg   SpO2 90%   BMI 27.25 kg/m?  ?Physical Exam ?Vitals and nursing note reviewed.  ?Constitutional:   ?   General: She is not in acute distress. ?   Appearance: She is well-developed.  ?HENT:  ?   Head: Normocephalic and atraumatic.  ?   Right Ear: External ear normal.  ?   Left Ear: External ear normal.  ?Eyes:  ?   General: No scleral icterus.    ?   Right eye: No discharge.     ?   Left eye: No discharge.  ?   Conjunctiva/sclera: Conjunctivae normal.  ?Neck:  ?   Trachea: No tracheal deviation.  ?Cardiovascular:  ?   Rate and Rhythm: Normal rate and regular rhythm.  ?Pulmonary:  ?   Effort: Pulmonary effort is normal. No respiratory distress.  ?   Breath sounds: Normal breath sounds. No stridor. No wheezing or rales.  ?Abdominal:  ?   General: Bowel sounds are normal. There is no distension.  ?   Palpations: Abdomen is soft.  ?   Tenderness: There is no abdominal tenderness. There is no guarding or rebound.  ?Musculoskeletal:     ?   General: No deformity.  ?   Right elbow: Tenderness present.  ?   Cervical back: Neck supple. No tenderness.  ?   Thoracic back: No tenderness.  ?   Lumbar back: No tenderness.  ?   Right hip: Normal. No tenderness or bony tenderness. Normal range of motion.  ?   Left hip: Normal. No tenderness or bony tenderness. Normal range of motion.  ?   Comments: Mild tenderness right elbow, abrasion noted, no tenderness to palpation bilateral shoulders, wrists, knees, ankles  ?Skin: ?   General: Skin is warm and dry.  ?   Findings: No rash.  ?Neurological:  ?   General: No focal deficit present.  ?   Mental Status: She is alert.  ?   Cranial Nerves: No cranial nerve deficit (no facial droop, extraocular movements intact, no slurred speech).  ?   Sensory: No sensory deficit.  ?   Motor:  No abnormal muscle tone or seizure activity.  ?   Coordination: Coordination normal.  ?Psychiatric:     ?   Mood and Affect: Mood normal.  ? ? ?ED Results / Procedures / Treatments   ?Labs ?(all labs ordered are listed, but only abnormal results are displayed) ?Labs Reviewed  ?BASIC METABOLIC PANEL - Abnormal; Notable for the following components:  ?  Result Value  ? Glucose, Bld 103 (*)   ? Creatinine, Ser 1.02 (*)   ? GFR, Estimated 53 (*)   ? All other components within normal limits  ?CBC WITH DIFFERENTIAL/PLATELET - Abnormal; Notable for the following components:  ? WBC 14.7 (*)   ? Neutro Abs 11.6 (*)   ? Abs Immature Granulocytes 0.13 (*)   ? All other components within normal limits  ? ? ?EKG ?EKG Interpretation ? ?Date/Time:  Tuesday February 08 2022 18:15:55 EDT ?Ventricular Rate:  78 ?PR Interval:  176 ?QRS Duration: 115 ?QT Interval:  402 ?QTC Calculation: 461 ?R Axis:   -9 ?Text Interpretation: Sinus rhythm Nonspecific intraventricular conduction delay , new since last tracing Probable anterior infarct, old Nonspecific T abnormalities, lateral leads Confirmed by Dorie Rank [phone]) on 02/08/2022 10:02:10 PM ? ?Radiology ?DG Elbow Complete Right ? ?Result Date: 02/08/2022 ?CLINICAL DATA:  Pain after fall. EXAM: RIGHT ELBOW - COMPLETE 3+ VIEW COMPARISON:  None. FINDINGS: No definite acute fracture or dislocation. Extensive orthopedic fixation hardware seen in the distal humerus and proximal ulna. There is no evidence for hardware loosening. Soft tissues are grossly within normal limits. Joint spaces appear maintained. IMPRESSION: 1. Extensive orthopedic hardware in the distal humerus and proximal ulna. No evidence for hardware complication. 2.  No acute fracture or dislocation. Electronically Signed   By: Ronney Asters M.D.   On: 02/08/2022 19:37  ? ?CT Head Wo Contrast ? ?Result Date: 02/08/2022 ?CLINICAL DATA:  Head trauma, minor (Age >= 65y), fall from wheelchair EXAM: CT HEAD WITHOUT CONTRAST TECHNIQUE:  Contiguous axial images were obtained from the base of the skull through the vertex without intravenous contrast. RADIATION DOSE REDUCTION: This exam was performed according to the departmental dose-optimization pro

## 2022-02-08 NOTE — ED Triage Notes (Signed)
Pt bib [facility] from [facility] with complaints of a fall out of her wheelchair. Pt arrives with right elbow pain and right hip pain on movement. Pt has abrasion to right elbow and hematoma above right eye and on right ear. Pt is AOx3 at baseline with dementia. Pt is on elliquis.  ?EMS vitals: 124/82,70HR ?

## 2022-08-03 ENCOUNTER — Other Ambulatory Visit: Payer: Self-pay

## 2022-08-03 ENCOUNTER — Emergency Department (HOSPITAL_COMMUNITY)
Admission: EM | Admit: 2022-08-03 | Discharge: 2022-08-03 | Disposition: A | Discharge status: Home / Self Care | Payer: Medicare Other | Attending: Emergency Medicine | Admitting: Emergency Medicine

## 2022-08-03 ENCOUNTER — Emergency Department (HOSPITAL_COMMUNITY): Payer: Medicare Other

## 2022-08-03 ENCOUNTER — Encounter (HOSPITAL_COMMUNITY): Payer: Self-pay

## 2022-08-03 DIAGNOSIS — I129 Hypertensive chronic kidney disease with stage 1 through stage 4 chronic kidney disease, or unspecified chronic kidney disease: Secondary | ICD-10-CM | POA: Diagnosis not present

## 2022-08-03 DIAGNOSIS — N183 Chronic kidney disease, stage 3 unspecified: Secondary | ICD-10-CM | POA: Diagnosis not present

## 2022-08-03 DIAGNOSIS — Z87891 Personal history of nicotine dependence: Secondary | ICD-10-CM | POA: Insufficient documentation

## 2022-08-03 DIAGNOSIS — S0993XA Unspecified injury of face, initial encounter: Secondary | ICD-10-CM | POA: Diagnosis present

## 2022-08-03 DIAGNOSIS — W19XXXA Unspecified fall, initial encounter: Secondary | ICD-10-CM | POA: Insufficient documentation

## 2022-08-03 DIAGNOSIS — S0083XA Contusion of other part of head, initial encounter: Secondary | ICD-10-CM | POA: Insufficient documentation

## 2022-08-03 DIAGNOSIS — E1122 Type 2 diabetes mellitus with diabetic chronic kidney disease: Secondary | ICD-10-CM | POA: Insufficient documentation

## 2022-08-03 NOTE — ED Notes (Signed)
Pt's daughter called to inform of situation.

## 2022-08-03 NOTE — ED Provider Notes (Signed)
[facility]ENCY DEPT [facility] Emergency Department Provider Note MRN:  [id number]  Arrival date & time: 08/03/22     Chief Complaint   Fall   History of Present Illness   [name] is a 88 y.o. year-old female with a history of Alzheimer's presenting to the ED with chief complaint of fall.  Unwitnessed fall, suspect she rolled out of bed.  Coming from [facility] at [facility].  Bruising to the left forehead  Review of Systems  I was unable to obtain a full/accurate HPI, PMH, or ROS due to the patient's dementia.  Patient's Health History    Past Medical History:  Diagnosis Date   Allergies    Alzheimer disease (HCC)    STABLE   Anemia, iron deficiency 12/12/2011   Arthritis    RF   CKD (chronic kidney disease)    STAGE 3   Diabetes mellitus    no mes, diet only    Diarrhea    RESOLVED   Hypercholesterolemia    Hyperlipemia    Hypertension    Major depression    OA (osteoarthritis)    HAND/LEFT KNEE PAIN MILD   Osteoporosis    Stroke [facility])    Syncope     Past Surgical History:  Procedure Laterality Date   INTRAMEDULLARY (IM) NAIL INTERTROCHANTERIC Right 09/27/2021   Procedure: INTRAMEDULLARY (IM) NAIL INTERTROCHANTRIC;  Surgeon: Roby Lofts, MD;  Location: [facility] OR;  Service: Orthopedics;  Laterality: Right;   left knee arthroscopy     ORIF HUMERUS FRACTURE  12/09/2011   Procedure: OPEN REDUCTION INTERNAL FIXATION (ORIF) DISTAL HUMERUS FRACTURE;  Surgeon: Sharma Covert, MD;  Location: [facility];  Service: Orthopedics;  Laterality: Right;   OTHER SURGICAL HISTORY     left small toe bone spur removed    TONSILLECTOMY      Family History  Problem Relation Age of Onset   Heart Problems Mother    Heart attack Maternal Grandmother        died from a "heart attack"   Stroke Neg Hx    Arrhythmia Neg Hx     Social History   Socioeconomic History   Marital status: Divorced    Spouse name: Not on file   Number of children: 2   Years of education:  Not on file   Highest education level: Not on file  Occupational History   Occupation: Retired  Tobacco Use   Smoking status: Former    Types: Cigarettes    Quit date: 12/29/2009    Years since quitting: 12.6   Smokeless tobacco: Never  Vaping Use   Vaping Use: Never used  Substance and Sexual Activity   Alcohol use: No    Alcohol/week: 0.0 standard drinks of alcohol   Drug use: No   Sexual activity: Never  Other Topics Concern   Not on file  Social History Narrative   Not on file   Social Determinants of Health   Financial Resource Strain: Not on file  Food Insecurity: Not on file  Transportation Needs: Not on file  Physical Activity: Not on file  Stress: Not on file  Social Connections: Not on file  Intimate Partner Violence: Not on file     Physical Exam   Vitals:   08/03/22 0441 08/03/22 0445  BP:  (!) 158/53  Pulse: 70 67  Resp:    Temp:    SpO2: 98% 95%    CONSTITUTIONAL: Chronically ill-appearing, NAD NEURO/PSYCH: Awake, alert, not oriented, moves all  extremities EYES:  eyes equal and reactive ENT/NECK:  no LAD, no JVD CARDIO: Regular rate, well-perfused, normal S1 and S2 PULM:  CTAB no wheezing or rhonchi GI/GU:  non-distended, non-tender MSK/SPINE:  No gross deformities, no edema SKIN:  no rash, atraumatic   *Additional and/or pertinent findings included in MDM below  Diagnostic and Interventional Summary    EKG Interpretation  Date/Time:    Ventricular Rate:    PR Interval:    QRS Duration:   QT Interval:    QTC Calculation:   R Axis:     Text Interpretation:         Labs Reviewed - No data to display  DG Hip Unilat W or Wo Pelvis 2-3 Views Left  Final Result    DG Knee Complete 4 Views Left  Final Result    CT HEAD WO CONTRAST ( )  Final Result    CT CERVICAL SPINE WO CONTRAST  Final Result      Medications - No data to display   Procedures  /  Critical Care Procedures  ED Course and Medical Decision Making   Initial Impression and Ddx Suspected mechanical fall, evidence of head trauma with bruising to the forehead.  Also having some moderate pain with range of motion of the left hip/knee.  Awaiting imaging.  Past medical/surgical history that increases complexity of ED encounter: Dementia  Interpretation of Diagnostics I personally reviewed the hip x-ray and my interpretation is as follows: No obvious fracture  CT and x-ray imaging is without acute injury.  Patient Reassessment and Ultimate Disposition/Management     Appropriate for discharge.  Unable to reach daughter by phone.  Going back to facility.  Patient management required discussion with the following services or consulting groups:  None  Complexity of Problems Addressed Acute illness or injury that poses threat of life of bodily function  Additional Data Reviewed and Analyzed Further history obtained from: None  Additional Factors Impacting ED Encounter Risk None  Elmer Sow. Pilar Plate, MD [facility] Emergency Medicine [facility] [email or web address]  Final Clinical Impressions(s) / ED Diagnoses     ICD-10-CM   1. Contusion of face, initial encounter  S00.83XA     2. Fall, initial encounter  W19.[facility]       ED Discharge Orders     None        Discharge Instructions Discussed with and Provided to Patient:     Discharge Instructions      You were evaluated in the Emergency Department and after careful evaluation, we did not find any emergent condition requiring admission or further testing in the hospital.  Your exam/testing today is overall reassuring.  X-rays and CT scans did not show any significant injuries.  Recommend Tylenol as needed for pain.  Please return to the Emergency Department if you experience any worsening of your condition.   Thank you for allowing [place] to be a part of your care.       Sabas Sous, MD 08/03/22 [phone]

## 2022-08-03 NOTE — Discharge Instructions (Signed)
You were evaluated in the Emergency Department and after careful evaluation, we did not find any emergent condition requiring admission or further testing in the hospital.  Your exam/testing today is overall reassuring.  X-rays and CT scans did not show any significant injuries.  Recommend Tylenol as needed for pain.  Please return to the Emergency Department if you experience any worsening of your condition.   Thank you for allowing [place] to be a part of your care.

## 2022-08-03 NOTE — ED Triage Notes (Signed)
Arrives EMS from [facility] after unwitnessed fall. Its suspected she rolled out of the bed.   Hematoma on left anterior forehead. Reported she does not like any anticoagulants.   Pt does not like triage nurse and says she doesn't want to answer questions.   DNR at bedside.

## 2022-08-06 ENCOUNTER — Emergency Department (HOSPITAL_COMMUNITY): Payer: Medicare Other

## 2022-08-06 ENCOUNTER — Inpatient Hospital Stay (HOSPITAL_COMMUNITY)
Admission: EM | Admit: 2022-08-06 | Discharge: 2022-08-09 | DRG: 690 | Disposition: A | Discharge status: Skilled Nursing Facility | Payer: Medicare Other | Source: Skilled Nursing Facility | Attending: Student | Admitting: Student

## 2022-08-06 ENCOUNTER — Other Ambulatory Visit: Payer: Self-pay

## 2022-08-06 ENCOUNTER — Encounter (HOSPITAL_COMMUNITY): Payer: Self-pay | Admitting: Internal Medicine

## 2022-08-06 ENCOUNTER — Observation Stay (HOSPITAL_COMMUNITY): Payer: Medicare Other

## 2022-08-06 DIAGNOSIS — N3001 Acute cystitis with hematuria: Principal | ICD-10-CM | POA: Diagnosis present

## 2022-08-06 DIAGNOSIS — I5032 Chronic diastolic (congestive) heart failure: Secondary | ICD-10-CM | POA: Diagnosis present

## 2022-08-06 DIAGNOSIS — Z8673 Personal history of transient ischemic attack (TIA), and cerebral infarction without residual deficits: Secondary | ICD-10-CM

## 2022-08-06 DIAGNOSIS — F32A Depression, unspecified: Secondary | ICD-10-CM | POA: Diagnosis present

## 2022-08-06 DIAGNOSIS — E7800 Pure hypercholesterolemia, unspecified: Secondary | ICD-10-CM | POA: Diagnosis present

## 2022-08-06 DIAGNOSIS — Z7982 Long term (current) use of aspirin: Secondary | ICD-10-CM

## 2022-08-06 DIAGNOSIS — E1122 Type 2 diabetes mellitus with diabetic chronic kidney disease: Secondary | ICD-10-CM | POA: Diagnosis present

## 2022-08-06 DIAGNOSIS — N3000 Acute cystitis without hematuria: Secondary | ICD-10-CM | POA: Diagnosis present

## 2022-08-06 DIAGNOSIS — S42202A Unspecified fracture of upper end of left humerus, initial encounter for closed fracture: Secondary | ICD-10-CM | POA: Diagnosis present

## 2022-08-06 DIAGNOSIS — I5030 Unspecified diastolic (congestive) heart failure: Secondary | ICD-10-CM | POA: Diagnosis not present

## 2022-08-06 DIAGNOSIS — S52501A Unspecified fracture of the lower end of right radius, initial encounter for closed fracture: Secondary | ICD-10-CM | POA: Diagnosis present

## 2022-08-06 DIAGNOSIS — B961 Klebsiella pneumoniae [K. pneumoniae] as the cause of diseases classified elsewhere: Secondary | ICD-10-CM | POA: Diagnosis present

## 2022-08-06 DIAGNOSIS — E118 Type 2 diabetes mellitus with unspecified complications: Secondary | ICD-10-CM

## 2022-08-06 DIAGNOSIS — W19XXXA Unspecified fall, initial encounter: Secondary | ICD-10-CM | POA: Diagnosis not present

## 2022-08-06 DIAGNOSIS — D72829 Elevated white blood cell count, unspecified: Secondary | ICD-10-CM | POA: Diagnosis present

## 2022-08-06 DIAGNOSIS — N189 Chronic kidney disease, unspecified: Secondary | ICD-10-CM

## 2022-08-06 DIAGNOSIS — S0003XA Contusion of scalp, initial encounter: Secondary | ICD-10-CM | POA: Diagnosis present

## 2022-08-06 DIAGNOSIS — Z882 Allergy status to sulfonamides status: Secondary | ICD-10-CM

## 2022-08-06 DIAGNOSIS — N289 Disorder of kidney and ureter, unspecified: Secondary | ICD-10-CM

## 2022-08-06 DIAGNOSIS — Z7901 Long term (current) use of anticoagulants: Secondary | ICD-10-CM

## 2022-08-06 DIAGNOSIS — Z91040 Latex allergy status: Secondary | ICD-10-CM

## 2022-08-06 DIAGNOSIS — F0283 Dementia in other diseases classified elsewhere, unspecified severity, with mood disturbance: Secondary | ICD-10-CM | POA: Diagnosis present

## 2022-08-06 DIAGNOSIS — S0083XA Contusion of other part of head, initial encounter: Secondary | ICD-10-CM

## 2022-08-06 DIAGNOSIS — G309 Alzheimer's disease, unspecified: Secondary | ICD-10-CM | POA: Diagnosis present

## 2022-08-06 DIAGNOSIS — I480 Paroxysmal atrial fibrillation: Secondary | ICD-10-CM | POA: Diagnosis present

## 2022-08-06 DIAGNOSIS — R000 Tachycardia, unspecified: Secondary | ICD-10-CM | POA: Diagnosis present

## 2022-08-06 DIAGNOSIS — I130 Hypertensive heart and chronic kidney disease with heart failure and stage 1 through stage 4 chronic kidney disease, or unspecified chronic kidney disease: Secondary | ICD-10-CM | POA: Diagnosis present

## 2022-08-06 DIAGNOSIS — S0511XA Contusion of eyeball and orbital tissues, right eye, initial encounter: Secondary | ICD-10-CM | POA: Diagnosis present

## 2022-08-06 DIAGNOSIS — E860 Dehydration: Secondary | ICD-10-CM | POA: Diagnosis present

## 2022-08-06 DIAGNOSIS — S52509A Unspecified fracture of the lower end of unspecified radius, initial encounter for closed fracture: Secondary | ICD-10-CM | POA: Diagnosis present

## 2022-08-06 DIAGNOSIS — Z9181 History of falling: Secondary | ICD-10-CM

## 2022-08-06 DIAGNOSIS — Z87891 Personal history of nicotine dependence: Secondary | ICD-10-CM

## 2022-08-06 DIAGNOSIS — I160 Hypertensive urgency: Secondary | ICD-10-CM | POA: Diagnosis present

## 2022-08-06 DIAGNOSIS — Z8249 Family history of ischemic heart disease and other diseases of the circulatory system: Secondary | ICD-10-CM

## 2022-08-06 DIAGNOSIS — S42295A Other nondisplaced fracture of upper end of left humerus, initial encounter for closed fracture: Secondary | ICD-10-CM | POA: Diagnosis present

## 2022-08-06 DIAGNOSIS — Z66 Do not resuscitate: Secondary | ICD-10-CM | POA: Diagnosis present

## 2022-08-06 DIAGNOSIS — E785 Hyperlipidemia, unspecified: Secondary | ICD-10-CM | POA: Diagnosis present

## 2022-08-06 DIAGNOSIS — E119 Type 2 diabetes mellitus without complications: Secondary | ICD-10-CM

## 2022-08-06 DIAGNOSIS — Z79899 Other long term (current) drug therapy: Secondary | ICD-10-CM

## 2022-08-06 DIAGNOSIS — Y92129 Unspecified place in nursing home as the place of occurrence of the external cause: Secondary | ICD-10-CM

## 2022-08-06 DIAGNOSIS — M810 Age-related osteoporosis without current pathological fracture: Secondary | ICD-10-CM | POA: Diagnosis present

## 2022-08-06 DIAGNOSIS — S32591D Other specified fracture of right pubis, subsequent encounter for fracture with routine healing: Secondary | ICD-10-CM

## 2022-08-06 DIAGNOSIS — S0003XD Contusion of scalp, subsequent encounter: Secondary | ICD-10-CM

## 2022-08-06 DIAGNOSIS — N1831 Chronic kidney disease, stage 3a: Secondary | ICD-10-CM | POA: Diagnosis present

## 2022-08-06 LAB — COMPREHENSIVE METABOLIC PANEL
ALT: 13 U/L (ref 0–44)
AST: 19 U/L (ref 15–41)
Albumin: 3.6 g/dL (ref 3.5–5.0)
Alkaline Phosphatase: 92 U/L (ref 38–126)
Anion gap: 12 (ref 5–15)
BUN: 29 mg/dL — ABNORMAL HIGH (ref 8–23)
CO2: 23 mmol/L (ref 22–32)
Calcium: 9.2 mg/dL (ref 8.9–10.3)
Chloride: 104 mmol/L (ref 98–111)
Creatinine, Ser: 1.29 mg/dL — ABNORMAL HIGH (ref 0.44–1.00)
GFR, Estimated: 40 mL/min — ABNORMAL LOW (ref >60)
Glucose, Bld: 120 mg/dL — ABNORMAL HIGH (ref 70–99)
Potassium: 3.8 mmol/L (ref 3.5–5.1)
Sodium: 139 mmol/L (ref 135–145)
Total Bilirubin: 0.9 mg/dL (ref 0.3–1.2)
Total Protein: 6.4 g/dL — ABNORMAL LOW (ref 6.5–8.1)

## 2022-08-06 LAB — URINALYSIS, ROUTINE W REFLEX MICROSCOPIC
Bilirubin Urine: NEGATIVE
Glucose, UA: NEGATIVE mg/dL
Ketones, ur: NEGATIVE mg/dL
Nitrite: POSITIVE — AB
Protein, ur: NEGATIVE mg/dL
Specific Gravity, Urine: 1.024 (ref 1.005–1.030)
WBC, UA: >50 WBC/hpf — ABNORMAL HIGH (ref 0–5)
pH: 5 (ref 5.0–8.0)

## 2022-08-06 LAB — TYPE AND SCREEN
ABO/RH(D): O POS
Antibody Screen: NEGATIVE

## 2022-08-06 LAB — CBC WITH DIFFERENTIAL/PLATELET
Abs Immature Granulocytes: 0.03 10*3/uL (ref 0.00–0.07)
Basophils Absolute: 0.1 10*3/uL (ref 0.0–0.1)
Basophils Relative: 1 %
Eosinophils Absolute: 0.2 10*3/uL (ref 0.0–0.5)
Eosinophils Relative: 2 %
HCT: 38.4 % (ref 36.0–46.0)
Hemoglobin: 12 g/dL (ref 12.0–15.0)
Immature Granulocytes: 0 %
Lymphocytes Relative: 18 %
Lymphs Abs: 2.2 10*3/uL (ref 0.7–4.0)
MCH: 29.6 pg (ref 26.0–34.0)
MCHC: 31.3 g/dL (ref 30.0–36.0)
MCV: 94.6 fL (ref 80.0–100.0)
Monocytes Absolute: 0.7 10*3/uL (ref 0.1–1.0)
Monocytes Relative: 6 %
Neutro Abs: 8.9 10*3/uL — ABNORMAL HIGH (ref 1.7–7.7)
Neutrophils Relative %: 73 %
Platelets: 253 10*3/uL (ref 150–400)
RBC: 4.06 MIL/uL (ref 3.87–5.11)
RDW: 12.8 % (ref 11.5–15.5)
WBC: 12.1 10*3/uL — ABNORMAL HIGH (ref 4.0–10.5)
nRBC: 0 % (ref 0.0–0.2)

## 2022-08-06 LAB — PROTIME-INR
INR: 1.2 (ref 0.8–1.2)
Prothrombin Time: 15.5 seconds — ABNORMAL HIGH (ref 11.4–15.2)

## 2022-08-06 LAB — I-STAT CHEM 8, ED
BUN: 34 mg/dL — ABNORMAL HIGH (ref 8–23)
Calcium, Ion: 1.09 mmol/L — ABNORMAL LOW (ref 1.15–1.40)
Chloride: 106 mmol/L (ref 98–111)
Creatinine, Ser: 1.2 mg/dL — ABNORMAL HIGH (ref 0.44–1.00)
Glucose, Bld: 116 mg/dL — ABNORMAL HIGH (ref 70–99)
HCT: 37 % (ref 36.0–46.0)
Hemoglobin: 12.6 g/dL (ref 12.0–15.0)
Potassium: 4.2 mmol/L (ref 3.5–5.1)
Sodium: 138 mmol/L (ref 135–145)
TCO2: 23 mmol/L (ref 22–32)

## 2022-08-06 LAB — CK: Total CK: 130 U/L (ref 38–234)

## 2022-08-06 LAB — TROPONIN I (HIGH SENSITIVITY)
Troponin I (High Sensitivity): 5 ng/L (ref <18)
Troponin I (High Sensitivity): 4 ng/L (ref <18)

## 2022-08-06 LAB — MRSA NEXT GEN BY PCR, NASAL: MRSA by PCR Next Gen: NOT DETECTED

## 2022-08-06 MED ORDER — FENTANYL CITRATE PF 50 MCG/ML IJ SOSY
50.0000 ug | PREFILLED_SYRINGE | Freq: Once | INTRAMUSCULAR | Status: AC
  Administered 2022-08-06: 50 ug via INTRAVENOUS
  Filled 2022-08-06: qty 1

## 2022-08-06 MED ORDER — ALBUTEROL SULFATE (2.5 MG/3ML) 0.083% IN NEBU: 2.5000 mg | INHALATION_SOLUTION | Freq: Four times a day (QID) | RESPIRATORY_TRACT | Status: DC | PRN

## 2022-08-06 MED ORDER — ENSURE ENLIVE PO LIQD
237.0000 mL | Freq: Two times a day (BID) | ORAL | Status: DC
  Administered 2022-08-07 – 2022-08-09 (×5): 237 mL via ORAL

## 2022-08-06 MED ORDER — HYDROCODONE-ACETAMINOPHEN 5-325 MG PO TABS
1.0000 | ORAL_TABLET | ORAL | Status: DC | PRN
  Administered 2022-08-06 – 2022-08-07 (×2): 1 via ORAL
  Filled 2022-08-06 (×2): qty 1

## 2022-08-06 MED ORDER — SIMVASTATIN 20 MG PO TABS
20.0000 mg | ORAL_TABLET | Freq: Every day | ORAL | Status: DC
  Administered 2022-08-06 – 2022-08-08 (×3): 20 mg via ORAL
  Filled 2022-08-06 (×3): qty 1

## 2022-08-06 MED ORDER — SERTRALINE HCL 100 MG PO TABS
100.0000 mg | ORAL_TABLET | Freq: Every day | ORAL | Status: DC
  Administered 2022-08-06 – 2022-08-08 (×3): 100 mg via ORAL
  Filled 2022-08-06 (×3): qty 1

## 2022-08-06 MED ORDER — METOPROLOL TARTRATE 12.5 MG HALF TABLET
12.5000 mg | ORAL_TABLET | Freq: Two times a day (BID) | ORAL | Status: DC
  Administered 2022-08-06 – 2022-08-09 (×7): 12.5 mg via ORAL
  Filled 2022-08-06 (×7): qty 1

## 2022-08-06 MED ORDER — POLYETHYLENE GLYCOL 3350 17 G PO PACK: 17.0000 g | PACK | Freq: Every day | ORAL | Status: DC | PRN

## 2022-08-06 MED ORDER — ASPIRIN 81 MG PO TBEC
81.0000 mg | DELAYED_RELEASE_TABLET | Freq: Every day | ORAL | Status: DC
  Administered 2022-08-06 – 2022-08-09 (×4): 81 mg via ORAL
  Filled 2022-08-06 (×4): qty 1

## 2022-08-06 MED ORDER — ONDANSETRON HCL 4 MG/2ML IJ SOLN
4.0000 mg | Freq: Four times a day (QID) | INTRAMUSCULAR | Status: DC | PRN
  Administered 2022-08-06: 4 mg via INTRAVENOUS
  Filled 2022-08-06: qty 2

## 2022-08-06 MED ORDER — APIXABAN 2.5 MG PO TABS
2.5000 mg | ORAL_TABLET | Freq: Two times a day (BID) | ORAL | Status: DC
  Administered 2022-08-06 – 2022-08-09 (×7): 2.5 mg via ORAL
  Filled 2022-08-06 (×8): qty 1

## 2022-08-06 MED ORDER — SODIUM CHLORIDE 0.9 % IV SOLN
1.0000 g | Freq: Once | INTRAVENOUS | Status: AC
  Administered 2022-08-06: 1 g via INTRAVENOUS
  Filled 2022-08-06: qty 10

## 2022-08-06 MED ORDER — MORPHINE SULFATE (PF) 2 MG/ML IV SOLN
2.0000 mg | INTRAVENOUS | Status: AC | PRN
  Administered 2022-08-06 – 2022-08-07 (×3): 2 mg via INTRAVENOUS
  Filled 2022-08-06 (×3): qty 1

## 2022-08-06 MED ORDER — MELATONIN 3 MG PO TABS
3.0000 mg | ORAL_TABLET | Freq: Every day | ORAL | Status: DC
  Administered 2022-08-06 – 2022-08-08 (×3): 3 mg via ORAL
  Filled 2022-08-06 (×3): qty 1

## 2022-08-06 MED ORDER — SODIUM CHLORIDE 0.9 % IV SOLN: INTRAVENOUS | Status: AC

## 2022-08-06 MED ORDER — SODIUM CHLORIDE 0.9% FLUSH
3.0000 mL | Freq: Two times a day (BID) | INTRAVENOUS | Status: DC
  Administered 2022-08-07 – 2022-08-09 (×5): 3 mL via INTRAVENOUS

## 2022-08-06 MED ORDER — MENTHOL (TOPICAL ANALGESIC) 5 % EX GEL: 1.0000 "application " | Freq: Three times a day (TID) | CUTANEOUS | Status: DC

## 2022-08-06 MED ORDER — MEMANTINE HCL 10 MG PO TABS
10.0000 mg | ORAL_TABLET | Freq: Two times a day (BID) | ORAL | Status: DC
  Administered 2022-08-06 – 2022-08-09 (×7): 10 mg via ORAL
  Filled 2022-08-06 (×8): qty 1

## 2022-08-06 MED ORDER — ONDANSETRON HCL 4 MG PO TABS: 4.0000 mg | ORAL_TABLET | Freq: Four times a day (QID) | ORAL | Status: DC | PRN

## 2022-08-06 MED ORDER — MUSCLE RUB 10-15 % EX CREA
TOPICAL_CREAM | Freq: Three times a day (TID) | CUTANEOUS | Status: DC
  Administered 2022-08-06: 1 via TOPICAL
  Filled 2022-08-06: qty 85

## 2022-08-06 MED ORDER — SENNOSIDES-DOCUSATE SODIUM 8.6-50 MG PO TABS
2.0000 | ORAL_TABLET | Freq: Every day | ORAL | Status: DC
  Administered 2022-08-06 – 2022-08-08 (×3): 2 via ORAL
  Filled 2022-08-06 (×3): qty 2

## 2022-08-06 MED ORDER — SODIUM CHLORIDE 0.9 % IV SOLN
1.0000 g | INTRAVENOUS | Status: DC
  Administered 2022-08-07 – 2022-08-08 (×2): 1 g via INTRAVENOUS
  Filled 2022-08-06 (×2): qty 10

## 2022-08-06 MED ORDER — ACETAMINOPHEN 650 MG RE SUPP: 650.0000 mg | Freq: Four times a day (QID) | RECTAL | Status: DC | PRN

## 2022-08-06 MED ORDER — LIDOCAINE 4 % EX CREA
2.0000 | TOPICAL_CREAM | Freq: Two times a day (BID) | CUTANEOUS | Status: DC
  Administered 2022-08-06 – 2022-08-09 (×6): 2 via TOPICAL
  Filled 2022-08-06: qty 10

## 2022-08-06 MED ORDER — ACETAMINOPHEN 325 MG PO TABS
650.0000 mg | ORAL_TABLET | Freq: Four times a day (QID) | ORAL | Status: DC | PRN
  Administered 2022-08-08 – 2022-08-09 (×2): 650 mg via ORAL
  Filled 2022-08-06 (×3): qty 2

## 2022-08-06 NOTE — ED Notes (Signed)
X-ray at bedside

## 2022-08-06 NOTE — ED Notes (Signed)
Trauma Response Nurse Documentation   [name] is a 88 y.o. female arriving to [facility] ED via EMS  On Eliquis (apixaban) daily. Trauma was activated as a Level 2 by ED charge RN based on the following trauma criteria Elderly patients > 65 with head trauma on anti-coagulation (excluding ASA). Trauma team at the bedside on patient arrival.   Patient cleared for CT by Dr. Ralene Bathe EDP. Pt transported to CT with trauma response nurse present to monitor. RN remained with the patient throughout their absence from the department for clinical observation.   GCS 14 ([id number]).  History   Past Medical History:  Diagnosis Date   Allergies    Alzheimer disease ([place])    STABLE   Anemia, iron deficiency 12/12/2011   Arthritis    RF   CKD (chronic kidney disease)    STAGE 3   Diabetes mellitus    no mes, diet only    Diarrhea    RESOLVED   Hypercholesterolemia    Hyperlipemia    Hypertension    Major depression    OA (osteoarthritis)    HAND/LEFT KNEE PAIN MILD   Osteoporosis    Stroke [facility])    Syncope      Past Surgical History:  Procedure Laterality Date   INTRAMEDULLARY (IM) NAIL INTERTROCHANTERIC Right 09/27/2021   Procedure: INTRAMEDULLARY (IM) NAIL INTERTROCHANTRIC;  Surgeon: Shona Needles, MD;  Location: [place];  Service: Orthopedics;  Laterality: Right;   left knee arthroscopy     ORIF HUMERUS FRACTURE  12/09/2011   Procedure: OPEN REDUCTION INTERNAL FIXATION (ORIF) DISTAL HUMERUS FRACTURE;  Surgeon: Linna Hoff, MD;  Location[facility];  Service: Orthopedics;  Laterality: Right;   OTHER SURGICAL HISTORY     left small toe bone spur removed    TONSILLECTOMY         Initial Focused Assessment (If applicable, or please see trauma documentation): Alert/confused female arrives via EMS from memory care facility, found down complaining of left shoulder pain and right wrist pain Airway unobstructed/patent BS clear No obvious uncontrolled hemorrhage noted GCS 14, PERRLA 43mm  sluggish   CT's Completed:   CT Head, CT Maxillofacial, and CT C-Spine   Interventions:  IV start and trauma lab draw CT head, maxface and c-spine Portable chest, pelvis, left shoulder, right wrist XRAY Fentanyl for pain management  EKG IV team consult  Plan for disposition:  Admission to floor anticipated  Consults completed:  Orthopaedic Surgeon anticipated  Event Summary: Patient arrives via EMS after a fall, found down at nursing facility. Hx dementia. Second fall this week, significant facial bruising with racoon eyes and battle sign. Significant tenderness to left shoulder. Difficult venous access.  MTP Summary (If applicable): NA  Bedside handoff with ED RN Wynell Balloon.    Jalen Oberry O Twanisha Foulk  Trauma Response RN  Please call TRN at [phone] for further assistance.

## 2022-08-06 NOTE — ED Notes (Signed)
RN assisted pt eating lunch tray and drinking Ice Tea

## 2022-08-06 NOTE — ED Notes (Signed)
Ortho at bedside.

## 2022-08-06 NOTE — Progress Notes (Signed)
Orthopedic Tech Progress Note Patient Details:  [name] 05-31-1932 [id number]  Sling was applied, waiting for further instruction from MD regarding splinting of the RUE.  Ortho Devices Type of Ortho Device: Sling immobilizer Ortho Device/Splint Location: LUE Ortho Device/Splint Interventions: Ordered, Application, Adjustment   Post Interventions Patient Tolerated: Fair Instructions Provided: Adjustment of device, Care of device  Arville Go 08/06/2022, 4:55 AM

## 2022-08-06 NOTE — ED Triage Notes (Signed)
BIB [facility] from [facility]. Dementia. Fall around 0030 found by staff. EMS arrived at 0200. On eliquis. Significant bruising to head/face- racoon eyes, battle sign, forehead. Right arm splinted, left shoulder pain. Disoriented upon arrival- oriented to self, place. No active bleeds visible.

## 2022-08-06 NOTE — ED Notes (Signed)
Lab added urine culture

## 2022-08-06 NOTE — Progress Notes (Signed)
Orthopedic Tech Progress Note Patient Details:  [name] May 14, 1932 [id number]  Ortho not needed as of now.  Patient ID: [name], female   DOB: 08-18-1932, 88 y.o.   MRN: [id number]  [name] 08/06/2022, 3:05 AM

## 2022-08-06 NOTE — Progress Notes (Signed)
Orthopedic Tech Progress Note Patient Details:  Elif Yonts Genther 02/23/1932 [id number]  [place] was adjusted to make sure IV was not covered by the ace bandages.  Ortho Devices Type of Ortho Device: [place] splint Ortho Device/Splint Location: RUE Ortho Device/Splint Interventions: Ordered, Application, Adjustment   Post Interventions Patient Tolerated: Fair Instructions Provided: Adjustment of device, Care of device  Arville Go 08/06/2022, 7:09 AM

## 2022-08-06 NOTE — ED Notes (Signed)
Spoke with nursing facility - daughter is aware of patient's presence in hospital, notified by nursing facility. ED staff has been unable to get in touch with her, goes straight to voicemail.

## 2022-08-06 NOTE — ED Provider Notes (Signed)
[facility]   CSN: [id number] Arrival date & time: 08/06/22  0230     History  Chief Complaint  Patient presents with   [name]    [name] is a 88 y.o. female.  The history is provided by the EMS personnel, the patient and medical records.   [name] is a 88 y.o. female who presents to the Emergency Department complaining of fall.  Level V caveat due to dementia.  Hx is provided by EMS.  EMS reports they were called out for unwittnessed fall from ALF.  Pt had been on the ground since around 1230 this morning.  Pt with complaints of severe left arm pain, right wrist pain.  Pt does not know what happened.  Takes Eliquis.     Home Medications Prior to Admission medications   Medication Sig Start Date End Date Taking? Authorizing Provider  acetaminophen (TYLENOL) 500 MG tablet Take 1,000 mg by mouth in the morning and at bedtime.   Yes [provider]  apixaban (ELIQUIS) 2.5 MG TABS tablet Take 1 tablet (2.5 mg total) by mouth 2 (two) times daily. 08/16/21  Yes Azucena FallenLancaster, William C, MD  aspirin 81 MG EC tablet Take 81 mg by mouth daily.   Yes [provider]  Calcium Carbonate Antacid (TUMS CHEWY BITES PO) Take 200 mg by mouth in the morning and at bedtime.   Yes [provider]  cyanocobalamin 1000 MCG tablet Take 1,000 mcg by mouth daily.   Yes [provider]  Ergocalciferol (VITAMIN D2 PO) Take 1,250 mcg by mouth once a week. monday   Yes [provider]  feeding supplement (ENSURE ENLIVE / ENSURE PLUS) LIQD Take 237 mLs by mouth 2 (two) times daily between meals. 09/30/21  Yes Dahal, Melina SchoolsBinaya, MD  ferrous gluconate (FERGON) 324 MG tablet Take 324 mg by mouth daily with breakfast.   Yes [provider]  Lidocaine HCl (ASPERCREME LIDOCAINE) 4 % CREA Apply 1 Application topically every 12 (twelve) hours.   Yes [provider]  Melatonin 3 MG TABS Take 3 mg by  mouth at bedtime.   Yes [provider]  memantine (NAMENDA) 10 MG tablet Take 10 mg by mouth 2 (two) times daily.   Yes [provider]  Menthol, Topical Analgesic, (BIOFREEZE PROFESSIONAL) 5 % GEL Apply 1 application topically in the morning, at noon, and at bedtime. Apply on right knee   Yes [provider]  metoprolol tartrate (LOPRESSOR) 25 MG tablet Take 0.5 tablets (12.5 mg total) by mouth 2 (two) times daily. 08/16/21  Yes Azucena FallenLancaster, William C, MD  polyethylene glycol (MIRALAX / GLYCOLAX) 17 g packet Take 17 g by mouth daily as needed for mild constipation. 09/30/21  Yes Dahal, Melina SchoolsBinaya, MD  senna-docusate (SENOKOT-S) 8.6-50 MG tablet Take 1 tablet by mouth 2 (two) times daily. Patient taking differently: Take 2 tablets by mouth at bedtime. 09/30/21  Yes Dahal, Melina SchoolsBinaya, MD  sertraline (ZOLOFT) 100 MG tablet Take 100 mg by mouth at bedtime.   Yes [provider]  simvastatin (ZOCOR) 20 MG tablet Take 1 tablet (20 mg total) by mouth daily at 6 PM. 08/19/21  Yes Azucena FallenLancaster, William C, MD  HYDROcodone-acetaminophen (NORCO/VICODIN) 5-325 MG tablet Take 1 tablet by mouth every 6 (six) hours as needed. Patient not taking: Reported on 08/06/2022 02/08/22   Linwood DibblesKnapp, Jon, MD      Allergies    Sulfa antibiotics and Latex    Review  of Systems   Review of Systems  All other systems reviewed and are negative.   Physical Exam Updated Vital Signs BP (!) 158/61   Pulse 65   Temp 98 F (36.7 C)   Resp 16   Ht 5\' 2"  (1.575 m)   Wt 68 kg   SpO2 99%   BMI 27.42 kg/m  Physical Exam Vitals and nursing note reviewed.  Constitutional:      Appearance: She is well-developed.  HENT:     Head: Normocephalic.     Comments: Extensive facial ecchymosis in multiple stages of healing. Right subconjunctival hematoma, EOMI Neck:     Comments: No midline cervical spine tenderness Cardiovascular:     Rate and Rhythm: Normal rate and regular rhythm.     Heart sounds: No murmur  heard. Pulmonary:     Effort: Pulmonary effort is normal. No respiratory distress.     Breath sounds: Normal breath sounds.  Chest:     Chest wall: No tenderness.  Abdominal:     Palpations: Abdomen is soft.     Tenderness: There is no abdominal tenderness. There is no guarding or rebound.  Musculoskeletal:        General: Swelling present. No tenderness.     Comments: 2+ radial and DP pulses bilaterally.  TTP and ecchymosis to left shoulder, ttp over left upper arm.  No tenderness over left elbow, forearm, wrist.  Unable to range LUE secondary to pain.  TTP over right wrist.  No tenderness over right shoulder, upper arm or forearm.  Wiggles digits in bilateral hands.    Skin:    General: Skin is warm and dry.  Neurological:     Mental Status: She is alert.     Comments: Oriented to place and person.  Disoriented to time and recent events.    Psychiatric:        Behavior: Behavior normal.     ED Results / Procedures / Treatments   Labs (all labs ordered are listed, but only abnormal results are displayed) Labs Reviewed  COMPREHENSIVE METABOLIC PANEL - Abnormal; Notable for the following components:      Result Value   Glucose, Bld 120 (*)    BUN 29 (*)    Creatinine, Ser 1.29 (*)    Total Protein 6.4 (*)    GFR, Estimated 40 (*)    All other components within normal limits  URINALYSIS, ROUTINE W REFLEX MICROSCOPIC - Abnormal; Notable for the following components:   APPearance HAZY (*)    Hgb urine dipstick SMALL (*)    Nitrite POSITIVE (*)    Leukocytes,Ua LARGE (*)    WBC, UA >50 (*)    Bacteria, UA MANY (*)    All other components within normal limits  PROTIME-INR - Abnormal; Notable for the following components:   Prothrombin Time 15.5 (*)    All other components within normal limits  CBC WITH DIFFERENTIAL/PLATELET - Abnormal; Notable for the following components:   WBC 12.1 (*)    Neutro Abs 8.9 (*)    All other components within normal limits  I-STAT CHEM 8, ED -  Abnormal; Notable for the following components:   BUN 34 (*)    Creatinine, Ser 1.20 (*)    Glucose, Bld 116 (*)    Calcium, Ion 1.09 (*)    All other components within normal limits  URINE CULTURE  CK  CBC WITH DIFFERENTIAL/PLATELET  CBC WITH DIFFERENTIAL/PLATELET  TYPE AND SCREEN  TROPONIN I (HIGH SENSITIVITY)  TROPONIN I (HIGH SENSITIVITY)    EKG EKG Interpretation  Date/Time:  Saturday August 06 2022 02:43:05 EDT Ventricular Rate:  63 PR Interval:  176 QRS Duration: 116 QT Interval:  446 QTC Calculation: 457 R Axis:   16 Text Interpretation: Sinus rhythm Incomplete left bundle branch block Low voltage, precordial leads Confirmed by Tilden Fossa [phone]) on 08/06/2022 4:22:08 AM  Radiology CT Head Wo Contrast  Result Date: 08/06/2022 CLINICAL DATA:  Facial trauma EXAM: CT HEAD WITHOUT CONTRAST CT MAXILLOFACIAL WITHOUT CONTRAST CT CERVICAL SPINE WITHOUT CONTRAST TECHNIQUE: Multidetector CT imaging of the head, cervical spine, and maxillofacial structures were performed using the standard protocol without intravenous contrast. Multiplanar CT image reconstructions of the cervical spine and maxillofacial structures were also generated. RADIATION DOSE REDUCTION: This exam was performed according to the departmental dose-optimization program which includes automated exposure control, adjustment of the mA and/or kV according to patient size and/or use of iterative reconstruction technique. COMPARISON:  08/03/2022 head CT FINDINGS: CT HEAD FINDINGS Brain: There is no mass, hemorrhage or extra-axial collection. There is generalized atrophy without lobar predilection. There is hypoattenuation of the periventricular white matter, most commonly indicating chronic ischemic microangiopathy. 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 No intracranial atherosclerosis. Skull: Large left frontal scalp  hematoma.  No skull fracture. CT MAXILLOFACIAL FINDINGS Osseous: --Complex facial fracture types: No LeFort, zygomaticomaxillary complex or nasoorbitoethmoidal fracture. --Simple fracture types: None. --Mandible: No fracture or dislocation. Orbits: The globes are intact. Normal appearance of the intra- and extraconal fat. Symmetric extraocular muscles and optic nerves. Sinuses: No fluid levels or advanced mucosal thickening. Soft tissues: Normal visualized extracranial soft tissues. CT CERVICAL SPINE FINDINGS Alignment: No static subluxation. Facets are aligned. Occipital condyles and the lateral masses of C1-C2 are aligned. Skull base and vertebrae: No acute fracture. Soft tissues and spinal canal: No prevertebral fluid or swelling. No visible canal hematoma. Disc levels: No advanced spinal canal or neural foraminal stenosis. Upper chest: No pneumothorax, pulmonary nodule or pleural effusion. Other: Normal visualized paraspinal cervical soft tissues. IMPRESSION: 1. No acute intracranial abnormality. 2. Large left frontal scalp hematoma without skull fracture or facial fracture. 3. No acute fracture or static subluxation of the cervical spine. Electronically Signed   By: Deatra Robinson M.D.   On: 08/06/2022 03:49   CT Cervical Spine Wo Contrast  Result Date: 08/06/2022 CLINICAL DATA:  Facial trauma EXAM: CT HEAD WITHOUT CONTRAST CT MAXILLOFACIAL WITHOUT CONTRAST CT CERVICAL SPINE WITHOUT CONTRAST TECHNIQUE: Multidetector CT imaging of the head, cervical spine, and maxillofacial structures were performed using the standard protocol without intravenous contrast. Multiplanar CT image reconstructions of the cervical spine and maxillofacial structures were also generated. RADIATION DOSE REDUCTION: This exam was performed according to the departmental dose-optimization program which includes automated exposure control, adjustment of the mA and/or kV according to patient size and/or use of iterative reconstruction  technique. COMPARISON:  08/03/2022 head CT FINDINGS: CT HEAD FINDINGS Brain: There is no mass, hemorrhage or extra-axial collection. There is generalized atrophy without lobar predilection. There is hypoattenuation of the periventricular white matter, most commonly indicating chronic ischemic microangiopathy. 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 No intracranial atherosclerosis. Skull: Large left frontal scalp hematoma.  No skull fracture. CT MAXILLOFACIAL FINDINGS Osseous: --Complex facial fracture types: No LeFort, zygomaticomaxillary complex or nasoorbitoethmoidal fracture. --Simple fracture types: None. --Mandible: No fracture or dislocation. Orbits: The  globes are intact. Normal appearance of the intra- and extraconal fat. Symmetric extraocular muscles and optic nerves. Sinuses: No fluid levels or advanced mucosal thickening. Soft tissues: Normal visualized extracranial soft tissues. CT CERVICAL SPINE FINDINGS Alignment: No static subluxation. Facets are aligned. Occipital condyles and the lateral masses of C1-C2 are aligned. Skull base and vertebrae: No acute fracture. Soft tissues and spinal canal: No prevertebral fluid or swelling. No visible canal hematoma. Disc levels: No advanced spinal canal or neural foraminal stenosis. Upper chest: No pneumothorax, pulmonary nodule or pleural effusion. Other: Normal visualized paraspinal cervical soft tissues. IMPRESSION: 1. No acute intracranial abnormality. 2. Large left frontal scalp hematoma without skull fracture or facial fracture. 3. No acute fracture or static subluxation of the cervical spine. Electronically Signed   By: Ulyses Jarred M.D.   On: 08/06/2022 03:49   CT Maxillofacial WO CM  Result Date: 08/06/2022 CLINICAL DATA:  Facial trauma EXAM: CT HEAD WITHOUT CONTRAST CT MAXILLOFACIAL WITHOUT CONTRAST CT CERVICAL SPINE WITHOUT CONTRAST TECHNIQUE:  Multidetector CT imaging of the head, cervical spine, and maxillofacial structures were performed using the standard protocol without intravenous contrast. Multiplanar CT image reconstructions of the cervical spine and maxillofacial structures were also generated. RADIATION DOSE REDUCTION: This exam was performed according to the departmental dose-optimization program which includes automated exposure control, adjustment of the mA and/or kV according to patient size and/or use of iterative reconstruction technique. COMPARISON:  08/03/2022 head CT FINDINGS: CT HEAD FINDINGS Brain: There is no mass, hemorrhage or extra-axial collection. There is generalized atrophy without lobar predilection. There is hypoattenuation of the periventricular white matter, most commonly indicating chronic ischemic microangiopathy. 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 No intracranial atherosclerosis. Skull: Large left frontal scalp hematoma.  No skull fracture. CT MAXILLOFACIAL FINDINGS Osseous: --Complex facial fracture types: No LeFort, zygomaticomaxillary complex or nasoorbitoethmoidal fracture. --Simple fracture types: None. --Mandible: No fracture or dislocation. Orbits: The globes are intact. Normal appearance of the intra- and extraconal fat. Symmetric extraocular muscles and optic nerves. Sinuses: No fluid levels or advanced mucosal thickening. Soft tissues: Normal visualized extracranial soft tissues. CT CERVICAL SPINE FINDINGS Alignment: No static subluxation. Facets are aligned. Occipital condyles and the lateral masses of C1-C2 are aligned. Skull base and vertebrae: No acute fracture. Soft tissues and spinal canal: No prevertebral fluid or swelling. No visible canal hematoma. Disc levels: No advanced spinal canal or neural foraminal stenosis. Upper chest: No pneumothorax, pulmonary nodule or pleural effusion. Other: Normal  visualized paraspinal cervical soft tissues. IMPRESSION: 1. No acute intracranial abnormality. 2. Large left frontal scalp hematoma without skull fracture or facial fracture. 3. No acute fracture or static subluxation of the cervical spine. Electronically Signed   By: Ulyses Jarred M.D.   On: 08/06/2022 03:49   DG Chest Port 1 View  Result Date: 08/06/2022 CLINICAL DATA:  Recent fall with chest pain, initial encounter EXAM: PORTABLE CHEST 1 VIEW COMPARISON:  02/08/2022 FINDINGS: Cardiac shadow is within normal limits. Lungs are well aerated bilaterally. No focal infiltrate or sizable effusion is seen. Left proximal humeral fracture is again noted. IMPRESSION: Proximal left humeral fracture. No acute intrathoracic abnormality. Electronically Signed   By: Inez Catalina M.D.   On: 08/06/2022 03:09   DG Pelvis Portable  Result Date: 08/06/2022 CLINICAL DATA:  Recent fall with pelvic pain, initial encounter EXAM: PORTABLE PELVIS 1-2 VIEWS COMPARISON:  08/03/2022 FINDINGS: Pelvic ring is intact. Prior fracture in the proximal right  femur is noted with surgical fixation. Old pubic rami fractures are noted and stable on the right. Proximal left femur appears within normal limits. Soft tissue abnormality is noted. IMPRESSION: Changes consistent with prior fractures on the right stable from the prior exam. No acute abnormality noted. Electronically Signed   By: Alcide Clever M.D.   On: 08/06/2022 03:08   DG Shoulder Left Portable  Result Date: 08/06/2022 CLINICAL DATA:  Recent fall with left shoulder pain, initial encounter EXAM: LEFT SHOULDER COMPARISON:  None Available. FINDINGS: There is a mildly impacted fracture involving the proximal left humerus primarily in the surgical neck with extension into the greater tuberosity. No dislocation is noted. No other fracture is seen. IMPRESSION: Proximal left humeral fracture as described. Electronically Signed   By: Alcide Clever M.D.   On: 08/06/2022 03:04   DG Wrist  Complete Right  Result Date: 08/06/2022 CLINICAL DATA:  Recent fall with right wrist pain, initial encounter EXAM: RIGHT WRIST - COMPLETE 3+ VIEW COMPARISON:  None Available. FINDINGS: There is a mildly impacted fracture in the distal right radial metaphysis. No significant displacement or angulation is noted. Distal ulna appears within normal limits. Visualized portions of the right hand are unremarkable. IMPRESSION: Mildly impacted distal right radial metaphyseal fracture. Electronically Signed   By: Alcide Clever M.D.   On: 08/06/2022 03:03    Procedures Procedures    Medications Ordered in ED Medications  fentaNYL (SUBLIMAZE) injection 50 mcg (50 mcg Intravenous Given 08/06/22 0359)  cefTRIAXone (ROCEPHIN) 1 g in sodium chloride 0.9 % 100 mL IVPB (0 g Intravenous Stopped 08/06/22 0743)    ED Course/ Medical Decision Making/ A&P                           Medical Decision Making Amount and/or Complexity of Data Reviewed Labs: ordered. Radiology: ordered.  Risk Prescription drug management. Decision regarding hospitalization.   Patient here as a level 2 trauma from a facility following unwitnessed fall.  She does have a history of dementia, walks at baseline.  Imaging is significant for left proximal humerus fracture as well as right distal radius fracture.  No evidence of serious closed head injury on imaging.  UA is consistent with UTI-we will send cultures and start antibiotics.  Attempted to contact patient's daughter-no answer when called.  In terms of her injuries the left humerus fracture can be treated with a sling.  She will need a sugar-tong splint for her right distal radius fracture.  Medicine consulted for admission for ongoing care given her multiple injuries as well as UTI and change in her weightbearing status.        Final Clinical Impression(s) / ED Diagnoses Final diagnoses:  Fall, initial encounter  Contusion of face, initial encounter  Closed fracture of  distal end of right radius, unspecified fracture morphology, initial encounter  Other closed nondisplaced fracture of proximal end of left humerus, initial encounter    Rx / DC Orders ED Discharge Orders     None         Tilden Fossa, MD 08/06/22 [phone]

## 2022-08-06 NOTE — Progress Notes (Signed)
   08/06/22 0217  Clinical Encounter Type  Visited With Patient not available;Health care provider  Visit Type ED;Trauma;Initial  Referral From Nurse Maebelle Munroe, RN)  Consult/Referral To Chaplain Melvenia Beam)  Recommendations Level 2 Trauma   Responded to page in [facility] [place]. E.D. Room 34 for Level 2 Trauma: "Patient [name] [place]." "Came from [place]." Ms. Monique Flitton. [name] is being evaluated and treated by medical staff at this time, patient not seen by Chaplain. No family present at this time. Spiritual Care Services not needed at this time. Staff will page Chaplain upon request of patient or family.  Chaplain Mahki Spikes, M.Min., [phone].

## 2022-08-06 NOTE — Plan of Care (Signed)
  Problem: Clinical Measurements: Goal: Diagnostic test results will improve Outcome: Completed/Met

## 2022-08-06 NOTE — ED Notes (Signed)
Pt O2 89% room air. Nasal canula applied 2L O2 96%

## 2022-08-06 NOTE — ED Notes (Signed)
Multiple attempts at IV access by this RN, primary RN and EDP without success. Ultrasound used without success. IV consult placed.  Delay in CT d/t venous access taking up time.

## 2022-08-06 NOTE — Progress Notes (Signed)
New Admission Note:  Arrival Method: Stretcher Mental Orientation: Alert  to self only Telemetry: Box 1 NSR Assessment: Completed Skin: Brusing to face , Knee caps, abrasions to feet , L knee and bruising to  Rt 5th toe. . Sling to left arm and cast to rt arm.  IV: NSL Pain: Hollers on and off when patient is turned.  Tubes: N/A Safety Measures: Safety Fall Prevention Plan initiated.  Admission: Completed 5 M  Orientation: Patient has been orientated to the room, unit and the staff. Welcome booklet given.   Family: N/A  Orders have been reviewed and implemented. Will continue to monitor the patient. Call light has been placed within reach and bed alarm has been activated.   Sima Matas BSN, RN  Phone Number: [phone]

## 2022-08-06 NOTE — H&P (Signed)
History and Physical    Patient: [name] KTG:[id number] DOB: June 08, 1932 DOA: 08/06/2022 DOS: the patient was seen and examined on 08/06/2022 PCP: Lupita Raider, MD  Patient coming from: [facility] memory care unit via EMS  Chief Complaint: Fall  HPI: [name] is a 88 y.o. female with medical history significant of hypertension, hyperlipidemia, PAF on Eliquis, CVA, diet controlled diabetes mellitus type 2, CKD stage IIIa, osteoarthritis, osteoporosis, anemia, and Alzheimer's dementia who presents after having a fall.  History is obtained from the patient as well as review of records.  She reports using a walker to ambulate if has to, but would rather not use it.  This morning she had gotten up to use the bathroom and does not recall using the walker.  She fell and hit her head, but is not exactly sure how she fell.  She complains of having severe left upper arm/shoulder pain and right wrist pain.  It was reported that the patient had been on the ground since 12:30 AM this morning.  EMS was called around 2 AM and splinted right arm.  Patient denied any shortness of breath, chest pain, abdominal pain, nausea, vomiting, diarrhea, dysuria, or urinary frequency symptoms.  However, when asked what family member to contact patient states her mother who is currently at work.  Over the phone patient daughter notes that the patient normally is able to carry on a conversation to make you think her dementia is mild.  She had just recently fallen 3 days ago and was seen in the emergency department where she was suspected to have rolled out of bed causing the left scalp hematoma.  CT imaging did not note any signs of any acute skull fracture at that time.   While in the emergency department patient was noted to have blood pressures elevated up to 183/73, and all other vital signs were within normal limits.   X-rays revealed mildly impacted distal right radial metaphyseal fracture, proximal left  humeral fracture, and stable old pubic rami fractures on the right. CT scan of the head, cervical spine, and maxillofacial noted a large left frontal scalp hematoma without acute fractures appreciated.  Left humerus fracture was treated with a sling and the right distal radius fracture placed in a sugar-tong splint.  Labs significant for WBC 12.1, BUN 29, and creatinine 1.29.  Urinalysis positive for large leukocytes, small hemoglobin, positive nitrites, many bacteria, 6-10 RBCs/hpf, and greater than 50 WBCs.  Patient has been given 50 mcg of fentanyl and started on Rocephin.   Review of Systems: As mentioned in the history of present illness. All other systems reviewed and are negative. Past Medical History:  Diagnosis Date   Allergies    Alzheimer disease (HCC)    STABLE   Anemia, iron deficiency 12/12/2011   Arthritis    RF   CKD (chronic kidney disease)    STAGE 3   Diabetes mellitus    no mes, diet only    Diarrhea    RESOLVED   Hypercholesterolemia    Hyperlipemia    Hypertension    Major depression    OA (osteoarthritis)    HAND/LEFT KNEE PAIN MILD   Osteoporosis    Stroke [facility])    Syncope    Past Surgical History:  Procedure Laterality Date   INTRAMEDULLARY (IM) NAIL INTERTROCHANTERIC Right 09/27/2021   Procedure: INTRAMEDULLARY (IM) NAIL INTERTROCHANTRIC;  Surgeon: Roby Lofts, MD;  Location: [facility] OR;  Service: Orthopedics;  Laterality: Right;   left  knee arthroscopy     ORIF HUMERUS FRACTURE  12/09/2011   Procedure: OPEN REDUCTION INTERNAL FIXATION (ORIF) DISTAL HUMERUS FRACTURE;  Surgeon: Linna Hoff, MD;  Location: [facility] ORS;  Service: Orthopedics;  Laterality: Right;   OTHER SURGICAL HISTORY     left small toe bone spur removed    TONSILLECTOMY     Social History:  reports that she quit smoking about 12 years ago. Her smoking use included cigarettes. She has never used smokeless tobacco. She reports that she does not drink alcohol and does not use  drugs.  Allergies  Allergen Reactions   Sulfa Antibiotics Hives, Diarrhea, Nausea And Vomiting and Swelling    Facial swelling ALSO   Latex Rash    NOT ON MAR    Family History  Problem Relation Age of Onset   Heart Problems Mother    Heart attack Maternal Grandmother        died from a "heart attack"   Stroke Neg Hx    Arrhythmia Neg Hx     Prior to Admission medications   Medication Sig Start Date End Date Taking? Authorizing Provider  acetaminophen (TYLENOL) 500 MG tablet Take 1,000 mg by mouth in the morning and at bedtime.   Yes [provider]  apixaban (ELIQUIS) 2.5 MG TABS tablet Take 1 tablet (2.5 mg total) by mouth 2 (two) times daily. 08/16/21  Yes Little Ishikawa, MD  aspirin 81 MG EC tablet Take 81 mg by mouth daily.   Yes [provider]  Calcium Carbonate Antacid (TUMS CHEWY BITES PO) Take 200 mg by mouth in the morning and at bedtime.   Yes [provider]  cyanocobalamin 1000 MCG tablet Take 1,000 mcg by mouth daily.   Yes [provider]  Ergocalciferol (VITAMIN D2 PO) Take 1,250 mcg by mouth once a week. monday   Yes [provider]  feeding supplement (ENSURE ENLIVE / ENSURE PLUS) LIQD Take 237 mLs by mouth 2 (two) times daily between meals. 09/30/21  Yes Dahal, Marlowe Aschoff, MD  ferrous gluconate (FERGON) 324 MG tablet Take 324 mg by mouth daily with breakfast.   Yes [provider]  Lidocaine HCl (ASPERCREME LIDOCAINE) 4 % CREA Apply 1 Application topically every 12 (twelve) hours.   Yes [provider]  Melatonin 3 MG TABS Take 3 mg by mouth at bedtime.   Yes [provider]  memantine (NAMENDA) 10 MG tablet Take 10 mg by mouth 2 (two) times daily.   Yes [provider]  Menthol, Topical Analgesic, (BIOFREEZE PROFESSIONAL) 5 % GEL Apply 1 application topically in the morning, at noon, and at bedtime. Apply on right knee   Yes [provider]  metoprolol tartrate (LOPRESSOR)  25 MG tablet Take 0.5 tablets (12.5 mg total) by mouth 2 (two) times daily. 08/16/21  Yes Little Ishikawa, MD  polyethylene glycol (MIRALAX / GLYCOLAX) 17 g packet Take 17 g by mouth daily as needed for mild constipation. 09/30/21  Yes Dahal, Marlowe Aschoff, MD  senna-docusate (SENOKOT-S) 8.6-50 MG tablet Take 1 tablet by mouth 2 (two) times daily. Patient taking differently: Take 2 tablets by mouth at bedtime. 09/30/21  Yes Dahal, Marlowe Aschoff, MD  sertraline (ZOLOFT) 100 MG tablet Take 100 mg by mouth at bedtime.   Yes [provider]  simvastatin (ZOCOR) 20 MG tablet Take 1 tablet (20 mg total) by mouth daily at 6 PM. 08/19/21  Yes Little Ishikawa, MD  HYDROcodone-acetaminophen (NORCO/VICODIN) 5-325 MG tablet Take 1 tablet  by mouth every 6 (six) hours as needed. Patient not taking: Reported on 08/06/2022 02/08/22   Dorie Rank, MD    Physical Exam: Vitals:   08/06/22 0415 08/06/22 0500 08/06/22 0530 08/06/22 0639  BP: (!) 177/63 (!) 183/73 (!) 158/61   Pulse: 70 74 65   Resp: 12 12 16    Temp:    98 F (36.7 C)  SpO2: 94% 99% 99%   Weight:      Height:       Exam  Constitutional: Elderly female currently in no acute distress Eyes: PERRL, bruising around bilateral eyes ENMT: Mucous membranes are moist.  Large hematoma noted of the left forehead.  Hard of hearing. Neck: normal, supple  Respiratory: clear to auscultation bilaterally, no wheezing, no crackles. Normal respiratory effort. No accessory muscle use.  Cardiovascular: Regular rate and rhythm, no murmurs / rubs / gallops. No extremity edema  Abdomen: no tenderness, no masses palpated.  Bowel sounds positive.  Musculoskeletal: no clubbing / cyanosis.  Cast of the right wrist and forearm and left arm in sling. Skin: Bruising noted of the head and face. Neurologic: CN 2-12 grossly intact. Sensation intact, DTR normal. Strength 5/5 in all 4.  Psychiatric: Patient is alert and oriented to person and place.  However thought it was  December 2023 and patient has for me to call her mother who she believes to be at work, but has been deceased for several years confirmed by the patient's daughter over the phone.  Data Reviewed:  EKG reveals sinus rhythm at 63 bpm with incomplete left bundle branch block.  Reviewed labs, imaging, and pertinent records as noted above.  Assessment and Plan: Distal right radial metaphyseal fracture, proximal left humerus fracture, scalp hematoma secondary to fall Acute.  Patient presents after having unwitnessed fall at nursing facility on 9/13 where she sustained the left frontal scalp hematoma.  After falling again this morning patient sustained the injuries to her right wrist and left arm.  X-rays revealed mildly impacted distal right radial metaphyseal fracture and proximal left humeral fracture humerus fracture was placed in a sling and sugar-tong splint placed for the distal radial fracture.  CT imaging of the head and neck noted a large scalp hematoma without any other acute fractures. -Admit to MedSurg bed -Hydrocodone/morphine as needed for moderate to severe pain respectively -PT/OT to evaluate and treat -Transitions of care  Urinary tract infection with hematuria Acute.  Urinalysis positive for large leukocytes, small hemoglobin, positive nitrites, many bacteria, 6-10 RBCs/hpf, and greater than 50 WBCs.  Patient had been started on empiric antibiotics of Rocephin. -Follow-up urine culture -Continue Rocephin.  Adjust antibiotics if needed based off culture  Leukocytosis Acute.  WBC elevated 12.1.  Suspect reactive in nature to the acute fractures, but could also been related to UTI as noted above. -Recheck CBC tomorrow morning  Hypertensive urgency Acute.  On admission blood pressures elevated up to 183/73.  Home blood pressure medication regimen includes metoprolol 12.5 mg twice daily.  Suspect an aspect of patient's elevated blood pressure related to pain -Continue  metoprolol -Hydralazine IV as needed for elevated blood pressures  Paroxysmal atrial fibrillation on chronic anticoagulation Patient currently appears to be in sinus rhythm.  Was noted to have new onset atrial fibrillation during hospitalization 07/2021 and had been placed on Eliquis at that time. CHA2DS2-VASc score =  to at least 7 based off age, sex, HTN, and DM history. HAS-BLED = 2-3. -Continue Eliquis.  Likely need to have discussions with  risks and benefits of continuing anticoagulation.  Renal insufficiency superimposed on chronic kidney disease stage IIIa Creatinine 1.29 with BUN 29 and CK within normal limits.  Based on creatinine previously noted to be around 1.02 earlier this year. This does not appear greater than 0.3 increased from baseline to suggest acute kidney injury at this time.  The Ella dated BUN to creatinine ratio suggesst some aspect of dehydration. -Normal saline IV fluids at 50 mL/h to complete 1 L -Recheck kidney function in a.m.  Heart failure with preserved ejection fraction Chronic.  Last EF was 60 to 65% with grade 1 diastolic dysfunction in 03/3663.  Patient appears to be euvolemic at this time on physical exam.  Controlled diabetes mellitus type 2 On admission glucose 129.  Patient not on any medications for treatment at baseline. -Continue heart healthy carb modified diet  Alzheimer's dementia Patient is alert and oriented to person and place currently, but is usually not oriented to time.  Daughter notes that she can hold a conversation and make you feel like her memory is only mildly affected. -Delirium precautions -Bed alarm on -Continue memantine  History of wide-complex tachycardia -Continue beta-blocker  Depression -Continue Zoloft  Hyperlipidemia -Continue simvastatin  Osteoporosis  DVT prophylaxis: Eliquis Advance Care Planning:   Code Status: DNR    Consults: None  Family Communication: Daughter updated over the phone  Severity of  Illness: The appropriate patient status for this patient is INPATIENT. Inpatient status is judged to be reasonable and necessary in order to provide the required intensity of service to ensure the patient's safety. The patient's presenting symptoms, physical exam findings, and initial radiographic and laboratory data in the context of their chronic comorbidities is felt to place them at high risk for further clinical deterioration. Furthermore, it is not anticipated that the patient will be medically stable for discharge from the hospital within 2 midnights of admission.   * I certify that at the point of admission it is my clinical judgment that the patient will require inpatient hospital care spanning beyond 2 midnights from the point of admission due to high intensity of service, high risk for further deterioration and high frequency of surveillance required.*  Author: Norval Morton, MD 08/06/2022 7:49 AM  For on call review [email or web address].

## 2022-08-07 DIAGNOSIS — S42295A Other nondisplaced fracture of upper end of left humerus, initial encounter for closed fracture: Secondary | ICD-10-CM | POA: Diagnosis present

## 2022-08-07 DIAGNOSIS — Z66 Do not resuscitate: Secondary | ICD-10-CM

## 2022-08-07 DIAGNOSIS — M810 Age-related osteoporosis without current pathological fracture: Secondary | ICD-10-CM | POA: Diagnosis present

## 2022-08-07 DIAGNOSIS — Z8673 Personal history of transient ischemic attack (TIA), and cerebral infarction without residual deficits: Secondary | ICD-10-CM | POA: Diagnosis not present

## 2022-08-07 DIAGNOSIS — Z87891 Personal history of nicotine dependence: Secondary | ICD-10-CM | POA: Diagnosis not present

## 2022-08-07 DIAGNOSIS — Z9181 History of falling: Secondary | ICD-10-CM | POA: Diagnosis not present

## 2022-08-07 DIAGNOSIS — F0283 Dementia in other diseases classified elsewhere, unspecified severity, with mood disturbance: Secondary | ICD-10-CM | POA: Diagnosis present

## 2022-08-07 DIAGNOSIS — F0390 Unspecified dementia without behavioral disturbance: Secondary | ICD-10-CM

## 2022-08-07 DIAGNOSIS — G309 Alzheimer's disease, unspecified: Secondary | ICD-10-CM | POA: Diagnosis present

## 2022-08-07 DIAGNOSIS — E7800 Pure hypercholesterolemia, unspecified: Secondary | ICD-10-CM | POA: Diagnosis present

## 2022-08-07 DIAGNOSIS — Z79899 Other long term (current) drug therapy: Secondary | ICD-10-CM | POA: Diagnosis not present

## 2022-08-07 DIAGNOSIS — S52501A Unspecified fracture of the lower end of right radius, initial encounter for closed fracture: Secondary | ICD-10-CM | POA: Diagnosis present

## 2022-08-07 DIAGNOSIS — N3000 Acute cystitis without hematuria: Secondary | ICD-10-CM | POA: Diagnosis present

## 2022-08-07 DIAGNOSIS — E1122 Type 2 diabetes mellitus with diabetic chronic kidney disease: Secondary | ICD-10-CM | POA: Diagnosis present

## 2022-08-07 DIAGNOSIS — N1831 Chronic kidney disease, stage 3a: Secondary | ICD-10-CM | POA: Diagnosis present

## 2022-08-07 DIAGNOSIS — I10 Essential (primary) hypertension: Secondary | ICD-10-CM

## 2022-08-07 DIAGNOSIS — W19XXXA Unspecified fall, initial encounter: Secondary | ICD-10-CM | POA: Diagnosis present

## 2022-08-07 DIAGNOSIS — F32A Depression, unspecified: Secondary | ICD-10-CM | POA: Diagnosis present

## 2022-08-07 DIAGNOSIS — Z7901 Long term (current) use of anticoagulants: Secondary | ICD-10-CM | POA: Diagnosis not present

## 2022-08-07 DIAGNOSIS — I160 Hypertensive urgency: Secondary | ICD-10-CM | POA: Diagnosis present

## 2022-08-07 DIAGNOSIS — I480 Paroxysmal atrial fibrillation: Secondary | ICD-10-CM | POA: Diagnosis present

## 2022-08-07 DIAGNOSIS — R000 Tachycardia, unspecified: Secondary | ICD-10-CM | POA: Diagnosis not present

## 2022-08-07 DIAGNOSIS — E860 Dehydration: Secondary | ICD-10-CM | POA: Diagnosis present

## 2022-08-07 DIAGNOSIS — I5032 Chronic diastolic (congestive) heart failure: Secondary | ICD-10-CM | POA: Diagnosis present

## 2022-08-07 DIAGNOSIS — N3001 Acute cystitis with hematuria: Secondary | ICD-10-CM | POA: Diagnosis present

## 2022-08-07 DIAGNOSIS — Y92129 Unspecified place in nursing home as the place of occurrence of the external cause: Secondary | ICD-10-CM | POA: Diagnosis not present

## 2022-08-07 DIAGNOSIS — S0511XA Contusion of eyeball and orbital tissues, right eye, initial encounter: Secondary | ICD-10-CM | POA: Diagnosis present

## 2022-08-07 DIAGNOSIS — S32591D Other specified fracture of right pubis, subsequent encounter for fracture with routine healing: Secondary | ICD-10-CM | POA: Diagnosis not present

## 2022-08-07 DIAGNOSIS — B961 Klebsiella pneumoniae [K. pneumoniae] as the cause of diseases classified elsewhere: Secondary | ICD-10-CM | POA: Diagnosis present

## 2022-08-07 DIAGNOSIS — I130 Hypertensive heart and chronic kidney disease with heart failure and stage 1 through stage 4 chronic kidney disease, or unspecified chronic kidney disease: Secondary | ICD-10-CM | POA: Diagnosis present

## 2022-08-07 LAB — BASIC METABOLIC PANEL
Anion gap: 7 (ref 5–15)
BUN: 22 mg/dL (ref 8–23)
CO2: 23 mmol/L (ref 22–32)
Calcium: 8.5 mg/dL — ABNORMAL LOW (ref 8.9–10.3)
Chloride: 106 mmol/L (ref 98–111)
Creatinine, Ser: 1.11 mg/dL — ABNORMAL HIGH (ref 0.44–1.00)
GFR, Estimated: 48 mL/min — ABNORMAL LOW (ref >60)
Glucose, Bld: 115 mg/dL — ABNORMAL HIGH (ref 70–99)
Potassium: 4.5 mmol/L (ref 3.5–5.1)
Sodium: 136 mmol/L (ref 135–145)

## 2022-08-07 LAB — MAGNESIUM: Magnesium: 1.7 mg/dL (ref 1.7–2.4)

## 2022-08-07 LAB — CBC
HCT: 34.1 % — ABNORMAL LOW (ref 36.0–46.0)
Hemoglobin: 10.8 g/dL — ABNORMAL LOW (ref 12.0–15.0)
MCH: 30.1 pg (ref 26.0–34.0)
MCHC: 31.7 g/dL (ref 30.0–36.0)
MCV: 95 fL (ref 80.0–100.0)
Platelets: 206 10*3/uL (ref 150–400)
RBC: 3.59 MIL/uL — ABNORMAL LOW (ref 3.87–5.11)
RDW: 12.5 % (ref 11.5–15.5)
WBC: 9.6 10*3/uL (ref 4.0–10.5)
nRBC: 0 % (ref 0.0–0.2)

## 2022-08-07 MED ORDER — HYDROCODONE-ACETAMINOPHEN 5-325 MG PO TABS
1.0000 | ORAL_TABLET | Freq: Three times a day (TID) | ORAL | Status: DC | PRN
  Administered 2022-08-07: 1 via ORAL
  Filled 2022-08-07: qty 1

## 2022-08-07 MED ORDER — SODIUM CHLORIDE 0.9 % IV SOLN: INTRAVENOUS | Status: DC

## 2022-08-07 MED ORDER — ORAL CARE MOUTH RINSE: 15.0000 mL | OROMUCOSAL | Status: DC | PRN

## 2022-08-07 NOTE — Evaluation (Signed)
Physical Therapy Evaluation Patient Details Name: [name] MRN: [id number] DOB: [birth year] Today's Date: 08/07/2022  History of Present Illness  Pt is an 88 y.o. female who presented 08/06/22 s/p fall in which she sustained a scalp hematoma, mildly impacted distal right radial metaphyseal fracture, and proximal left humeral fracture. Pt also found to have UTI and acute hypertensive urgency. PMH includes CKD, Alzheimer's, HLD, HTN, osteoporosis, DM, OA, CVA, PAF   Clinical Impression  Pt presents with condition above and deficits mentioned below, see PT Problem List. Per chart, pt from SNF. Pt reports she was ambulating without an AD at baseline. Pt very limited by pain this session, calling out in pain repeatedly with any movement and light touch even though pre-medicated. Pt only oriented to person with poor problem-solving and delayed processing. She required maxAx2 for all bed mobility and to stand and take a few side steps along EOB today. She demonstrates deficits in balance, cognition, activity tolerance, ROM, strength, and power and is at high risk for subsequent falls. Recommending SNF at d/c. Will continue to follow acutely.       Recommendations for follow up therapy are one component of a multi-disciplinary discharge planning process, led by the attending physician.  Recommendations may be updated based on patient status, additional functional criteria and insurance authorization.  Follow Up Recommendations Skilled nursing-short term rehab (<3 hours/day) Can patient physically be transported by private vehicle: No    Assistance Recommended at Discharge Frequent or constant Supervision/Assistance  Patient can return home with the following  Two people to help with walking and/or transfers;A lot of help with bathing/dressing/bathroom;Assistance with cooking/housework;Direct supervision/assist for medications management;Direct supervision/assist for financial management;Assist for  transportation;Help with stairs or ramp for entrance    Equipment Recommendations Other (comment) (defer to next venue of care)  Recommendations for Other Services       Functional Status Assessment Patient has had a recent decline in their functional status and demonstrates the ability to make significant improvements in function in a reasonable and predictable amount of time.     Precautions / Restrictions Precautions Precautions: Fall Required Braces or Orthoses: Sling;Splint/Cast Splint/Cast: sugar tong splint on RUE, sling LUE Restrictions Weight Bearing Restrictions: Yes RUE Weight Bearing: Weight bear through elbow only LUE Weight Bearing: Non weight bearing Other Position/Activity Restrictions: WB orders per Dr. Alanda Slim 9/17      Mobility  Bed Mobility Overal bed mobility: Needs Assistance Bed Mobility: Sit to Supine, Supine to Sit     Supine to sit: Max assist, +2 for physical assistance Sit to supine: Max assist, +2 for physical assistance   General bed mobility comments: use of pad for helicopter technique, minimal initiation by pt to assist other than move her legs towards EOB and then begin descent of trunk with return to supine.    Transfers Overall transfer level: Needs assistance Equipment used: 2 person hand held assist Transfers: Sit to/from Stand Sit to Stand: Max assist, +2 physical assistance           General transfer comment: pt bracing legs against back of bed, side stepping and use of paid to support pt's hips to keep upright    Ambulation/Gait Ambulation/Gait assistance: Max assist, +2 physical assistance, +2 safety/equipment Gait Distance (Feet): 2 Feet Assistive device: 1 person hand held assist Gait Pattern/deviations: Step-to pattern, Decreased step length - right, Decreased step length - left, Decreased stride length, Leaning posteriorly, Trunk flexed, Shuffle Gait velocity: reduced Gait velocity interpretation: <1.31 ft/sec,  indicative  of household ambulator   General Gait Details: Pt with small, shuffling lateral steps to R to [facility] with use of bed pad to support pt and extend hips. Support provided under R elbow also. Pt needing assistance to slide feet to step. Strong posterior lean and support on bed.  Stairs            Wheelchair Mobility    Modified Rankin (Stroke Patients Only)       Balance Overall balance assessment: Needs assistance Sitting-balance support: Feet supported Sitting balance-Leahy Scale: Poor Sitting balance - Comments: sits EOB with min guard-min A Postural control: Posterior lean Standing balance support: During functional activity Standing balance-Leahy Scale: Poor Standing balance comment: bracing legs against back of bed for support                             Pertinent Vitals/Pain Pain Assessment Pain Assessment: Faces Faces Pain Scale: Hurts whole lot Pain Location: LUE/RUE/back with movement Pain Descriptors / Indicators: Discomfort, Grimacing, Crying Pain Intervention(s): Limited activity within patient's tolerance, Monitored during session, Repositioned, Premedicated before session    Home Living Family/patient expects to be discharged to:: Skilled nursing facility                   Additional Comments: does not wear O2 at home    Prior Function Prior Level of Function : Patient poor historian/Family not available             Mobility Comments: reports not using RW ADLs Comments: assuming assist is needed, will need to verify     Hand Dominance        Extremity/Trunk Assessment   Upper Extremity Assessment Upper Extremity Assessment: Defer to OT evaluation RUE Deficits / Details: minimally able to move digits, reports sensation, AAROM WNL for shoulder, however difficulty to report as pt wtih difficulty command following and yelling in pain when touched/not touched RUE Sensation: decreased light touch;decreased  proprioception RUE Coordination: decreased fine motor;decreased gross motor LUE Deficits / Details: immobilized in sling, moves digits minimally, difficulty following commands LUE: Unable to fully assess due to immobilization;Unable to fully assess due to pain LUE Sensation: decreased light touch;decreased proprioception LUE Coordination: decreased fine motor;decreased gross motor    Lower Extremity Assessment Lower Extremity Assessment: RLE deficits/detail;LLE deficits/detail;Generalized weakness RLE Deficits / Details: noted 5th digit erythema/bruising and edema but pt denied pain there, yet painful with light touch everywhere intermittently throughout session; able to lift leg off bed but generalized weakness noted LLE Deficits / Details: painful with light touch everywhere intermittently throughout session; able to lift leg off bed but generalized weakness noted    Cervical / Trunk Assessment Cervical / Trunk Assessment: Kyphotic  Communication   Communication: HOH  Cognition Arousal/Alertness: Awake/alert Behavior During Therapy: WFL for tasks assessed/performed Overall Cognitive Status: History of cognitive impairments - at baseline                                 General Comments: hx of dementia, oriented to self and DOB at baseline        General Comments General comments (skin integrity, edema, etc.): SpO2 96% on RA seated EOB    Exercises     Assessment/Plan    PT Assessment Patient needs continued PT services  PT Problem List Decreased strength;Decreased range of motion;Decreased activity tolerance;Decreased balance;Decreased mobility;Decreased coordination;Decreased cognition;Decreased knowledge of  use of DME;Decreased safety awareness;Pain       PT Treatment Interventions DME instruction;Gait training;Functional mobility training;Therapeutic activities;Balance training;Therapeutic exercise;Cognitive remediation;Neuromuscular  re-education;Patient/family education;Wheelchair mobility training    PT Goals (Current goals can be found in the Care Plan section)  Acute Rehab PT Goals Patient Stated Goal: to lay down end of session PT Goal Formulation: With patient Time For Goal Achievement: 08/21/22 Potential to Achieve Goals: Fair    Frequency Min 2X/week     Co-evaluation PT/OT/SLP Co-Evaluation/Treatment: Yes Reason for Co-Treatment: Necessary to address cognition/behavior during functional activity;For patient/therapist safety;To address functional/ADL transfers PT goals addressed during session: Mobility/safety with mobility;Balance OT goals addressed during session: ADL's and self-care;Strengthening/ROM       AM-PAC PT "6 Clicks" Mobility  Outcome Measure Help needed turning from your back to your side while in a flat bed without using bedrails?: A Lot Help needed moving from lying on your back to sitting on the side of a flat bed without using bedrails?: Total Help needed moving to and from a bed to a chair (including a wheelchair)?: Total Help needed standing up from a chair using your arms (e.g., wheelchair or bedside chair)?: Total Help needed to walk in hospital room?: Total Help needed climbing 3-5 steps with a railing? : Total 6 Click Score: 7    End of Session   Activity Tolerance: Patient limited by pain Patient left: in bed;with call bell/phone within reach;with bed alarm set   PT Visit Diagnosis: Unsteadiness on feet (R26.81);Other abnormalities of gait and mobility (R26.89);Muscle weakness (generalized) (M62.81);History of falling (Z91.81);Difficulty in walking, not elsewhere classified (R26.2);Pain Pain - part of body: Arm;Leg    Time: 1610-9604 PT Time Calculation (min) (ACUTE ONLY): 17 min   Charges:   PT Evaluation $PT Eval Moderate Complexity: 1 Mod          Moishe Spice, PT, DPT Acute Rehabilitation Services  Office: [phone]   Orvan Falconer 08/07/2022, 9:47  AM

## 2022-08-07 NOTE — Evaluation (Signed)
Occupational Therapy Evaluation Patient Details Name: [name] MRN: [id number] DOB: Sep 29, 1932 Today's Date: 08/07/2022   History of Present Illness Pt is an 88 y.o. female who presented 08/06/22 s/p fall in which she sustained a scalp hematoma, mildly impacted distal right radial metaphyseal fracture, and proximal left humeral fracture. Pt also found to have UTI and acute hypertensive urgency. PMH includes CKD, Alzheimer's, HLD, HTN, osteoporosis, DM, OA, CVA, PAF   Clinical Impression   Pt poor historian, unable to provide clear baseline; from SNF reporting she does not use AD for mobility. Pt currently yelling in pain with any movement, needing max -total A for ADLs, max A +2 for bed mobility and transfers. Pt bracing legs against back of bed for support in standing/side stepping. Pt with questionable visual deficits however unable to provide report, continues to state " I don't know". Pt presenting with impairments listed below, will follow acutely. Recommend SNF at d/c.      Recommendations for follow up therapy are one component of a multi-disciplinary discharge planning process, led by the attending physician.  Recommendations may be updated based on patient status, additional functional criteria and insurance authorization.   Follow Up Recommendations  Skilled nursing-short term rehab (<3 hours/day)    Assistance Recommended at Discharge Frequent or constant Supervision/Assistance  Patient can return home with the following Two people to help with walking and/or transfers;A lot of help with bathing/dressing/bathroom;Assistance with cooking/housework;Assistance with feeding;Direct supervision/assist for financial management;Direct supervision/assist for medications management;Help with stairs or ramp for entrance;Assist for transportation    Functional Status Assessment  Patient has had a recent decline in their functional status and demonstrates the ability to make significant  improvements in function in a reasonable and predictable amount of time.  Equipment Recommendations  None recommended by OT;Other (comment) (defer)    Recommendations for Other Services PT consult     Precautions / Restrictions Precautions Precautions: Fall Required Braces or Orthoses: Sling;Splint/Cast Splint/Cast: sugar tong splint on RUE, sling LUE Restrictions Weight Bearing Restrictions: Yes RUE Weight Bearing: Weight bear through elbow only LUE Weight Bearing: Non weight bearing Other Position/Activity Restrictions: WB orders per Dr. Cyndia Skeeters 9/17      Mobility Bed Mobility Overal bed mobility: Needs Assistance Bed Mobility: Sit to Supine, Supine to Sit     Supine to sit: Max assist, +2 for physical assistance Sit to supine: Max assist, +2 for physical assistance   General bed mobility comments: use of pad for helicopter technique    Transfers Overall transfer level: Needs assistance Equipment used: 2 person hand held assist Transfers: Sit to/from Stand Sit to Stand: Max assist, +2 physical assistance           General transfer comment: pt bracing legs against back of bed, side stepping and use of paid to support pt's hips to keep upright      Balance Overall balance assessment: Needs assistance Sitting-balance support: Feet supported Sitting balance-Leahy Scale: Poor Sitting balance - Comments: sits EOB with min guard-min A Postural control: Posterior lean Standing balance support: During functional activity Standing balance-Leahy Scale: Poor Standing balance comment: bracing legs against back of bed for support                           ADL either performed or assessed with clinical judgement   ADL Overall ADL's : Needs assistance/impaired Eating/Feeding: Maximal assistance   Grooming: Maximal assistance   Upper Body Bathing: Maximal assistance   Lower  Body Bathing: Total assistance   Upper Body Dressing : Maximal assistance   Lower  Body Dressing: Total assistance   Toilet Transfer: Maximal assistance;+2 for physical assistance   Toileting- Clothing Manipulation and Hygiene: Total assistance       Functional mobility during ADLs: Maximal assistance;+2 for safety/equipment       Vision   Vision Assessment?: Vision impaired- to be further tested in functional context Additional Comments: pt stating she sees "rocks" outside the window in reference to brick building, pt with bruising around both eyes R > L, unable to report visual changes; will further assess     Perception     Praxis      Pertinent Vitals/Pain Pain Assessment Pain Assessment: Faces Pain Score: 8  Faces Pain Scale: Hurts whole lot Pain Location: LUE/RUE/back with movement Pain Descriptors / Indicators: Discomfort, Grimacing, Crying Pain Intervention(s): Limited activity within patient's tolerance, Monitored during session, Repositioned     Hand Dominance     Extremity/Trunk Assessment Upper Extremity Assessment Upper Extremity Assessment: RUE deficits/detail;LUE deficits/detail RUE Deficits / Details: minimally able to move digits, reports sensation, AAROM WNL for shoulder, however difficulty to report as pt wtih difficulty command following and yelling in pain when touched/not touched RUE Sensation: decreased light touch;decreased proprioception RUE Coordination: decreased fine motor;decreased gross motor LUE Deficits / Details: immobilized in sling, moves digits minimally, difficulty following commands LUE: Unable to fully assess due to immobilization;Unable to fully assess due to pain LUE Sensation: decreased light touch;decreased proprioception LUE Coordination: decreased fine motor;decreased gross motor   Lower Extremity Assessment Lower Extremity Assessment: Defer to PT evaluation   Cervical / Trunk Assessment Cervical / Trunk Assessment: Kyphotic   Communication Communication Communication: HOH   Cognition  Arousal/Alertness: Awake/alert Behavior During Therapy: WFL for tasks assessed/performed Overall Cognitive Status: History of cognitive impairments - at baseline                                 General Comments: hx of dementia, oriented to self and DOB at baseline     General Comments  SpO2 96% on RA seated EOB    Exercises     Shoulder Instructions      Home Living Family/patient expects to be discharged to:: Skilled nursing facility                                 Additional Comments: does not wear O2 at home      Prior Functioning/Environment Prior Level of Function : Patient poor historian/Family not available             Mobility Comments: reports not using RW ADLs Comments: assuming assist is needed, will need to verify        OT Problem List: Decreased strength;Decreased range of motion;Decreased activity tolerance;Impaired balance (sitting and/or standing);Decreased coordination;Decreased cognition;Decreased safety awareness;Decreased knowledge of use of DME or AE;Decreased knowledge of precautions;Impaired UE functional use;Impaired sensation;Pain      OT Treatment/Interventions: Self-care/ADL training;Therapeutic exercise;DME and/or AE instruction;Energy conservation;Therapeutic activities;Visual/perceptual remediation/compensation;Patient/family education;Balance training    OT Goals(Current goals can be found in the care plan section) Acute Rehab OT Goals Patient Stated Goal: to get back to bed OT Goal Formulation: With patient Time For Goal Achievement: 08/21/22 Potential to Achieve Goals: Fair ADL Goals Pt Will Perform Eating: with min assist;sitting;bed level Pt Will Perform Grooming: with min assist;bed  level;sitting Pt Will Perform Upper Body Dressing: with mod assist;with caregiver independent in assisting;sitting;bed level Additional ADL Goal #1: pt will perform bed mobility with mod A in prep forADLs  OT Frequency: Min  2X/week    Co-evaluation PT/OT/SLP Co-Evaluation/Treatment: Yes Reason for Co-Treatment: For patient/therapist safety;To address functional/ADL transfers   OT goals addressed during session: ADL's and self-care;Strengthening/ROM      AM-PAC OT "6 Clicks" Daily Activity     Outcome Measure Help from another person eating meals?: A Lot Help from another person taking care of personal grooming?: A Lot Help from another person toileting, which includes using toliet, bedpan, or urinal?: Total Help from another person bathing (including washing, rinsing, drying)?: A Lot Help from another person to put on and taking off regular upper body clothing?: A Lot Help from another person to put on and taking off regular lower body clothing?: Total 6 Click Score: 10   End of Session Nurse Communication: Mobility status  Activity Tolerance: Patient tolerated treatment well Patient left: in bed;with call bell/phone within reach;with bed alarm set  OT Visit Diagnosis: Unsteadiness on feet (R26.81);Other abnormalities of gait and mobility (R26.89);History of falling (Z91.81);Muscle weakness (generalized) (M62.81);Repeated falls (R29.6)                Time: [id number] OT Time Calculation (min): 18 min Charges:  OT General Charges $OT Visit: 1 Visit OT Evaluation $OT Eval Moderate Complexity: 1 [address], OTD, OTR/L Acute Rehab [phone] - [place] 08/07/2022, 9:01 AM

## 2022-08-07 NOTE — Progress Notes (Signed)
PROGRESS NOTE  [name] [id number] DOB: January 07, 1932   PCP: Lupita RaiderShaw, Kimberlee, MD  Patient is from: ALF.  Uses rolling walker but not consistent.  DOA: 08/06/2022 LOS: 0  Chief complaints Chief Complaint  Patient presents with   Fall     Brief Narrative / Interim history: 88 year old F with PMH of Alzheimer's dementia, CVA, diastolic CHF, paroxysmal A-fib on Eliquis, DM-2, CKD-3A, osteoarthritis, osteoporosis and anemia brought to ED from ALF after she fell and broke her arms.  Reportedly hit her head and had severe left upper arm, shoulder and right wrist pain.  Imaging showed acute distal right radial, acute left humeral and old pubic rami fracture.  Head, cervical spine and maxillofacial CT showed large left frontal scalp hematoma without acute fracture or intracranial process.  Patient had sling for left humeral fracture and sugar-tong splint for right distal radial fracture.  UA concerning for UTI.  Cultures obtained.  She was started on IV antibiotics and admitted for acute cystitis with hematuria, and hypertensive urgency.  Subjective: Seen and examined earlier this morning.  No major events overnight or this morning.  Patient is sleepy but wakes to voice.  She is oriented to self.  [facility] but no insight into why she is in the hospital.  She denies pain.   Objective: Vitals:   08/07/22 0114 08/07/22 0511 08/07/22 0804 08/07/22 0924  BP: 138/71 (!) 133/54 (!) 157/62 (!) 122/55  Pulse: 68 65 63 64  Resp: 16 17 18 19   Temp: 98.4 F (36.9 C) 98.5 F (36.9 C) 98.7 F (37.1 C) 98 F (36.7 C)  TempSrc: Oral Oral Oral   SpO2: 98% 100% 99% 99%  Weight:      Height:        Examination:  GENERAL: No apparent distress.  Nontoxic. HEENT: MMM.  Vision and hearing grossly intact.  Facial bruise NECK: Supple.  No apparent JVD.  RESP:  No IWOB.  Fair aeration bilaterally. CVS:  RRR. Heart sounds normal.  ABD/GI/GU: BS+. Abd soft, NTND.   MSK/EXT:  Moves extremities.  Left upper extremity in sling.  Right forearm in sugar-tong splint.  Neurovascular intact. SKIN: Facial bruises bilaterally NEURO: Sleepy but wakes to voice.  Oriented to self and partial place.  Follows commands.  No insight into why she is in the hospital.  No apparent focal neuro deficit. PSYCH: Calm. Normal affect.   Procedures:  None  Microbiology summarized: MRSA PCR screen nonreactive. Wound culture with > 1000 colonies of GNR  Assessment and plan: Principal Problem:   Acute cystitis without hematuria Active Problems:   Fracture of proximal end of left humerus   Scalp hematoma   Fall   Distal radial fracture   Leukocytosis   Hypertensive urgency   Paroxysmal atrial fibrillation (HCC)   Chronic anticoagulation   Acute on chronic renal insufficiency   (HFpEF) heart failure with preserved ejection fraction (HCC)   DM II (diabetes mellitus, type II), controlled (HCC)   Wide-complex tachycardia   Hyperlipidemia  Acute cystitis without hematuria: Based on urinalysis and urine culture since patient is not a reliable historian.  She also have leukocytosis but no fever.  Hemodynamically stable.  Urine culture with GNR. -Continue ceftriaxone pending culture speciation and sensitivity.  Accidental fall at ALF: Likely from not using her walker. Acute distal right radial metaphyseal fracture can [place] to fall Acute proximal left humerus fracture secondary to fall Acute scalp hematoma secondary to fall -Ortho-nonoperative with  splint for RUE, sling to LUE, WBAT through elbow on RUE & NWB for LUE.  -Fall precaution -Pain control -PT/OT   Uncontrolled hypertension: BP elevated to 183/73 on arrival.  This could be pain related.  Normotensive -Continue metoprolol -Hydralazine IV as needed for elevated blood pressures   Paroxysmal A-fib on Eliquis: CHA2DS2-VASc score at least 6.  However, patient is high risk for fall.  -Continue Eliquis in house.   Will discuss with family about long-term use.  She is also on aspirin. -Continue metoprolol for rate control   CKD-3A: Creatinine slightly higher than baseline but improved.  CK normal. Recent Labs    09/24/21 1104 09/26/21 0742 09/28/21 0518 09/29/21 0213 09/30/21 0209 11/08/21 1258 02/08/22 2005 08/06/22 0247 08/06/22 0400 08/07/22 1029  BUN 9 12 21 19 17 12 21  34* 29* 22  CREATININE 1.01* 0.96 1.28* 1.07* 0.83 1.05* 1.02* 1.20* 1.29* 1.11*  -Continue monitoring -Avoid nephrotoxic meds -Continue IV fluid   Diastolic CHF: TTE in 07/2021 with LVEF of 60 to 65% and G1 DD.  Appears euvolemic on exam.  Not on diuretics at home. -Monitor fluid status   Controlled NIDDM-2: A1c 5.5% in 07/2021.  Does not seem to be on medication at home.  Alzheimer's dementia without behavioral disturbance:  Per admitting provider, daughter noted that patient can hold conversation and make you feel like your memory is only mildly affected.  She is only oriented to self and partial place this morning.  No insight into why she is in the hospital.  CT head without acute finding.  No focal neurodeficit. -Delirium precaution -Continue home Namenda. -Minimize sedating medications -Consider basic encephalopathy work-up if no improvement   History of wide-complex tachycardia:  -Continue beta-blocker   Depression: Stable -Continue Zoloft   Hyperlipidemia -Continue simvastatin   Osteoporosis -Outpatient follow-up.  Body mass index is 27.42 kg/m.          DVT prophylaxis:  apixaban (ELIQUIS) tablet 2.5 mg Start: 08/06/22 1100 apixaban (ELIQUIS) tablet 2.5 mg  Code Status: DNR/DNI Family Communication: Attempted to call patient's daughter for update but no answer. Level of care: Med-Surg Status is: Observation The patient will require care spanning > 2 midnights and should be moved to inpatient because: Acute cystitis and altered mental status   Final disposition: Likely back to ALF once  medically clear. Consultants:  Orthopedic surgery  Sch Meds:  Scheduled Meds:  apixaban  2.5 mg Oral BID   aspirin EC  81 mg Oral Daily   feeding supplement  237 mL Oral BID BM   lidocaine  2 Application Topical Q12H   melatonin  3 mg Oral QHS   memantine  10 mg Oral BID   metoprolol tartrate  12.5 mg Oral BID   Muscle Rub   Topical TID   senna-docusate  2 tablet Oral QHS   sertraline  100 mg Oral QHS   simvastatin  20 mg Oral q1800   sodium chloride flush  3 mL Intravenous Q12H   Continuous Infusions:  cefTRIAXone (ROCEPHIN)  IV 1 g (08/07/22 0513)   PRN Meds:.acetaminophen **OR** acetaminophen, albuterol, HYDROcodone-acetaminophen, ondansetron **OR** ondansetron (ZOFRAN) IV, mouth rinse, polyethylene glycol  Antimicrobials: Anti-infectives (From admission, onward)    Start     Dose/Rate Route Frequency Ordered Stop   08/07/22 0600  cefTRIAXone (ROCEPHIN) 1 g in sodium chloride 0.9 % 100 mL IVPB        1 g 200 mL/hr over 30 Minutes Intravenous Every 24 hours 08/06/22 0819  08/06/22 0615  cefTRIAXone (ROCEPHIN) 1 g in sodium chloride 0.9 % 100 mL IVPB        1 g 200 mL/hr over 30 Minutes Intravenous  Once 08/06/22 0606 08/06/22 0743        I have personally reviewed the following labs and images: CBC: Recent Labs  Lab 08/06/22 0247 08/06/22 0400 08/07/22 1113  WBC  --  12.1* 9.6  NEUTROABS  --  8.9*  --   HGB 12.6 12.0 10.8*  HCT 37.0 38.4 34.1*  MCV  --  94.6 95.0  PLT  --  253 206   BMP &GFR Recent Labs  Lab 08/06/22 0247 08/06/22 0400 08/07/22 1029  NA 138 139 136  K 4.2 3.8 4.5  CL 106 104 106  CO2  --  23 23  GLUCOSE 116* 120* 115*  BUN 34* 29* 22  CREATININE 1.20* 1.29* 1.11*  CALCIUM  --  9.2 8.5*   Estimated Creatinine Clearance: 31.7 mL/min (A) (by C-G formula based on SCr of 1.11 mg/dL (H)). Liver & Pancreas: Recent Labs  Lab 08/06/22 0400  AST 19  ALT 13  ALKPHOS 92  BILITOT 0.9  PROT 6.4*  ALBUMIN 3.6   No results for  input(s): "LIPASE", "AMYLASE" in the last 168 hours. No results for input(s): "AMMONIA" in the last 168 hours. Diabetic: No results for input(s): "HGBA1C" in the last 72 hours. No results for input(s): "GLUCAP" in the last 168 hours. Cardiac Enzymes: Recent Labs  Lab 08/06/22 0400  CKTOTAL 130   No results for input(s): "PROBNP" in the last 8760 hours. Coagulation Profile: Recent Labs  Lab 08/06/22 0400  INR 1.2   Thyroid Function Tests: No results for input(s): "TSH", "T4TOTAL", "FREET4", "T3FREE", "THYROIDAB" in the last 72 hours. Lipid Profile: No results for input(s): "CHOL", "HDL", "LDLCALC", "TRIG", "CHOLHDL", "LDLDIRECT" in the last 72 hours. Anemia Panel: No results for input(s): "VITAMINB12", "FOLATE", "FERRITIN", "TIBC", "IRON", "RETICCTPCT" in the last 72 hours. Urine analysis:    Component Value Date/Time   COLORURINE YELLOW 08/06/2022 0516   APPEARANCEUR HAZY (A) 08/06/2022 0516   LABSPEC 1.024 08/06/2022 0516   PHURINE 5.0 08/06/2022 0516   GLUCOSEU NEGATIVE 08/06/2022 0516   HGBUR SMALL (A) 08/06/2022 0516   BILIRUBINUR NEGATIVE 08/06/2022 0516   KETONESUR NEGATIVE 08/06/2022 0516   PROTEINUR NEGATIVE 08/06/2022 0516   UROBILINOGEN 0.2 05/13/2015 1849   NITRITE POSITIVE (A) 08/06/2022 0516   LEUKOCYTESUR LARGE (A) 08/06/2022 0516   Sepsis Labs: Invalid input(s): "PROCALCITONIN", "LACTICIDVEN"  Microbiology: Recent Results (from the past 240 hour(s))  Urine Culture     Status: Abnormal (Preliminary result)   Collection Time: 08/06/22  5:16 AM   Specimen: Urine, Clean Catch  Result Value Ref Range Status   Specimen Description URINE, CLEAN CATCH  Final   Special Requests   Final    NONE Performed at [facility], [address] [place], [place] [address]    Culture >=100,000 COLONIES/mL GRAM NEGATIVE RODS (A)  Final   Report Status PENDING  Incomplete  MRSA Next Gen by PCR, Nasal     Status: None   Collection Time: 08/06/22  9:57 PM    Specimen: Nasal Mucosa; Nasal Swab  Result Value Ref Range Status   MRSA by PCR Next Gen NOT DETECTED NOT DETECTED Final    Comment: (NOTE) The GeneXpert MRSA Assay (FDA approved for NASAL specimens only), is one component of a comprehensive MRSA colonization surveillance program. It is not intended to diagnose  MRSA infection nor to guide or monitor treatment for MRSA infections. Test performance is not FDA approved in patients less than 3 years old. Performed at [facility], [address], [place], [place] [address]     Radiology Studies: DG Elbow 2 Views Right  Result Date: 08/06/2022 CLINICAL DATA:  Fall, dementia EXAM: RIGHT ELBOW - 2 VIEW COMPARISON:  02/08/2022 FINDINGS: Redemonstrated postoperative findings of extensive plate and screw fixation of the distal humerus as well as the olecranon. No evident perihardware fracture or component malpositioning. No obvious elbow joint effusion on lateral view performed in extension. Cast material about the elbow. IMPRESSION: 1. Redemonstrated postoperative findings of extensive plate and screw fixation of the distal humerus as well as the olecranon. No evident perihardware fracture or component malpositioning. 2. No obvious elbow joint effusion on limited lateral view performed in extension. Electronically Signed   By: Delanna Ahmadi M.D.   On: 08/06/2022 16:58      Terez Montee T. [place]  If 7PM-7AM, please contact night-coverage [email or web address] 08/07/2022, 11:37 AM

## 2022-08-07 NOTE — Progress Notes (Signed)
Ortho Trauma Note  Received consult regarding this patient known from previous hip fracture last year with left proximal humerus and right nondisplaced distal radius. Both can be treated nonoperatively. Will recommend splint for RUE and sling for comfort to LUE. Patient will likely be limited by pain initially but can be WBAT thru elbow on RUE and NWB for LUE. Depending on mobility may transition to removable splint to RUE tomorrow. Formal consult to follow.  Shona Needles, MD [facility] [phone] (office) [email or web address]

## 2022-08-08 DIAGNOSIS — R000 Tachycardia, unspecified: Secondary | ICD-10-CM | POA: Diagnosis not present

## 2022-08-08 DIAGNOSIS — N3000 Acute cystitis without hematuria: Secondary | ICD-10-CM | POA: Diagnosis not present

## 2022-08-08 DIAGNOSIS — W19XXXA Unspecified fall, initial encounter: Secondary | ICD-10-CM | POA: Diagnosis not present

## 2022-08-08 DIAGNOSIS — S52501A Unspecified fracture of the lower end of right radius, initial encounter for closed fracture: Secondary | ICD-10-CM | POA: Diagnosis not present

## 2022-08-08 LAB — RENAL FUNCTION PANEL
Albumin: 2.8 g/dL — ABNORMAL LOW (ref 3.5–5.0)
Anion gap: 10 (ref 5–15)
BUN: 18 mg/dL (ref 8–23)
CO2: 26 mmol/L (ref 22–32)
Calcium: 8.9 mg/dL (ref 8.9–10.3)
Chloride: 104 mmol/L (ref 98–111)
Creatinine, Ser: 0.9 mg/dL (ref 0.44–1.00)
GFR, Estimated: >60 mL/min (ref >60)
Glucose, Bld: 143 mg/dL — ABNORMAL HIGH (ref 70–99)
Phosphorus: 3.6 mg/dL (ref 2.5–4.6)
Potassium: 4.3 mmol/L (ref 3.5–5.1)
Sodium: 140 mmol/L (ref 135–145)

## 2022-08-08 LAB — URINE CULTURE: Culture: >=100000 — AB

## 2022-08-08 LAB — CBC
HCT: 32.2 % — ABNORMAL LOW (ref 36.0–46.0)
Hemoglobin: 10.2 g/dL — ABNORMAL LOW (ref 12.0–15.0)
MCH: 29.9 pg (ref 26.0–34.0)
MCHC: 31.7 g/dL (ref 30.0–36.0)
MCV: 94.4 fL (ref 80.0–100.0)
Platelets: 218 10*3/uL (ref 150–400)
RBC: 3.41 MIL/uL — ABNORMAL LOW (ref 3.87–5.11)
RDW: 12.3 % (ref 11.5–15.5)
WBC: 8.8 10*3/uL (ref 4.0–10.5)
nRBC: 0 % (ref 0.0–0.2)

## 2022-08-08 LAB — MAGNESIUM: Magnesium: 1.6 mg/dL — ABNORMAL LOW (ref 1.7–2.4)

## 2022-08-08 MED ORDER — CEFADROXIL 500 MG PO CAPS: 500.0000 mg | ORAL_CAPSULE | Freq: Two times a day (BID) | ORAL | 0 refills | Status: AC

## 2022-08-08 MED ORDER — CEFAZOLIN SODIUM-DEXTROSE 1-4 GM/50ML-% IV SOLN
1.0000 g | Freq: Three times a day (TID) | INTRAVENOUS | Status: DC
  Administered 2022-08-09: 1 g via INTRAVENOUS
  Filled 2022-08-08 (×3): qty 50

## 2022-08-08 MED ORDER — ACETAMINOPHEN 500 MG PO TABS: 1000.0000 mg | ORAL_TABLET | Freq: Three times a day (TID) | ORAL | 0 refills | Status: DC | PRN

## 2022-08-08 MED ORDER — MAGNESIUM SULFATE 2 GM/50ML IV SOLN
2.0000 g | Freq: Once | INTRAVENOUS | Status: AC
  Administered 2022-08-08: 2 g via INTRAVENOUS
  Filled 2022-08-08: qty 50

## 2022-08-08 NOTE — TOC Progression Note (Addendum)
Transition of Care [facility]) - Initial/Assessment Note    Patient Details  Name: [name] MRN: [id number] Date of Birth: [birth year]  Transition of Care [facility]) CM/SW Contact:    Milinda Antis, [place] Phone Number: 08/08/2022, 10:18 AM  Clinical Narrative:                 10:15- CSW contacted [facility] [phone]) to inquire about what would be needed for the patient to return to the facility and is awaiting a response.    CSW also contacted the patient's daughter to inform the daughter of the above information.    15:30- After not receiving a returned call, CSW reached back out to [facility] to inquire about the patient possibly returning.  This facility is an assisted living facility and although the patient was in a wheelchair prior to admission, the patient was able to transfer and stand with standby assistance.  1400-  CSW contacted the patient's daughter and explained PT's recommendation for SNF.  The daughter has a preference for [facility] as the patient was there in the past.  CSW explained insurance process.  CSW will send out referral.   Patient will need 3 midnight stay prior to discharging to a SNF.    Patient Goals and CMS Choice        Expected Discharge Plan and Services           Expected Discharge Date: 08/08/22                                    Prior Living Arrangements/Services                       Activities of Daily Living Home Assistive Devices/Equipment: None ADL Screening (condition at time of admission) Patient's cognitive ability adequate to safely complete daily activities?: No Is the patient deaf or have difficulty hearing?: No Does the patient have difficulty seeing, even when wearing glasses/contacts?: No Does the patient have difficulty concentrating, remembering, or making decisions?: Yes Patient able to express need for assistance with ADLs?: Yes Does the patient have difficulty dressing or  bathing?: Yes Independently performs ADLs?: No Communication: Independent Dressing (OT): Dependent Is this a change from baseline?: Pre-admission baseline Grooming: Dependent Is this a change from baseline?: Pre-admission baseline Feeding: Dependent Is this a change from baseline?: Pre-admission baseline Bathing: Dependent Is this a change from baseline?: Pre-admission baseline Toileting: Dependent Is this a change from baseline?: Pre-admission baseline In/Out Bed: Dependent Is this a change from baseline?: Pre-admission baseline Walks in Home: Dependent Is this a change from baseline?: Pre-admission baseline Does the patient have difficulty walking or climbing stairs?: Yes Weakness of Legs: Both Weakness of Arms/Hands: Both  Permission Sought/Granted                  Emotional Assessment              Admission diagnosis:  Acute cystitis without hematuria [N30.00] Contusion of face, initial encounter [S00.83XA] Fall, initial encounter [W19.XXXA] Other closed nondisplaced fracture of proximal end of left humerus, initial encounter [S42.295A] Closed fracture of distal end of right radius, unspecified fracture morphology, initial encounter [S52.501A] Acute cystitis [N30.00] Patient Active Problem List   Diagnosis Date Noted   Acute cystitis 08/07/2022   Acute cystitis without hematuria 08/06/2022   Scalp hematoma 08/06/2022   Fall 08/06/2022   Distal  radial fracture 08/06/2022   Hypertensive urgency 08/06/2022   Paroxysmal atrial fibrillation (HCC) 08/06/2022   Chronic anticoagulation 08/06/2022   Acute on chronic renal insufficiency 08/06/2022   (HFpEF) heart failure with preserved ejection fraction (HCC) 08/06/2022   Closed right hip fracture, initial encounter (HCC) 09/26/2021   Head trauma 09/26/2021   Hypocalcemia 09/26/2021   Hyperglycemia 09/26/2021   Acute metabolic encephalopathy 08/10/2021   COVID-19 08/09/2021   History of syncope 08/13/2019    Syncope and collapse 01/23/2019   Leukocytosis 01/23/2019   Diarrhea 01/23/2019   Prolonged QT interval 01/23/2019   Acute confusional state 06/19/2015   Memory loss 06/19/2015   DM II (diabetes mellitus, type II), controlled (HCC) 05/14/2015   Hyperlipidemia 05/14/2015   Essential hypertension, benign    TIA (transient ischemic attack) 05/13/2015   Anemia, iron deficiency 12/12/2011   Fracture of proximal end of left humerus 12/11/2011   Hypoxia 12/11/2011   Wide-complex tachycardia 12/11/2011   Acute hypotension 12/11/2011   PCP:  Lupita Raider, MD Pharmacy:   [id number] - [place], [place] - [address] AT [facility] [address] [place] [address] Phone: [phone] Fax: [phone]  [id number] - [place], [place] - [address] AT [facility] [address] [place] [address] Phone: [phone] Fax: [phone]     Social Determinants of Health (SDOH) Interventions    Readmission Risk Interventions     No data to display

## 2022-08-08 NOTE — [place] FL2 (Signed)
[place] MEDICAID FL2 LEVEL OF CARE SCREENING TOOL     IDENTIFICATION  Patient Name: [name] Birthdate: January 17, 1932 Sex: female Admission Date (Current Location): 08/06/2022  [facility] [place] Number:  Herbalist and Address:  [facility], [place] [address], [place], [place] [address]      Provider Number: [id number]  Attending Physician Name and Address:  Mercy Riding, MD  Relative Name and Phone Number:  Noni Saupe (Daughter)   [phone]    Current Level of Care: Hospital Recommended Level of Care: [place] Prior Approval Number:    Date Approved/Denied:   PASRR Number: [id number]  Discharge Plan: SNF    Current Diagnoses: Patient Active Problem List   Diagnosis Date Noted   Acute cystitis 08/07/2022   Acute cystitis without hematuria 08/06/2022   Scalp hematoma 08/06/2022   Fall 08/06/2022   Distal radial fracture 08/06/2022   Hypertensive urgency 08/06/2022   Paroxysmal atrial fibrillation ([place]) 08/06/2022   Chronic anticoagulation 08/06/2022   Acute on chronic renal insufficiency 08/06/2022   (HFpEF) heart failure with preserved ejection fraction ([employer]) 08/06/2022   Closed right hip fracture, initial encounter ([place]) 09/26/2021   Head trauma 09/26/2021   Hypocalcemia 09/26/2021   Hyperglycemia 98/92/1194   Acute metabolic encephalopathy 17/40/8144   COVID-19 08/09/2021   History of syncope 08/13/2019   Syncope and collapse 01/23/2019   Leukocytosis 01/23/2019   Diarrhea 01/23/2019   Prolonged QT interval 01/23/2019   Acute confusional state 06/19/2015   Memory loss 06/19/2015   DM II (diabetes mellitus, type II), controlled ([place]) 05/14/2015   Hyperlipidemia 05/14/2015   Essential hypertension, benign    TIA (transient ischemic attack) 05/13/2015   Anemia, iron deficiency 12/12/2011   Fracture of proximal end of left humerus 12/11/2011   Hypoxia 12/11/2011   Wide-complex tachycardia  12/11/2011   Acute hypotension 12/11/2011    Orientation RESPIRATION BLADDER Height & Weight     Self  O2 (1.5L) Incontinent Weight: 149 lb 14.6 oz (68 kg) Height:  5\' 2"  (157.5 cm)  BEHAVIORAL SYMPTOMS/MOOD NEUROLOGICAL BOWEL NUTRITION STATUS      Incontinent, Continent Diet (see d/c summary)  AMBULATORY STATUS COMMUNICATION OF NEEDS Skin   Total Care Verbally Surgical wounds, Bruising                       Personal Care Assistance Level of Assistance  Bathing, Total care, Dressing, Feeding Bathing Assistance: Maximum assistance Feeding assistance: Limited assistance   Total Care Assistance: Maximum assistance   Functional Limitations Info  Sight, Speech, Hearing Sight Info: Impaired Hearing Info: Adequate Speech Info: Adequate    SPECIAL CARE FACTORS FREQUENCY  PT (By licensed PT), OT (By licensed OT)     PT Frequency: 5x/ week OT Frequency: 5x/ week            Contractures      Additional Factors Info  Psychotropic, Allergies, Code Status Code Status Info: DNR Allergies Info: Sulfa Antibiotics   Latex Psychotropic Info: Zoloft 100mg          Current Medications (08/08/2022):  This is the current hospital active medication list Current Facility-Administered Medications  Medication Dose Route Frequency Provider Last Rate Last Admin   acetaminophen (TYLENOL) tablet 650 mg  650 mg Oral Q6H PRN Fuller Plan A, MD   650 mg at 08/08/22 1218   Or   acetaminophen (TYLENOL) suppository 650 mg  650 mg Rectal Q6H PRN Norval Morton, MD  albuterol (PROVENTIL) (2.5 MG/3ML) 0.083% nebulizer solution 2.5 mg  2.5 mg Nebulization Q6H PRN Madelyn Flavors A, MD       apixaban (ELIQUIS) tablet 2.5 mg  2.5 mg Oral BID Katrinka Blazing, Rondell A, MD   2.5 mg at 08/08/22 0908   aspirin EC tablet 81 mg  81 mg Oral Daily Smith, Rondell A, MD   81 mg at 08/08/22 0909   [START ON 08/09/2022] ceFAZolin (ANCEF) IVPB 1 g/50 mL premix  1 g Intravenous Q8H Pierce, Dwayne A, RPH        feeding supplement (ENSURE ENLIVE / ENSURE PLUS) liquid 237 mL  237 mL Oral BID BM Smith, Rondell A, MD   237 mL at 08/08/22 1336   lidocaine (LMX) 4 % cream 2 Application  2 Application Topical Q12H Madelyn Flavors A, MD   2 Application at 08/08/22 0909   melatonin tablet 3 mg  3 mg Oral QHS Smith, Rondell A, MD   3 mg at 08/07/22 2028   memantine (NAMENDA) tablet 10 mg  10 mg Oral BID Madelyn Flavors A, MD   10 mg at 08/08/22 0908   metoprolol tartrate (LOPRESSOR) tablet 12.5 mg  12.5 mg Oral BID Madelyn Flavors A, MD   12.5 mg at 08/08/22 0908   Muscle Rub CREA   Topical TID Clydie Braun, MD   Given at 08/08/22 1542   ondansetron (ZOFRAN) tablet 4 mg  4 mg Oral Q6H PRN Madelyn Flavors A, MD       Or   ondansetron (ZOFRAN) injection 4 mg  4 mg Intravenous Q6H PRN Madelyn Flavors A, MD   4 mg at 08/06/22 0856   Oral care mouth rinse  15 mL Mouth Rinse PRN Smith, Rondell A, MD       polyethylene glycol (MIRALAX / GLYCOLAX) packet 17 g  17 g Oral Daily PRN Madelyn Flavors A, MD       senna-docusate (Senokot-S) tablet 2 tablet  2 tablet Oral QHS Madelyn Flavors A, MD   2 tablet at 08/07/22 2028   sertraline (ZOLOFT) tablet 100 mg  100 mg Oral QHS Smith, Rondell A, MD   100 mg at 08/07/22 2029   simvastatin (ZOCOR) tablet 20 mg  20 mg Oral q1800 Smith, Rondell A, MD   20 mg at 08/07/22 1721   sodium chloride flush (NS) 0.9 % injection 3 mL  3 mL Intravenous Q12H Smith, Rondell A, MD   3 mL at 08/08/22 1048     Discharge Medications: Please see discharge summary for a list of discharge medications.  Relevant Imaging Results:  Relevant Lab Results:   Additional Information SS#[id number],  5'4" 163lbs, patient has had 2 COVID vaccines and a booster  Theda Payer F Fareeda Downard, LCSWA

## 2022-08-08 NOTE — TOC CAGE-AID Note (Signed)
Transition of Care [facility]) - CAGE-AID Screening   Patient Details  Name: [name] MRN: [id number] Date of Birth: Oct 12, 1932  Transition [facility] Care [facility]) CM/SW Contact:    Clovis Cao, RN Phone Number: 08/08/2022, 5:48 PM   Clinical Narrative: Pt not oriented.  Pt has hx of alzheimers.  Screening unable to be completed.   CAGE-AID Screening: Substance Abuse Screening unable to be completed due to: : Patient unable to participate

## 2022-08-08 NOTE — Discharge Summary (Addendum)
Physician Discharge Summary  [name] ZOX:[id number] DOB: [birth year] DOA: 08/06/2022  PCP: Lupita Raider, MD  Admit date: 08/06/2022 Discharge date: 08/08/2022 Admitted From: ALF Disposition: SNF Recommendations for Outpatient Follow-up:  Follow up with PCP in 1 week Follow-up with orthopedic surgery in 2 weeks Check BMP and CBC in 1 to 2 weeks Reassess risk and benefit of anticoagulation given fall risk Please follow up on the following pending results: None   Discharge Condition: Stable CODE STATUS: DNR/DNI  Follow-up Information     Haddix, Gillie Manners, MD. Schedule an appointment as soon as possible for a visit in 2 week(s).   Specialty: Orthopedic Surgery Contact information: [address] [place] [address] [phone]                 Hospital course 88 year old F with PMH of Alzheimer's dementia, CVA, diastolic CHF, paroxysmal A-fib on Eliquis, DM-2, CKD-3A, osteoarthritis, osteoporosis and anemia brought to ED from ALF after she fell and broke her arms.  Reportedly hit her head and had severe left upper arm, shoulder and right wrist pain.  Imaging showed acute distal right radial, acute left humeral and old pubic rami fracture.  Head, cervical spine and maxillofacial CT showed large left frontal scalp hematoma without acute fracture or intracranial process.  Patient had sling for left humeral fracture and sugar-tong splint for right distal radial fracture.  UA concerning for UTI.  Cultures obtained.  She was started on IV antibiotics and admitted for acute cystitis with hematuria, and hypertensive urgency.  The next day, orthopedic surgery, Dr. Jena Gauss consulted and recommended nonoperative management in sugar-tong splint, WBAT through elbow but NWB below elbow for right wrist fracture, sling and NWB for left upper extremity and outpatient follow-up in 2 weeks.   In regards to UTI, urine culture grew pansensitive Klebsiella pneumonia except to ampicillin.   Antibiotic de-escalated to IV Ancef.  She is discharged on p.o. cefadroxil 500 mg twice daily for 3 more days to complete a total of 5 days course.  Patient also on Eliquis and low-dose aspirin prior to presentation.  She is on Eliquis for A-fib.  I do not see clear indication for low-dose aspirin.  She has history of TIA but no history of CAD, PAD or CVA.  Patient has significant risk for CVA with CHA2DS2-VASc score > 5.  She is also high risk for fall.  Patient's daughter reports about 4 falls in the last 12 months.  After risk-benefit discussion, patient's daughter prefers to continue low-dose Eliquis and discontinue aspirin.  We have also discontinued Norco to reduce risk of fall.  Further discussion per PCP.  Therapy recommended SNF.  See individual problem list below for more.   Problems addressed during this hospitalization Principal Problem:   Acute cystitis without hematuria Active Problems:   Fracture of proximal end of left humerus   Scalp hematoma   Fall   Distal radial fracture   Leukocytosis   Hypertensive urgency   Paroxysmal atrial fibrillation (HCC)   Chronic anticoagulation   Acute on chronic renal insufficiency   (HFpEF) heart failure with preserved ejection fraction (HCC)   DM II (diabetes mellitus, type II), controlled (HCC)   Wide-complex tachycardia   Hyperlipidemia   Acute cystitis   Acute cystitis with hematuria due to Klebsiella pneumoniae: Based on urinalysis and urine culture since patient is not a reliable historian.  She also have leukocytosis but no fever.  Hemodynamically stable.  Urine culture with with pansensitive Klebsiella pneumonia  except to ampicillin. -IV ceftriaxone>> IV Ancef>> p.o. cefadroxil 500 mg twice daily for 3 more days to complete 5 days total   Accidental fall at ALF: Likely from not using her walker. Acute distal right radial metaphyseal fracture can [place] to fall Acute proximal left humerus fracture secondary to fall Acute scalp  hematoma secondary to fall Per orthopedic surgery- Right wrist fx -- Plan non-operative management in sugar tong splint. NWB, may WBAT through elbow Left humerus fx -- Non-operative management with sling, NWB. F/u with Dr. Jena Gauss in 2 weeks. -Tylenol 1 g 3 times daily as needed pain    Uncontrolled hypertension: BP elevated to 183/73 on arrival.  This could be pain related.  Normotensive -Continue metoprolol   Paroxysmal A-fib on Eliquis: CHA2DS2-VASc score at least 6.  However, patient is high risk for fall.  -Plan to continue low-dose Eliquis after discussion with patient's daughter.  Discontinued aspirin -Continue metoprolol for rate control   CKD-3A: Creatinine slightly higher than baseline but improved.  CK normal. Recent Labs    09/26/21 0742 09/28/21 0518 09/29/21 0213 09/30/21 0209 11/08/21 1258 02/08/22 2005 08/06/22 0247 08/06/22 0400 08/07/22 1029 08/08/22 0831  BUN 12 21 19 17 12 21  34* 29* 22 18  CREATININE 0.96 1.28* 1.07* 0.83 1.05* 1.02* 1.20* 1.29* 1.11* 0.90  -Avoid nephrotoxic meds -Recheck renal function in 1 to 2 weeks   Chronic diastolic CHF: TTE in 07/2021 with LVEF of 60 to 65% and G1-DD.  Appears euvolemic on exam.  Not on diuretics at home.   Controlled NIDDM-2: A1c 5.5% in 07/2021.  Does not seem to be on medication at home.   Alzheimer's dementia without behavioral disturbance: Seems to be at baseline.  She is awake and oriented to self, place but not time.  Follows commands. -Delirium precaution -Continue home Namenda.   History of wide-complex tachycardia:  -Continue beta-blocker   Depression: Stable -Continue Zoloft   Hyperlipidemia -Continue simvastatin   Osteoporosis -Outpatient follow-up.  Family communication: Updated patient's daughter over the phone on the day of discharge.           Vital signs Vitals:   08/07/22 1817 08/07/22 2043 08/08/22 0459 08/08/22 0905  BP: (!) 136/46 (!) 132/38 (!) 130/48 (!) 136/48  Pulse: 74  81 70 73  Temp: 98.3 F (36.8 C) 98.4 F (36.9 C) 98.2 F (36.8 C) 98.1 F (36.7 C)  Resp: 18 18 16 17   Height:      Weight:      SpO2: 97% 99% 99% 96%  TempSrc:    Oral  BMI (Calculated):         Discharge exam  GENERAL: No apparent distress.  Nontoxic. HEENT: MMM.  Bruising in the face.  Hematoma over left frontal head NECK: Supple.  No apparent JVD.  RESP:  No IWOB.  Fair aeration bilaterally. CVS:  RRR. Heart sounds normal.  ABD/GI/GU: BS+. Abd soft, NTND.  MSK/EXT:  Moves extremities.  Sugar splint to right forearm.  Left arm in a sling. SKIN: Facial bruise and hematoma as above. NEURO: Awake and alert. Oriented to self and place.  Follows commands.  No apparent focal neuro deficit. PSYCH: Calm. Normal affect.   Discharge Instructions Discharge Instructions     Diet general   Complete by: As directed    Increase activity slowly   Complete by: As directed    No wound care   Complete by: As directed       Allergies as of 08/08/2022  Reactions   Sulfa Antibiotics Hives, Diarrhea, Nausea And Vomiting, Swelling   Facial swelling ALSO   Latex Rash   NOT ON MAR        Medication List     STOP taking these medications    aspirin EC 81 MG tablet   HYDROcodone-acetaminophen 5-325 MG tablet Commonly known as: NORCO/VICODIN       TAKE these medications    acetaminophen 500 MG tablet Commonly known as: TYLENOL Take 2 tablets (1,000 mg total) by mouth every 8 (eight) hours as needed. What changed:  when to take this reasons to take this   apixaban 2.5 MG Tabs tablet Commonly known as: ELIQUIS Take 1 tablet (2.5 mg total) by mouth 2 (two) times daily.   Aspercreme Lidocaine 4 % Crea Generic drug: Lidocaine HCl Apply 1 Application topically every 12 (twelve) hours.   Biofreeze Professional 5 % Gel Generic drug: Menthol (Topical Analgesic) Apply 1 application topically in the morning, at noon, and at bedtime. Apply on right knee   cefadroxil  500 MG capsule Commonly known as: DURICEF Take 1 capsule (500 mg total) by mouth 2 (two) times daily for 3 days.   cyanocobalamin 1000 MCG tablet Take 1,000 mcg by mouth daily.   feeding supplement Liqd Take 237 mLs by mouth 2 (two) times daily between meals.   ferrous gluconate 324 MG tablet Commonly known as: FERGON Take 324 mg by mouth daily with breakfast.   melatonin 3 MG Tabs tablet Take 3 mg by mouth at bedtime.   memantine 10 MG tablet Commonly known as: NAMENDA Take 10 mg by mouth 2 (two) times daily.   metoprolol tartrate 25 MG tablet Commonly known as: LOPRESSOR Take 0.5 tablets (12.5 mg total) by mouth 2 (two) times daily.   polyethylene glycol 17 g packet Commonly known as: MIRALAX / GLYCOLAX Take 17 g by mouth daily as needed for mild constipation.   senna-docusate 8.6-50 MG tablet Commonly known as: Senokot-S Take 1 tablet by mouth 2 (two) times daily. What changed:  how much to take when to take this   sertraline 100 MG tablet Commonly known as: ZOLOFT Take 100 mg by mouth at bedtime.   simvastatin 20 MG tablet Commonly known as: ZOCOR Take 1 tablet (20 mg total) by mouth daily at 6 PM.   TUMS CHEWY BITES PO Take 200 mg by mouth in the morning and at bedtime.   VITAMIN D2 PO Take 1,250 mcg by mouth once a week. monday        Consultations: Orthopedic surgery  Procedures/Studies:   DG Elbow 2 Views Right  Result Date: 08/06/2022 CLINICAL DATA:  Fall, dementia EXAM: RIGHT ELBOW - 2 VIEW COMPARISON:  02/08/2022 FINDINGS: Redemonstrated postoperative findings of extensive plate and screw fixation of the distal humerus as well as the olecranon. No evident perihardware fracture or component malpositioning. No obvious elbow joint effusion on lateral view performed in extension. Cast material about the elbow. IMPRESSION: 1. Redemonstrated postoperative findings of extensive plate and screw fixation of the distal humerus as well as the olecranon.  No evident perihardware fracture or component malpositioning. 2. No obvious elbow joint effusion on limited lateral view performed in extension. Electronically Signed   By: Delanna Ahmadi M.D.   On: 08/06/2022 16:58   CT Head Wo Contrast  Result Date: 08/06/2022 CLINICAL DATA:  Facial trauma EXAM: CT HEAD WITHOUT CONTRAST CT MAXILLOFACIAL WITHOUT CONTRAST CT CERVICAL SPINE WITHOUT CONTRAST TECHNIQUE: Multidetector CT imaging of the head, cervical spine,  and maxillofacial structures were performed using the standard protocol without intravenous contrast. Multiplanar CT image reconstructions of the cervical spine and maxillofacial structures were also generated. RADIATION DOSE REDUCTION: This exam was performed according to the departmental dose-optimization program which includes automated exposure control, adjustment of the mA and/or kV according to patient size and/or use of iterative reconstruction technique. COMPARISON:  08/03/2022 head CT FINDINGS: CT HEAD FINDINGS Brain: There is no mass, hemorrhage or extra-axial collection. There is generalized atrophy without lobar predilection. There is hypoattenuation of the periventricular white matter, most commonly indicating chronic ischemic microangiopathy. 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 No intracranial atherosclerosis. Skull: Large left frontal scalp hematoma.  No skull fracture. CT MAXILLOFACIAL FINDINGS Osseous: --Complex facial fracture types: No LeFort, zygomaticomaxillary complex or nasoorbitoethmoidal fracture. --Simple fracture types: None. --Mandible: No fracture or dislocation. Orbits: The globes are intact. Normal appearance of the intra- and extraconal fat. Symmetric extraocular muscles and optic nerves. Sinuses: No fluid levels or advanced mucosal thickening. Soft tissues: Normal visualized extracranial soft tissues. CT CERVICAL SPINE FINDINGS  Alignment: No static subluxation. Facets are aligned. Occipital condyles and the lateral masses of C1-C2 are aligned. Skull base and vertebrae: No acute fracture. Soft tissues and spinal canal: No prevertebral fluid or swelling. No visible canal hematoma. Disc levels: No advanced spinal canal or neural foraminal stenosis. Upper chest: No pneumothorax, pulmonary nodule or pleural effusion. Other: Normal visualized paraspinal cervical soft tissues. IMPRESSION: 1. No acute intracranial abnormality. 2. Large left frontal scalp hematoma without skull fracture or facial fracture. 3. No acute fracture or static subluxation of the cervical spine. Electronically Signed   By: Deatra Robinson M.D.   On: 08/06/2022 03:49   CT Cervical Spine Wo Contrast  Result Date: 08/06/2022 CLINICAL DATA:  Facial trauma EXAM: CT HEAD WITHOUT CONTRAST CT MAXILLOFACIAL WITHOUT CONTRAST CT CERVICAL SPINE WITHOUT CONTRAST TECHNIQUE: Multidetector CT imaging of the head, cervical spine, and maxillofacial structures were performed using the standard protocol without intravenous contrast. Multiplanar CT image reconstructions of the cervical spine and maxillofacial structures were also generated. RADIATION DOSE REDUCTION: This exam was performed according to the departmental dose-optimization program which includes automated exposure control, adjustment of the mA and/or kV according to patient size and/or use of iterative reconstruction technique. COMPARISON:  08/03/2022 head CT FINDINGS: CT HEAD FINDINGS Brain: There is no mass, hemorrhage or extra-axial collection. There is generalized atrophy without lobar predilection. There is hypoattenuation of the periventricular white matter, most commonly indicating chronic ischemic microangiopathy. 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 No intracranial atherosclerosis. Skull: Large left frontal scalp  hematoma.  No skull fracture. CT MAXILLOFACIAL FINDINGS Osseous: --Complex facial fracture types: No LeFort, zygomaticomaxillary complex or nasoorbitoethmoidal fracture. --Simple fracture types: None. --Mandible: No fracture or dislocation. Orbits: The globes are intact. Normal appearance of the intra- and extraconal fat. Symmetric extraocular muscles and optic nerves. Sinuses: No fluid levels or advanced mucosal thickening. Soft tissues: Normal visualized extracranial soft tissues. CT CERVICAL SPINE FINDINGS Alignment: No static subluxation. Facets are aligned. Occipital condyles and the lateral masses of C1-C2 are aligned. Skull base and vertebrae: No acute fracture. Soft tissues and spinal canal: No prevertebral fluid or swelling. No visible canal hematoma. Disc levels: No advanced spinal canal or neural foraminal stenosis. Upper chest: No pneumothorax, pulmonary nodule or pleural effusion. Other: Normal visualized paraspinal cervical soft tissues. IMPRESSION: 1.  No acute intracranial abnormality. 2. Large left frontal scalp hematoma without skull fracture or facial fracture. 3. No acute fracture or static subluxation of the cervical spine. Electronically Signed   By: Deatra Robinson M.D.   On: 08/06/2022 03:49   CT Maxillofacial WO CM  Result Date: 08/06/2022 CLINICAL DATA:  Facial trauma EXAM: CT HEAD WITHOUT CONTRAST CT MAXILLOFACIAL WITHOUT CONTRAST CT CERVICAL SPINE WITHOUT CONTRAST TECHNIQUE: Multidetector CT imaging of the head, cervical spine, and maxillofacial structures were performed using the standard protocol without intravenous contrast. Multiplanar CT image reconstructions of the cervical spine and maxillofacial structures were also generated. RADIATION DOSE REDUCTION: This exam was performed according to the departmental dose-optimization program which includes automated exposure control, adjustment of the mA and/or kV according to patient size and/or use of iterative reconstruction technique.  COMPARISON:  08/03/2022 head CT FINDINGS: CT HEAD FINDINGS Brain: There is no mass, hemorrhage or extra-axial collection. There is generalized atrophy without lobar predilection. There is hypoattenuation of the periventricular white matter, most commonly indicating chronic ischemic microangiopathy. 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 No intracranial atherosclerosis. Skull: Large left frontal scalp hematoma.  No skull fracture. CT MAXILLOFACIAL FINDINGS Osseous: --Complex facial fracture types: No LeFort, zygomaticomaxillary complex or nasoorbitoethmoidal fracture. --Simple fracture types: None. --Mandible: No fracture or dislocation. Orbits: The globes are intact. Normal appearance of the intra- and extraconal fat. Symmetric extraocular muscles and optic nerves. Sinuses: No fluid levels or advanced mucosal thickening. Soft tissues: Normal visualized extracranial soft tissues. CT CERVICAL SPINE FINDINGS Alignment: No static subluxation. Facets are aligned. Occipital condyles and the lateral masses of C1-C2 are aligned. Skull base and vertebrae: No acute fracture. Soft tissues and spinal canal: No prevertebral fluid or swelling. No visible canal hematoma. Disc levels: No advanced spinal canal or neural foraminal stenosis. Upper chest: No pneumothorax, pulmonary nodule or pleural effusion. Other: Normal visualized paraspinal cervical soft tissues. IMPRESSION: 1. No acute intracranial abnormality. 2. Large left frontal scalp hematoma without skull fracture or facial fracture. 3. No acute fracture or static subluxation of the cervical spine. Electronically Signed   By: Deatra Robinson M.D.   On: 08/06/2022 03:49   DG Chest Port 1 View  Result Date: 08/06/2022 CLINICAL DATA:  Recent fall with chest pain, initial encounter EXAM: PORTABLE CHEST 1 VIEW COMPARISON:  02/08/2022 FINDINGS: Cardiac shadow is within normal  limits. Lungs are well aerated bilaterally. No focal infiltrate or sizable effusion is seen. Left proximal humeral fracture is again noted. IMPRESSION: Proximal left humeral fracture. No acute intrathoracic abnormality. Electronically Signed   By: Alcide Clever M.D.   On: 08/06/2022 03:09   DG Pelvis Portable  Result Date: 08/06/2022 CLINICAL DATA:  Recent fall with pelvic pain, initial encounter EXAM: PORTABLE PELVIS 1-2 VIEWS COMPARISON:  08/03/2022 FINDINGS: Pelvic ring is intact. Prior fracture in the proximal right femur is noted with surgical fixation. Old pubic rami fractures are noted and stable on the right. Proximal left femur appears within normal limits. Soft tissue abnormality is noted. IMPRESSION: Changes consistent with prior fractures on the right stable from the prior exam. No acute abnormality noted. Electronically Signed   By: Alcide Clever M.D.   On: 08/06/2022 03:08   DG Shoulder Left Portable  Result Date: 08/06/2022 CLINICAL DATA:  Recent fall with left shoulder pain, initial encounter EXAM: LEFT SHOULDER COMPARISON:  None Available. FINDINGS: There is a mildly impacted fracture involving the proximal left humerus primarily  in the surgical neck with extension into the greater tuberosity. No dislocation is noted. No other fracture is seen. IMPRESSION: Proximal left humeral fracture as described. Electronically Signed   By: Alcide CleverMark  Lukens M.D.   On: 08/06/2022 03:04   DG Wrist Complete Right  Result Date: 08/06/2022 CLINICAL DATA:  Recent fall with right wrist pain, initial encounter EXAM: RIGHT WRIST - COMPLETE 3+ VIEW COMPARISON:  None Available. FINDINGS: There is a mildly impacted fracture in the distal right radial metaphysis. No significant displacement or angulation is noted. Distal ulna appears within normal limits. Visualized portions of the right hand are unremarkable. IMPRESSION: Mildly impacted distal right radial metaphyseal fracture. Electronically Signed   By: Alcide CleverMark  Lukens  M.D.   On: 08/06/2022 03:03   DG Wrist Complete Right  Result Date: 08/03/2022 CLINICAL DATA:  88 year old female status post fall from bed.  Pain. EXAM: RIGHT WRIST - COMPLETE 3+ VIEW COMPARISON:  Right hand series 09/24/2021. FINDINGS: Three views of the right wrist. Distal radius and ulna appear intact. Radiocarpal joint space loss, but carpal bone alignment maintained. No carpal bone fracture identified. First CMC joint space loss. Metacarpals appear intact. Partially visible proximal radius or ulna or IF hardware. IMPRESSION: No acute fracture or dislocation identified about the right wrist. Electronically Signed   By: Odessa FlemingH  Hall M.D.   On: 08/03/2022 06:40   DG Knee Complete 4 Views Left  Result Date: 08/03/2022 CLINICAL DATA:  Fall EXAM: LEFT KNEE - COMPLETE 4+ VIEW COMPARISON:  None Available. FINDINGS: Mild medial compartment osteoarthrosis. No acute fracture or dislocation. No joint effusion. IMPRESSION: No acute fracture or dislocation of the left knee. Mild medial compartment osteoarthrosis. Electronically Signed   By: Deatra RobinsonKevin  Herman M.D.   On: 08/03/2022 04:01   DG Hip Unilat W or Wo Pelvis 2-3 Views Left  Result Date: 08/03/2022 CLINICAL DATA:  Unwitnessed fall EXAM: DG HIP (WITH OR WITHOUT PELVIS) 2-3V LEFT COMPARISON:  Radiographs 02/08/2022 FINDINGS: Intramedullary nail fixation of the proximal right femur. Old healed intertrochanteric right femur fracture. Irregularity about the right superior and inferior pubic rami are likely due to remote trauma. No definite acute fracture. Degenerative changes pubic symphysis, both hips, SI joints and lower lumbar spine. IMPRESSION: No acute fracture or dislocation. Electronically Signed   By: Minerva Festeryler  Stutzman M.D.   On: 08/03/2022 04:01   CT HEAD WO CONTRAST (5MM)  Result Date: 08/03/2022 CLINICAL DATA:  Neck trauma EXAM: CT HEAD WITHOUT CONTRAST CT CERVICAL SPINE WITHOUT CONTRAST TECHNIQUE: Multidetector CT imaging of the head and cervical spine  was performed following the standard protocol without intravenous contrast. Multiplanar CT image reconstructions of the cervical spine were also generated. RADIATION DOSE REDUCTION: This exam was performed according to the departmental dose-optimization program which includes automated exposure control, adjustment of the mA and/or kV according to patient size and/or use of iterative reconstruction technique. COMPARISON:  None Available. FINDINGS: CT HEAD FINDINGS Brain: There is no mass, hemorrhage or extra-axial collection. There is generalized atrophy without lobar predilection. There is hypoattenuation of the periventricular white matter, most commonly indicating chronic ischemic microangiopathy. 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 Vascular: No abnormal hyperdensity of the major intracranial arteries or dural venous sinuses. No intracranial atherosclerosis. Skull: Large left frontal scalp hematoma without skull fracture. Sinuses/Orbits: No fluid levels or advanced mucosal thickening of the visualized paranasal sinuses. No mastoid or middle ear effusion. The orbits are normal. CT CERVICAL SPINE FINDINGS Alignment: No static subluxation. Facets are aligned. Occipital condyles are normally  positioned. Skull base and vertebrae: No acute fracture. Soft tissues and spinal canal: No prevertebral fluid or swelling. No visible canal hematoma. Disc levels: No advanced spinal canal or neural foraminal stenosis. Upper chest: No pneumothorax, pulmonary nodule or pleural effusion. Other: Normal visualized paraspinal cervical soft tissues. IMPRESSION: 1. No acute intracranial abnormality. 2. Large left frontal scalp hematoma without skull fracture. 3. No acute fracture or static subluxation of the cervical spine. Electronically Signed   By: Deatra Robinson M.D.   On: 08/03/2022 03:52   CT CERVICAL SPINE WO CONTRAST  Result Date: 08/03/2022 CLINICAL DATA:  Neck trauma EXAM: CT HEAD WITHOUT CONTRAST CT CERVICAL  SPINE WITHOUT CONTRAST TECHNIQUE: Multidetector CT imaging of the head and cervical spine was performed following the standard protocol without intravenous contrast. Multiplanar CT image reconstructions of the cervical spine were also generated. RADIATION DOSE REDUCTION: This exam was performed according to the departmental dose-optimization program which includes automated exposure control, adjustment of the mA and/or kV according to patient size and/or use of iterative reconstruction technique. COMPARISON:  None Available. FINDINGS: CT HEAD FINDINGS Brain: There is no mass, hemorrhage or extra-axial collection. There is generalized atrophy without lobar predilection. There is hypoattenuation of the periventricular white matter, most commonly indicating chronic ischemic microangiopathy. 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 Vascular: No abnormal hyperdensity of the major intracranial arteries or dural venous sinuses. No intracranial atherosclerosis. Skull: Large left frontal scalp hematoma without skull fracture. Sinuses/Orbits: No fluid levels or advanced mucosal thickening of the visualized paranasal sinuses. No mastoid or middle ear effusion. The orbits are normal. CT CERVICAL SPINE FINDINGS Alignment: No static subluxation. Facets are aligned. Occipital condyles are normally positioned. Skull base and vertebrae: No acute fracture. Soft tissues and spinal canal: No prevertebral fluid or swelling. No visible canal hematoma. Disc levels: No advanced spinal canal or neural foraminal stenosis. Upper chest: No pneumothorax, pulmonary nodule or pleural effusion. Other: Normal visualized paraspinal cervical soft tissues. IMPRESSION: 1. No acute intracranial abnormality. 2. Large left frontal scalp hematoma without skull fracture. 3. No acute fracture or static subluxation of the cervical spine. Electronically Signed   By: Deatra Robinson M.D.   On: 08/03/2022 03:52       The results of significant  diagnostics from this hospitalization (including imaging, microbiology, ancillary and laboratory) are listed below for reference.     Microbiology: Recent Results (from the past 240 hour(s))  Urine Culture     Status: Abnormal   Collection Time: 08/06/22  5:16 AM   Specimen: Urine, Clean Catch  Result Value Ref Range Status   Specimen Description URINE, CLEAN CATCH  Final   Special Requests   Final    NONE Performed at [facility], [address], [place], [place] [address]    Culture >=100,000 COLONIES/mL KLEBSIELLA PNEUMONIAE (A)  Final   Report Status 08/08/2022 FINAL  Final   Organism ID, Bacteria KLEBSIELLA PNEUMONIAE (A)  Final      Susceptibility   Klebsiella pneumoniae - MIC*    AMPICILLIN RESISTANT Resistant     CEFAZOLIN <=4 SENSITIVE Sensitive     CEFEPIME <=0.12 SENSITIVE Sensitive     CEFTRIAXONE <=0.25 SENSITIVE Sensitive     CIPROFLOXACIN <=0.25 SENSITIVE Sensitive     GENTAMICIN <=1 SENSITIVE Sensitive     IMIPENEM <=0.25 SENSITIVE Sensitive     NITROFURANTOIN 32 SENSITIVE Sensitive     TRIMETH/SULFA <=20 SENSITIVE Sensitive     AMPICILLIN/SULBACTAM <=2 SENSITIVE Sensitive     PIP/TAZO <=4  SENSITIVE Sensitive     * >=100,000 COLONIES/mL KLEBSIELLA PNEUMONIAE  MRSA Next Gen by PCR, Nasal     Status: None   Collection Time: 08/06/22  9:57 PM   Specimen: Nasal Mucosa; Nasal Swab  Result Value Ref Range Status   MRSA by PCR Next Gen NOT DETECTED NOT DETECTED Final    Comment: (NOTE) The GeneXpert MRSA Assay (FDA approved for NASAL specimens only), is one component of a comprehensive MRSA colonization surveillance program. It is not intended to diagnose MRSA infection nor to guide or monitor treatment for MRSA infections. Test performance is not FDA approved in patients less than 52 years old. Performed at [facility], [address] [place], [place] [address]      Labs:  CBC: Recent Labs  Lab 08/06/22 0247 08/06/22 0400 08/07/22 1113  08/08/22 0831  WBC  --  12.1* 9.6 8.8  NEUTROABS  --  8.9*  --   --   HGB 12.6 12.0 10.8* 10.2*  HCT 37.0 38.4 34.1* 32.2*  MCV  --  94.6 95.0 94.4  PLT  --  253 206 218   BMP &GFR Recent Labs  Lab 08/06/22 0247 08/06/22 0400 08/07/22 1029 08/08/22 0831  NA 138 139 136 140  K 4.2 3.8 4.5 4.3  CL 106 104 106 104  CO2  --  23 23 26   GLUCOSE 116* 120* 115* 143*  BUN 34* 29* 22 18  CREATININE 1.20* 1.29* 1.11* 0.90  CALCIUM  --  9.2 8.5* 8.9  MG  --   --  1.7 1.6*  PHOS  --   --   --  3.6   Estimated Creatinine Clearance: 39.1 mL/min (by C-G formula based on SCr of 0.9 mg/dL). Liver & Pancreas: Recent Labs  Lab 08/06/22 0400 08/08/22 0831  AST 19  --   ALT 13  --   ALKPHOS 92  --   BILITOT 0.9  --   PROT 6.4*  --   ALBUMIN 3.6 2.8*   No results for input(s): "LIPASE", "AMYLASE" in the last 168 hours. No results for input(s): "AMMONIA" in the last 168 hours. Diabetic: No results for input(s): "HGBA1C" in the last 72 hours. No results for input(s): "GLUCAP" in the last 168 hours. Cardiac Enzymes: Recent Labs  Lab 08/06/22 0400  CKTOTAL 130   No results for input(s): "PROBNP" in the last 8760 hours. Coagulation Profile: Recent Labs  Lab 08/06/22 0400  INR 1.2   Thyroid Function Tests: No results for input(s): "TSH", "T4TOTAL", "FREET4", "T3FREE", "THYROIDAB" in the last 72 hours. Lipid Profile: No results for input(s): "CHOL", "HDL", "LDLCALC", "TRIG", "CHOLHDL", "LDLDIRECT" in the last 72 hours. Anemia Panel: No results for input(s): "VITAMINB12", "FOLATE", "FERRITIN", "TIBC", "IRON", "RETICCTPCT" in the last 72 hours. Urine analysis:    Component Value Date/Time   COLORURINE YELLOW 08/06/2022 0516   APPEARANCEUR HAZY (A) 08/06/2022 0516   LABSPEC 1.024 08/06/2022 0516   PHURINE 5.0 08/06/2022 0516   GLUCOSEU NEGATIVE 08/06/2022 0516   HGBUR SMALL (A) 08/06/2022 0516   BILIRUBINUR NEGATIVE 08/06/2022 0516   KETONESUR NEGATIVE 08/06/2022 0516    PROTEINUR NEGATIVE 08/06/2022 0516   UROBILINOGEN 0.2 05/13/2015 1849   NITRITE POSITIVE (A) 08/06/2022 0516   LEUKOCYTESUR LARGE (A) 08/06/2022 0516   Sepsis Labs: Invalid input(s): "PROCALCITONIN", "LACTICIDVEN"   SIGNED:  08/08/2022, MD  [facility] 08/08/2022, 1:42 PM

## 2022-08-08 NOTE — Progress Notes (Signed)
Pharmacy Antibiotic Note  [name] is a 88 y.o. female admitted on 08/06/2022 with UTI.  Pharmacy has been consulted for cefazolin dosing.  Plan: Cefazolin 1gm Iv q8h x 5 days Monitor renal function and deescalation to PO  Height: 5\' 2"  (157.5 cm) Weight: 68 kg (149 lb 14.6 oz) IBW/kg (Calculated) : 50.1  Temp (24hrs), Avg:98.2 F (36.8 C), Min:98 F (36.7 C), Max:98.4 F (36.9 C)  Recent Labs  Lab 08/06/22 0247 08/06/22 0400 08/07/22 1029 08/07/22 1113  WBC  --  12.1*  --  9.6  CREATININE 1.20* 1.29* 1.11*  --     Estimated Creatinine Clearance: 31.7 mL/min (A) (by C-G formula based on SCr of 1.11 mg/dL (H)).    Allergies  Allergen Reactions   Sulfa Antibiotics Hives, Diarrhea, Nausea And Vomiting and Swelling    Facial swelling ALSO   Latex Rash    NOT ON MAR    Antimicrobials this admission: 9/16 Ceftriaxone >> 9/18 9/18 cefazolin >>   Dose adjustments this admission:   Microbiology results: 9/16 UCx: Klebsiella Pneumoniae, R Amp, S all others  9/16 MRSA PCR: negative  Londen Lorge A. Levada Dy, PharmD, BCPS, FNKF Clinical Pharmacist [place] Please utilize Amion for appropriate phone number to reach the unit pharmacist ([place])  08/08/2022 8:05 AM

## 2022-08-08 NOTE — Consult Note (Signed)
Reason for Consult:Bilateral UE fxs Referring Physician: Wendee Beavers Time called: 0919 Time at bedside: 0930   [name] is an 88 y.o. female.  HPI: [name] fell at home Friday night while going to the bathroom. She has dementia and cannot contribute meaningfully to history. She suffered a right wrist fx and left humerus fx and was admitted and orthopedic surgery was consulted. She is LHD.  Past Medical History:  Diagnosis Date   Allergies    Alzheimer disease ([place])    STABLE   Anemia, iron deficiency 12/12/2011   Arthritis    RF   CKD (chronic kidney disease)    STAGE 3   Diabetes mellitus    no mes, diet only    Diarrhea    RESOLVED   Hypercholesterolemia    Hyperlipemia    Hypertension    Major depression    OA (osteoarthritis)    HAND/LEFT KNEE PAIN MILD   Osteoporosis    Stroke [facility])    Syncope     Past Surgical History:  Procedure Laterality Date   INTRAMEDULLARY (IM) NAIL INTERTROCHANTERIC Right 09/27/2021   Procedure: INTRAMEDULLARY (IM) NAIL INTERTROCHANTRIC;  Surgeon: Shona Needles, MD;  Location: [place];  Service: Orthopedics;  Laterality: Right;   left knee arthroscopy     ORIF HUMERUS FRACTURE  12/09/2011   Procedure: OPEN REDUCTION INTERNAL FIXATION (ORIF) DISTAL HUMERUS FRACTURE;  Surgeon: Linna Hoff, MD;  Location: [facility];  Service: Orthopedics;  Laterality: Right;   OTHER SURGICAL HISTORY     left small toe bone spur removed    TONSILLECTOMY      Family History  Problem Relation Age of Onset   Heart Problems Mother    Heart attack Maternal Grandmother        died from a "heart attack"   Stroke Neg Hx    Arrhythmia Neg Hx     Social History:  reports that she quit smoking about 12 years ago. Her smoking use included cigarettes. She has never used smokeless tobacco. She reports that she does not drink alcohol and does not use drugs.  Allergies:  Allergies  Allergen Reactions   Sulfa Antibiotics Hives, Diarrhea, Nausea And Vomiting and  Swelling    Facial swelling ALSO   Latex Rash    NOT ON MAR    Medications: I have reviewed the patient's current medications.  Results for orders placed or performed during the hospital encounter of 08/06/22 (from the past 48 hour(s))  MRSA Next Gen by PCR, Nasal     Status: None   Collection Time: 08/06/22  9:57 PM   Specimen: Nasal Mucosa; Nasal Swab  Result Value Ref Range   MRSA by PCR Next Gen NOT DETECTED NOT DETECTED    Comment: (NOTE) The GeneXpert MRSA Assay (FDA approved for NASAL specimens only), is one component of a comprehensive MRSA colonization surveillance program. It is not intended to diagnose MRSA infection nor to guide or monitor treatment for MRSA infections. Test performance is not FDA approved in patients less than 51 years old. Performed at [facility], [place] [address] [place], [place] [address]   Basic metabolic panel     Status: Abnormal   Collection Time: 08/07/22 10:29 AM  Result Value Ref Range   Sodium 136 135 - 145 mmol/L   Potassium 4.5 3.5 - 5.1 mmol/L   Chloride 106 98 - 111 mmol/L   CO2 23 22 - 32 mmol/L   Glucose, Bld 115 (H) 70 -  99 mg/dL    Comment: Glucose reference range applies only to samples taken after fasting for at least 8 hours.   BUN 22 8 - 23 mg/dL   Creatinine, Ser 1.11 (H) 0.44 - 1.00 mg/dL   Calcium 8.5 (L) 8.9 - 10.3 mg/dL   GFR, Estimated 48 (L) >60 mL/min    Comment: (NOTE) Calculated using the CKD-EPI Creatinine Equation (2021)    Anion gap 7 5 - 15    Comment: Performed at [facility] [address] [place], [place] [address]  Magnesium     Status: None   Collection Time: 08/07/22 10:29 AM  Result Value Ref Range   Magnesium 1.7 1.7 - 2.4 mg/dL    Comment: Performed at [facility] [address] [place], [place] [address]  CBC     Status: Abnormal   Collection Time: 08/07/22 11:13 AM  Result Value Ref Range   WBC 9.6 4.0 - 10.5 K/uL   RBC 3.59 (L) 3.87 - 5.11 MIL/uL   Hemoglobin 10.8  (L) 12.0 - 15.0 g/dL   HCT 34.1 (L) 36.0 - 46.0 %   MCV 95.0 80.0 - 100.0 fL   MCH 30.1 26.0 - 34.0 pg   MCHC 31.7 30.0 - 36.0 g/dL   RDW 12.5 11.5 - 15.5 %   Platelets 206 150 - 400 K/uL   nRBC 0.0 0.0 - 0.2 %    Comment: Performed at [facility], [place] [address] [place], [place] [address]    DG Elbow 2 Views Right  Result Date: 08/06/2022 CLINICAL DATA:  Fall, dementia EXAM: RIGHT ELBOW - 2 VIEW COMPARISON:  02/08/2022 FINDINGS: Redemonstrated postoperative findings of extensive plate and screw fixation of the distal humerus as well as the olecranon. No evident perihardware fracture or component malpositioning. No obvious elbow joint effusion on lateral view performed in extension. Cast material about the elbow. IMPRESSION: 1. Redemonstrated postoperative findings of extensive plate and screw fixation of the distal humerus as well as the olecranon. No evident perihardware fracture or component malpositioning. 2. No obvious elbow joint effusion on limited lateral view performed in extension. Electronically Signed   By: Delanna Ahmadi M.D.   On: 08/06/2022 16:58    Review of Systems  Unable to perform ROS: Dementia   Blood pressure (!) 136/48, pulse 73, temperature 98.1 F (36.7 C), temperature source Oral, resp. rate 17, height 5\' 2"  (1.575 m), weight 68 kg, SpO2 96 %. Physical Exam Constitutional:      General: She is not in acute distress.    Appearance: She is well-developed. She is not diaphoretic.  HENT:     Head: Normocephalic.  Eyes:     General: No scleral icterus.       Right eye: No discharge.        Left eye: No discharge.  Cardiovascular:     Rate and Rhythm: Normal rate and regular rhythm.  Pulmonary:     Effort: Pulmonary effort is normal. No respiratory distress.  Musculoskeletal:     Cervical back: Normal range of motion.     Comments: Bilateral shoulder, elbow, wrist, digits- no skin wounds, left shoulder TTP, sling bilaterally, sugar tong on right, no  instability, no blocks to motion  Sens  Ax/R/M/U intact  Mot   Ax/ R/ PIN/ M/ AIN/ U grossly intact  Fingers perfused  Skin:    General: Skin is warm and dry.  Neurological:     Mental Status: She is alert.  Psychiatric:        Mood and Affect: Mood normal.        Behavior: Behavior normal.     Assessment/Plan: Right wrist fx -- Plan non-operative management in sugar tong splint. NWB, may WBAT through elbow Left humerus fx -- Non-operative management with sling, NWB. F/u with Dr. Doreatha Martin in 2 weeks.    Lisette Abu, PA-C Orthopedic Surgery [phone] 08/08/2022, 9:35 AM

## 2022-08-09 DIAGNOSIS — W19XXXA Unspecified fall, initial encounter: Secondary | ICD-10-CM | POA: Diagnosis not present

## 2022-08-09 DIAGNOSIS — S52501A Unspecified fracture of the lower end of right radius, initial encounter for closed fracture: Secondary | ICD-10-CM | POA: Diagnosis not present

## 2022-08-09 DIAGNOSIS — R000 Tachycardia, unspecified: Secondary | ICD-10-CM | POA: Diagnosis not present

## 2022-08-09 DIAGNOSIS — N3000 Acute cystitis without hematuria: Secondary | ICD-10-CM | POA: Diagnosis not present

## 2022-08-09 MED ORDER — FENTANYL CITRATE PF 50 MCG/ML IJ SOSY
25.0000 ug | PREFILLED_SYRINGE | Freq: Once | INTRAMUSCULAR | Status: AC
  Administered 2022-08-09: 25 ug via INTRAVENOUS
  Filled 2022-08-09: qty 1

## 2022-08-09 MED ORDER — OXYCODONE HCL 5 MG PO TABS: 5.0000 mg | ORAL_TABLET | Freq: Four times a day (QID) | ORAL | 0 refills | Status: AC | PRN

## 2022-08-09 MED ORDER — OXYCODONE HCL 5 MG PO TABS: 5.0000 mg | ORAL_TABLET | Freq: Four times a day (QID) | ORAL | Status: DC | PRN

## 2022-08-09 MED ORDER — FENTANYL CITRATE PF 50 MCG/ML IJ SOSY
25.0000 ug | PREFILLED_SYRINGE | Freq: Once | INTRAMUSCULAR | Status: DC
  Filled 2022-08-09: qty 1

## 2022-08-09 NOTE — TOC Progression Note (Signed)
Transition of Care [facility]) - Initial/Assessment Note    Patient Details  Name: [name] MRN: [id number] Date of Birth: 11-07-1932  Transition of Care [facility]) CM/SW Contact:    Ralene Bathe, LCSWA Phone Number: 08/09/2022, 9:11 AM  Clinical Narrative:                 9:10- CSW contacted [facility] to inquire about the facility's ability to accept the patient and is awaiting a response.         Patient Goals and CMS Choice        Expected Discharge Plan and Services           Expected Discharge Date: 08/09/22                                    Prior Living Arrangements/Services                       Activities of Daily Living Home Assistive Devices/Equipment: None ADL Screening (condition at time of admission) Patient's cognitive ability adequate to safely complete daily activities?: No Is the patient deaf or have difficulty hearing?: No Does the patient have difficulty seeing, even when wearing glasses/contacts?: No Does the patient have difficulty concentrating, remembering, or making decisions?: Yes Patient able to express need for assistance with ADLs?: Yes Does the patient have difficulty dressing or bathing?: Yes Independently performs ADLs?: No Communication: Independent Dressing (OT): Dependent Is this a change from baseline?: Pre-admission baseline Grooming: Dependent Is this a change from baseline?: Pre-admission baseline Feeding: Dependent Is this a change from baseline?: Pre-admission baseline Bathing: Dependent Is this a change from baseline?: Pre-admission baseline Toileting: Dependent Is this a change from baseline?: Pre-admission baseline In/Out Bed: Dependent Is this a change from baseline?: Pre-admission baseline Walks in Home: Dependent Is this a change from baseline?: Pre-admission baseline Does the patient have difficulty walking or climbing stairs?: Yes Weakness of Legs: Both Weakness of Arms/Hands:  Both  Permission Sought/Granted                  Emotional Assessment              Admission diagnosis:  Acute cystitis without hematuria [N30.00] Contusion of face, initial encounter [S00.83XA] Fall, initial encounter [W19.XXXA] Other closed nondisplaced fracture of proximal end of left humerus, initial encounter [S42.295A] Closed fracture of distal end of right radius, unspecified fracture morphology, initial encounter [S52.501A] Acute cystitis [N30.00] Patient Active Problem List   Diagnosis Date Noted   Acute cystitis 08/07/2022   Acute cystitis without hematuria 08/06/2022   Scalp hematoma 08/06/2022   Fall 08/06/2022   Distal radial fracture 08/06/2022   Hypertensive urgency 08/06/2022   Paroxysmal atrial fibrillation (HCC) 08/06/2022   Chronic anticoagulation 08/06/2022   Acute on chronic renal insufficiency 08/06/2022   (HFpEF) heart failure with preserved ejection fraction (HCC) 08/06/2022   Closed right hip fracture, initial encounter (HCC) 09/26/2021   Head trauma 09/26/2021   Hypocalcemia 09/26/2021   Hyperglycemia 09/26/2021   Acute metabolic encephalopathy 08/10/2021   COVID-19 08/09/2021   History of syncope 08/13/2019   Syncope and collapse 01/23/2019   Leukocytosis 01/23/2019   Diarrhea 01/23/2019   Prolonged QT interval 01/23/2019   Acute confusional state 06/19/2015   Memory loss 06/19/2015   DM II (diabetes mellitus, type II), controlled (HCC) 05/14/2015   Hyperlipidemia 05/14/2015   Essential hypertension, benign  TIA (transient ischemic attack) 05/13/2015   Anemia, iron deficiency 12/12/2011   Fracture of proximal end of left humerus 12/11/2011   Hypoxia 12/11/2011   Wide-complex tachycardia 12/11/2011   Acute hypotension 12/11/2011   PCP:  Mayra Neer, MD Pharmacy:   [facility] [place], [place] [address] Phone:  [phone] Fax: [phone]  [id number] - [place], [place] [address] [place] [address] Phone: [phone] Fax: [phone]     Social Determinants of Health (SDOH) Interventions    Readmission Risk Interventions     No data to display

## 2022-08-09 NOTE — Progress Notes (Signed)
Physical Therapy Treatment Patient Details Name: [name] MRN: [id number] DOB: April 27, 1932 Today's Date: 08/09/2022   History of Present Illness Pt is an 88 y.o. female who presented 08/06/22 s/p fall in which she sustained a scalp hematoma, mildly impacted distal right radial metaphyseal fracture, and proximal left humeral fracture. Pt also found to have UTI and acute hypertensive urgency. PMH includes CKD, Alzheimer's, HLD, HTN, osteoporosis, DM, OA, CVA, PAF    PT Comments    Patient limited by bil UE pain despite premedication for pain. Bed linens wet on arrival and able to convince pt to sit and stand at EOB for changing linens. See below for details.    Recommendations for follow up therapy are one component of a multi-disciplinary discharge planning process, led by the attending physician.  Recommendations may be updated based on patient status, additional functional criteria and insurance authorization.  Follow Up Recommendations  Skilled nursing-short term rehab (<3 hours/day) Can patient physically be transported by private vehicle: No   Assistance Recommended at Discharge Frequent or constant Supervision/Assistance  Patient can return home with the following Two people to help with walking and/or transfers;A lot of help with bathing/dressing/bathroom;Assistance with cooking/housework;Direct supervision/assist for medications management;Direct supervision/assist for financial management;Assist for transportation;Help with stairs or ramp for entrance   Equipment Recommendations  Other (comment) (defer to next venue of care)    Recommendations for Other Services       Precautions / Restrictions Precautions Precautions: Fall Required Braces or Orthoses: Sling;Splint/Cast Splint/Cast: sugar tong splint on RUE, sling LUE Restrictions Weight Bearing Restrictions: Yes RUE Weight Bearing: Weight bear through elbow only LUE Weight Bearing: Non weight bearing Other  Position/Activity Restrictions: WB orders per Dr. Alanda Slim 9/17     Mobility  Bed Mobility Overal bed mobility: Needs Assistance Bed Mobility: Sit to Supine, Supine to Sit     Supine to sit: Max assist, +2 for physical assistance Sit to supine: Max assist, +2 for physical assistance   General bed mobility comments: use of pad for helicopter technique, minimal initiation by pt to assist other than move her legs towards EOB and then begin descent of trunk with return to supine.    Transfers Overall transfer level: Needs assistance Equipment used: 1 person hand held assist (pad under hips) Transfers: Sit to/from Stand Sit to Stand: Max assist, +2 physical assistance           General transfer comment: pt bracing legs against back of bed; unable to achieve full upright despite use of pad under hips (~2/3 standing)    Ambulation/Gait               General Gait Details: unable   Stairs             Wheelchair Mobility    Modified Rankin (Stroke Patients Only)       Balance Overall balance assessment: Needs assistance Sitting-balance support: Feet supported Sitting balance-Leahy Scale: Poor Sitting balance - Comments: sits EOB with min guard-min A Postural control: Posterior lean Standing balance support: During functional activity Standing balance-Leahy Scale: Poor Standing balance comment: bracing legs against back of bed for support                            Cognition Arousal/Alertness: Awake/alert Behavior During Therapy: WFL for tasks assessed/performed Overall Cognitive Status: History of cognitive impairments - at baseline  General Comments: hx of dementia, oriented to self and DOB at baseline        Exercises      General Comments        Pertinent Vitals/Pain Pain Assessment Pain Assessment: Faces Faces Pain Scale: Hurts worst Pain Location: LUE/RUE/back with movement Pain  Descriptors / Indicators: Discomfort, Grimacing, Crying, Moaning Pain Intervention(s): Limited activity within patient's tolerance, Monitored during session, Premedicated before session    Home Living                          Prior Function            PT Goals (current goals can now be found in the care plan section) Acute Rehab PT Goals Patient Stated Goal: to lay down end of session Time For Goal Achievement: 08/21/22 Potential to Achieve Goals: Fair Progress towards PT goals: Not progressing toward goals - comment (pain limiting, along with confusion)    Frequency    Min 2X/week      PT Plan Current plan remains appropriate    Co-evaluation              AM-PAC PT "6 Clicks" Mobility   Outcome Measure  Help needed turning from your back to your side while in a flat bed without using bedrails?: Total Help needed moving from lying on your back to sitting on the side of a flat bed without using bedrails?: Total Help needed moving to and from a bed to a chair (including a wheelchair)?: Total Help needed standing up from a chair using your arms (e.g., wheelchair or bedside chair)?: Total Help needed to walk in hospital room?: Total Help needed climbing 3-5 steps with a railing? : Total 6 Click Score: 6    End of Session Equipment Utilized During Treatment: Gait belt Activity Tolerance: Patient limited by pain Patient left: in bed;with call bell/phone within reach;with bed alarm set Nurse Communication: Mobility status PT Visit Diagnosis: Unsteadiness on feet (R26.81);Other abnormalities of gait and mobility (R26.89);Muscle weakness (generalized) (M62.81);History of falling (Z91.81);Difficulty in walking, not elsewhere classified (R26.2);Pain Pain - part of body: Arm     Time: 3532-9924 PT Time Calculation (min) (ACUTE ONLY): 22 min  Charges:  $Therapeutic Activity: 8-22 mins                     .lyn   Rexanne Mano 08/09/2022, 11:25 AM

## 2022-08-09 NOTE — TOC Transition Note (Signed)
Transition of Care [facility]) - CM/SW Discharge Note   Patient Details  Name: [name] MRN: [id number] Date of Birth: 01/10/1932  Transition of Care [facility]) CM/SW Contact:  Milinda Antis, [place] [facility] Phone Number: 08/09/2022, 12:06 PM   Clinical Narrative:    Patient will DC to:   [place]  Anticipated DC date:  08/09/2022  Family notified:  Yes Transport by:  Corey Harold   Per MD patient ready for DC to . RN to call report prior to discharge [phone] room 702P). RN, patient's family, previous facility, and new facility notified of DC. Discharge Summary and FL2 sent to facility. DC packet on chart. Ambulance transport will be requested for patient.   CSW will sign off for now as social work intervention is no longer needed. Please consult [place] again if new needs arise.     Final next level of care: Skilled Nursing Facility Barriers to Discharge: No Barriers Identified   Patient Goals and CMS Choice        Discharge Placement              Patient chooses bed at:  [facility]) Patient to be transferred to facility by: [place] Name of family member notified: Noni Saupe (Daughter)   [phone] Patient and family notified of of transfer: 08/09/22  Discharge Plan and Services                                     Social Determinants of Health (SDOH) Interventions     Readmission Risk Interventions     No data to display

## 2022-08-09 NOTE — Discharge Summary (Signed)
Physician Discharge Summary  Alleria Beerman Schrader [name] DOB: 16-Feb-1932 DOA: 08/06/2022  PCP: Mayra Neer, MD  Admit date: 08/06/2022 Discharge date: 08/09/2022 Admitted From: ALF Disposition: SNF Recommendations for Outpatient Follow-up:  Follow up with PCP in 1 week Follow-up with orthopedic surgery in 2 weeks Check BMP and CBC in 1 to 2 weeks Reassess risk and benefit of anticoagulation given fall risk Please follow up on the following pending results: None   Discharge Condition: Stable CODE STATUS: DNR/DNI  Contact information for follow-up providers     Haddix, Thomasene Lot, MD. Schedule an appointment as soon as possible for a visit in 2 week(s).   Specialty: Orthopedic Surgery Contact information: [place] [address] [phone]              Contact information for after-discharge care     Destination     [facility]erred [facility] .   Service: Skilled Nursing Contact information: [facility] course 88 year old F with PMH of Alzheimer's dementia, CVA, diastolic CHF, paroxysmal A-fib on Eliquis, DM-2, CKD-3A, osteoarthritis, osteoporosis and anemia brought to ED from ALF after she fell and broke her arms.  Reportedly hit her head and had severe left upper arm, shoulder and right wrist pain.  Imaging showed acute distal right radial, acute left humeral and old pubic rami fracture.  Head, cervical spine and maxillofacial CT showed large left frontal scalp hematoma without acute fracture or intracranial process.  Patient had sling for left humeral fracture and sugar-tong splint for right distal radial fracture.  UA concerning for UTI.  Cultures obtained.  She was started on IV antibiotics and admitted for acute cystitis with hematuria, and hypertensive urgency.  The next day, orthopedic surgery, Dr. Doreatha Martin consulted and recommended nonoperative  management in sugar-tong splint, WBAT through elbow but NWB below elbow for right wrist fracture, sling and NWB for left upper extremity and outpatient follow-up in 2 weeks.   In regards to UTI, urine culture grew pansensitive Klebsiella pneumonia except to ampicillin.  Antibiotic de-escalated to IV Ancef.  She is discharged on p.o. cefadroxil 500 mg twice daily for 3 more days to complete a total of 5 days course.  Patient also on Eliquis and low-dose aspirin prior to presentation.  She is on Eliquis for A-fib.  I do not see clear indication for low-dose aspirin.  She has history of TIA but no history of CAD, PAD or CVA.  Patient has significant risk for CVA with CHA2DS2-VASc score > 5.  She is also high risk for fall.  Patient's daughter reports about 4 falls in the last 12 months.  After risk-benefit discussion, patient's daughter prefers to continue low-dose Eliquis and discontinue aspirin.  We have also discontinued Norco to reduce risk of fall.  Further discussion per PCP.  Therapy recommended SNF.  See individual problem list below for more.   Problems addressed during this hospitalization Principal Problem:   Acute cystitis without hematuria Active Problems:   Fracture of proximal end of left humerus   Scalp hematoma   Fall   Distal radial fracture   Leukocytosis   Hypertensive urgency   Paroxysmal atrial fibrillation (HCC)   Chronic anticoagulation   Acute on chronic renal insufficiency   (HFpEF) heart failure with preserved ejection fraction ([place])   DM II (diabetes mellitus, type II), controlled (  [place])   Wide-complex tachycardia   Hyperlipidemia   Acute cystitis   Acute cystitis with hematuria due to Klebsiella pneumoniae: Based on urinalysis and urine culture since patient is not a reliable historian.  She also have leukocytosis but no fever.  Hemodynamically stable.  Urine culture with with pansensitive Klebsiella pneumonia except to ampicillin. -IV ceftriaxone>> IV Ancef>>  p.o. cefadroxil 500 mg twice daily for 3 more days to complete 5 days total   Accidental fall at ALF: Likely from not using her walker. Acute distal right radial metaphyseal fracture can [place] to fall Acute proximal left humerus fracture secondary to fall Acute scalp hematoma secondary to fall Per orthopedic surgery- Right wrist fx -- Plan non-operative management in sugar tong splint. NWB, may WBAT through elbow Left humerus fx -- Non-operative management with sling, NWB. F/u with Dr. Doreatha Martin in 2 weeks. -Tylenol 1 g 3 times daily as needed pain  -Oxycodone 5 mg every 8 hours for moderate to severe pain -Bowel regimen   Uncontrolled hypertension: BP elevated to 183/73 on arrival.  This could be pain related.  Normotensive -Continue metoprolol   Paroxysmal A-fib on Eliquis: CHA2DS2-VASc score at least 6.  However, patient is high risk for fall.  -Plan to continue low-dose Eliquis after discussion with patient's daughter.  Discontinued aspirin -Continue metoprolol for rate control   CKD-3A: Creatinine slightly higher than baseline but improved.  CK normal. Recent Labs    09/26/21 0742 09/28/21 0518 09/29/21 0213 09/30/21 0209 11/08/21 1258 02/08/22 2005 08/06/22 0247 08/06/22 0400 08/07/22 1029 08/08/22 0831  BUN 12 21 19 17 12 21  34* 29* 22 18  CREATININE 0.96 1.28* 1.07* 0.83 1.05* 1.02* 1.20* 1.29* 1.11* 0.90  -Avoid nephrotoxic meds -Recheck renal function in 1 to 2 weeks   Chronic diastolic CHF: TTE in 12/9935 with LVEF of 60 to 65% and G1-DD.  Appears euvolemic on exam.  Not on diuretics at home.   Controlled NIDDM-2: A1c 5.5% in 07/2021.  Does not seem to be on medication at home.   Alzheimer's dementia without behavioral disturbance: Seems to be at baseline.  She is awake and oriented to self, place but not time.  Follows commands. -Delirium precaution -Continue home Namenda.   History of wide-complex tachycardia:  -Continue beta-blocker   Depression:  Stable -Continue Zoloft   Hyperlipidemia -Continue simvastatin   Osteoporosis -Outpatient follow-up.  Family communication: Updated patient's daughter over the phone on the day of discharge.           Vital signs Vitals:   08/08/22 1740 08/08/22 2029 08/09/22 0526 08/09/22 0800  BP: (!) 137/52 (!) 149/57 (!) 153/52 (!) 148/55  Pulse: 70 78 67 71  Temp: 98.2 F (36.8 C) 99 F (37.2 C) 98.9 F (37.2 C) 99.1 F (37.3 C)  Resp: 18 16 18 17   Height:      Weight:      SpO2: 93% 96% 97% 96%  TempSrc: Oral  Oral Oral  BMI (Calculated):         Discharge exam  GENERAL: No apparent distress.  Nontoxic. HEENT: MMM.  Bruising in the face.  Hematoma over left frontal head NECK: Supple.  No apparent JVD.  RESP:  No IWOB.  Fair aeration bilaterally. CVS:  RRR. Heart sounds normal.  ABD/GI/GU: BS+. Abd soft, NTND.  MSK/EXT:  Moves extremities.  Sugar splint to right forearm.  Left arm in a sling. SKIN: Facial bruise and hematoma as above. NEURO: Awake and alert. Oriented to self and place.  Follows commands.  No apparent focal neuro deficit. PSYCH: Calm. Normal affect.   Discharge Instructions Discharge Instructions     Diet general   Complete by: As directed    Increase activity slowly   Complete by: As directed    No wound care   Complete by: As directed       Allergies as of 08/09/2022       Reactions   Sulfa Antibiotics Hives, Diarrhea, Nausea And Vomiting, Swelling   Facial swelling ALSO   Latex Rash   NOT ON MAR        Medication List     STOP taking these medications    aspirin EC 81 MG tablet   HYDROcodone-acetaminophen 5-325 MG tablet Commonly known as: NORCO/VICODIN       TAKE these medications    acetaminophen 500 MG tablet Commonly known as: TYLENOL Take 2 tablets (1,000 mg total) by mouth every 8 (eight) hours as needed. What changed:  when to take this reasons to take this   apixaban 2.5 MG Tabs tablet Commonly known as:  ELIQUIS Take 1 tablet (2.5 mg total) by mouth 2 (two) times daily.   Aspercreme Lidocaine 4 % Crea Generic drug: Lidocaine HCl Apply 1 Application topically every 12 (twelve) hours.   Biofreeze Professional 5 % Gel Generic drug: Menthol (Topical Analgesic) Apply 1 application topically in the morning, at noon, and at bedtime. Apply on right knee   cefadroxil 500 MG capsule Commonly known as: DURICEF Take 1 capsule (500 mg total) by mouth 2 (two) times daily for 3 days.   cyanocobalamin 1000 MCG tablet Take 1,000 mcg by mouth daily.   feeding supplement Liqd Take 237 mLs by mouth 2 (two) times daily between meals.   ferrous gluconate 324 MG tablet Commonly known as: FERGON Take 324 mg by mouth daily with breakfast.   melatonin 3 MG Tabs tablet Take 3 mg by mouth at bedtime.   memantine 10 MG tablet Commonly known as: NAMENDA Take 10 mg by mouth 2 (two) times daily.   metoprolol tartrate 25 MG tablet Commonly known as: LOPRESSOR Take 0.5 tablets (12.5 mg total) by mouth 2 (two) times daily.   oxyCODONE 5 MG immediate release tablet Commonly known as: Oxy IR/ROXICODONE Take 1 tablet (5 mg total) by mouth every 6 (six) hours as needed for up to 5 days for severe pain or moderate pain.   polyethylene glycol 17 g packet Commonly known as: MIRALAX / GLYCOLAX Take 17 g by mouth daily as needed for mild constipation.   senna-docusate 8.6-50 MG tablet Commonly known as: Senokot-S Take 1 tablet by mouth 2 (two) times daily. What changed:  how much to take when to take this   sertraline 100 MG tablet Commonly known as: ZOLOFT Take 100 mg by mouth at bedtime.   simvastatin 20 MG tablet Commonly known as: ZOCOR Take 1 tablet (20 mg total) by mouth daily at 6 PM.   TUMS CHEWY BITES PO Take 200 mg by mouth in the morning and at bedtime.   VITAMIN D2 PO Take 1,250 mcg by mouth once a week. monday        Consultations: Orthopedic  surgery  Procedures/Studies:   DG Elbow 2 Views Right  Result Date: 08/06/2022 CLINICAL DATA:  Fall, dementia EXAM: RIGHT ELBOW - 2 VIEW COMPARISON:  02/08/2022 FINDINGS: Redemonstrated postoperative findings of extensive plate and screw fixation of the distal humerus as well as the olecranon. No evident perihardware fracture or component malpositioning.  No obvious elbow joint effusion on lateral view performed in extension. Cast material about the elbow. IMPRESSION: 1. Redemonstrated postoperative findings of extensive plate and screw fixation of the distal humerus as well as the olecranon. No evident perihardware fracture or component malpositioning. 2. No obvious elbow joint effusion on limited lateral view performed in extension. Electronically Signed   By: Delanna Ahmadi M.D.   On: 08/06/2022 16:58   CT Head Wo Contrast  Result Date: 08/06/2022 CLINICAL DATA:  Facial trauma EXAM: CT HEAD WITHOUT CONTRAST CT MAXILLOFACIAL WITHOUT CONTRAST CT CERVICAL SPINE WITHOUT CONTRAST TECHNIQUE: Multidetector CT imaging of the head, cervical spine, and maxillofacial structures were performed using the standard protocol without intravenous contrast. Multiplanar CT image reconstructions of the cervical spine and maxillofacial structures were also generated. RADIATION DOSE REDUCTION: This exam was performed according to the departmental dose-optimization program which includes automated exposure control, adjustment of the mA and/or kV according to patient size and/or use of iterative reconstruction technique. COMPARISON:  08/03/2022 head CT FINDINGS: CT HEAD FINDINGS Brain: There is no mass, hemorrhage or extra-axial collection. There is generalized atrophy without lobar predilection. There is hypoattenuation of the periventricular white matter, most commonly indicating chronic ischemic microangiopathy. Old left cerebellar and occipital infarcts Vascular: No abnormal hyperdensity of the major intracranial arteries or  dural venous sinuses. No intracranial atherosclerosis. Skull: Large left frontal scalp hematoma.  No skull fracture. CT MAXILLOFACIAL FINDINGS Osseous: --Complex facial fracture types: No LeFort, zygomaticomaxillary complex or nasoorbitoethmoidal fracture. --Simple fracture types: None. --Mandible: No fracture or dislocation. Orbits: The globes are intact. Normal appearance of the intra- and extraconal fat. Symmetric extraocular muscles and optic nerves. Sinuses: No fluid levels or advanced mucosal thickening. Soft tissues: Normal visualized extracranial soft tissues. CT CERVICAL SPINE FINDINGS Alignment: No static subluxation. Facets are aligned. Occipital condyles and the lateral masses of C1-C2 are aligned. Skull base and vertebrae: No acute fracture. Soft tissues and spinal canal: No prevertebral fluid or swelling. No visible canal hematoma. Disc levels: No advanced spinal canal or neural foraminal stenosis. Upper chest: No pneumothorax, pulmonary nodule or pleural effusion. Other: Normal visualized paraspinal cervical soft tissues. IMPRESSION: 1. No acute intracranial abnormality. 2. Large left frontal scalp hematoma without skull fracture or facial fracture. 3. No acute fracture or static subluxation of the cervical spine. Electronically Signed   By: Ulyses Jarred M.D.   On: 08/06/2022 03:49   CT Cervical Spine Wo Contrast  Result Date: 08/06/2022 CLINICAL DATA:  Facial trauma EXAM: CT HEAD WITHOUT CONTRAST CT MAXILLOFACIAL WITHOUT CONTRAST CT CERVICAL SPINE WITHOUT CONTRAST TECHNIQUE: Multidetector CT imaging of the head, cervical spine, and maxillofacial structures were performed using the standard protocol without intravenous contrast. Multiplanar CT image reconstructions of the cervical spine and maxillofacial structures were also generated. RADIATION DOSE REDUCTION: This exam was performed according to the departmental dose-optimization program which includes automated exposure control, adjustment of  the mA and/or kV according to patient size and/or use of iterative reconstruction technique. COMPARISON:  08/03/2022 head CT FINDINGS: CT HEAD FINDINGS Brain: There is no mass, hemorrhage or extra-axial collection. There is generalized atrophy without lobar predilection. There is hypoattenuation of the periventricular white matter, most commonly indicating chronic ischemic microangiopathy. 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 No intracranial atherosclerosis. Skull: Large left frontal scalp hematoma.  No skull fracture. CT MAXILLOFACIAL FINDINGS Osseous: --Complex facial fracture types: No LeFort, zygomaticomaxillary complex or nasoorbitoethmoidal fracture. --Simple fracture types: None. --Mandible:  No fracture or dislocation. Orbits: The globes are intact. Normal appearance of the intra- and extraconal fat. Symmetric extraocular muscles and optic nerves. Sinuses: No fluid levels or advanced mucosal thickening. Soft tissues: Normal visualized extracranial soft tissues. CT CERVICAL SPINE FINDINGS Alignment: No static subluxation. Facets are aligned. Occipital condyles and the lateral masses of C1-C2 are aligned. Skull base and vertebrae: No acute fracture. Soft tissues and spinal canal: No prevertebral fluid or swelling. No visible canal hematoma. Disc levels: No advanced spinal canal or neural foraminal stenosis. Upper chest: No pneumothorax, pulmonary nodule or pleural effusion. Other: Normal visualized paraspinal cervical soft tissues. IMPRESSION: 1. No acute intracranial abnormality. 2. Large left frontal scalp hematoma without skull fracture or facial fracture. 3. No acute fracture or static subluxation of the cervical spine. Electronically Signed   By: Ulyses Jarred M.D.   On: 08/06/2022 03:49   CT Maxillofacial WO CM  Result Date: 08/06/2022 CLINICAL DATA:  Facial trauma EXAM: CT HEAD WITHOUT CONTRAST CT  MAXILLOFACIAL WITHOUT CONTRAST CT CERVICAL SPINE WITHOUT CONTRAST TECHNIQUE: Multidetector CT imaging of the head, cervical spine, and maxillofacial structures were performed using the standard protocol without intravenous contrast. Multiplanar CT image reconstructions of the cervical spine and maxillofacial structures were also generated. RADIATION DOSE REDUCTION: This exam was performed according to the departmental dose-optimization program which includes automated exposure control, adjustment of the mA and/or kV according to patient size and/or use of iterative reconstruction technique. COMPARISON:  08/03/2022 head CT FINDINGS: CT HEAD FINDINGS Brain: There is no mass, hemorrhage or extra-axial collection. There is generalized atrophy without lobar predilection. There is hypoattenuation of the periventricular white matter, most commonly indicating chronic ischemic microangiopathy. 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 No intracranial atherosclerosis. Skull: Large left frontal scalp hematoma.  No skull fracture. CT MAXILLOFACIAL FINDINGS Osseous: --Complex facial fracture types: No LeFort, zygomaticomaxillary complex or nasoorbitoethmoidal fracture. --Simple fracture types: None. --Mandible: No fracture or dislocation. Orbits: The globes are intact. Normal appearance of the intra- and extraconal fat. Symmetric extraocular muscles and optic nerves. Sinuses: No fluid levels or advanced mucosal thickening. Soft tissues: Normal visualized extracranial soft tissues. CT CERVICAL SPINE FINDINGS Alignment: No static subluxation. Facets are aligned. Occipital condyles and the lateral masses of C1-C2 are aligned. Skull base and vertebrae: No acute fracture. Soft tissues and spinal canal: No prevertebral fluid or swelling. No visible canal hematoma. Disc levels: No advanced spinal canal or neural foraminal stenosis. Upper  chest: No pneumothorax, pulmonary nodule or pleural effusion. Other: Normal visualized paraspinal cervical soft tissues. IMPRESSION: 1. No acute intracranial abnormality. 2. Large left frontal scalp hematoma without skull fracture or facial fracture. 3. No acute fracture or static subluxation of the cervical spine. Electronically Signed   By: Ulyses Jarred M.D.   On: 08/06/2022 03:49   DG Chest Port 1 View  Result Date: 08/06/2022 CLINICAL DATA:  Recent fall with chest pain, initial encounter EXAM: PORTABLE CHEST 1 VIEW COMPARISON:  02/08/2022 FINDINGS: Cardiac shadow is within normal limits. Lungs are well aerated bilaterally. No focal infiltrate or sizable effusion is seen. Left proximal humeral fracture is again noted. IMPRESSION: Proximal left humeral fracture. No acute intrathoracic abnormality. Electronically Signed   By: Inez Catalina M.D.   On: 08/06/2022 03:09   DG Pelvis Portable  Result Date: 08/06/2022 CLINICAL DATA:  Recent fall with pelvic pain, initial encounter EXAM: PORTABLE PELVIS 1-2 VIEWS COMPARISON:  08/03/2022 FINDINGS: Pelvic ring is intact.  Prior fracture in the proximal right femur is noted with surgical fixation. Old pubic rami fractures are noted and stable on the right. Proximal left femur appears within normal limits. Soft tissue abnormality is noted. IMPRESSION: Changes consistent with prior fractures on the right stable from the prior exam. No acute abnormality noted. Electronically Signed   By: Inez Catalina M.D.   On: 08/06/2022 03:08   DG Shoulder Left Portable  Result Date: 08/06/2022 CLINICAL DATA:  Recent fall with left shoulder pain, initial encounter EXAM: LEFT SHOULDER COMPARISON:  None Available. FINDINGS: There is a mildly impacted fracture involving the proximal left humerus primarily in the surgical neck with extension into the greater tuberosity. No dislocation is noted. No other fracture is seen. IMPRESSION: Proximal left humeral fracture as described.  Electronically Signed   By: Inez Catalina M.D.   On: 08/06/2022 03:04   DG Wrist Complete Right  Result Date: 08/06/2022 CLINICAL DATA:  Recent fall with right wrist pain, initial encounter EXAM: RIGHT WRIST - COMPLETE 3+ VIEW COMPARISON:  None Available. FINDINGS: There is a mildly impacted fracture in the distal right radial metaphysis. No significant displacement or angulation is noted. Distal ulna appears within normal limits. Visualized portions of the right hand are unremarkable. IMPRESSION: Mildly impacted distal right radial metaphyseal fracture. Electronically Signed   By: Inez Catalina M.D.   On: 08/06/2022 03:03   DG Wrist Complete Right  Result Date: 08/03/2022 CLINICAL DATA:  88 year old female status post fall from bed.  Pain. EXAM: RIGHT WRIST - COMPLETE 3+ VIEW COMPARISON:  Right hand series 09/24/2021. FINDINGS: Three views of the right wrist. Distal radius and ulna appear intact. Radiocarpal joint space loss, but carpal bone alignment maintained. No carpal bone fracture identified. First CMC joint space loss. Metacarpals appear intact. Partially visible proximal radius or ulna or IF hardware. IMPRESSION: No acute fracture or dislocation identified about the right wrist. Electronically Signed   By: Genevie Ann M.D.   On: 08/03/2022 06:40   DG Knee Complete 4 Views Left  Result Date: 08/03/2022 CLINICAL DATA:  Fall EXAM: LEFT KNEE - COMPLETE 4+ VIEW COMPARISON:  None Available. FINDINGS: Mild medial compartment osteoarthrosis. No acute fracture or dislocation. No joint effusion. IMPRESSION: No acute fracture or dislocation of the left knee. Mild medial compartment osteoarthrosis. Electronically Signed   By: Ulyses Jarred M.D.   On: 08/03/2022 04:01   DG Hip Unilat W or Wo Pelvis 2-3 Views Left  Result Date: 08/03/2022 CLINICAL DATA:  Unwitnessed fall EXAM: DG HIP (WITH OR WITHOUT PELVIS) 2-3V LEFT COMPARISON:  Radiographs 02/08/2022 FINDINGS: Intramedullary nail fixation of the proximal  right femur. Old healed intertrochanteric right femur fracture. Irregularity about the right superior and inferior pubic rami are likely due to remote trauma. No definite acute fracture. Degenerative changes pubic symphysis, both hips, SI joints and lower lumbar spine. IMPRESSION: No acute fracture or dislocation. Electronically Signed   By: Placido Sou M.D.   On: 08/03/2022 04:01   CT HEAD WO CONTRAST (5MM)  Result Date: 08/03/2022 CLINICAL DATA:  Neck trauma EXAM: CT HEAD WITHOUT CONTRAST CT CERVICAL SPINE WITHOUT CONTRAST TECHNIQUE: Multidetector CT imaging of the head and cervical spine was performed following the standard protocol without intravenous contrast. Multiplanar CT image reconstructions of the cervical spine were also generated. RADIATION DOSE REDUCTION: This exam was performed according to the departmental dose-optimization program which includes automated exposure control, adjustment of the mA and/or kV according to patient size and/or use of iterative reconstruction technique.  COMPARISON:  None Available. FINDINGS: CT HEAD FINDINGS Brain: There is no mass, hemorrhage or extra-axial collection. There is generalized atrophy without lobar predilection. There is hypoattenuation of the periventricular white matter, most commonly indicating chronic ischemic microangiopathy. 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 Vascular: No abnormal hyperdensity of the major intracranial arteries or dural venous sinuses. No intracranial atherosclerosis. Skull: Large left frontal scalp hematoma without skull fracture. Sinuses/Orbits: No fluid levels or advanced mucosal thickening of the visualized paranasal sinuses. No mastoid or middle ear effusion. The orbits are normal. CT CERVICAL SPINE FINDINGS Alignment: No static subluxation. Facets are aligned. Occipital condyles are normally positioned. Skull base and vertebrae: No acute fracture. Soft tissues and spinal canal: No prevertebral fluid or  swelling. No visible canal hematoma. Disc levels: No advanced spinal canal or neural foraminal stenosis. Upper chest: No pneumothorax, pulmonary nodule or pleural effusion. Other: Normal visualized paraspinal cervical soft tissues. IMPRESSION: 1. No acute intracranial abnormality. 2. Large left frontal scalp hematoma without skull fracture. 3. No acute fracture or static subluxation of the cervical spine. Electronically Signed   By: Ulyses Jarred M.D.   On: 08/03/2022 03:52   CT CERVICAL SPINE WO CONTRAST  Result Date: 08/03/2022 CLINICAL DATA:  Neck trauma EXAM: CT HEAD WITHOUT CONTRAST CT CERVICAL SPINE WITHOUT CONTRAST TECHNIQUE: Multidetector CT imaging of the head and cervical spine was performed following the standard protocol without intravenous contrast. Multiplanar CT image reconstructions of the cervical spine were also generated. RADIATION DOSE REDUCTION: This exam was performed according to the departmental dose-optimization program which includes automated exposure control, adjustment of the mA and/or kV according to patient size and/or use of iterative reconstruction technique. COMPARISON:  None Available. FINDINGS: CT HEAD FINDINGS Brain: There is no mass, hemorrhage or extra-axial collection. There is generalized atrophy without lobar predilection. There is hypoattenuation of the periventricular white matter, most commonly indicating chronic ischemic microangiopathy. 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 Vascular: No abnormal hyperdensity of the major intracranial arteries or dural venous sinuses. No intracranial atherosclerosis. Skull: Large left frontal scalp hematoma without skull fracture. Sinuses/Orbits: No fluid levels or advanced mucosal thickening of the visualized paranasal sinuses. No mastoid or middle ear effusion. The orbits are normal. CT CERVICAL SPINE FINDINGS Alignment: No static subluxation. Facets are aligned. Occipital condyles are normally positioned. Skull base and  vertebrae: No acute fracture. Soft tissues and spinal canal: No prevertebral fluid or swelling. No visible canal hematoma. Disc levels: No advanced spinal canal or neural foraminal stenosis. Upper chest: No pneumothorax, pulmonary nodule or pleural effusion. Other: Normal visualized paraspinal cervical soft tissues. IMPRESSION: 1. No acute intracranial abnormality. 2. Large left frontal scalp hematoma without skull fracture. 3. No acute fracture or static subluxation of the cervical spine. Electronically Signed   By: Ulyses Jarred M.D.   On: 08/03/2022 03:52       The results of significant diagnostics from this hospitalization (including imaging, microbiology, ancillary and laboratory) are listed below for reference.     Microbiology: Recent Results (from the past 240 hour(s))  Urine Culture     Status: Abnormal   Collection Time: 08/06/22  5:16 AM   Specimen: Urine, Clean Catch  Result Value Ref Range Status   Specimen Description URINE, CLEAN CATCH  Final   Special Requests   Final    NONE Performed at [facility], [address] [place], [place] [address]    Culture >=100,000 COLONIES/mL KLEBSIELLA PNEUMONIAE (A)  Final   Report Status 08/08/2022 FINAL  Final   Organism ID, Bacteria KLEBSIELLA PNEUMONIAE (A)  Final      Susceptibility   Klebsiella pneumoniae - MIC*    AMPICILLIN RESISTANT Resistant     CEFAZOLIN <=4 SENSITIVE Sensitive     CEFEPIME <=0.12 SENSITIVE Sensitive     CEFTRIAXONE <=0.25 SENSITIVE Sensitive     CIPROFLOXACIN <=0.25 SENSITIVE Sensitive     GENTAMICIN <=1 SENSITIVE Sensitive     IMIPENEM <=0.25 SENSITIVE Sensitive     NITROFURANTOIN 32 SENSITIVE Sensitive     TRIMETH/SULFA <=20 SENSITIVE Sensitive     AMPICILLIN/SULBACTAM <=2 SENSITIVE Sensitive     PIP/TAZO <=4 SENSITIVE Sensitive     * >=100,000 COLONIES/mL KLEBSIELLA PNEUMONIAE  MRSA Next Gen by PCR, Nasal     Status: None   Collection Time: 08/06/22  9:57 PM   Specimen: Nasal Mucosa;  Nasal Swab  Result Value Ref Range Status   MRSA by PCR Next Gen NOT DETECTED NOT DETECTED Final    Comment: (NOTE) The GeneXpert MRSA Assay (FDA approved for NASAL specimens only), is one component of a comprehensive MRSA colonization surveillance program. It is not intended to diagnose MRSA infection nor to guide or monitor treatment for MRSA infections. Test performance is not FDA approved in patients less than 14 years old. Performed at [facility], [place] [address], [place],  [address]      Labs:  CBC: Recent Labs  Lab 08/06/22 0247 08/06/22 0400 08/07/22 1113 08/08/22 0831  WBC  --  12.1* 9.6 8.8  NEUTROABS  --  8.9*  --   --   HGB 12.6 12.0 10.8* 10.2*  HCT 37.0 38.4 34.1* 32.2*  MCV  --  94.6 95.0 94.4  PLT  --  253 206 218   BMP &GFR Recent Labs  Lab 08/06/22 0247 08/06/22 0400 08/07/22 1029 08/08/22 0831  NA 138 139 136 140  K 4.2 3.8 4.5 4.3  CL 106 104 106 104  CO2  --  23 23 26   GLUCOSE 116* 120* 115* 143*  BUN 34* 29* 22 18  CREATININE 1.20* 1.29* 1.11* 0.90  CALCIUM  --  9.2 8.5* 8.9  MG  --   --  1.7 1.6*  PHOS  --   --   --  3.6   Estimated Creatinine Clearance: 39.1 mL/min (by C-G formula based on SCr of 0.9 mg/dL). Liver & Pancreas: Recent Labs  Lab 08/06/22 0400 08/08/22 0831  AST 19  --   ALT 13  --   ALKPHOS 92  --   BILITOT 0.9  --   PROT 6.4*  --   ALBUMIN 3.6 2.8*   No results for input(s): "LIPASE", "AMYLASE" in the last 168 hours. No results for input(s): "AMMONIA" in the last 168 hours. Diabetic: No results for input(s): "HGBA1C" in the last 72 hours. No results for input(s): "GLUCAP" in the last 168 hours. Cardiac Enzymes: Recent Labs  Lab 08/06/22 0400  CKTOTAL 130   No results for input(s): "PROBNP" in the last 8760 hours. Coagulation Profile: Recent Labs  Lab 08/06/22 0400  INR 1.2   Thyroid Function Tests: No results for input(s): "TSH", "T4TOTAL", "FREET4", "T3FREE", "THYROIDAB" in the last  72 hours. Lipid Profile: No results for input(s): "CHOL", "HDL", "LDLCALC", "TRIG", "CHOLHDL", "LDLDIRECT" in the last 72 hours. Anemia Panel: No results for input(s): "VITAMINB12", "FOLATE", "FERRITIN", "TIBC", "IRON", "RETICCTPCT" in the last 72 hours. Urine analysis:    Component Value Date/Time   COLORURINE YELLOW 08/06/2022 0516  APPEARANCEUR HAZY (A) 08/06/2022 0516   LABSPEC 1.024 08/06/2022 0516   PHURINE 5.0 08/06/2022 0516   GLUCOSEU NEGATIVE 08/06/2022 0516   HGBUR SMALL (A) 08/06/2022 0516   BILIRUBINUR NEGATIVE 08/06/2022 0516   KETONESUR NEGATIVE 08/06/2022 0516   PROTEINUR NEGATIVE 08/06/2022 0516   UROBILINOGEN 0.2 05/13/2015 1849   NITRITE POSITIVE (A) 08/06/2022 0516   LEUKOCYTESUR LARGE (A) 08/06/2022 0516   Sepsis Labs: Invalid input(s): "PROCALCITONIN", "LACTICIDVEN"   SIGNED:  Mercy Riding, MD  [facility] 08/09/2022, 10:46 AM

## 2023-01-25 ENCOUNTER — Emergency Department (HOSPITAL_COMMUNITY): Payer: Medicare Other

## 2023-01-25 ENCOUNTER — Other Ambulatory Visit: Payer: Self-pay

## 2023-01-25 ENCOUNTER — Encounter (HOSPITAL_COMMUNITY): Payer: Self-pay | Admitting: Emergency Medicine

## 2023-01-25 ENCOUNTER — Inpatient Hospital Stay (HOSPITAL_COMMUNITY)
Admission: EM | Admit: 2023-01-25 | Discharge: 2023-02-06 | DRG: 177 | Disposition: A | Discharge status: Skilled Nursing Facility | Payer: Medicare Other | Source: Skilled Nursing Facility | Attending: Family Medicine | Admitting: Family Medicine

## 2023-01-25 DIAGNOSIS — I4821 Permanent atrial fibrillation: Secondary | ICD-10-CM | POA: Diagnosis present

## 2023-01-25 DIAGNOSIS — Z9181 History of falling: Secondary | ICD-10-CM

## 2023-01-25 DIAGNOSIS — Z7901 Long term (current) use of anticoagulants: Secondary | ICD-10-CM

## 2023-01-25 DIAGNOSIS — J1282 Pneumonia due to coronavirus disease 2019: Secondary | ICD-10-CM | POA: Diagnosis present

## 2023-01-25 DIAGNOSIS — Z87891 Personal history of nicotine dependence: Secondary | ICD-10-CM

## 2023-01-25 DIAGNOSIS — Z882 Allergy status to sulfonamides status: Secondary | ICD-10-CM

## 2023-01-25 DIAGNOSIS — E119 Type 2 diabetes mellitus without complications: Secondary | ICD-10-CM

## 2023-01-25 DIAGNOSIS — N1831 Chronic kidney disease, stage 3a: Secondary | ICD-10-CM | POA: Diagnosis present

## 2023-01-25 DIAGNOSIS — Z79890 Hormone replacement therapy (postmenopausal): Secondary | ICD-10-CM

## 2023-01-25 DIAGNOSIS — Z8781 Personal history of (healed) traumatic fracture: Secondary | ICD-10-CM

## 2023-01-25 DIAGNOSIS — I5032 Chronic diastolic (congestive) heart failure: Secondary | ICD-10-CM | POA: Diagnosis present

## 2023-01-25 DIAGNOSIS — F02B0 Dementia in other diseases classified elsewhere, moderate, without behavioral disturbance, psychotic disturbance, mood disturbance, and anxiety: Secondary | ICD-10-CM | POA: Diagnosis present

## 2023-01-25 DIAGNOSIS — I480 Paroxysmal atrial fibrillation: Secondary | ICD-10-CM | POA: Diagnosis present

## 2023-01-25 DIAGNOSIS — L89131 Pressure ulcer of right lower back, stage 1: Secondary | ICD-10-CM | POA: Diagnosis present

## 2023-01-25 DIAGNOSIS — U071 COVID-19: Secondary | ICD-10-CM | POA: Diagnosis not present

## 2023-01-25 DIAGNOSIS — Z8616 Personal history of COVID-19: Secondary | ICD-10-CM

## 2023-01-25 DIAGNOSIS — G309 Alzheimer's disease, unspecified: Secondary | ICD-10-CM | POA: Diagnosis present

## 2023-01-25 DIAGNOSIS — Z79899 Other long term (current) drug therapy: Secondary | ICD-10-CM

## 2023-01-25 DIAGNOSIS — R531 Weakness: Secondary | ICD-10-CM

## 2023-01-25 DIAGNOSIS — Z91040 Latex allergy status: Secondary | ICD-10-CM

## 2023-01-25 DIAGNOSIS — I130 Hypertensive heart and chronic kidney disease with heart failure and stage 1 through stage 4 chronic kidney disease, or unspecified chronic kidney disease: Secondary | ICD-10-CM | POA: Diagnosis present

## 2023-01-25 DIAGNOSIS — I10 Essential (primary) hypertension: Secondary | ICD-10-CM | POA: Diagnosis present

## 2023-01-25 DIAGNOSIS — J9601 Acute respiratory failure with hypoxia: Secondary | ICD-10-CM | POA: Diagnosis not present

## 2023-01-25 DIAGNOSIS — M810 Age-related osteoporosis without current pathological fracture: Secondary | ICD-10-CM | POA: Diagnosis present

## 2023-01-25 DIAGNOSIS — Z66 Do not resuscitate: Secondary | ICD-10-CM | POA: Diagnosis present

## 2023-01-25 DIAGNOSIS — N179 Acute kidney failure, unspecified: Secondary | ICD-10-CM | POA: Diagnosis not present

## 2023-01-25 DIAGNOSIS — Z8673 Personal history of transient ischemic attack (TIA), and cerebral infarction without residual deficits: Secondary | ICD-10-CM

## 2023-01-25 DIAGNOSIS — Z8249 Family history of ischemic heart disease and other diseases of the circulatory system: Secondary | ICD-10-CM

## 2023-01-25 DIAGNOSIS — E1122 Type 2 diabetes mellitus with diabetic chronic kidney disease: Secondary | ICD-10-CM | POA: Diagnosis present

## 2023-01-25 MED ORDER — SODIUM CHLORIDE 0.9 % IV BOLUS
1000.0000 mL | Freq: Once | INTRAVENOUS | Status: AC
  Administered 2023-01-25: 1000 mL via INTRAVENOUS

## 2023-01-25 NOTE — ED Triage Notes (Signed)
Patient BIB EMS from SNF ([facility]) c/o Weakness. Per report pt got more weaker than normal. Per report daughter wants patient to be seen in the hospital. No report of N/V. Pt hx dementia.  CBG 127 BP 156/78 HR 80 RR 20 O2sat 99% on RA

## 2023-01-25 NOTE — ED Provider Notes (Signed)
[facility] Provider Note   CSN: [id number] Arrival date & time: 01/25/23  2132     History {Add pertinent medical, surgical, social history, OB history to HPI:1} Chief Complaint  Patient presents with   Weakness    [name] is a 89 y.o. female.  Level 5 caveat for dementia.  Patient sent from her facility with generalized weakness over the past several days.  She denies any pain does not know why she is here.  Denies any fall.  No headache, chest pain, shortness of breath, abdominal pain, nausea or vomiting.  She is unable to give a history secondary to her dementia.  PMH of Alzheimer's dementia, CVA, diastolic CHF, paroxysmal A-fib on Eliquis, DM-2, CKD-3A, osteoarthritis, osteoporosis and anemia   She denies any cough, fever, runny nose, sore throat, abdominal pain, vomiting, pain with urination or blood in the urine.  No chest pain or shortness of breath.  No headache.  No known fever.  The history is provided by the patient and the EMS personnel. The history is limited by the condition of the patient.  Weakness      Home Medications Prior to Admission medications   Medication Sig Start Date End Date Taking? Authorizing Provider  acetaminophen (TYLENOL) 500 MG tablet Take 2 tablets (1,000 mg total) by mouth every 8 (eight) hours as needed. 08/08/22   Mercy Riding, MD  apixaban (ELIQUIS) 2.5 MG TABS tablet Take 1 tablet (2.5 mg total) by mouth 2 (two) times daily. 08/16/21   Little Ishikawa, MD  Calcium Carbonate Antacid (TUMS CHEWY BITES PO) Take 200 mg by mouth in the morning and at bedtime.    [provider]  cyanocobalamin 1000 MCG tablet Take 1,000 mcg by mouth daily.    [provider]  Ergocalciferol (VITAMIN D2 PO) Take 1,250 mcg by mouth once a week. monday    [provider]  feeding supplement (ENSURE ENLIVE / ENSURE PLUS) LIQD Take 237 mLs by mouth 2 (two) times daily between meals.  09/30/21   Terrilee Croak, MD  ferrous gluconate (FERGON) 324 MG tablet Take 324 mg by mouth daily with breakfast.    [provider]  Lidocaine HCl (ASPERCREME LIDOCAINE) 4 % CREA Apply 1 Application topically every 12 (twelve) hours.    [provider]  Melatonin 3 MG TABS Take 3 mg by mouth at bedtime.    [provider]  memantine (NAMENDA) 10 MG tablet Take 10 mg by mouth 2 (two) times daily.    [provider]  Menthol, Topical Analgesic, (BIOFREEZE PROFESSIONAL) 5 % GEL Apply 1 application topically in the morning, at noon, and at bedtime. Apply on right knee    [provider]  metoprolol tartrate (LOPRESSOR) 25 MG tablet Take 0.5 tablets (12.5 mg total) by mouth 2 (two) times daily. 08/16/21   Little Ishikawa, MD  polyethylene glycol (MIRALAX / GLYCOLAX) 17 g packet Take 17 g by mouth daily as needed for mild constipation. 09/30/21   Dahal, Marlowe Aschoff, MD  senna-docusate (SENOKOT-S) 8.6-50 MG tablet Take 1 tablet by mouth 2 (two) times daily. Patient taking differently: Take 2 tablets by mouth at bedtime. 09/30/21   Terrilee Croak, MD  sertraline (ZOLOFT) 100 MG tablet Take 100 mg by mouth at bedtime.    [provider]  simvastatin (ZOCOR) 20 MG tablet Take 1 tablet (20 mg total) by mouth daily at 6 PM. 08/19/21   Little Ishikawa, MD  Allergies    Sulfa antibiotics and Latex    Review of Systems   Review of Systems  Unable to perform ROS: Dementia  Neurological:  Positive for weakness.    Physical Exam Updated Vital Signs BP (!) 152/67   Pulse 78   Temp 98.8 F (37.1 C)   Resp 16   Ht '5\' 2"'$  (1.575 m)   Wt 68 kg   SpO2 95%   BMI 27.42 kg/m  Physical Exam Vitals and nursing note reviewed.  Constitutional:      General: She is not in acute distress.    Appearance: She is well-developed.     Comments: Oriented to person and place.  Feels warm. Mildly increased work of breathing  HENT:     Head: Normocephalic  and atraumatic.     Mouth/Throat:     Pharynx: No oropharyngeal exudate.  Eyes:     Conjunctiva/sclera: Conjunctivae normal.     Pupils: Pupils are equal, round, and reactive to light.  Neck:     Comments: No meningismus. Cardiovascular:     Rate and Rhythm: Normal rate and regular rhythm.     Heart sounds: Normal heart sounds. No murmur heard. Pulmonary:     Effort: Pulmonary effort is normal. No respiratory distress.     Breath sounds: Normal breath sounds.  Chest:     Chest wall: No tenderness.  Abdominal:     Palpations: Abdomen is soft.     Tenderness: There is no abdominal tenderness. There is no guarding or rebound.  Musculoskeletal:        General: No tenderness. Normal range of motion.     Cervical back: Normal range of motion and neck supple.     Comments: FROM hips without pain  Skin:    General: Skin is warm.  Neurological:     Mental Status: She is alert.     Cranial Nerves: No cranial nerve deficit.     Motor: No abnormal muscle tone.     Coordination: Coordination normal.     Comments:  5/5 strength throughout. CN 2-12 intact.Equal grip strength.   Psychiatric:        Behavior: Behavior normal.     ED Results / Procedures / Treatments   Labs (all labs ordered are listed, but only abnormal results are displayed) Labs Reviewed  CULTURE, BLOOD (ROUTINE X 2)  CULTURE, BLOOD (ROUTINE X 2)  RESP PANEL BY RT-PCR (RSV, FLU A&B, COVID)  [id number]  CBC WITH DIFFERENTIAL/PLATELET  COMPREHENSIVE METABOLIC PANEL  LACTIC ACID, PLASMA  LACTIC ACID, PLASMA  CK  TROPONIN I (HIGH SENSITIVITY)    EKG None  Radiology No results found.  Procedures Procedures  {Document cardiac monitor, telemetry assessment procedure when appropriate:1}  Medications Ordered in ED Medications  sodium chloride 0.9 % bolus 1,000 mL (has no administration in time range)    ED Course/ Medical Decision Making/ A&P   {   Click here for ABCD2, HEART and other calculatorsREFRESH Note  before signing :1}                          Medical Decision Making Amount and/or Complexity of Data Reviewed Labs: ordered. Decision-making details documented in ED Course. Radiology: ordered and independent interpretation performed. Decision-making details documented in ED Course. ECG/medicine tests: ordered and independent interpretation performed. Decision-making details documented in ED Course.  From facility with generalized weakness for the past several days.  Vitals are stable.  Borderline O2  saturations in the low 90s.  Denies any shortness of breath.  Denies any cough or fever.  Does feel warm.  Will pursue infectious workup including rectal temperature, chest x-ray and labs.   ED ECG REPORT   Date: 01/25/2023  Rate: 81  Rhythm: normal sinus rhythm  QRS Axis: normal  Intervals: normal  ST/T Wave abnormalities: normal  Conduction Disutrbances:none  Narrative Interpretation:   Old EKG Reviewed: unchanged  I have personally reviewed the EKG tracing and agree with the computerized printout as noted.    {Document review of labs and clinical decision tools ie heart score, Chads2Vasc2 etc:1}  {Document your independent review of radiology images, and any outside records:1} {Document your discussion with family members, caretakers, and with consultants:1} {Document social determinants of health affecting pt's care:1} {Document your decision making why or why not admission, treatments were needed:1} Final Clinical Impression(s) / ED Diagnoses Final diagnoses:  None    Rx / DC Orders ED Discharge Orders     None

## 2023-01-25 NOTE — ED Notes (Signed)
Pt back from CT

## 2023-01-25 NOTE — ED Notes (Signed)
Pt went to CT via stretcher

## 2023-01-26 ENCOUNTER — Encounter (HOSPITAL_COMMUNITY): Payer: Self-pay | Admitting: Internal Medicine

## 2023-01-26 DIAGNOSIS — G309 Alzheimer's disease, unspecified: Secondary | ICD-10-CM | POA: Diagnosis present

## 2023-01-26 DIAGNOSIS — I480 Paroxysmal atrial fibrillation: Secondary | ICD-10-CM | POA: Diagnosis not present

## 2023-01-26 DIAGNOSIS — E1122 Type 2 diabetes mellitus with diabetic chronic kidney disease: Secondary | ICD-10-CM | POA: Diagnosis present

## 2023-01-26 DIAGNOSIS — L89131 Pressure ulcer of right lower back, stage 1: Secondary | ICD-10-CM | POA: Diagnosis present

## 2023-01-26 DIAGNOSIS — J9601 Acute respiratory failure with hypoxia: Secondary | ICD-10-CM | POA: Diagnosis present

## 2023-01-26 DIAGNOSIS — I5032 Chronic diastolic (congestive) heart failure: Secondary | ICD-10-CM | POA: Diagnosis present

## 2023-01-26 DIAGNOSIS — E119 Type 2 diabetes mellitus without complications: Secondary | ICD-10-CM | POA: Diagnosis not present

## 2023-01-26 DIAGNOSIS — Z8249 Family history of ischemic heart disease and other diseases of the circulatory system: Secondary | ICD-10-CM | POA: Diagnosis not present

## 2023-01-26 DIAGNOSIS — R531 Weakness: Secondary | ICD-10-CM

## 2023-01-26 DIAGNOSIS — F02B0 Dementia in other diseases classified elsewhere, moderate, without behavioral disturbance, psychotic disturbance, mood disturbance, and anxiety: Secondary | ICD-10-CM | POA: Diagnosis present

## 2023-01-26 DIAGNOSIS — N1831 Chronic kidney disease, stage 3a: Secondary | ICD-10-CM | POA: Diagnosis present

## 2023-01-26 DIAGNOSIS — Z8616 Personal history of COVID-19: Secondary | ICD-10-CM | POA: Diagnosis not present

## 2023-01-26 DIAGNOSIS — Z9181 History of falling: Secondary | ICD-10-CM | POA: Diagnosis not present

## 2023-01-26 DIAGNOSIS — Z882 Allergy status to sulfonamides status: Secondary | ICD-10-CM | POA: Diagnosis not present

## 2023-01-26 DIAGNOSIS — Z66 Do not resuscitate: Secondary | ICD-10-CM | POA: Diagnosis present

## 2023-01-26 DIAGNOSIS — Z79899 Other long term (current) drug therapy: Secondary | ICD-10-CM | POA: Diagnosis not present

## 2023-01-26 DIAGNOSIS — N179 Acute kidney failure, unspecified: Secondary | ICD-10-CM | POA: Diagnosis not present

## 2023-01-26 DIAGNOSIS — J1282 Pneumonia due to coronavirus disease 2019: Secondary | ICD-10-CM | POA: Diagnosis present

## 2023-01-26 DIAGNOSIS — I10 Essential (primary) hypertension: Secondary | ICD-10-CM | POA: Diagnosis not present

## 2023-01-26 DIAGNOSIS — Z91040 Latex allergy status: Secondary | ICD-10-CM | POA: Diagnosis not present

## 2023-01-26 DIAGNOSIS — U071 COVID-19: Secondary | ICD-10-CM | POA: Diagnosis present

## 2023-01-26 DIAGNOSIS — Z8781 Personal history of (healed) traumatic fracture: Secondary | ICD-10-CM | POA: Diagnosis not present

## 2023-01-26 DIAGNOSIS — I4821 Permanent atrial fibrillation: Secondary | ICD-10-CM | POA: Diagnosis present

## 2023-01-26 DIAGNOSIS — I130 Hypertensive heart and chronic kidney disease with heart failure and stage 1 through stage 4 chronic kidney disease, or unspecified chronic kidney disease: Secondary | ICD-10-CM | POA: Diagnosis present

## 2023-01-26 DIAGNOSIS — Z7901 Long term (current) use of anticoagulants: Secondary | ICD-10-CM | POA: Diagnosis not present

## 2023-01-26 DIAGNOSIS — M810 Age-related osteoporosis without current pathological fracture: Secondary | ICD-10-CM | POA: Diagnosis present

## 2023-01-26 DIAGNOSIS — Z8673 Personal history of transient ischemic attack (TIA), and cerebral infarction without residual deficits: Secondary | ICD-10-CM | POA: Diagnosis not present

## 2023-01-26 DIAGNOSIS — Z87891 Personal history of nicotine dependence: Secondary | ICD-10-CM | POA: Diagnosis not present

## 2023-01-26 LAB — RESP PANEL BY RT-PCR (RSV, FLU A&B, COVID)  RVPGX2
Influenza A by PCR: NEGATIVE
Influenza B by PCR: NEGATIVE
Resp Syncytial Virus by PCR: NEGATIVE
SARS Coronavirus 2 by RT PCR: POSITIVE — AB

## 2023-01-26 LAB — CBC WITH DIFFERENTIAL/PLATELET
Abs Immature Granulocytes: 0.02 10*3/uL (ref 0.00–0.07)
Abs Immature Granulocytes: 0.02 10*3/uL (ref 0.00–0.07)
Basophils Absolute: 0 10*3/uL (ref 0.0–0.1)
Basophils Absolute: 0 10*3/uL (ref 0.0–0.1)
Basophils Relative: 1 %
Basophils Relative: 1 %
Eosinophils Absolute: 0 10*3/uL (ref 0.0–0.5)
Eosinophils Absolute: 0 10*3/uL (ref 0.0–0.5)
Eosinophils Relative: 0 %
Eosinophils Relative: 0 %
HCT: 42.6 % (ref 36.0–46.0)
HCT: 42.8 % (ref 36.0–46.0)
Hemoglobin: 13.6 g/dL (ref 12.0–15.0)
Hemoglobin: 14 g/dL (ref 12.0–15.0)
Immature Granulocytes: 0 %
Immature Granulocytes: 0 %
Lymphocytes Relative: 20 %
Lymphocytes Relative: 23 %
Lymphs Abs: 1.2 10*3/uL (ref 0.7–4.0)
Lymphs Abs: 1.6 10*3/uL (ref 0.7–4.0)
MCH: 28.8 pg (ref 26.0–34.0)
MCH: 29.2 pg (ref 26.0–34.0)
MCHC: 31.9 g/dL (ref 30.0–36.0)
MCHC: 32.7 g/dL (ref 30.0–36.0)
MCV: 89.2 fL (ref 80.0–100.0)
MCV: 90.1 fL (ref 80.0–100.0)
Monocytes Absolute: 0.3 10*3/uL (ref 0.1–1.0)
Monocytes Absolute: 1.1 10*3/uL — ABNORMAL HIGH (ref 0.1–1.0)
Monocytes Relative: 16 %
Monocytes Relative: 5 %
Neutro Abs: 4.3 10*3/uL (ref 1.7–7.7)
Neutro Abs: 4.5 10*3/uL (ref 1.7–7.7)
Neutrophils Relative %: 60 %
Neutrophils Relative %: 74 %
Platelets: 167 10*3/uL (ref 150–400)
Platelets: 187 10*3/uL (ref 150–400)
RBC: 4.73 MIL/uL (ref 3.87–5.11)
RBC: 4.8 MIL/uL (ref 3.87–5.11)
RDW: 13.2 % (ref 11.5–15.5)
RDW: 13.2 % (ref 11.5–15.5)
WBC: 6 10*3/uL (ref 4.0–10.5)
WBC: 7.1 10*3/uL (ref 4.0–10.5)
nRBC: 0 % (ref 0.0–0.2)
nRBC: 0 % (ref 0.0–0.2)

## 2023-01-26 LAB — COMPREHENSIVE METABOLIC PANEL
ALT: 13 U/L (ref 0–44)
ALT: 13 U/L (ref 0–44)
AST: 21 U/L (ref 15–41)
AST: 22 U/L (ref 15–41)
Albumin: 3.8 g/dL (ref 3.5–5.0)
Albumin: 4 g/dL (ref 3.5–5.0)
Alkaline Phosphatase: 87 U/L (ref 38–126)
Alkaline Phosphatase: 90 U/L (ref 38–126)
Anion gap: 10 (ref 5–15)
Anion gap: 9 (ref 5–15)
BUN: 17 mg/dL (ref 8–23)
BUN: 18 mg/dL (ref 8–23)
CO2: 23 mmol/L (ref 22–32)
CO2: 25 mmol/L (ref 22–32)
Calcium: 8.6 mg/dL — ABNORMAL LOW (ref 8.9–10.3)
Calcium: 8.7 mg/dL — ABNORMAL LOW (ref 8.9–10.3)
Chloride: 100 mmol/L (ref 98–111)
Chloride: 102 mmol/L (ref 98–111)
Creatinine, Ser: 1.14 mg/dL — ABNORMAL HIGH (ref 0.44–1.00)
Creatinine, Ser: 1.18 mg/dL — ABNORMAL HIGH (ref 0.44–1.00)
GFR, Estimated: 44 mL/min — ABNORMAL LOW (ref >60)
GFR, Estimated: 46 mL/min — ABNORMAL LOW (ref >60)
Glucose, Bld: 119 mg/dL — ABNORMAL HIGH (ref 70–99)
Glucose, Bld: 140 mg/dL — ABNORMAL HIGH (ref 70–99)
Potassium: 3.7 mmol/L (ref 3.5–5.1)
Potassium: 4 mmol/L (ref 3.5–5.1)
Sodium: 133 mmol/L — ABNORMAL LOW (ref 135–145)
Sodium: 136 mmol/L (ref 135–145)
Total Bilirubin: 0.7 mg/dL (ref 0.3–1.2)
Total Bilirubin: 0.8 mg/dL (ref 0.3–1.2)
Total Protein: 7.1 g/dL (ref 6.5–8.1)
Total Protein: 7.1 g/dL (ref 6.5–8.1)

## 2023-01-26 LAB — GLUCOSE, CAPILLARY
Glucose-Capillary: 154 mg/dL — ABNORMAL HIGH (ref 70–99)
Glucose-Capillary: 202 mg/dL — ABNORMAL HIGH (ref 70–99)

## 2023-01-26 LAB — TROPONIN I (HIGH SENSITIVITY)
Troponin I (High Sensitivity): 10 ng/L (ref <18)
Troponin I (High Sensitivity): 9 ng/L (ref <18)

## 2023-01-26 LAB — PROCALCITONIN: Procalcitonin: <0.1 ng/mL

## 2023-01-26 LAB — PHOSPHORUS: Phosphorus: 4.4 mg/dL (ref 2.5–4.6)

## 2023-01-26 LAB — LACTIC ACID, PLASMA
Lactic Acid, Venous: 1.1 mmol/L (ref 0.5–1.9)
Lactic Acid, Venous: 1.3 mmol/L (ref 0.5–1.9)

## 2023-01-26 LAB — MAGNESIUM
Magnesium: 1.8 mg/dL (ref 1.7–2.4)
Magnesium: 2.1 mg/dL (ref 1.7–2.4)

## 2023-01-26 LAB — BLOOD GAS, VENOUS
Acid-Base Excess: 1.5 mmol/L (ref 0.0–2.0)
Bicarbonate: 27.7 mmol/L (ref 20.0–28.0)
O2 Saturation: 81.6 %
Patient temperature: 37
pCO2, Ven: 49 mmHg (ref 44–60)
pH, Ven: 7.36 (ref 7.25–7.43)
pO2, Ven: 49 mmHg — ABNORMAL HIGH (ref 32–45)

## 2023-01-26 LAB — C-REACTIVE PROTEIN: CRP: 5.7 mg/dL — ABNORMAL HIGH (ref <1.0)

## 2023-01-26 LAB — TSH: TSH: 1.607 u[IU]/mL (ref 0.350–4.500)

## 2023-01-26 LAB — CK: Total CK: 198 U/L (ref 38–234)

## 2023-01-26 LAB — BRAIN NATRIURETIC PEPTIDE: B Natriuretic Peptide: 69.4 pg/mL (ref 0.0–100.0)

## 2023-01-26 MED ORDER — PREDNISONE 50 MG PO TABS
50.0000 mg | ORAL_TABLET | Freq: Every day | ORAL | Status: DC
  Administered 2023-01-29 – 2023-02-01 (×4): 50 mg via ORAL
  Filled 2023-01-26 (×4): qty 1

## 2023-01-26 MED ORDER — MEMANTINE HCL 10 MG PO TABS
10.0000 mg | ORAL_TABLET | Freq: Two times a day (BID) | ORAL | Status: DC
  Administered 2023-01-26 – 2023-02-06 (×23): 10 mg via ORAL
  Filled 2023-01-26 (×23): qty 1

## 2023-01-26 MED ORDER — BENZONATATE 100 MG PO CAPS: 200.0000 mg | ORAL_CAPSULE | Freq: Three times a day (TID) | ORAL | Status: DC | PRN

## 2023-01-26 MED ORDER — LINAGLIPTIN 5 MG PO TABS
5.0000 mg | ORAL_TABLET | Freq: Every day | ORAL | Status: DC
  Administered 2023-01-26 – 2023-02-06 (×12): 5 mg via ORAL
  Filled 2023-01-26 (×12): qty 1

## 2023-01-26 MED ORDER — ALBUTEROL SULFATE (2.5 MG/3ML) 0.083% IN NEBU: 2.5000 mg | INHALATION_SOLUTION | RESPIRATORY_TRACT | Status: DC | PRN

## 2023-01-26 MED ORDER — DEXAMETHASONE SODIUM PHOSPHATE 10 MG/ML IJ SOLN
10.0000 mg | Freq: Once | INTRAMUSCULAR | Status: AC
  Administered 2023-01-26: 10 mg via INTRAVENOUS
  Filled 2023-01-26: qty 1

## 2023-01-26 MED ORDER — METOPROLOL TARTRATE 12.5 MG HALF TABLET
12.5000 mg | ORAL_TABLET | Freq: Two times a day (BID) | ORAL | Status: DC
  Administered 2023-01-26 – 2023-02-06 (×18): 12.5 mg via ORAL
  Filled 2023-01-26 (×23): qty 1

## 2023-01-26 MED ORDER — SERTRALINE HCL 100 MG PO TABS
100.0000 mg | ORAL_TABLET | Freq: Every day | ORAL | Status: DC
  Administered 2023-01-26 – 2023-02-05 (×11): 100 mg via ORAL
  Filled 2023-01-26 (×11): qty 1

## 2023-01-26 MED ORDER — SODIUM CHLORIDE 0.9 % IV SOLN
200.0000 mg | Freq: Once | INTRAVENOUS | Status: AC
  Administered 2023-01-26: 200 mg via INTRAVENOUS
  Filled 2023-01-26: qty 40

## 2023-01-26 MED ORDER — APIXABAN 2.5 MG PO TABS
2.5000 mg | ORAL_TABLET | Freq: Two times a day (BID) | ORAL | Status: DC
  Administered 2023-01-26 – 2023-02-06 (×23): 2.5 mg via ORAL
  Filled 2023-01-26 (×23): qty 1

## 2023-01-26 MED ORDER — ACETAMINOPHEN 325 MG PO TABS: 650.0000 mg | ORAL_TABLET | Freq: Four times a day (QID) | ORAL | Status: DC | PRN

## 2023-01-26 MED ORDER — MELATONIN 3 MG PO TABS: 3.0000 mg | ORAL_TABLET | Freq: Every evening | ORAL | Status: DC | PRN

## 2023-01-26 MED ORDER — SODIUM CHLORIDE 0.9 % IV SOLN
100.0000 mg | Freq: Every day | INTRAVENOUS | Status: AC
  Administered 2023-01-27 – 2023-01-28 (×2): 100 mg via INTRAVENOUS
  Filled 2023-01-26 (×2): qty 20

## 2023-01-26 MED ORDER — INSULIN ASPART 100 UNIT/ML IJ SOLN
0.0000 [IU] | Freq: Three times a day (TID) | INTRAMUSCULAR | Status: DC
  Administered 2023-01-26 – 2023-02-02 (×5): 1 [IU] via SUBCUTANEOUS
  Filled 2023-01-26: qty 0.06

## 2023-01-26 MED ORDER — ACETAMINOPHEN 650 MG RE SUPP: 650.0000 mg | Freq: Four times a day (QID) | RECTAL | Status: DC | PRN

## 2023-01-26 MED ORDER — METHYLPREDNISOLONE SODIUM SUCC 40 MG IJ SOLR
0.5000 mg/kg | Freq: Two times a day (BID) | INTRAMUSCULAR | Status: AC
  Administered 2023-01-26 – 2023-01-28 (×6): 34 mg via INTRAVENOUS
  Filled 2023-01-26 (×6): qty 1

## 2023-01-26 NOTE — TOC Initial Note (Addendum)
Transition of Care [facility]) - Initial/Assessment Note    Patient Details  Name: [name] MRN: [id number] Date of Birth: 28-Sep-1932  Transition of Care [facility] CM/SW Contact:    Vassie Moselle, LCSW Phone Number: 01/26/2023, 11:40 AM  Clinical Narrative:                 Spoke with pt's daughter via t/c who shares that this pt currently resides at [facility]. She was receiving home health PT however, pt graduated from home health at the end of February. Pt typically able to walk independently at baseline.   TOC will continue to follow for discharge recommendations.    Expected Discharge Plan: Assisted Living Barriers to Discharge: No Barriers Identified   Patient Goals and CMS Choice Patient states their goals for this hospitalization and ongoing recovery are:: For pt to return to ALF CMS Medicare.gov Compare Post Acute Care list provided to:: Patient Represenative (must comment) Choice offered to / list presented to : Adult [place] ownership interest in [facility].provided to:: Adult Children    Expected Discharge Plan and Services In-house Referral: Clinical Social Work Discharge Planning Services: NA Post Acute Care Choice: Resumption of Svcs/PTA Provider Living arrangements for the past 2 months: [place]                 DME Arranged: N/A DME Agency: NA                  Prior Living Arrangements/Services Living arrangements for the past 2 months: [place] Lives with:: Facility Resident Patient language and need for interpreter reviewed:: Yes Do you feel safe going back to the place where you live?: Yes      Need for Family Participation in Patient Care: Yes (Comment) Care giver support system in place?: Yes (comment)   Criminal Activity/Legal Involvement Pertinent to Current Situation/Hospitalization: No - Comment as needed  Activities of Daily Living      Permission Sought/Granted Permission  sought to share information with : Facility Art therapist granted to share information with : Yes, Verbal Permission Granted     Permission granted to share info w AGENCY: [facility]        Emotional Assessment   Attitude/Demeanor/Rapport: Unable to Assess Affect (typically observed): Unable to Assess Orientation: : Oriented to Self, Oriented to Place Alcohol / Substance Use: Not Applicable Psych Involvement: No (comment)  Admission diagnosis:  Acute respiratory failure with hypoxia ([place]) [J96.01] COVID-19 virus infection [U07.1] COVID-19 [U07.1] Patient Active Problem List   Diagnosis Date Noted   COVID-19 virus infection 01/26/2023   Acute respiratory failure with hypoxia ([facility]) 01/26/2023   Generalized weakness 01/26/2023   Chronic diastolic CHF (congestive heart failure) ([place]) 01/26/2023   Acute cystitis 08/07/2022   Acute cystitis without hematuria 08/06/2022   Scalp hematoma 08/06/2022   Fall 08/06/2022   Distal radial fracture 08/06/2022   Hypertensive urgency 08/06/2022   Paroxysmal atrial fibrillation ([place]) 08/06/2022   Chronic anticoagulation 08/06/2022   Acute on chronic renal insufficiency 08/06/2022   (HFpEF) heart failure with preserved ejection fraction ([place]) 08/06/2022   Closed right hip fracture, initial encounter ([place]) 09/26/2021   Head trauma 09/26/2021   Hypocalcemia 09/26/2021   Hyperglycemia [id number]   Acute metabolic encephalopathy [id number]   COVID-19 08/09/2021   History of syncope 08/13/2019   Syncope and collapse 01/23/2019   Leukocytosis 01/23/2019   Diarrhea 01/23/2019   Prolonged QT interval 01/23/2019   Acute confusional state  06/19/2015   Memory loss 06/19/2015   DM2 (diabetes mellitus, type 2) ([place]) 05/14/2015   Hyperlipidemia 05/14/2015   Essential hypertension    TIA (transient ischemic attack) 05/13/2015   Anemia, iron deficiency 12/12/2011   Fracture of proximal end of left humerus 12/11/2011   Hypoxia  12/11/2011   Wide-complex tachycardia 12/11/2011   Acute hypotension 12/11/2011   PCP:  Mayra Neer, MD Pharmacy:   [facility] [place], [place] [place] [address] Phone: [phone] Fax: [phone]  [id number] [id number] [id number]: [phone]: [phone]    Social Determinants of Health (SDOH) Social History: SDOH Screenings   Tobacco Use: Medium Risk (01/26/2023)   SDOH Interventions:     Readmission Risk Interventions    01/26/2023   11:35 AM  Readmission Risk Prevention Plan  Transportation Screening Complete  PCP or Specialist Appt within 5-7 Days Complete  Home Care Screening Complete  Medication Review (RN CM) Complete

## 2023-01-26 NOTE — Progress Notes (Signed)
[facility]                                                                              [name], is a 89 y.o. female, DOB - [birth year], [id number] Admit date - 01/25/2023    Outpatient Primary MD for the patient is Mayra Neer, MD  LOS - 0  days  Chief Complaint  Patient presents with   Weakness       Brief summary   Patient is a 89 year old female with dementia, DM type II, HTN, paroxysmal A-fib, on Eliquis, chronic diastolic CHF presented to ED with generalized weakness.  Patient currently resides at [facility] and staff noted that patient was having weakness worsening over the last 2-3 days associated with new onset productive cough and fevers.  No focal weakness.  No shortness of breath, chest pain, diaphoresis or any syncope. In ED, noted to have temp of 100.1 F, HR 70-89, BP 133/92, RR 21-24, noted to have hypoxia in ED, 88% on room air and was placed on 2 L O2 with improvement in sats to 95%. Creatinine 1.18, CK 198, COVID-19 PCR positive. RSV, flu negative.   Assessment & Plan    Principal Problem: Acute respiratory failure with hypoxia secondary to COVID-19 virus infection -Patient noted to have new hypoxia, not on O2 at baseline. -BNP 198, troponin 9, lactic acid 1.1, procalcitonin < 0.1, CRP pending.  Chest x-ray showed no acute infiltrates -started on IV remdesivir and Solu-Medrol. -Wean O2 as tolerated.   Active Problems: Generalized weakness, debility -Likely due to #1, continue management for COVID, PT OT    Paroxysmal atrial fibrillation (HCC) -HR stable, continue metoprolol -On chronic anticoagulation with Eliquis, will continue    DM2 (diabetes mellitus, type 2) (HCC) -CBG stable, HbA1c pending -Continue sensitive sliding scale insulin, Tradjenta    Essential hypertension -Continue metoprolol, BP stable    Chronic diastolic CHF (congestive heart failure) (HCC) -Currently compensated, not on diuretics. -2D echo 07/2021 with  EF of 60 to 65%, G1 DD  History of CKD stage IIIa -Creatinine currently at baseline 0.9-1.2  Dementia -Continue on Namenda, Zoloft   Estimated body mass index is 27.42 kg/m as calculated from the following:   Height as of this encounter: '5\' 2"'$  (1.575 m).   Weight as of this encounter: 68 kg.  Code Status: DNR DVT Prophylaxis:  apixaban (ELIQUIS) tablet 2.5 mg Start: 01/26/23 0800 SCDs Start: 01/26/23 0236 apixaban (ELIQUIS) tablet 2.5 mg   Level of Care: Level of care: Telemetry Family Communication: Updated patient Disposition Plan:      Remains inpatient appropriate: Ongoing treatment of acute COVID-19 preclude safe discharge to SNF   Procedures:    Consultants:   None  Antimicrobials:   Anti-infectives (From admission, onward)    Start     Dose/Rate Route Frequency Ordered Stop   01/27/23 1000  remdesivir 100 mg in sodium chloride 0.9 % 100 mL IVPB       See Hyperspace for full Linked Orders Report.   100 mg 200 mL/hr over 30 Minutes Intravenous Daily 01/26/23 0238 01/29/23 0959   01/26/23 0330  remdesivir 200 mg in sodium  chloride 0.9% 250 mL IVPB       See Hyperspace for full Linked Orders Report.   200 mg 580 mL/hr over 30 Minutes Intravenous Once 01/26/23 0238 01/26/23 0404          Medications  apixaban  2.5 mg Oral BID   insulin aspart  0-6 Units Subcutaneous TID WC   linagliptin  5 mg Oral Daily   memantine  10 mg Oral BID   methylPREDNISolone (SOLU-MEDROL) injection  0.5 mg/kg Intravenous Q12H   Followed by   Derrill Memo ON 01/29/2023] predniSONE  50 mg Oral Daily   metoprolol tartrate  12.5 mg Oral BID      Subjective:   [name] was seen and examined today.  No acute complaints, on O2, 2 L.  No chest pain, fevers, cough.  Has dementia.  Difficult to obtain ROS from the patient.  Very weak and deconditioned. Objective:   Vitals:   01/26/23 0217 01/26/23 0230 01/26/23 0324 01/26/23 0742  BP:  (!) 174/80 (!) 159/67 129/66  Pulse: 80 77  78 67  Resp: 20 14 (!) 24 16  Temp: 98.3 F (36.8 C)  98.9 F (37.2 C) 99 F (37.2 C)  TempSrc: Oral  Oral Oral  SpO2: 96% 97% 95% 95%  Weight:      Height:        Intake/Output Summary (Last 24 hours) at 01/26/2023 1040 Last data filed at 01/26/2023 0500 Gross per 24 hour  Intake 1289 ml  Output --  Net 1289 ml     Wt Readings from Last 3 Encounters:  01/25/23 68 kg  08/06/22 68 kg  08/03/22 68 kg     Exam General: Alert and oriented x self and place, NAD Cardiovascular: S1 S2 auscultated,  RRR Respiratory: Bilateral rhonchi Gastrointestinal: Soft, nontender, nondistended, + bowel sounds Ext: no pedal edema bilaterally Neuro: moving all 4 extremities found Psych: Normal affect, has dementia    Data Reviewed:  I have personally reviewed following labs    CBC Lab Results  Component Value Date   WBC 6.0 01/26/2023   RBC 4.73 01/26/2023   HGB 13.6 01/26/2023   HCT 42.6 01/26/2023   MCV 90.1 01/26/2023   MCH 28.8 01/26/2023   PLT 167 01/26/2023   MCHC 31.9 01/26/2023   RDW 13.2 01/26/2023   LYMPHSABS 1.2 01/26/2023   MONOABS 0.3 01/26/2023   EOSABS 0.0 01/26/2023   BASOSABS 0.0 [id number]     Last metabolic panel Lab Results  Component Value Date   NA 136 01/26/2023   K 4.0 01/26/2023   CL 102 01/26/2023   CO2 25 01/26/2023   BUN 18 01/26/2023   CREATININE 1.14 (H) 01/26/2023   GLUCOSE 140 (H) 01/26/2023   GFRNONAA 46 (L) 01/26/2023   GFRAA 38 (L) 01/03/2020   CALCIUM 8.6 (L) 01/26/2023   PHOS 4.4 01/26/2023   PROT 7.1 01/26/2023   ALBUMIN 3.8 01/26/2023   BILITOT 0.7 01/26/2023   ALKPHOS 87 01/26/2023   AST 21 01/26/2023   ALT 13 01/26/2023   ANIONGAP 9 01/26/2023    CBG (last 3)  No results for input(s): "GLUCAP" in the last 72 hours.    Coagulation Profile: No results for input(s): "INR", "PROTIME" in the last 168 hours.   Radiology Studies: I have personally reviewed the imaging studies  DG Chest 2 View  Result Date:  01/25/2023 CLINICAL DATA:  Weakness, hypoxia. EXAM: CHEST - 2 VIEW COMPARISON:  Radiograph 08/06/2022, CT 08/09/2021 FINDINGS: The heart is normal  in size. There is chronic aortic tortuosity. No focal airspace disease, pleural effusion, pulmonary edema or pneumothorax. Lateral view is limited due to overlying artifacts. Remote right proximal humerus fracture. IMPRESSION: No acute abnormality. Electronically Signed   By: Keith Rake M.D.   On: 01/25/2023 23:43   CT Head Wo Contrast  Result Date: 01/25/2023 CLINICAL DATA:  Altered mental status, weakness EXAM: CT HEAD WITHOUT CONTRAST TECHNIQUE: Contiguous axial images were obtained from the base of the skull through the vertex without intravenous contrast. RADIATION DOSE REDUCTION: This exam was performed according to the departmental dose-optimization program which includes automated exposure control, adjustment of the mA and/or kV according to patient size and/or use of iterative reconstruction technique. COMPARISON:  08/06/2022 FINDINGS: Brain: No evidence of acute infarction, hemorrhage, hydrocephalus, extra-axial collection or mass lesion/mass effect. Global cortical and central atrophy. Secondary ventricular prominence. Subcortical white matter and periventricular small vessel ischemic changes. 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 Vascular: Intracranial atherosclerosis. Skull: Normal. Negative for fracture or focal lesion. Sinuses/Orbits: Partial opacification of the bilateral ethmoid and right maxillary sinuses. Mastoid air cells are clear. Other: None. IMPRESSION: No acute intracranial abnormality. Atrophy with small vessel ischemic changes. 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 Electronically Signed   By: Julian Hy M.D.   On: 01/25/2023 23:31       Afton Lavalle M.D. [facility] 01/26/2023, 10:40 AM  Available via Epic secure chat 7am-7pm After 7 pm, please refer to night coverage provider listed on amion.

## 2023-01-26 NOTE — Evaluation (Addendum)
Physical Therapy Evaluation Patient Details Name: [name] MRN: [id number] DOB: 07/01/1932 Today's Date: 01/26/2023  History of Present Illness  Pt is an 89 y.o. female admitted to [facility] on 01/25/2023 with severe COVID-19 infection after presenting from SNF to [facility] ED complaining of generalized weakness. medical history significant for dementia, type 2 diabetes mellitus, essential hypertension, paroxysmal atrial fibrillation chronically anticoagulated on Eliquis, and chronic diastolic heart failure.   Clinical Impression  Pt is an 89 y.o. female with above HPI resulting in the deficits listed below (see PT Problem List). Pt resides at [facility] per chart review and ambulates independently at baseline. Pt performed bed mobility and sit to stand transfers with MOD A and able to take side steps to R each stand (x2). Recommend short term rehab stay at prior to d/c back to ALF for progression toward PLOF. Pt will benefit from continued skilled PT to maximize functional mobility to increase independence.         Recommendations for follow up therapy are one component of a multi-disciplinary discharge planning process, led by the attending physician.  Recommendations may be updated based on patient status, additional functional criteria and insurance authorization.  Follow Up Recommendations Skilled nursing-short term rehab (<3 hours/day) Can patient physically be transported by private vehicle: No    Assistance Recommended at Discharge Frequent or constant Supervision/Assistance  Patient can return home with the following  A lot of help with walking and/or transfers;A lot of help with bathing/dressing/bathroom;Help with stairs or ramp for entrance;Assist for transportation;Direct supervision/assist for financial management;Direct supervision/assist for medications management;Assistance with cooking/housework    Equipment Recommendations  (defer to next level of care)   Recommendations for Other Services       Functional Status Assessment Patient has had a recent decline in their functional status and demonstrates the ability to make significant improvements in function in a reasonable and predictable amount of time.     Precautions / Restrictions Precautions Precautions: Fall Restrictions Weight Bearing Restrictions: No      Mobility  Bed Mobility Overal bed mobility: Needs Assistance Bed Mobility: Supine to Sit, Sit to Supine     Supine to sit: Mod assist, HOB elevated Sit to supine: Mod assist, HOB elevated   General bed mobility comments: assist for LEs onto/off EOB and to scoot to EOB using chuck pad. R lateral leaning when fatigued.    Transfers Overall transfer level: Needs assistance Equipment used: None Transfers: Sit to/from Stand Sit to Stand: Mod assist           General transfer comment: STS x2, pt able to take side steps to R each stand with MOD A and using bedrail and HHA for stability. Pt deferring additional mobility and requesting to go back to bed. Once back in supine, pt quickly back to sleep.    Ambulation/Gait                  Stairs            Wheelchair Mobility    Modified Rankin (Stroke Patients Only)       Balance Overall balance assessment: Needs assistance Sitting-balance support: Feet supported, Single extremity supported Sitting balance-Leahy Scale: Fair     Standing balance support: Bilateral upper extremity supported, During functional activity Standing balance-Leahy Scale: Poor Standing balance comment: reliant on external support  Pertinent Vitals/Pain Pain Assessment Pain Assessment: No/denies pain    Home Living Family/patient expects to be discharged to:: Assisted living                   Additional Comments: Per chart review, pt resides at [facility]. She was receiving home health PT, recently graduated from  home health at the end of February. Pt typically able to walk independently at baseline.    Prior Function Prior Level of Function : Patient poor historian/Family not available             Mobility Comments: reports not using an AD at baseline ADLs Comments: pt reports she typically able to bathe and dress herself without assist, no family/caregiver present to confirm.     Hand Dominance        Extremity/Trunk Assessment             Cervical / Trunk Assessment Cervical / Trunk Assessment: Normal  Communication      Cognition Arousal/Alertness: Lethargic Behavior During Therapy: Flat affect Overall Cognitive Status: History of cognitive impairments - at baseline                                 General Comments: lethargic requiring repeated auditory and tactile stimulus to maintain arousal, improved after inital transfer. Responding with 1 word responses throughout session        General Comments      Exercises     Assessment/Plan    PT Assessment Patient needs continued PT services  PT Problem List Decreased strength;Decreased activity tolerance;Decreased balance;Decreased mobility;Decreased cognition       PT Treatment Interventions DME instruction;Gait training;Functional mobility training;Therapeutic activities;Therapeutic exercise;Balance training;Patient/family education    PT Goals (Current goals can be found in the Care Plan section)  Acute Rehab PT Goals Patient Stated Goal: none stated at this time PT Goal Formulation: Patient unable to participate in goal setting Time For Goal Achievement: 02/09/23 Potential to Achieve Goals: Good    Frequency Min 2X/week     Co-evaluation               AM-PAC PT "6 Clicks" Mobility  Outcome Measure Help needed turning from your back to your side while in a flat bed without using bedrails?: A Little Help needed moving from lying on your back to sitting on the side of a flat bed without  using bedrails?: A Lot Help needed moving to and from a bed to a chair (including a wheelchair)?: A Lot Help needed standing up from a chair using your arms (e.g., wheelchair or bedside chair)?: A Lot Help needed to walk in hospital room?: A Lot Help needed climbing 3-5 steps with a railing? : Total 6 Click Score: 12    End of Session Equipment Utilized During Treatment: Gait belt Activity Tolerance: Patient tolerated treatment well Patient left: in bed;with call bell/phone within reach;with bed alarm set Nurse Communication: Mobility status PT Visit Diagnosis: Unsteadiness on feet (R26.81);Muscle weakness (generalized) (M62.81)    Time: [id number] PT Time Calculation (min) (ACUTE ONLY): 29 min   Charges:   PT Evaluation $PT Eval Low Complexity: 1 Low PT Treatments $Therapeutic Activity: 8-22 mins        Festus Barren PT, DPT  Acute Rehabilitation Services  Office [phone]  01/26/2023, 2:14 PM

## 2023-01-26 NOTE — H&P (Signed)
History and Physical      [name] [name] DOB: 1932-09-26 DOA: 01/25/2023  PCP: Mayra Neer, MD  Patient coming from: home   I have personally briefly reviewed patient's old medical records in [place]  Chief Complaint: Generalized weakness  HPI: [name] is a 89 y.o. female with medical history significant for dementia, type 2 diabetes mellitus, essential hypertension, paroxysmal atrial fibrillation chronically anticoagulated on Eliquis, chronic diastolic heart failure, who is admitted to [facility] on 01/25/2023 with severe COVID-19 infection after presenting from SNF to [facility] ED complaining of generalized weakness.   In the setting of the patient's dementia, the following history is provided by the patient is will by SNF staff, in addition to my discussions with the ED PMD chart review.  The patient presents to was in the emergency department after SNF staff noted the patient to exhibit evidence of generalized weakness over the last 2 to 3 days, associate with new onset nonproductive cough.  Not associate with any report of acute focal weakness.  The patient denies any associated shortness of breath, chest pain, palpitations, diaphoresis, nausea, vomiting, dizziness, presyncope, or syncope.  No known chronic underlying pulmonary pathology.  No known baseline supplemental oxygen requirements.  Medical history is notable for paroxysmal atrial fibrillation for which she is chronically anticoagulated on Eliquis as well as chronic diastolic heart failure, with most recent echocardiogram performed in September 2022 notable for LVEF 60 to 65%, no focal wall motion analysis, grade 1 diastolic dysfunction with normal right ventricular stoppage, trivial mitral origination.      ED Course:  Vital signs in the ED were notable for the following: Temperature max 100.1; heart rate 0000000; systolic blood pressures in the 130s to 160s; respiratory rate 15-22, oxygen  saturation initially noted to be 88% on room air, Sosan improving into the range of 95 to 96% on 2 L nasal cannula.  Labs were notable for the following: CMP notable for the following: Bicarbonate 23, creatinine 1.18, liver enzymes within normal limits.  High sensitive troponin I initially noted to be 9, 3 value trending up to 10.  Typical CPK 198.  Lactic acid initially 1.1, with repeat value noted to be 1.3.  CBC notable for white cell count 7100.  Blood cultures x 2 were collected.  COVID-19 PCR is positive, will RSV/influenza PCR were negative.  EKG in ED demonstrated the following: While not yet formally released, EKG reportedly demonstrated no evidence of acute ischemic changes, including no report of any evidence of ST elevation.  Imaging and additional notable ED work-up: 2 view chest x-ray, performed radiology read, showed no evidence of acute cardiopulmonary process, including no evidence of infiltrate, edema, effusion, or pneumothorax.  While in the ED, the following were administered: Decadron 10 mg IV x 1, normal symptoms will intervals.  Subsequently, the patient was admitted for further evaluation and management of presenting severe [id number] infection, complicated by acute hypoxic respiratory distress in the setting of presenting generalized weakness.    Review of Systems: As per HPI otherwise 10 point review of systems negative.   Past Medical History:  Diagnosis Date   Allergies    Alzheimer disease ([place])    STABLE   Anemia, iron deficiency 12/12/2011   Arthritis    RF   CKD (chronic kidney disease)    STAGE 3   Diabetes mellitus    no mes, diet only    Diarrhea    RESOLVED   Hypercholesterolemia  Hyperlipemia    Hypertension    Major depression    OA (osteoarthritis)    HAND/LEFT KNEE PAIN MILD   Osteoporosis    Stroke [facility])    Syncope     Past Surgical History:  Procedure Laterality Date   INTRAMEDULLARY (IM) NAIL INTERTROCHANTERIC Right 09/27/2021    Procedure: INTRAMEDULLARY (IM) NAIL INTERTROCHANTRIC;  Surgeon: Shona Needles, MD;  Location: [place];  Service: Orthopedics;  Laterality: Right;   left knee arthroscopy     ORIF HUMERUS FRACTURE  12/09/2011   Procedure: OPEN REDUCTION INTERNAL FIXATION (ORIF) DISTAL HUMERUS FRACTURE;  Surgeon: Linna Hoff, MD;  Location: [facility];  Service: Orthopedics;  Laterality: Right;   OTHER SURGICAL HISTORY     left small toe bone spur removed    TONSILLECTOMY      Social History:  reports that she quit smoking about 13 years ago. Her smoking use included cigarettes. She has never used smokeless tobacco. She reports that she does not drink alcohol and does not use drugs.   Allergies  Allergen Reactions   Sulfa Antibiotics Hives, Diarrhea, Nausea And Vomiting and Swelling    Facial swelling ALSO   Latex Rash    NOT ON MAR    Family History  Problem Relation Age of Onset   Heart Problems Mother    Heart attack Maternal Grandmother        died from a "heart attack"   Stroke Neg Hx    Arrhythmia Neg Hx     Family history reviewed and not pertinent    Prior to Admission medications   Medication Sig Start Date End Date Taking? Authorizing Provider  acetaminophen (TYLENOL) 500 MG tablet Take 2 tablets (1,000 mg total) by mouth every 8 (eight) hours as needed. 08/08/22   Mercy Riding, MD  apixaban (ELIQUIS) 2.5 MG TABS tablet Take 1 tablet (2.5 mg total) by mouth 2 (two) times daily. 08/16/21   Little Ishikawa, MD  Calcium Carbonate Antacid (TUMS CHEWY BITES PO) Take 200 mg by mouth in the morning and at bedtime.    [provider]  cyanocobalamin 1000 MCG tablet Take 1,000 mcg by mouth daily.    [provider]  Ergocalciferol (VITAMIN D2 PO) Take 1,250 mcg by mouth once a week. monday    [provider]  feeding supplement (ENSURE ENLIVE / ENSURE PLUS) LIQD Take 237 mLs by mouth 2 (two) times daily between meals. 09/30/21   Terrilee Croak, MD  ferrous gluconate  (FERGON) 324 MG tablet Take 324 mg by mouth daily with breakfast.    [provider]  Lidocaine HCl (ASPERCREME LIDOCAINE) 4 % CREA Apply 1 Application topically every 12 (twelve) hours.    [provider]  Melatonin 3 MG TABS Take 3 mg by mouth at bedtime.    [provider]  memantine (NAMENDA) 10 MG tablet Take 10 mg by mouth 2 (two) times daily.    [provider]  Menthol, Topical Analgesic, (BIOFREEZE PROFESSIONAL) 5 % GEL Apply 1 application topically in the morning, at noon, and at bedtime. Apply on right knee    [provider]  metoprolol tartrate (LOPRESSOR) 25 MG tablet Take 0.5 tablets (12.5 mg total) by mouth 2 (two) times daily. 08/16/21   Little Ishikawa, MD  polyethylene glycol (MIRALAX / GLYCOLAX) 17 g packet Take 17 g by mouth daily as needed for mild constipation. 09/30/21   Terrilee Croak, MD  senna-docusate (SENOKOT-S) 8.6-50 MG tablet Take 1  tablet by mouth 2 (two) times daily. Patient taking differently: Take 2 tablets by mouth at bedtime. 09/30/21   Terrilee Croak, MD  sertraline (ZOLOFT) 100 MG tablet Take 100 mg by mouth at bedtime.    [provider]  simvastatin (ZOCOR) 20 MG tablet Take 1 tablet (20 mg total) by mouth daily at 6 PM. 08/19/21   Little Ishikawa, MD     Objective    Physical Exam: Vitals:   01/26/23 0030 01/26/23 0130 01/26/23 0200 01/26/23 0217  BP: (!) 133/92 (!) 164/62 (!) 138/95   Pulse: 79 78 77 80  Resp: (!) 21 (!) 22 (!) 22 20  Temp:    98.3 F (36.8 C)  TempSrc:    Oral  SpO2: 100% 91%  96%  Weight:      Height:        General: appears to be stated age; alert, confused Skin: warm, dry, no rash Head:  AT/[place] Mouth:  Oral mucosa membranes appear moist, normal dentition Neck: supple; trachea midline Heart:  RRR; did not appreciate any M/R/G Lungs: CTAB, did not appreciate any wheezes, rales, or rhonchi Abdomen: + BS; soft, ND, NT Vascular: 2+ pedal pulses b/l; 2+ radial  pulses b/l Extremities: no peripheral edema, no muscle wasting Neuro: strength and sensation intact in upper and lower extremities b/l    Labs on Admission: I have personally reviewed following labs and imaging studies  CBC: Recent Labs  Lab 01/25/23 2344  WBC 7.1  NEUTROABS 4.3  HGB 14.0  HCT 42.8  MCV 89.2  PLT [id number]   Basic Metabolic Panel: Recent Labs  Lab 01/25/23 2344  NA 133*  K 3.7  CL 100  CO2 23  GLUCOSE 119*  BUN 17  CREATININE 1.18*  CALCIUM 8.7*   GFR: Estimated Creatinine Clearance: 29.8 mL/min (A) (by C-G formula based on SCr of 1.18 mg/dL (H)). Liver Function Tests: Recent Labs  Lab 01/25/23 2344  AST 22  ALT 13  ALKPHOS 90  BILITOT 0.8  PROT 7.1  ALBUMIN 4.0   No results for input(s): "LIPASE", "AMYLASE" in the last 168 hours. No results for input(s): "AMMONIA" in the last 168 hours. Coagulation Profile: No results for input(s): "INR", "PROTIME" in the last 168 hours. Cardiac Enzymes: Recent Labs  Lab 01/25/23 2344  CKTOTAL 198   BNP (last 3 results) No results for input(s): "PROBNP" in the last 8760 hours. HbA1C: No results for input(s): "HGBA1C" in the last 72 hours. CBG: No results for input(s): "GLUCAP" in the last 168 hours. Lipid Profile: No results for input(s): "CHOL", "HDL", "LDLCALC", "TRIG", "CHOLHDL", "LDLDIRECT" in the last 72 hours. Thyroid Function Tests: No results for input(s): "TSH", "T4TOTAL", "FREET4", "T3FREE", "THYROIDAB" in the last 72 hours. Anemia Panel: No results for input(s): "VITAMINB12", "FOLATE", "FERRITIN", "TIBC", "IRON", "RETICCTPCT" in the last 72 hours. Urine analysis:    Component Value Date/Time   COLORURINE YELLOW 08/06/2022 0516   APPEARANCEUR HAZY (A) 08/06/2022 0516   LABSPEC 1.024 08/06/2022 0516   PHURINE 5.0 08/06/2022 0516   GLUCOSEU NEGATIVE 08/06/2022 0516   HGBUR SMALL (A) 08/06/2022 0516   BILIRUBINUR NEGATIVE 08/06/2022 0516   KETONESUR NEGATIVE 08/06/2022 0516   PROTEINUR  NEGATIVE 08/06/2022 0516   UROBILINOGEN 0.2 05/13/2015 1849   NITRITE POSITIVE (A) 08/06/2022 0516   LEUKOCYTESUR LARGE (A) 08/06/2022 0516    Radiological Exams on Admission: DG Chest 2 View  Result Date: 01/25/2023 CLINICAL DATA:  Weakness, hypoxia. EXAM: CHEST - 2 VIEW COMPARISON:  Radiograph  08/06/2022, CT 08/09/2021 FINDINGS: The heart is normal in size. There is chronic aortic tortuosity. No focal airspace disease, pleural effusion, pulmonary edema or pneumothorax. Lateral view is limited due to overlying artifacts. Remote right proximal humerus fracture. IMPRESSION: No acute abnormality. Electronically Signed   By: Keith Rake M.D.   On: 01/25/2023 23:43   CT Head Wo Contrast  Result Date: 01/25/2023 CLINICAL DATA:  Altered mental status, weakness EXAM: CT HEAD WITHOUT CONTRAST TECHNIQUE: Contiguous axial images were obtained from the base of the skull through the vertex without intravenous contrast. RADIATION DOSE REDUCTION: This exam was performed according to the departmental dose-optimization program which includes automated exposure control, adjustment of the mA and/or kV according to patient size and/or use of iterative reconstruction technique. COMPARISON:  08/06/2022 FINDINGS: Brain: No evidence of acute infarction, hemorrhage, hydrocephalus, extra-axial collection or mass lesion/mass effect. Global cortical and central atrophy. Secondary ventricular prominence. Subcortical white matter and periventricular small vessel ischemic changes. 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 Vascular: Intracranial atherosclerosis. Skull: Normal. Negative for fracture or focal lesion. Sinuses/Orbits: Partial opacification of the bilateral ethmoid and right maxillary sinuses. Mastoid air cells are clear. Other: None. IMPRESSION: No acute intracranial abnormality. Atrophy with small vessel ischemic changes. 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 Electronically Signed   By: Julian Hy M.D.   On: 01/25/2023 23:31       Assessment/Plan    Principal Problem:   COVID-19 virus infection Active Problems:   Paroxysmal atrial fibrillation (HCC)   DM2 (diabetes mellitus, type 2) (HCC)   Essential hypertension   Acute hypoxic respiratory failure (HCC)   Generalized weakness   Chronic diastolic CHF (congestive heart failure) ([place])    #) Severe COVID-19 infection: dx on the basis of 2 to 3 days of new onset cough, presenting mildly elevated temperature, and new finding of positive COVID-19 PCR. Additionally, in context of no known baseline supplemental O2 requirements, the pt  is requiring 2 L nasal cannula in order to maintain O2 sats greater than or equal to 94%. In setting of this acute hypoxia, criteria are met for patient's COVID-19 infxn to be considered severe in nature. Consequently, there is a Grade 2c rec for systemic corticosteroids. CXR shows no evidence of acute cardiopulmonary process.  Of note, in the setting of the pt's age greater than 52 as well as multiple co-morbidities that include chronic diastolic heart failure, this pt meets criteria to be considered high risk for a more complicated clinical course of COVID-19 infxn, including increased probability for progression of the severity associated with this infxn. Therefore, in setting of symptomatic COVID-19 infxn requiring hospitalization for further evaluation and management thereof in this pt with the aforementioned high risk criteria who is felt to be early in the course of their infxn given onset of respiratory symptoms starting less than 5 days ago, indications are met for initiation of remdesivir per tx guidance recommendations from [facility]'s Covid Treatment Guidelines. Of note, ALT found to be less than 220. Therefore, there is no contraindication for initiation of remdesivir on the basis of transaminitis.   Will closely monitor ensuing degree of hypoxia as well as associated trend in supplemental O2 requirements. Will also trend  inflammatory markers, as further described below.  No known chronic underlying pulmonary pathology.  Of note, in the setting of a history of diabetes, will initiate daily linagliptin as DPP-4 inhibitors have been shown to reduce mortality in patients with DM2 and a COVID-19.   Plan: Airborne and contact precautions. Monitor continuous pulse  ox and monitor on telemetry. prn supplemental O2 to maintain O2 sats greater than or equal to 94%. Proning protocol initiated. PRN albuterol nebulizer.  PRN acetaminophen for fever. Start solumedrol and remdesivir, as above. Check and trend inflammatory markers in the form of add on CRP, with repeat CRP ordered for the morning. Check serum mag and phos levels. Check CMP/CBC in the AM. Check procalcitonin, which, if non-elevated in the context of the pro inflammatory state associated with COVID-19, would provide a high degree of negative predictive value that would further decrease the likelihood of any contribution from bacterial pneumonia. Check blood gas.  Start Tradjenta, as above.             #) Acute hypoxic respiratory distress: in the context of no known baseline supplemental oxygen requirements, presenting O2 sat in the high 80s, Sosan improving into the mid 90s on 2 L nasal cannula, thereby meeting criteria for acute hypoxic respiratory distress as opposed to acute hypoxic respiratory failure at this time. Appears to be on the basis of COVID-19 infection, as above. No known chronic underlying pulmonary conditions. ACS is felt to be less likely at this time in the absence of any recent chest pain and in the context of negative troponin and presenting EKG showing no evidence of acute ischemic process. No clinical or radiographic evidence of suggest acutely decompensated heart failure at this time. While there is increased risk for acute PE in the setting of COVID-19 infection, clinically, this appears to be less likely at this time particularly given that  the patient is chronically anticoagulated on Eliquis. Influenza/RSV PCR found to be negative when checked today.   Plan: further evaluation and management of presenting COVID-19 infection, as above, including monitoring of continuous pulse oximetry with prn supplemental O2 to maintain O2 sats greater than or equal to 94% in addition to initiation of Solu-Medrol and remdesivir, as above. monitor on telemetry. Trending of inflammatory markers, as above. Check CMP and CBC in the morning. Check serum Mg and Phos levels. Check blood gas.  As needed albuterol nebulizer.  Add on BNP.            #) Generalized weakness: 2 to 3-day duration of generalized weakness, in the absence of any evidence of acute focal neurologic deficits, including no evidence of acute focal weakness to suggest acute CVA.  Suspect contribution from physiologic stress stemming from presenting severe COVID-19 infection.  No e/o additional infectious process at this time, including chest x-ray which shows no evidence of infiltrate.  Will add on procalcitonin level to further assess for any contribution from underlying bacterial pneumonia.  Will also check urinalysis. Will further eval for any additional contributions from endocrine/metabolic sources, as detailed below.  Of note, presenting CPK level nonelevated.   Plan: work-up and management of presenting severe COVID-19 infection, as described above. PT consult ordered for the AM. Fall precautions. CMP/CBC in the AM. Check TSH, serum Mg level. Check procalcitonin level, urinalysis.               #) Type 2 Diabetes Mellitus: documented history of such.  Managed via lifestyle modifications, in the absence of any outpatient insulin or oral hypoglycemic agents.  Most recent hemoglobin A1c was noted to be 5.5% when checked in September 2022.  Presenting blood sugar noted to be 119.    Plan: accuchecks QAC and HS with low dose SSI.  Add on hemoglobin A1c level.  Will start  Tradjenta as competitive management presenting severe COVID-19  infection, as further detailed above.            #) Essential Hypertension: documented h/o such, with outpatient antihypertensive regimen including metoprolol tartrate.  SBP's in the ED today: 130s to 160s mmHg.   Plan: Close monitoring of subsequent BP via routine VS. continue on beta-blocker.              #) Paroxysmal atrial fibrillation: Documented history of such. In setting of CHA2DS2-VASc score of 8, there is an indication for chronic anticoagulation for thromboembolic prophylaxis. Consistent with this, patient is chronically anticoagulated on Eliquis. Home AV nodal blocking regimen: Metoprolol to tartrate.  Most recent echocardiogram was performed September 2022, with results that include trivial mitral regurgitation, with additional results as described above.    Plan: monitor strict I's & O's and daily weights. CMP/CBC in AM. Check serum mag level. Continue home AV nodal blocking regimen.  Continue home Eliquis.                #) Chronic diastolic heart failure: documented history of such, with most recent echocardiogram performed in September 2022, which is notable for LVEF 60 to [id number], grade 1 diastolic dysfunction, with additional results as conveyed above. No clinical or radiographic evidence to suggest acutely decompensated heart failure at this time. home diuretic regimen reportedly consists of the following: None.    Plan: monitor strict I's & O's and daily weights. Repeat CMP in AM. Check serum mag level.  Add on BNP.       DVT prophylaxis: SCD's plus continue home Eliquis Code Status: DNR (per CODE STATUS documentation from most recent prior hospitalization) Family Communication: none Disposition Plan: Per Rounding Team Consults called: none;  Admission status: Inpatient    I SPENT GREATER THAN 75  MINUTES IN CLINICAL CARE TIME/MEDICAL DECISION-MAKING IN COMPLETING THIS  ADMISSION.     [place] DO [facility] [place]   01/26/2023, 2:47 AM

## 2023-01-26 NOTE — TOC Progression Note (Signed)
Transition of Care [facility]) - Progression Note    Patient Details  Name: UMAIMA GEHMAN MRN: [id number] Date of Birth: 1932-02-01  Transition of Care [facility]) CM/SW [place], LCSW Phone Number: 01/26/2023, 3:00 PM  Clinical Narrative:    CSW spoke with pt's daughter to discuss current recommendation for SNF placement. Pt is COVID + and will require 10-day isolation period prior to being able to transfer to SNF. Discussed the possibility of pt progressing while working with PT in hospital during isolation period to no longer need SNF. Pt's daughter agreeable to this.    Expected Discharge Plan: Assisted Living Barriers to Discharge: No Barriers Identified  Expected Discharge Plan and Services In-house Referral: Clinical Social Work Discharge Planning Services: NA Post Acute Care Choice: Resumption of Svcs/PTA Provider Living arrangements for the past 2 months: Assisted Living Facility                 DME Arranged: N/A DME Agency: NA                   Social Determinants of Health (SDOH) Interventions SDOH Screenings   Tobacco Use: Medium Risk (01/26/2023)    Readmission Risk Interventions    01/26/2023    3:00 PM 01/26/2023   11:35 AM  Readmission Risk Prevention Plan  Transportation Screening Complete Complete  PCP or Specialist Appt within 5-7 Days Complete Complete  Home Care Screening  Complete  Medication Review (RN CM)  Complete

## 2023-01-27 DIAGNOSIS — E119 Type 2 diabetes mellitus without complications: Secondary | ICD-10-CM | POA: Diagnosis not present

## 2023-01-27 DIAGNOSIS — J9601 Acute respiratory failure with hypoxia: Secondary | ICD-10-CM | POA: Diagnosis not present

## 2023-01-27 DIAGNOSIS — U071 COVID-19: Secondary | ICD-10-CM | POA: Diagnosis not present

## 2023-01-27 LAB — BASIC METABOLIC PANEL
Anion gap: 9 (ref 5–15)
BUN: 35 mg/dL — ABNORMAL HIGH (ref 8–23)
CO2: 24 mmol/L (ref 22–32)
Calcium: 8.7 mg/dL — ABNORMAL LOW (ref 8.9–10.3)
Chloride: 105 mmol/L (ref 98–111)
Creatinine, Ser: 1 mg/dL (ref 0.44–1.00)
GFR, Estimated: 54 mL/min — ABNORMAL LOW (ref >60)
Glucose, Bld: 142 mg/dL — ABNORMAL HIGH (ref 70–99)
Potassium: 4 mmol/L (ref 3.5–5.1)
Sodium: 138 mmol/L (ref 135–145)

## 2023-01-27 LAB — PHOSPHORUS: Phosphorus: 3.8 mg/dL (ref 2.5–4.6)

## 2023-01-27 LAB — CBC
HCT: 41 % (ref 36.0–46.0)
Hemoglobin: 13.2 g/dL (ref 12.0–15.0)
MCH: 28.8 pg (ref 26.0–34.0)
MCHC: 32.2 g/dL (ref 30.0–36.0)
MCV: 89.5 fL (ref 80.0–100.0)
Platelets: 210 10*3/uL (ref 150–400)
RBC: 4.58 MIL/uL (ref 3.87–5.11)
RDW: 13.2 % (ref 11.5–15.5)
WBC: 9.3 10*3/uL (ref 4.0–10.5)
nRBC: 0 % (ref 0.0–0.2)

## 2023-01-27 LAB — GLUCOSE, CAPILLARY
Glucose-Capillary: 134 mg/dL — ABNORMAL HIGH (ref 70–99)
Glucose-Capillary: 121 mg/dL — ABNORMAL HIGH (ref 70–99)
Glucose-Capillary: 86 mg/dL (ref 70–99)
Glucose-Capillary: 134 mg/dL — ABNORMAL HIGH (ref 70–99)

## 2023-01-27 LAB — MAGNESIUM: Magnesium: 2.1 mg/dL (ref 1.7–2.4)

## 2023-01-27 LAB — HEMOGLOBIN A1C
Hgb A1c MFr Bld: 5.7 % — ABNORMAL HIGH (ref 4.8–5.6)
Mean Plasma Glucose: 117 mg/dL

## 2023-01-27 LAB — C-REACTIVE PROTEIN: CRP: 3.4 mg/dL — ABNORMAL HIGH (ref <1.0)

## 2023-01-27 NOTE — Progress Notes (Signed)
[facility]                                                                              [name], is a 89 y.o. female, DOB - Jan 15, 1932, [id number] Admit date - 01/25/2023    Outpatient Primary MD for the patient is Mayra Neer, MD  LOS - 1  days  Chief Complaint  Patient presents with   Weakness       Brief summary   Patient is a 89 year old female with dementia, DM type II, HTN, paroxysmal A-fib, on Eliquis, chronic diastolic CHF presented to ED with generalized weakness.  Patient currently resides at [facility] and staff noted that patient was having weakness worsening over the last 2-3 days associated with new onset productive cough and fevers.  No focal weakness.  No shortness of breath, chest pain, diaphoresis or any syncope. In ED, noted to have temp of 100.1 F, HR 70-89, BP 133/92, RR 21-24, noted to have hypoxia in ED, 88% on room air and was placed on 2 L O2 with improvement in sats to 95%. Creatinine 1.18, CK 198, COVID-19 PCR positive. RSV, flu negative.   Assessment & Plan    Principal Problem: Acute respiratory failure with hypoxia secondary to COVID-19 virus infection -Patient noted to have new hypoxia, not on O2 at baseline. -BNP 198, troponin 9, lactic acid 1.1, procalcitonin < 0.1, CRP pending.  Chest x-ray showed no acute infiltrates -started on IV remdesivir day #2 today and continue Solu-Medrol. -Wean O2 as tolerated.   Active Problems: Generalized weakness, debility -Likely due to #1, continue management for COVID -PT OT evaluation-> SNF    Paroxysmal atrial fibrillation (HCC) -HR stable, continue metoprolol -On chronic anticoagulation with Eliquis, will continue    DM2 (diabetes mellitus, type 2) (HCC) -Hemoglobin A1c 5.7 -Continue sensitive scale insulin, Tradjenta CBG (last 3)  Recent Labs    01/26/23 2208 01/27/23 0743 01/27/23 1138  GLUCAP 202* 134* 121*      Essential hypertension -Continue metoprolol, BP  stable    Chronic diastolic CHF (congestive heart failure) (HCC) -Currently compensated, not on diuretics. -2D echo 07/2021 with EF of 60 to 65%, G1 DD  History of CKD stage IIIa -Creatinine currently at baseline 0.9-1.2 -Stable  Dementia -Continue on Namenda, Zoloft   Estimated body mass index is 28.31 kg/m as calculated from the following:   Height as of this encounter: '5\' 2"'$  (1.575 m).   Weight as of this encounter: 70.2 kg.  Code Status: DNR DVT Prophylaxis:  apixaban (ELIQUIS) tablet 2.5 mg Start: 01/26/23 0800 SCDs Start: 01/26/23 0236 apixaban (ELIQUIS) tablet 2.5 mg   Level of Care: Level of care: Telemetry Family Communication: Updated patient Disposition Plan:      Remains inpatient appropriate:  Per TOC, will require 10-day isolation prior to being able to transfer back to SNF   Procedures:    Consultants:   None  Antimicrobials:   Anti-infectives (From admission, onward)    Start     Dose/Rate Route Frequency Ordered Stop   01/27/23 1000  remdesivir 100 mg in sodium chloride 0.9 % 100 mL IVPB  See Hyperspace for full Linked Orders Report.   100 mg 200 mL/hr over 30 Minutes Intravenous Daily 01/26/23 0238 01/29/23 0959   01/26/23 0330  remdesivir 200 mg in sodium chloride 0.9% 250 mL IVPB       See Hyperspace for full Linked Orders Report.   200 mg 580 mL/hr over 30 Minutes Intravenous Once 01/26/23 0238 01/26/23 0404          Medications  apixaban  2.5 mg Oral BID   insulin aspart  0-6 Units Subcutaneous TID WC   linagliptin  5 mg Oral Daily   memantine  10 mg Oral BID   methylPREDNISolone (SOLU-MEDROL) injection  0.5 mg/kg Intravenous Q12H   Followed by   Derrill Memo ON 01/29/2023] predniSONE  50 mg Oral Daily   metoprolol tartrate  12.5 mg Oral BID   sertraline  100 mg Oral QHS      Subjective:   [name] was seen and examined today.  Much more alert and oriented today, no acute complaints feeling a lot better this morning.  No  chest pain, coughing or shortness of breath.   Objective:   Vitals:   01/26/23 2126 01/26/23 2147 01/27/23 0647 01/27/23 0700  BP: (!) 106/48 (!) 120/59 130/68   Pulse: 69 70 (!) 55   Resp:  18 16   Temp:  (!) 97.5 F (36.4 C) 97.7 F (36.5 C)   TempSrc:  Oral Oral   SpO2:  96% 92%   Weight:    70.2 kg  Height:        Intake/Output Summary (Last 24 hours) at 01/27/2023 1323 Last data filed at 01/26/2023 2300 Gross per 24 hour  Intake 560 ml  Output --  Net 560 ml     Wt Readings from Last 3 Encounters:  01/27/23 70.2 kg  08/06/22 68 kg  08/03/22 68 kg    Physical Exam General: Alert and oriented x 3, NAD Cardiovascular: S1 S2 clear, RRR.  Respiratory: CTAB, no wheezing Gastrointestinal: Soft, nontender, nondistended, NBS Ext: no pedal edema bilaterally Neuro: no new deficits Psych: Normal affect    Data Reviewed:  I have personally reviewed following labs    CBC Lab Results  Component Value Date   WBC 9.3 01/27/2023   RBC 4.58 01/27/2023   HGB 13.2 01/27/2023   HCT 41.0 01/27/2023   MCV 89.5 01/27/2023   MCH 28.8 01/27/2023   PLT 210 01/27/2023   MCHC 32.2 01/27/2023   RDW 13.2 01/27/2023   LYMPHSABS 1.2 01/26/2023   MONOABS 0.3 01/26/2023   EOSABS 0.0 01/26/2023   BASOSABS 0.0 Q000111Q     Last metabolic panel Lab Results  Component Value Date   NA 138 01/27/2023   K 4.0 01/27/2023   CL 105 01/27/2023   CO2 24 01/27/2023   BUN 35 (H) 01/27/2023   CREATININE 1.00 01/27/2023   GLUCOSE 142 (H) 01/27/2023   GFRNONAA 54 (L) 01/27/2023   GFRAA 38 (L) 01/03/2020   CALCIUM 8.7 (L) 01/27/2023   PHOS 3.8 01/27/2023   PROT 7.1 01/26/2023   ALBUMIN 3.8 01/26/2023   BILITOT 0.7 01/26/2023   ALKPHOS 87 01/26/2023   AST 21 01/26/2023   ALT 13 01/26/2023   ANIONGAP 9 01/27/2023    CBG (last 3)  Recent Labs    01/26/23 2208 01/27/23 0743 01/27/23 1138  GLUCAP 202* 134* 121*      Coagulation Profile: No results for input(s): "INR",  "PROTIME" in the last 168 hours.   Radiology Studies:  I have personally reviewed the imaging studies  DG Chest 2 View  Result Date: 01/25/2023 CLINICAL DATA:  Weakness, hypoxia. EXAM: CHEST - 2 VIEW COMPARISON:  Radiograph 08/06/2022, CT 08/09/2021 FINDINGS: The heart is normal in size. There is chronic aortic tortuosity. No focal airspace disease, pleural effusion, pulmonary edema or pneumothorax. Lateral view is limited due to overlying artifacts. Remote right proximal humerus fracture. IMPRESSION: No acute abnormality. Electronically Signed   By: Keith Rake M.D.   On: 01/25/2023 23:43   CT Head Wo Contrast  Result Date: 01/25/2023 CLINICAL DATA:  Altered mental status, weakness EXAM: CT HEAD WITHOUT CONTRAST TECHNIQUE: Contiguous axial images were obtained from the base of the skull through the vertex without intravenous contrast. RADIATION DOSE REDUCTION: This exam was performed according to the departmental dose-optimization program which includes automated exposure control, adjustment of the mA and/or kV according to patient size and/or use of iterative reconstruction technique. COMPARISON:  08/06/2022 FINDINGS: Brain: No evidence of acute infarction, hemorrhage, hydrocephalus, extra-axial collection or mass lesion/mass effect. Global cortical and central atrophy. Secondary ventricular prominence. Subcortical white matter and periventricular small vessel ischemic changes. 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 Vascular: Intracranial atherosclerosis. Skull: Normal. Negative for fracture or focal lesion. Sinuses/Orbits: Partial opacification of the bilateral ethmoid and right maxillary sinuses. Mastoid air cells are clear. Other: None. IMPRESSION: No acute intracranial abnormality. Atrophy with small vessel ischemic changes. 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 Electronically Signed   By: Julian Hy M.D.   On: 01/25/2023 23:31       Breyer Tejera M.D. [facility] 01/27/2023, 1:23 PM  Available  via Epic secure chat 7am-7pm After 7 pm, please refer to night coverage provider listed on amion.

## 2023-01-28 DIAGNOSIS — I480 Paroxysmal atrial fibrillation: Secondary | ICD-10-CM | POA: Diagnosis not present

## 2023-01-28 DIAGNOSIS — E119 Type 2 diabetes mellitus without complications: Secondary | ICD-10-CM | POA: Diagnosis not present

## 2023-01-28 DIAGNOSIS — U071 COVID-19: Secondary | ICD-10-CM | POA: Diagnosis not present

## 2023-01-28 DIAGNOSIS — J9601 Acute respiratory failure with hypoxia: Secondary | ICD-10-CM | POA: Diagnosis not present

## 2023-01-28 LAB — GLUCOSE, CAPILLARY
Glucose-Capillary: 126 mg/dL — ABNORMAL HIGH (ref 70–99)
Glucose-Capillary: 130 mg/dL — ABNORMAL HIGH (ref 70–99)
Glucose-Capillary: 124 mg/dL — ABNORMAL HIGH (ref 70–99)
Glucose-Capillary: 158 mg/dL — ABNORMAL HIGH (ref 70–99)

## 2023-01-28 LAB — CBC
HCT: 40.2 % (ref 36.0–46.0)
Hemoglobin: 12.9 g/dL (ref 12.0–15.0)
MCH: 29 pg (ref 26.0–34.0)
MCHC: 32.1 g/dL (ref 30.0–36.0)
MCV: 90.3 fL (ref 80.0–100.0)
Platelets: 230 10*3/uL (ref 150–400)
RBC: 4.45 MIL/uL (ref 3.87–5.11)
RDW: 13.4 % (ref 11.5–15.5)
WBC: 9.9 10*3/uL (ref 4.0–10.5)
nRBC: 0 % (ref 0.0–0.2)

## 2023-01-28 LAB — BASIC METABOLIC PANEL
Anion gap: 8 (ref 5–15)
BUN: 36 mg/dL — ABNORMAL HIGH (ref 8–23)
CO2: 23 mmol/L (ref 22–32)
Calcium: 8.2 mg/dL — ABNORMAL LOW (ref 8.9–10.3)
Chloride: 102 mmol/L (ref 98–111)
Creatinine, Ser: 1.15 mg/dL — ABNORMAL HIGH (ref 0.44–1.00)
GFR, Estimated: 46 mL/min — ABNORMAL LOW (ref >60)
Glucose, Bld: 123 mg/dL — ABNORMAL HIGH (ref 70–99)
Potassium: 4.1 mmol/L (ref 3.5–5.1)
Sodium: 133 mmol/L — ABNORMAL LOW (ref 135–145)

## 2023-01-28 LAB — C-REACTIVE PROTEIN: CRP: 1.1 mg/dL — ABNORMAL HIGH (ref <1.0)

## 2023-01-28 NOTE — Progress Notes (Signed)
[facility]                                                                              [name], is a 89 y.o. female, DOB - [birth year], [id number] Admit date - 01/25/2023    Outpatient Primary MD for the patient is Mayra Neer, MD  LOS - 2  days  Chief Complaint  Patient presents with   Weakness       Brief summary   Patient is a 89 year old female with dementia, DM type II, HTN, paroxysmal A-fib, on Eliquis, chronic diastolic CHF presented to ED with generalized weakness.  Patient currently resides at [facility] and staff noted that patient was having weakness worsening over the last 2-3 days associated with new onset productive cough and fevers.  No focal weakness.  No shortness of breath, chest pain, diaphoresis or any syncope. In ED, noted to have temp of 100.1 F, HR 70-89, BP 133/92, RR 21-24, noted to have hypoxia in ED, 88% on room air and was placed on 2 L O2 with improvement in sats to 95%. Creatinine 1.18, CK 198, COVID-19 PCR positive. RSV, flu negative.   Assessment & Plan    Principal Problem: Acute respiratory failure with hypoxia secondary to COVID-19 virus infection -Patient noted to have new hypoxia, not on O2 at baseline. -BNP 198, troponin 9, lactic acid 1.1, procalcitonin < 0.1, CRP pending.  Chest x-ray showed no acute infiltrates -started on IV remdesivir day # 3 today and continue Solu-Medrol. -O2 weaned, currently sats 95% on room air, improving.   Active Problems: Generalized weakness, debility -Likely due to #1, continue management for COVID -PT OT evaluation-> SNF    Paroxysmal atrial fibrillation (HCC) -Heart rate controlled, continue metoprolol -On chronic anticoagulation with Eliquis, will continue    DM2 (diabetes mellitus, type 2) (HCC) -Hemoglobin A1c 5.7 -Continue sensitive scale insulin, Tradjenta CBG (last 3)  Recent Labs    01/27/23 2031 01/28/23 0728 01/28/23 1105  GLUCAP 134* 126* 130*       Essential hypertension -Continue metoprolol, BP stable    Chronic diastolic CHF (congestive heart failure) (HCC) -Currently compensated, not on diuretics. -2D echo 07/2021 with EF of 60 to 65%, G1 DD  History of CKD stage IIIa -Creatinine currently at baseline 0.9-1.2 -Creatinine still at baseline, follow  Dementia -Continue on Namenda, Zoloft   Estimated body mass index is 28.63 kg/m as calculated from the following:   Height as of this encounter: '5\' 2"'$  (1.575 m).   Weight as of this encounter: 71 kg.  Code Status: DNR DVT Prophylaxis:  apixaban (ELIQUIS) tablet 2.5 mg Start: 01/26/23 0800 SCDs Start: 01/26/23 0236 apixaban (ELIQUIS) tablet 2.5 mg   Level of Care: Level of care: Telemetry Family Communication: Updated patient Disposition Plan:      Remains inpatient appropriate:  Per TOC, will require 10-day isolation prior to being able to transfer back to SNF   Procedures:    Consultants:   None  Antimicrobials:   Anti-infectives (From admission, onward)    Start     Dose/Rate Route Frequency Ordered Stop   01/27/23 1000  remdesivir 100 mg in sodium  chloride 0.9 % 100 mL IVPB       See Hyperspace for full Linked Orders Report.   100 mg 200 mL/hr over 30 Minutes Intravenous Daily 01/26/23 0238 01/28/23 1107   01/26/23 0330  remdesivir 200 mg in sodium chloride 0.9% 250 mL IVPB       See Hyperspace for full Linked Orders Report.   200 mg 580 mL/hr over 30 Minutes Intravenous Once 01/26/23 0238 01/26/23 0404          Medications  apixaban  2.5 mg Oral BID   insulin aspart  0-6 Units Subcutaneous TID WC   linagliptin  5 mg Oral Daily   memantine  10 mg Oral BID   methylPREDNISolone (SOLU-MEDROL) injection  0.5 mg/kg Intravenous Q12H   Followed by   Derrill Memo ON 01/29/2023] predniSONE  50 mg Oral Daily   metoprolol tartrate  12.5 mg Oral BID   sertraline  100 mg Oral QHS      Subjective:   [name] was seen and examined today.  Sitting up in the  bed, watching TV and no acute issues.  No fevers or chills, cough, chest pain.    Objective:   Vitals:   01/27/23 1923 01/28/23 0451 01/28/23 0500 01/28/23 1305  BP: (!) 139/56 120/60  (!) 115/53  Pulse: 68 62  64  Resp: '16 20 18 18  '$ Temp: 98.2 F (36.8 C) 98.1 F (36.7 C)  98.2 F (36.8 C)  TempSrc: Oral   Oral  SpO2: 94% 93% 95% 95%  Weight:   71 kg   Height:        Intake/Output Summary (Last 24 hours) at 01/28/2023 1533 Last data filed at 01/28/2023 1256 Gross per 24 hour  Intake 1096 ml  Output --  Net 1096 ml     Wt Readings from Last 3 Encounters:  01/28/23 71 kg  08/06/22 68 kg  08/03/22 68 kg   Physical Exam General: Alert and oriented x 3, NAD Cardiovascular: S1 S2 clear, RRR.  Respiratory: CTAB, no wheezing Gastrointestinal: Soft, nontender, nondistended, NBS Ext: no pedal edema bilaterally Neuro: no new deficits Psych: Normal affect   Data Reviewed:  I have personally reviewed following labs    CBC Lab Results  Component Value Date   WBC 9.9 01/28/2023   RBC 4.45 01/28/2023   HGB 12.9 01/28/2023   HCT 40.2 01/28/2023   MCV 90.3 01/28/2023   MCH 29.0 01/28/2023   PLT 230 01/28/2023   MCHC 32.1 01/28/2023   RDW 13.4 01/28/2023   LYMPHSABS 1.2 01/26/2023   MONOABS 0.3 01/26/2023   EOSABS 0.0 01/26/2023   BASOSABS 0.0 Q000111Q     Last metabolic panel Lab Results  Component Value Date   NA 133 (L) 01/28/2023   K 4.1 01/28/2023   CL 102 01/28/2023   CO2 23 01/28/2023   BUN 36 (H) 01/28/2023   CREATININE 1.15 (H) 01/28/2023   GLUCOSE 123 (H) 01/28/2023   GFRNONAA 46 (L) 01/28/2023   GFRAA 38 (L) 01/03/2020   CALCIUM 8.2 (L) 01/28/2023   PHOS 3.8 01/27/2023   PROT 7.1 01/26/2023   ALBUMIN 3.8 01/26/2023   BILITOT 0.7 01/26/2023   ALKPHOS 87 01/26/2023   AST 21 01/26/2023   ALT 13 01/26/2023   ANIONGAP 8 01/28/2023    CBG (last 3)  Recent Labs    01/27/23 2031 01/28/23 0728 01/28/23 1105  GLUCAP 134* 126* 130*       Coagulation Profile: No results for input(s): "INR", "PROTIME"  in the last 168 hours.   Radiology Studies: I have personally reviewed the imaging studies  No results found.     Estill Cotta M.D. [facility] 01/28/2023, 3:33 PM  Available via Epic secure chat 7am-7pm After 7 pm, please refer to night coverage provider listed on amion.

## 2023-01-29 DIAGNOSIS — U071 COVID-19: Secondary | ICD-10-CM | POA: Diagnosis not present

## 2023-01-29 DIAGNOSIS — I5032 Chronic diastolic (congestive) heart failure: Secondary | ICD-10-CM | POA: Diagnosis not present

## 2023-01-29 DIAGNOSIS — J9601 Acute respiratory failure with hypoxia: Secondary | ICD-10-CM | POA: Diagnosis not present

## 2023-01-29 DIAGNOSIS — E119 Type 2 diabetes mellitus without complications: Secondary | ICD-10-CM | POA: Diagnosis not present

## 2023-01-29 LAB — GLUCOSE, CAPILLARY
Glucose-Capillary: 133 mg/dL — ABNORMAL HIGH (ref 70–99)
Glucose-Capillary: 147 mg/dL — ABNORMAL HIGH (ref 70–99)
Glucose-Capillary: 152 mg/dL — ABNORMAL HIGH (ref 70–99)
Glucose-Capillary: 148 mg/dL — ABNORMAL HIGH (ref 70–99)

## 2023-01-29 LAB — BASIC METABOLIC PANEL
Anion gap: 10 (ref 5–15)
BUN: 32 mg/dL — ABNORMAL HIGH (ref 8–23)
CO2: 25 mmol/L (ref 22–32)
Calcium: 8.9 mg/dL (ref 8.9–10.3)
Chloride: 103 mmol/L (ref 98–111)
Creatinine, Ser: 1.07 mg/dL — ABNORMAL HIGH (ref 0.44–1.00)
GFR, Estimated: 50 mL/min — ABNORMAL LOW (ref >60)
Glucose, Bld: 157 mg/dL — ABNORMAL HIGH (ref 70–99)
Potassium: 4.9 mmol/L (ref 3.5–5.1)
Sodium: 138 mmol/L (ref 135–145)

## 2023-01-29 MED ORDER — BENZONATATE 100 MG PO CAPS
200.0000 mg | ORAL_CAPSULE | Freq: Three times a day (TID) | ORAL | Status: DC
  Administered 2023-01-29 – 2023-02-06 (×24): 200 mg via ORAL
  Filled 2023-01-29 (×23): qty 2

## 2023-01-29 MED ORDER — LOSARTAN POTASSIUM 25 MG PO TABS
12.5000 mg | ORAL_TABLET | Freq: Every day | ORAL | Status: DC
  Administered 2023-01-29 – 2023-01-30 (×2): 12.5 mg via ORAL
  Filled 2023-01-29 (×2): qty 0.5

## 2023-01-29 NOTE — Progress Notes (Signed)
Pt found sitting on the floor at 0700. Pt helped up and back into bed. No apparent injuries noted. Pt not complaining of any pain or concerns from fall. VS WNL No s/s of distress noted. MD notified and Post fall documentation done. Safety zone portal entered.

## 2023-01-29 NOTE — Progress Notes (Signed)
[facility]                                                                              [name], is a 89 y.o. female, DOB - 1931-11-30, [id number] Admit date - 01/25/2023    Outpatient Primary MD for the patient is Mayra Neer, MD  LOS - 3  days  Chief Complaint  Patient presents with   Weakness       Brief summary   Patient is a 89 year old female with dementia, DM type II, HTN, paroxysmal A-fib, on Eliquis, chronic diastolic CHF presented to ED with generalized weakness.  Patient currently resides at [facility] and staff noted that patient was having weakness worsening over the last 2-3 days associated with new onset productive cough and fevers.  No focal weakness.  No shortness of breath, chest pain, diaphoresis or any syncope. In ED, noted to have temp of 100.1 F, HR 70-89, BP 133/92, RR 21-24, noted to have hypoxia in ED, 88% on room air and was placed on 2 L O2 with improvement in sats to 95%. Creatinine 1.18, CK 198, COVID-19 PCR positive. RSV, flu negative.   Assessment & Plan    Principal Problem: Acute respiratory failure with hypoxia secondary to COVID-19 virus infection -Patient noted to have new hypoxia, not on O2 at baseline. -BNP 198, troponin 9, lactic acid 1.1, procalcitonin < 0.1, CRP pending.  Chest x-ray showed no acute infiltrates -Completed remdesivir on 01/28/2023, continue steroids  -O2 weaned off.  Coughing today, placed on Tessalon Perles - per SNF policy, they requested patient complete 10-day isolation   Active Problems: Generalized weakness, debility -Likely due to #1, continue management for COVID -PT OT evaluation-> SNF    Paroxysmal atrial fibrillation (HCC) -HR controlled, continue metoprolol -Continue eliquis    DM2 (diabetes mellitus, type 2) (HCC) -Hemoglobin A1c 5.7 CBG (last 3)  Recent Labs    01/28/23 2045 01/29/23 0745 01/29/23 1132  GLUCAP 158* 133* 147*  -Continue SSI, Tradjenta    Essential  hypertension -BP elevated, continue metoprolol -Resume losartan    Chronic diastolic CHF (congestive heart failure) (HCC) -Currently compensated, not on diuretics. -2D echo 07/2021 with EF of 60 to 65%, G1 DD  History of CKD stage IIIa -Creatinine currently at baseline 0.9-1.2 -Creatinine still at baseline, follow  Dementia -Continue on Namenda, Zoloft   Estimated body mass index is 28.63 kg/m as calculated from the following:   Height as of this encounter: '5\' 2"'$  (1.575 m).   Weight as of this encounter: 71 kg.  Code Status: DNR DVT Prophylaxis:  apixaban (ELIQUIS) tablet 2.5 mg Start: 01/26/23 0800 SCDs Start: 01/26/23 0236 apixaban (ELIQUIS) tablet 2.5 mg   Level of Care: Level of care: Med-Surg Family Communication: Updated patient Disposition Plan:      Remains inpatient appropriate:  Per TOC, will require 10-day isolation prior to being able to transfer back to SNF   Procedures:    Consultants:   None  Antimicrobials:   Anti-infectives (From admission, onward)    Start     Dose/Rate Route Frequency Ordered Stop   01/27/23 1000  remdesivir 100 mg in sodium  chloride 0.9 % 100 mL IVPB       See Hyperspace for full Linked Orders Report.   100 mg 200 mL/hr over 30 Minutes Intravenous Daily 01/26/23 0238 01/28/23 1107   01/26/23 0330  remdesivir 200 mg in sodium chloride 0.9% 250 mL IVPB       See Hyperspace for full Linked Orders Report.   200 mg 580 mL/hr over 30 Minutes Intravenous Once 01/26/23 0238 01/26/23 0404          Medications  apixaban  2.5 mg Oral BID   insulin aspart  0-6 Units Subcutaneous TID WC   linagliptin  5 mg Oral Daily   memantine  10 mg Oral BID   metoprolol tartrate  12.5 mg Oral BID   predniSONE  50 mg Oral Daily   sertraline  100 mg Oral QHS      Subjective:   [name] was seen and examined today.  No acute complaints, wants to be discharged, unfortunately awaiting isolation completion for SNF.  Upset that she  cannot be discharged yet.  Objective:   Vitals:   01/28/23 1936 01/29/23 0519 01/29/23 0738 01/29/23 1136  BP: (!) 165/69 (!) 171/90 (!) 167/73 (!) 134/46  Pulse: (!) 57 (!) 54 (!) 55 60  Resp: '17 15 16 20  '$ Temp: 98.6 F (37 C) 98.2 F (36.8 C) 97.8 F (36.6 C) (!) 97.5 F (36.4 C)  TempSrc:   Oral Oral  SpO2: 95% 95% 95% 93%  Weight:      Height:        Intake/Output Summary (Last 24 hours) at 01/29/2023 1141 Last data filed at 01/29/2023 0827 Gross per 24 hour  Intake 476 ml  Output 800 ml  Net -324 ml     Wt Readings from Last 3 Encounters:  01/28/23 71 kg  08/06/22 68 kg  08/03/22 68 kg   Physical Exam General: Alert and oriented, NAD Cardiovascular: S1 S2 clear, RRR.  Respiratory: CTAB, no wheezing Gastrointestinal: Soft, nontender, nondistended, NBS Ext: no pedal edema bilaterally Neuro: no new deficits Psych: Normal affect   Data Reviewed:  I have personally reviewed following labs    CBC Lab Results  Component Value Date   WBC 9.9 01/28/2023   RBC 4.45 01/28/2023   HGB 12.9 01/28/2023   HCT 40.2 01/28/2023   MCV 90.3 01/28/2023   MCH 29.0 01/28/2023   PLT 230 01/28/2023   MCHC 32.1 01/28/2023   RDW 13.4 01/28/2023   LYMPHSABS 1.2 01/26/2023   MONOABS 0.3 01/26/2023   EOSABS 0.0 01/26/2023   BASOSABS 0.0 Q000111Q     Last metabolic panel Lab Results  Component Value Date   NA 138 01/29/2023   K 4.9 01/29/2023   CL 103 01/29/2023   CO2 25 01/29/2023   BUN 32 (H) 01/29/2023   CREATININE 1.07 (H) 01/29/2023   GLUCOSE 157 (H) 01/29/2023   GFRNONAA 50 (L) 01/29/2023   GFRAA 38 (L) 01/03/2020   CALCIUM 8.9 01/29/2023   PHOS 3.8 01/27/2023   PROT 7.1 01/26/2023   ALBUMIN 3.8 01/26/2023   BILITOT 0.7 01/26/2023   ALKPHOS 87 01/26/2023   AST 21 01/26/2023   ALT 13 01/26/2023   ANIONGAP 10 01/29/2023    CBG (last 3)  Recent Labs    01/28/23 2045 01/29/23 0745 01/29/23 1132  GLUCAP 158* 133* 147*      Coagulation  Profile: No results for input(s): "INR", "PROTIME" in the last 168 hours.   Radiology Studies: I have personally  reviewed the imaging studies  No results found.     Estill Cotta M.D. [facility] 01/29/2023, 11:41 AM  Available via Epic secure chat 7am-7pm After 7 pm, please refer to night coverage provider listed on amion.

## 2023-01-30 DIAGNOSIS — E119 Type 2 diabetes mellitus without complications: Secondary | ICD-10-CM | POA: Diagnosis not present

## 2023-01-30 DIAGNOSIS — J9601 Acute respiratory failure with hypoxia: Secondary | ICD-10-CM | POA: Diagnosis not present

## 2023-01-30 DIAGNOSIS — U071 COVID-19: Secondary | ICD-10-CM | POA: Diagnosis not present

## 2023-01-30 LAB — GLUCOSE, CAPILLARY
Glucose-Capillary: 89 mg/dL (ref 70–99)
Glucose-Capillary: 135 mg/dL — ABNORMAL HIGH (ref 70–99)
Glucose-Capillary: 133 mg/dL — ABNORMAL HIGH (ref 70–99)
Glucose-Capillary: 125 mg/dL — ABNORMAL HIGH (ref 70–99)

## 2023-01-30 MED ORDER — HYDRALAZINE HCL 20 MG/ML IJ SOLN: 10.0000 mg | Freq: Four times a day (QID) | INTRAMUSCULAR | Status: DC | PRN

## 2023-01-30 MED ORDER — LOSARTAN POTASSIUM 50 MG PO TABS
25.0000 mg | ORAL_TABLET | Freq: Every day | ORAL | Status: DC
  Administered 2023-01-31 – 2023-02-01 (×2): 25 mg via ORAL
  Filled 2023-01-30 (×2): qty 1

## 2023-01-30 NOTE — Progress Notes (Signed)
Physical Therapy Treatment Patient Details Name: [name] MRN: [id number] DOB: 1932/02/17 Today's Date: 01/30/2023   History of Present Illness Pt is an 89 y.o. female admitted to [facility] on 01/25/2023 with severe COVID-19 infection after presenting from SNF to [facility] ED complaining of generalized weakness. medical history significant for dementia, type 2 diabetes mellitus, essential hypertension, paroxysmal atrial fibrillation chronically anticoagulated on Eliquis, and chronic diastolic heart failure.    PT Comments     Pt admitted with above diagnosis. Pt currently with functional limitations due to the deficits listed below (see PT Problem List). Pt presented to therapy in recliner today, apparently more alert and communicative than last therapy intervention. Pt able to follow commands and actively engage with tx session. Pt required increased time for functional mobility tasks with cues for safety and technique t/o. Transfers with min guard and cues, gait tasks in room with RW min guard and cues for 24 feet. Pt left seated in recliner set up for lunch and all needs in place. Pt will benefit from skilled PT to increase their independence and safety with mobility to allow discharge to the venue listed below.     Recommendations for follow up therapy are one component of a multi-disciplinary discharge planning process, led by the attending physician.  Recommendations may be updated based on patient status, additional functional criteria and insurance authorization.  Follow Up Recommendations  Skilled nursing-short term rehab (<3 hours/day) Can patient physically be transported by private vehicle: Yes   Assistance Recommended at Discharge Frequent or constant Supervision/Assistance  Patient can return home with the following A lot of help with walking and/or transfers;A lot of help with bathing/dressing/bathroom;Help with stairs or ramp for entrance;Assist for transportation;Direct  supervision/assist for financial management;Direct supervision/assist for medications management;Assistance with cooking/housework;A little help with walking and/or transfers   Equipment Recommendations   (defer to next level of care)    Recommendations for Other Services       Precautions / Restrictions Precautions Precautions: Fall Restrictions Weight Bearing Restrictions: No     Mobility  Bed Mobility               General bed mobility comments: pt in recliner when PT arrived    Transfers Overall transfer level: Needs assistance Equipment used: None Transfers: Sit to/from Stand Sit to Stand: Min guard           General transfer comment: cues for proper hand placement    Ambulation/Gait Ambulation/Gait assistance: Min guard Gait Distance (Feet): 24 Feet Assistive device: Rolling walker (2 wheels) Gait Pattern/deviations: Decreased stride length, Shuffle, Trunk flexed Gait velocity: decreased     General Gait Details: pt requried cues for direction and safety with turns and obstacle navigation   Stairs             Wheelchair Mobility    Modified Rankin (Stroke Patients Only)       Balance Overall balance assessment: Needs assistance         Standing balance support: Bilateral upper extremity supported, During functional activity Standing balance-Leahy Scale: Poor                              Cognition Arousal/Alertness: Awake/alert Behavior During Therapy: Flat affect Overall Cognitive Status: History of cognitive impairments - at baseline  Exercises      General Comments        Pertinent Vitals/Pain Pain Assessment Pain Assessment: No/denies pain    Home Living Family/patient expects to be discharged to:: Assisted living                   Additional Comments: Per chart review, pt resides at [facility]. She was receiving home health PT,  recently graduated from home health at the end of February. Pt typically able to walk independently at baseline.    Prior Function            PT Goals (current goals can now be found in the care plan section) Acute Rehab PT Goals Patient Stated Goal: none stated at this time PT Goal Formulation: Patient unable to participate in goal setting Time For Goal Achievement: 02/09/23 Potential to Achieve Goals: Good    Frequency    Min 2X/week      PT Plan      Co-evaluation              AM-PAC PT "6 Clicks" Mobility   Outcome Measure  Help needed turning from your back to your side while in a flat bed without using bedrails?: A Little Help needed moving from lying on your back to sitting on the side of a flat bed without using bedrails?: A Lot Help needed moving to and from a bed to a chair (including a wheelchair)?: A Little Help needed standing up from a chair using your arms (e.g., wheelchair or bedside chair)?: A Little Help needed to walk in hospital room?: A Little Help needed climbing 3-5 steps with a railing? : Total 6 Click Score: 15    End of Session Equipment Utilized During Treatment: Gait belt Activity Tolerance: Patient tolerated treatment well Patient left: with chair alarm set;with call bell/phone within reach Nurse Communication: Mobility status PT Visit Diagnosis: Unsteadiness on feet (R26.81);Muscle weakness (generalized) (M62.81)     Time: [id number] PT Time Calculation (min) (ACUTE ONLY): 23 min  Charges:  $Gait Training: 8-22 mins $Therapeutic Activity: 8-22 mins                     Baird Lyons, PT    Adair Patter 01/30/2023, 12:21 PM

## 2023-01-30 NOTE — Progress Notes (Signed)
[facility]                                                                              [name], is a 89 y.o. female, DOB - [birth year], [id number] Admit date - 01/25/2023    Outpatient Primary MD for the patient is Mayra Neer, MD  LOS - 4  days  Chief Complaint  Patient presents with   Weakness       Brief summary   Patient is a 89 year old female with dementia, DM type II, HTN, paroxysmal A-fib, on Eliquis, chronic diastolic CHF presented to ED with generalized weakness.  Patient currently resides at [facility] and staff noted that patient was having weakness worsening over the last 2-3 days associated with new onset productive cough and fevers.  No focal weakness.  No shortness of breath, chest pain, diaphoresis or any syncope. In ED, noted to have temp of 100.1 F, HR 70-89, BP 133/92, RR 21-24, noted to have hypoxia in ED, 88% on room air and was placed on 2 L O2 with improvement in sats to 95%. Creatinine 1.18, CK 198, COVID-19 PCR positive. RSV, flu negative.   Assessment & Plan    Principal Problem: Acute respiratory failure with hypoxia secondary to COVID-19 virus infection -Patient noted to have new hypoxia, not on O2 at baseline. -BNP 198, troponin 9, lactic acid 1.1, procalcitonin < 0.1, CRP pending.  Chest x-ray showed no acute infiltrates -Completed remdesivir on 01/28/2023, continue steroids  -O2 weaned off - per SNF policy, they requested patient complete 10-day isolation   Active Problems: Generalized weakness, debility -Likely due to #1, continue management for COVID -PT OT evaluation-> SNF    Paroxysmal atrial fibrillation (HCC) -HR controlled, continue metoprolol -Continue eliquis    DM2 (diabetes mellitus, type 2) (HCC) -Hemoglobin A1c 5.7 -Continue sliding scale insulin, Tradjenta    Essential hypertension -BP still elevated, continue metoprolol, losartan increased to 25 mg daily -Add hydralazine IV as needed    Chronic  diastolic CHF (congestive heart failure) ([place]) -Currently compensated, not on diuretics. -2D echo 07/2021 with EF of 60 to 65%, G1 DD  History of CKD stage IIIa -Creatinine currently at baseline 0.9-1.2 -Creatinine still at baseline, follow  Dementia -Continue on Namenda, Zoloft   Estimated body mass index is 30.65 kg/m as calculated from the following:   Height as of this encounter: '5\' 2"'$  (1.575 m).   Weight as of this encounter: 76 kg.  Code Status: DNR DVT Prophylaxis:  apixaban (ELIQUIS) tablet 2.5 mg Start: 01/26/23 0800 SCDs Start: 01/26/23 0236 apixaban (ELIQUIS) tablet 2.5 mg   Level of Care: Level of care: Med-Surg Family Communication: Updated patient Disposition Plan:      Remains inpatient appropriate:  Per TOC, will require 10-day isolation prior to being able to transfer back to SNF   Procedures:    Consultants:   None  Antimicrobials:   Anti-infectives (From admission, onward)    Start     Dose/Rate Route Frequency Ordered Stop   01/27/23 1000  remdesivir 100 mg in sodium chloride 0.9 % 100 mL IVPB       See Hyperspace for full  Linked Orders Report.   100 mg 200 mL/hr over 30 Minutes Intravenous Daily 01/26/23 0238 01/28/23 1107   01/26/23 0330  remdesivir 200 mg in sodium chloride 0.9% 250 mL IVPB       See Hyperspace for full Linked Orders Report.   200 mg 580 mL/hr over 30 Minutes Intravenous Once 01/26/23 0238 01/26/23 0404          Medications  apixaban  2.5 mg Oral BID   benzonatate  200 mg Oral TID   insulin aspart  0-6 Units Subcutaneous TID WC   linagliptin  5 mg Oral Daily   losartan  12.5 mg Oral Daily   memantine  10 mg Oral BID   metoprolol tartrate  12.5 mg Oral BID   predniSONE  50 mg Oral Daily   sertraline  100 mg Oral QHS      Subjective:   [name] was seen and examined today.  No acute complaints eating breakfast and watching TV.  Has to complete isolation for 10 days for SNF.   Objective:   Vitals:    01/29/23 1951 01/30/23 0459 01/30/23 0500 01/30/23 0848  BP: (!) 134/59 (!) 166/67  (!) 179/58  Pulse: 63 (!) 45  (!) 48  Resp:  18  20  Temp: 97.8 F (36.6 C) 97.7 F (36.5 C)  98.2 F (36.8 C)  TempSrc: Oral Oral  Oral  SpO2: 94% 95%  95%  Weight:   76 kg   Height:        Intake/Output Summary (Last 24 hours) at 01/30/2023 1220 Last data filed at 01/30/2023 0800 Gross per 24 hour  Intake 820 ml  Output --  Net 820 ml     Wt Readings from Last 3 Encounters:  01/30/23 76 kg  08/06/22 68 kg  08/03/22 68 kg    Physical Exam General: Alert and oriented x 3, NAD Cardiovascular: S1 S2 clear, RRR.  Respiratory: CTAB, no wheezing Gastrointestinal: Soft, nontender, nondistended, NBS Ext: no pedal edema bilaterally Psych: Normal affect   Data Reviewed:  I have personally reviewed following labs    CBC Lab Results  Component Value Date   WBC 9.9 01/28/2023   RBC 4.45 01/28/2023   HGB 12.9 01/28/2023   HCT 40.2 01/28/2023   MCV 90.3 01/28/2023   MCH 29.0 01/28/2023   PLT 230 01/28/2023   MCHC 32.1 01/28/2023   RDW 13.4 01/28/2023   LYMPHSABS 1.2 01/26/2023   MONOABS 0.3 01/26/2023   EOSABS 0.0 01/26/2023   BASOSABS 0.0 [id number]     Last metabolic panel Lab Results  Component Value Date   NA 138 01/29/2023   K 4.9 01/29/2023   CL 103 01/29/2023   CO2 25 01/29/2023   BUN 32 (H) 01/29/2023   CREATININE 1.07 (H) 01/29/2023   GLUCOSE 157 (H) 01/29/2023   GFRNONAA 50 (L) 01/29/2023   GFRAA 38 (L) 01/03/2020   CALCIUM 8.9 01/29/2023   PHOS 3.8 01/27/2023   PROT 7.1 01/26/2023   ALBUMIN 3.8 01/26/2023   BILITOT 0.7 01/26/2023   ALKPHOS 87 01/26/2023   AST 21 01/26/2023   ALT 13 01/26/2023   ANIONGAP 10 01/29/2023    CBG (last 3)  Recent Labs    01/29/23 1957 01/30/23 0830 01/30/23 1156  GLUCAP 148* 89 135*      Coagulation Profile: No results for input(s): "INR", "PROTIME" in the last 168 hours.   Radiology Studies: I have personally  reviewed the imaging studies  No results found.  Estill Cotta M.D. [facility] 01/30/2023, 12:20 PM  Available via Epic secure chat 7am-7pm After 7 pm, please refer to night coverage provider listed on amion.

## 2023-01-30 NOTE — Plan of Care (Signed)
  Problem: Respiratory: Goal: Will maintain a patent airway Outcome: Progressing   Problem: Fluid Volume: Goal: Ability to maintain a balanced intake and output will improve Outcome: Progressing   Problem: Nutritional: Goal: Maintenance of adequate nutrition will improve Outcome: Progressing

## 2023-01-30 NOTE — Plan of Care (Signed)
  Problem: Respiratory: Goal: Will maintain a patent airway Outcome: Progressing Goal: Complications related to the disease process, condition or treatment will be avoided or minimized Outcome: Progressing   Problem: Education: Goal: Knowledge of risk factors and measures for prevention of condition will improve Outcome: Not Progressing

## 2023-01-31 DIAGNOSIS — J9601 Acute respiratory failure with hypoxia: Secondary | ICD-10-CM | POA: Diagnosis not present

## 2023-01-31 DIAGNOSIS — U071 COVID-19: Secondary | ICD-10-CM | POA: Diagnosis not present

## 2023-01-31 DIAGNOSIS — E119 Type 2 diabetes mellitus without complications: Secondary | ICD-10-CM | POA: Diagnosis not present

## 2023-01-31 DIAGNOSIS — I10 Essential (primary) hypertension: Secondary | ICD-10-CM | POA: Diagnosis not present

## 2023-01-31 LAB — CULTURE, BLOOD (ROUTINE X 2)
Culture: NO GROWTH
Culture: NO GROWTH
Special Requests: ADEQUATE
Special Requests: ADEQUATE

## 2023-01-31 LAB — GLUCOSE, CAPILLARY
Glucose-Capillary: 90 mg/dL (ref 70–99)
Glucose-Capillary: 153 mg/dL — ABNORMAL HIGH (ref 70–99)
Glucose-Capillary: 133 mg/dL — ABNORMAL HIGH (ref 70–99)
Glucose-Capillary: 143 mg/dL — ABNORMAL HIGH (ref 70–99)

## 2023-01-31 NOTE — Progress Notes (Signed)
[facility]                                                                              [name], is a 89 y.o. female, DOB - 05/05/1932, [id number] Admit date - 01/25/2023    Outpatient Primary MD for the patient is Mayra Neer, MD  LOS - 5  days  Chief Complaint  Patient presents with   Weakness       Brief summary   Patient is a 89 year old female with dementia, DM type II, HTN, paroxysmal A-fib, on Eliquis, chronic diastolic CHF presented to ED with generalized weakness.  Patient currently resides at [facility] and staff noted that patient was having weakness worsening over the last 2-3 days associated with new onset productive cough and fevers.  No focal weakness.  No shortness of breath, chest pain, diaphoresis or any syncope. In ED, noted to have temp of 100.1 F, HR 70-89, BP 133/92, RR 21-24, noted to have hypoxia in ED, 88% on room air and was placed on 2 L O2 with improvement in sats to 95%. Creatinine 1.18, CK 198, COVID-19 PCR positive. RSV, flu negative.   Assessment & Plan    Principal Problem: Acute respiratory failure with hypoxia secondary to COVID-19 virus infection -Patient noted to have new hypoxia, not on O2 at baseline. -BNP 198, troponin 9, lactic acid 1.1, procalcitonin < 0.1, CRP pending.  Chest x-ray showed no acute infiltrates -Completed remdesivir on 01/28/2023, continue steroids  -O2 weaned off - per SNF policy, they requested patient complete 10-day isolation, likely DC on 3/15 or 3/16   Active Problems: Generalized weakness, debility -Likely due to #1, continue management for COVID -PT OT evaluation-> SNF    Paroxysmal atrial fibrillation (HCC) -HR controlled, continue metoprolol -Continue eliquis    DM2 (diabetes mellitus, type 2) (HCC) -Hemoglobin A1c 5.7 -Continue sliding scale insulin, Tradjenta CBG (last 3)  Recent Labs    01/30/23 2033 01/31/23 0745 01/31/23 1125  GLUCAP 125* 90 153*       Essential  hypertension -Continue metoprolol, added losartan 25 mg daily -Continue hydralazine IV as needed    Chronic diastolic CHF (congestive heart failure) (HCC) -Currently compensated, not on diuretics. -2D echo 07/2021 with EF of 60 to 65%, G1 DD  History of CKD stage IIIa -Creatinine currently at baseline 0.9-1.2 -Creatinine still at baseline, follow  Dementia -Continue on Namenda, Zoloft   Estimated body mass index is 30.65 kg/m as calculated from the following:   Height as of this encounter: '5\' 2"'$  (1.575 m).   Weight as of this encounter: 76 kg.  Code Status: DNR DVT Prophylaxis:  apixaban (ELIQUIS) tablet 2.5 mg Start: 01/26/23 0800 SCDs Start: 01/26/23 0236 apixaban (ELIQUIS) tablet 2.5 mg   Level of Care: Level of care: Med-Surg Family Communication: Updated patient Disposition Plan:      Remains inpatient appropriate:  Per TOC, will require 10-day isolation prior to being able to transfer back to SNF   Procedures:    Consultants:   None  Antimicrobials:   Anti-infectives (From admission, onward)    Start     Dose/Rate Route Frequency Ordered Stop  01/27/23 1000  remdesivir 100 mg in sodium chloride 0.9 % 100 mL IVPB       See Hyperspace for full Linked Orders Report.   100 mg 200 mL/hr over 30 Minutes Intravenous Daily 01/26/23 0238 01/28/23 1107   01/26/23 0330  remdesivir 200 mg in sodium chloride 0.9% 250 mL IVPB       See Hyperspace for full Linked Orders Report.   200 mg 580 mL/hr over 30 Minutes Intravenous Once 01/26/23 0238 01/26/23 0404          Medications  apixaban  2.5 mg Oral BID   benzonatate  200 mg Oral TID   insulin aspart  0-6 Units Subcutaneous TID WC   linagliptin  5 mg Oral Daily   losartan  25 mg Oral Daily   memantine  10 mg Oral BID   metoprolol tartrate  12.5 mg Oral BID   predniSONE  50 mg Oral Daily   sertraline  100 mg Oral QHS      Subjective:   [name] was seen and examined today.  Sitting up in the chair,  watching TV, has dementia, no acute complaints.    Has to complete isolation for 10 days for SNF.   Objective:   Vitals:   01/30/23 1352 01/30/23 1937 01/31/23 0438 01/31/23 0855  BP: (!) 139/116 (!) 117/58 (!) 153/63 (!) 183/73  Pulse: (!) 56 (!) 57 (!) 52 (!) 50  Resp: '18 16 15 16  '$ Temp: 97.7 F (36.5 C) 98.1 F (36.7 C) 98.4 F (36.9 C) 97.9 F (36.6 C)  TempSrc: Oral   Oral  SpO2: 94% 94% 98% 99%  Weight:      Height:        Intake/Output Summary (Last 24 hours) at 01/31/2023 1258 Last data filed at 01/31/2023 0900 Gross per 24 hour  Intake 1072 ml  Output 400 ml  Net 672 ml     Wt Readings from Last 3 Encounters:  01/30/23 76 kg  08/06/22 68 kg  08/03/22 68 kg   Physical Exam General: Alert and oriented x 2 self, NAD, dementia Cardiovascular: S1 S2 clear, RRR.  Respiratory: CTAB Gastrointestinal: Soft, nontender, nondistended, NBS Ext: no pedal edema bilaterally Neuro: no new deficits Psych: underlying dementia  Data Reviewed:  I have personally reviewed following labs    CBC Lab Results  Component Value Date   WBC 9.9 01/28/2023   RBC 4.45 01/28/2023   HGB 12.9 01/28/2023   HCT 40.2 01/28/2023   MCV 90.3 01/28/2023   MCH 29.0 01/28/2023   PLT 230 01/28/2023   MCHC 32.1 01/28/2023   RDW 13.4 01/28/2023   LYMPHSABS 1.2 01/26/2023   MONOABS 0.3 01/26/2023   EOSABS 0.0 01/26/2023   BASOSABS 0.0 Q000111Q     Last metabolic panel Lab Results  Component Value Date   NA 138 01/29/2023   K 4.9 01/29/2023   CL 103 01/29/2023   CO2 25 01/29/2023   BUN 32 (H) 01/29/2023   CREATININE 1.07 (H) 01/29/2023   GLUCOSE 157 (H) 01/29/2023   GFRNONAA 50 (L) 01/29/2023   GFRAA 38 (L) 01/03/2020   CALCIUM 8.9 01/29/2023   PHOS 3.8 01/27/2023   PROT 7.1 01/26/2023   ALBUMIN 3.8 01/26/2023   BILITOT 0.7 01/26/2023   ALKPHOS 87 01/26/2023   AST 21 01/26/2023   ALT 13 01/26/2023   ANIONGAP 10 01/29/2023    CBG (last 3)  Recent Labs     01/30/23 2033 01/31/23 0745 01/31/23 1125  GLUCAP 125* 90 153*      Coagulation Profile: No results for input(s): "INR", "PROTIME" in the last 168 hours.   Radiology Studies: I have personally reviewed the imaging studies  No results found.     Estill Cotta M.D. [facility] 01/31/2023, 12:58 PM  Available via Epic secure chat 7am-7pm After 7 pm, please refer to night coverage provider listed on amion.

## 2023-02-01 DIAGNOSIS — U071 COVID-19: Secondary | ICD-10-CM | POA: Diagnosis not present

## 2023-02-01 LAB — CBC
HCT: 42 % (ref 36.0–46.0)
Hemoglobin: 13.5 g/dL (ref 12.0–15.0)
MCH: 28.4 pg (ref 26.0–34.0)
MCHC: 32.1 g/dL (ref 30.0–36.0)
MCV: 88.2 fL (ref 80.0–100.0)
Platelets: 282 10*3/uL (ref 150–400)
RBC: 4.76 MIL/uL (ref 3.87–5.11)
RDW: 13.2 % (ref 11.5–15.5)
WBC: 14.6 10*3/uL — ABNORMAL HIGH (ref 4.0–10.5)
nRBC: 0 % (ref 0.0–0.2)

## 2023-02-01 LAB — BASIC METABOLIC PANEL
Anion gap: 12 (ref 5–15)
BUN: 40 mg/dL — ABNORMAL HIGH (ref 8–23)
CO2: 25 mmol/L (ref 22–32)
Calcium: 8.7 mg/dL — ABNORMAL LOW (ref 8.9–10.3)
Chloride: 99 mmol/L (ref 98–111)
Creatinine, Ser: 1.17 mg/dL — ABNORMAL HIGH (ref 0.44–1.00)
GFR, Estimated: 45 mL/min — ABNORMAL LOW (ref >60)
Glucose, Bld: 91 mg/dL (ref 70–99)
Potassium: 4.5 mmol/L (ref 3.5–5.1)
Sodium: 136 mmol/L (ref 135–145)

## 2023-02-01 LAB — GLUCOSE, CAPILLARY
Glucose-Capillary: 98 mg/dL (ref 70–99)
Glucose-Capillary: 122 mg/dL — ABNORMAL HIGH (ref 70–99)
Glucose-Capillary: 196 mg/dL — ABNORMAL HIGH (ref 70–99)
Glucose-Capillary: 219 mg/dL — ABNORMAL HIGH (ref 70–99)

## 2023-02-01 MED ORDER — SODIUM CHLORIDE 0.9 % IV SOLN: INTRAVENOUS | Status: DC

## 2023-02-01 MED ORDER — PREDNISONE 20 MG PO TABS
40.0000 mg | ORAL_TABLET | Freq: Every day | ORAL | Status: DC
  Administered 2023-02-02: 40 mg via ORAL
  Filled 2023-02-01: qty 2

## 2023-02-01 NOTE — TOC Progression Note (Signed)
Transition of Care [facility]) - Progression Note    Patient Details  Name: [name] MRN: [id number] Date of Birth: 03-14-1932  Transition of Care [facility] CM/SW [place], [place] Phone Number: 02/01/2023, 2:01 PM  Clinical Narrative:    Sent referrals out for SNF placement. [place] is families top choice for placement. Currently awaiting bed offers. Pt able to transfer to SNF following COVID isolation with planned admission date of 02/06/23.    Expected Discharge Plan: Assisted Living Barriers to Discharge: No Barriers Identified  Expected Discharge Plan and Services In-house Referral: Clinical Social Work Discharge Planning Services: NA Post Acute Care Choice: Resumption of Svcs/PTA Provider Living arrangements for the past 2 months: Assisted Living Facility                 DME Arranged: N/A DME Agency: NA                   Social Determinants of Health (SDOH) Interventions SDOH Screenings   Tobacco Use: Medium Risk (01/26/2023)    Readmission Risk Interventions    01/26/2023    3:00 PM 01/26/2023   11:35 AM  Readmission Risk Prevention Plan  Transportation Screening Complete Complete  PCP or Specialist Appt within 5-7 Days Complete Complete  Home Care Screening  Complete  Medication Review (RN CM)  Complete

## 2023-02-01 NOTE — [place] FL2 (Signed)
[place] MEDICAID FL2 LEVEL OF CARE FORM     IDENTIFICATION  Patient Name: [name] Birthdate: [birth year] Sex: female Admission Date (Current Location): 01/25/2023  [facility] and [place] Number:  Herbalist and Address:  [facility],  [place], [place]      Provider Number: [id number]  Attending Physician Name and Address:  Nita Sells, MD  Relative Name and Phone Number:  Rosalva Ferron [phone]    Current Level of Care: Hospital Recommended Level of Care: [place] Prior Approval Number:    Date Approved/Denied:   PASRR Number: [id number]  Discharge Plan: SNF    Current Diagnoses: Patient Active Problem List   Diagnosis Date Noted   COVID-19 virus infection 01/26/2023   Acute respiratory failure with hypoxia ([place]) 01/26/2023   Generalized weakness 01/26/2023   Chronic diastolic CHF (congestive heart failure) ([place]) 01/26/2023   Acute cystitis 08/07/2022   Acute cystitis without hematuria 08/06/2022   Scalp hematoma 08/06/2022   Fall 08/06/2022   Distal radial fracture 08/06/2022   Hypertensive urgency 08/06/2022   Paroxysmal atrial fibrillation ([place]) 08/06/2022   Chronic anticoagulation 08/06/2022   Acute on chronic renal insufficiency 08/06/2022   (HFpEF) heart failure with preserved ejection fraction ([place]) 08/06/2022   Closed right hip fracture, initial encounter ([place]) 09/26/2021   Head trauma 09/26/2021   Hypocalcemia 09/26/2021   Hyperglycemia [id number]   Acute metabolic encephalopathy [id number]   COVID-19 08/09/2021   History of syncope 08/13/2019   Syncope and collapse 01/23/2019   Leukocytosis 01/23/2019   Diarrhea 01/23/2019   Prolonged QT interval 01/23/2019   Acute confusional state 06/19/2015   Memory loss 06/19/2015   DM2 (diabetes mellitus, type 2) ([place]) 05/14/2015   Hyperlipidemia 05/14/2015   Essential hypertension    TIA (transient ischemic attack)  05/13/2015   Anemia, iron deficiency 12/12/2011   Fracture of proximal end of left humerus 12/11/2011   Hypoxia 12/11/2011   Wide-complex tachycardia 12/11/2011   Acute hypotension 12/11/2011    Orientation RESPIRATION BLADDER Height & Weight     Self, Place  Normal Continent Weight: 167 lb 8.8 oz (76 kg) Height:  '5\' 2"'$  (157.5 cm)  BEHAVIORAL SYMPTOMS/MOOD NEUROLOGICAL BOWEL NUTRITION STATUS      Continent Diet (See discharge summary)  AMBULATORY STATUS COMMUNICATION OF NEEDS Skin   Limited Assist Verbally Normal                       Personal Care Assistance Level of Assistance  Bathing, Feeding, Dressing Bathing Assistance: Limited assistance Feeding assistance: Limited assistance Dressing Assistance: Limited assistance     Functional Limitations Info  Sight, Hearing, Speech Sight Info: Adequate Hearing Info: Adequate Speech Info: Adequate    SPECIAL CARE FACTORS FREQUENCY  PT (By licensed PT), OT (By licensed OT)     PT Frequency: 5x/wk OT Frequency: 5x/wk            Contractures Contractures Info: Not present    Additional Factors Info  Code Status, Allergies Code Status Info: DNR Allergies Info: Sulfa Antibiotics, Sulfasalazine, Latex           Current Medications (02/01/2023):  This is the current hospital active medication list Current Facility-Administered Medications  Medication Dose Route Frequency Provider Last Rate Last Admin   0.9 %  sodium chloride infusion   Intravenous Continuous Nita Sells, MD 50 mL/hr at 02/01/23 1250 New Bag at 02/01/23 1250   acetaminophen (TYLENOL) tablet 650 mg  650 mg Oral Q6H PRN Howerter, Justin B, DO       Or   acetaminophen (TYLENOL) suppository 650 mg  650 mg Rectal Q6H PRN Howerter, Justin B, DO       albuterol (PROVENTIL) (2.5 MG/3ML) 0.083% nebulizer solution 2.5 mg  2.5 mg Nebulization Q4H PRN Howerter, Justin B, DO       apixaban (ELIQUIS) tablet 2.5 mg  2.5 mg Oral BID Howerter, Justin B, DO    2.5 mg at 02/01/23 1004   benzonatate (TESSALON) capsule 200 mg  200 mg Oral TID PRN Howerter, Justin B, DO       benzonatate (TESSALON) capsule 200 mg  200 mg Oral TID Rai, Ripudeep K, MD   200 mg at 02/01/23 1005   hydrALAZINE (APRESOLINE) injection 10 mg  10 mg Intravenous Q6H PRN Rai, Ripudeep K, MD       insulin aspart (novoLOG) injection 0-6 Units  0-6 Units Subcutaneous TID WC Howerter, Justin B, DO   1 Units at 01/31/23 1229   linagliptin (TRADJENTA) tablet 5 mg  5 mg Oral Daily Howerter, Justin B, DO   5 mg at 02/01/23 1004   melatonin tablet 3 mg  3 mg Oral QHS PRN Howerter, Justin B, DO       memantine (NAMENDA) tablet 10 mg  10 mg Oral BID Howerter, Justin B, DO   10 mg at 02/01/23 1005   metoprolol tartrate (LOPRESSOR) tablet 12.5 mg  12.5 mg Oral BID Howerter, Justin B, DO   12.5 mg at 01/31/23 2122   predniSONE (DELTASONE) tablet 50 mg  50 mg Oral Daily Howerter, Justin B, DO   50 mg at 02/01/23 1005   sertraline (ZOLOFT) tablet 100 mg  100 mg Oral QHS Rai, Ripudeep K, MD   100 mg at 01/31/23 2122     Discharge Medications: Please see discharge summary for a list of discharge medications.  Relevant Imaging Results:  Relevant Lab Results:   Additional Information [phone], COVID + on 3/7. Will complete isolation on 3/17. Can transfer 3/18.  Vassie Moselle, LCSW

## 2023-02-01 NOTE — Progress Notes (Signed)
PROGRESS NOTE   [name]  [id number] DOB: 18-Jan-1932 DOA: 01/25/2023 PCP: Mayra Neer, MD  Brief Narrative:  89 year old female DM TY 2 Paroxysmal A-fib/Eliquis--?  Wide-complex tachycardia in the past HFpEF HTN Prior accidental falls 07/2022 with metaphyseal fractures left humeral fracture scalp hematoma Closed right hip fracture status post cephalomedullary nailing 09/30/2021  Admit on 3/7 with 3 days of cough mild fever COVID-positive PCR 3/6 Rx 3 doses remdesivir, steroids started 3/7   Hospital-Problem based course  COVID-19 pneumonia, relatively asymptomatic currently [  required oxygen earlier during hospitalization] - Completed remdesivir 3 doses - Continues on prednisone 50, start taper, stop date 3/16  -Relatively asymptomatic-Cannot discharge until 3/16 to SNF secondary to their COVID policy - Give NS 50 cc/H and discontinue in 1 to 2 days  Paroxysmal A-fib on Eliquis, CHADVASC >4, has bled >3 Paroxysmal wide-complex tachycardia in the past HFpEF last echo  EF 60-65% 07/2021 - Continue metoprolol 12.5 twice daily, Cozaar 12.5 - continue Eliquis 2.5 twice daily given history of falls in 2023 as well as hip fracture in 2022 - Would not dose adjusted upwards Eliquis, will discuss implications of the with family   DM TY 2 A1c 5.7 this admission -CBGs ranging 1 40-1 90 eating 100% of meals -Continue linagliptin 5 daily, sliding scale very sensitive coverage  Mild to moderate dementia - Continue Namenda 10 twice daily, sertraline 100 at bedtime - Can continue melatonin while in hospital  CKD 3 AA -Saline as above, developing mild azotemia -Monitor trends of creatinine in a.m.   DVT prophylaxis: Eliquis 2.5 twice daily Code Status: DNR Family Communication: D/W daughter on phone 3/13 Disposition:  Status is: Inpatient Remains inpatient appropriate because:   Awaiting ability to place patient at facility once 10 days of isolation are  over    Subjective: Somewhat incoherent but pleasantly confused Not combative tries to answer as best as she could Does not know which hospital she is in [place] that she is in [place]-cannot tell me the county  Objective: Vitals:   01/31/23 1314 01/31/23 2122 01/31/23 2158 02/01/23 0457  BP: 116/60 104/70 113/62 129/74  Pulse: (!) 59 63 (!) 58 (!) 53  Resp: '17  16 16  '$ Temp: 97.7 F (36.5 C)  98.4 F (36.9 C) 98.6 F (37 C)  TempSrc:   Oral Oral  SpO2: 96%  95% 96%  Weight:      Height:        Intake/Output Summary (Last 24 hours) at 02/01/2023 1024 Last data filed at 02/01/2023 0457 Gross per 24 hour  Intake 480 ml  Output 850 ml  Net -370 ml   Filed Weights   01/27/23 0700 01/28/23 0500 01/30/23 0500  Weight: 70.2 kg 71 kg 76 kg    Examination:  EOMI NCAT no focal deficit Chest is clear no wheeze rales rhonchi Abdomen soft no rebound no guarding No lower extremity edema Feet not examined S1-S2 no murmur seems to be in sinus on exam  Data Reviewed: personally reviewed   CBC    Component Value Date/Time   WBC 14.6 (H) 02/01/2023 0508   RBC 4.76 02/01/2023 0508   HGB 13.5 02/01/2023 0508   HCT 42.0 02/01/2023 0508   PLT 282 02/01/2023 0508   MCV 88.2 02/01/2023 0508   MCH 28.4 02/01/2023 0508   MCHC 32.1 02/01/2023 0508   RDW 13.2 02/01/2023 0508   LYMPHSABS 1.2 01/26/2023 0554   MONOABS 0.3 01/26/2023 0554   EOSABS 0.0 01/26/2023 0554  BASOSABS 0.0 01/26/2023 0554      Latest Ref Rng & Units 02/01/2023    5:08 AM 01/29/2023    6:16 AM 01/28/2023    7:17 AM  CMP  Glucose 70 - 99 mg/dL 91  157  123   BUN 8 - 23 mg/dL 40  32  36   Creatinine 0.44 - 1.00 mg/dL 1.17  1.07  1.15   Sodium 135 - 145 mmol/L 136  138  133   Potassium 3.5 - 5.1 mmol/L 4.5  4.9  4.1   Chloride 98 - 111 mmol/L 99  103  102   CO2 22 - 32 mmol/L '25  25  23   '$ Calcium 8.9 - 10.3 mg/dL 8.7  8.9  8.2      Radiology Studies: No results found.   Scheduled Meds:  apixaban   2.5 mg Oral BID   benzonatate  200 mg Oral TID   insulin aspart  0-6 Units Subcutaneous TID WC   linagliptin  5 mg Oral Daily   losartan  25 mg Oral Daily   memantine  10 mg Oral BID   metoprolol tartrate  12.5 mg Oral BID   predniSONE  50 mg Oral Daily   sertraline  100 mg Oral QHS   Continuous Infusions:   LOS: 6 days   Time spent: 77 minutes  Nita Sells, MD [facility] To contact the attending provider between 7A-7P or the covering provider during after hours 7P-7A, please log into the web site [email or web address] and access using universal [place] password for that web site. If you do not have the password, please call the hospital operator.  02/01/2023, 10:24 AM

## 2023-02-02 DIAGNOSIS — U071 COVID-19: Secondary | ICD-10-CM | POA: Diagnosis not present

## 2023-02-02 LAB — GLUCOSE, CAPILLARY
Glucose-Capillary: 99 mg/dL (ref 70–99)
Glucose-Capillary: 124 mg/dL — ABNORMAL HIGH (ref 70–99)
Glucose-Capillary: 168 mg/dL — ABNORMAL HIGH (ref 70–99)
Glucose-Capillary: 159 mg/dL — ABNORMAL HIGH (ref 70–99)

## 2023-02-02 LAB — BASIC METABOLIC PANEL
Anion gap: 9 (ref 5–15)
BUN: 34 mg/dL — ABNORMAL HIGH (ref 8–23)
CO2: 26 mmol/L (ref 22–32)
Calcium: 8.4 mg/dL — ABNORMAL LOW (ref 8.9–10.3)
Chloride: 103 mmol/L (ref 98–111)
Creatinine, Ser: 1.19 mg/dL — ABNORMAL HIGH (ref 0.44–1.00)
GFR, Estimated: 44 mL/min — ABNORMAL LOW (ref >60)
Glucose, Bld: 109 mg/dL — ABNORMAL HIGH (ref 70–99)
Potassium: 4.5 mmol/L (ref 3.5–5.1)
Sodium: 138 mmol/L (ref 135–145)

## 2023-02-02 MED ORDER — PREDNISONE 20 MG PO TABS
20.0000 mg | ORAL_TABLET | Freq: Every day | ORAL | Status: DC
  Administered 2023-02-03: 20 mg via ORAL
  Filled 2023-02-02: qty 1

## 2023-02-02 NOTE — TOC Progression Note (Addendum)
Transition of Care [facility]) - Progression Note    Patient Details  Name: [name] MRN: [id number] Date of Birth: 1932-01-16  Transition of Care [facility]) CM/SW [place], LCSW Phone Number: 02/02/2023, 12:49 PM  Clinical Narrative:    Reviewed bed offers with pt's daughter. Accepted offer for [facility]  Called [facility] to inform of pt's discharge plans.   Update 1:40pm- [facility] possibly able to accept pt over weekend. Will need to contact tomorrow and on weekend to confirm bed availability.   Expected Discharge Plan: Assisted Living Barriers to Discharge: No Barriers Identified  Expected Discharge Plan and Services In-house Referral: Clinical Social Work Discharge Planning Services: NA Post Acute Care Choice: Resumption of Svcs/PTA Provider Living arrangements for the past 2 months: Assisted Living Facility                 DME Arranged: N/A DME Agency: NA                   Social Determinants of Health (SDOH) Interventions SDOH Screenings   Tobacco Use: Medium Risk (01/26/2023)    Readmission Risk Interventions    01/26/2023    3:00 PM 01/26/2023   11:35 AM  Readmission Risk Prevention Plan  Transportation Screening Complete Complete  PCP or Specialist Appt within 5-7 Days Complete Complete  Home Care Screening  Complete  Medication Review (RN CM)  Complete

## 2023-02-02 NOTE — Progress Notes (Signed)
PROGRESS NOTE   [name]  [name] DOB: Sep 08, 1932 DOA: 01/25/2023 PCP: Monique Neer, MD  Brief Narrative:  89 year old female DM TY 2 Paroxysmal A-fib/Eliquis--?  Wide-complex tachycardia in the past HFpEF HTN Prior accidental falls 07/2022 with metaphyseal fractures left humeral fracture scalp hematoma Closed right hip fracture status post cephalomedullary nailing 09/30/2021  Admit on 3/7 with 3 days of cough mild fever COVID-positive PCR 3/6 Rx 3 doses remdesivir, steroids started 3/7   Hospital-Problem based course  COVID-19 pneumonia, relatively asymptomatic currently [  required oxygen earlier during hospitalization] - Completed remdesivir 3 doses - Continues on prednisone 50, start taper, stop date 3/16  - Relatively asymptomatic-Cannot discharge until 3/16 to SNF secondary to their COVID policy  Paroxysmal A-fib on Eliquis, CHADVASC >4, has bled >3 Paroxysmal wide-complex tachycardia in the past HFpEF last echo  EF 60-65% 07/2021 - Continue metoprolol 12.5 twice daily, Cozaar 12.5 - continue Eliquis 2.5 twice daily given history of falls in 2023 as well as hip fracture in 2022 - Would not dose adjusted upwards Eliquis, family understand and accept small per year risk of CVA > large risk of fall and bleeding   DM TY 2 A1c 5.7 this admission - CBGs ranging 120-220 eating 100% of meals - Continue linagliptin 5 daily, sliding scale very sensitive coverage  Mild to moderate dementia - Continue Namenda 10 twice daily, sertraline 100 at bedtime - Can continue melatonin while in hospital  CKD 3 AA - Saline as above, developing mild azotemia as not keeping up with fluids.  Improved some   DVT prophylaxis: Eliquis 2.5 twice daily Code Status: DNR Family Communication: D/W daughter on phone 3/13 Disposition:  Status is: Inpatient Remains inpatient appropriate because:   Awaiting ability to place patient at facility once 10 days of isolation are  over    Subjective:  No distress no cp fever Ate ~ 30% meals   Objective: Vitals:   02/01/23 2131 02/02/23 0500 02/02/23 0608 02/02/23 1314  BP: (!) 118/52  (!) 143/67 135/61  Pulse: 72  (!) 57 (!) 58  Resp: '18  16 16  '$ Temp: 97.9 F (36.6 C)  98.1 F (36.7 C) 98.4 F (36.9 C)  TempSrc: Oral  Oral Oral  SpO2:   95% 94%  Weight:  74.6 kg    Height:       No intake or output data in the 24 hours ending 02/02/23 1323  Filed Weights   01/28/23 0500 01/30/23 0500 02/02/23 0500  Weight: 71 kg 76 kg 74.6 kg    Examination:  EOMI NCAT -midly confused [baseline] Cta b no added sound Abdomen soft no rebound no guarding No lower extremity edema S1-S2 no murmur NSR on exam  Data Reviewed: personally reviewed   CBC    Component Value Date/Time   WBC 14.6 (H) 02/01/2023 0508   RBC 4.76 02/01/2023 0508   HGB 13.5 02/01/2023 0508   HCT 42.0 02/01/2023 0508   PLT 282 02/01/2023 0508   MCV 88.2 02/01/2023 0508   MCH 28.4 02/01/2023 0508   MCHC 32.1 02/01/2023 0508   RDW 13.2 02/01/2023 0508   LYMPHSABS 1.2 01/26/2023 0554   MONOABS 0.3 01/26/2023 0554   EOSABS 0.0 01/26/2023 0554   BASOSABS 0.0 01/26/2023 0554      Latest Ref Rng & Units 02/02/2023    5:31 AM 02/01/2023    5:08 AM 01/29/2023    6:16 AM  CMP  Glucose 70 - 99 mg/dL 109  91  157  BUN 8 - 23 mg/dL 34  40  32   Creatinine 0.44 - 1.00 mg/dL 1.19  1.17  1.07   Sodium 135 - 145 mmol/L 138  136  138   Potassium 3.5 - 5.1 mmol/L 4.5  4.5  4.9   Chloride 98 - 111 mmol/L 103  99  103   CO2 22 - 32 mmol/L '26  25  25   '$ Calcium 8.9 - 10.3 mg/dL 8.4  8.7  8.9      Radiology Studies: No results found.   Scheduled Meds:  apixaban  2.5 mg Oral BID   benzonatate  200 mg Oral TID   insulin aspart  0-6 Units Subcutaneous TID WC   linagliptin  5 mg Oral Daily   memantine  10 mg Oral BID   metoprolol tartrate  12.5 mg Oral BID   [START ON 02/03/2023] predniSONE  20 mg Oral Daily   sertraline  100 mg Oral QHS    Continuous Infusions:  sodium chloride 50 mL/hr at 02/02/23 0826     LOS: 7 days   Time spent: 25 minutes  Nita Sells, MD [facility] To contact the attending provider between 7A-7P or the covering provider during after hours 7P-7A, please log into the web site [email or web address] and access using universal  password for that web site. If you do not have the password, please call the hospital operator.  02/02/2023, 1:23 PM

## 2023-02-02 NOTE — Progress Notes (Signed)
Mobility Specialist - Progress Note   02/02/23 1000  Mobility  Activity Transferred to/from [facility];Transferred from bed to chair  Level of Assistance Minimal assist, patient does 75% or more  Assistive Device Front wheel walker  Distance Ambulated (ft) 15 ft  Activity Response Tolerated well  Mobility Referral No  $Mobility charge 1 Mobility   Pt received halfway out of bed, alarm going off. Assisted pt to [facility], after assisting in patient care returned pt to chair with alarm on.   Roderick Pee Mobility Specialist

## 2023-02-03 DIAGNOSIS — U071 COVID-19: Secondary | ICD-10-CM | POA: Diagnosis not present

## 2023-02-03 LAB — BASIC METABOLIC PANEL
Anion gap: 10 (ref 5–15)
BUN: 30 mg/dL — ABNORMAL HIGH (ref 8–23)
CO2: 26 mmol/L (ref 22–32)
Calcium: 8.7 mg/dL — ABNORMAL LOW (ref 8.9–10.3)
Chloride: 103 mmol/L (ref 98–111)
Creatinine, Ser: 1.05 mg/dL — ABNORMAL HIGH (ref 0.44–1.00)
GFR, Estimated: 51 mL/min — ABNORMAL LOW (ref >60)
Glucose, Bld: 93 mg/dL (ref 70–99)
Potassium: 4.3 mmol/L (ref 3.5–5.1)
Sodium: 139 mmol/L (ref 135–145)

## 2023-02-03 LAB — GLUCOSE, CAPILLARY
Glucose-Capillary: 81 mg/dL (ref 70–99)
Glucose-Capillary: 104 mg/dL — ABNORMAL HIGH (ref 70–99)
Glucose-Capillary: 141 mg/dL — ABNORMAL HIGH (ref 70–99)
Glucose-Capillary: 122 mg/dL — ABNORMAL HIGH (ref 70–99)

## 2023-02-03 MED ORDER — PREDNISONE 10 MG PO TABS
10.0000 mg | ORAL_TABLET | Freq: Every day | ORAL | Status: DC
  Administered 2023-02-04 – 2023-02-05 (×2): 10 mg via ORAL
  Filled 2023-02-03 (×2): qty 1

## 2023-02-03 NOTE — TOC Progression Note (Signed)
Transition of Care [facility]) - Progression Note    Patient Details  Name: [name] MRN: [id number] Date of Birth: 04-10-1932  Transition of Care [facility]) CM/SW [place], [place] Phone Number: 02/03/2023, 3:53 PM  Clinical Narrative:     CSW received call from [facility] requesting to come evaluate pt today to consider offering bed for pt. Upon discussion, CSW is informed that [facility] would not be able to admit pt tomorrow as they would require pt to have a private room for covid isolation and don't have a private room available. [facility] liaison is unsure if they could admit Sunday. Pt already has a bed available on Sunday with a facility of family's choice, [facility]. No need for evaluation at this time as pt/family chose a different facility and [facility] won't be able to admit until Sunday.   Expected Discharge Plan: Assisted Living Barriers to Discharge: No Barriers Identified  Expected Discharge Plan and Services In-house Referral: Clinical Social Work Discharge Planning Services: NA Post Acute Care Choice: Resumption of Svcs/PTA Provider Living arrangements for the past 2 months: Assisted Living Facility                 DME Arranged: N/A DME Agency: NA                   Social Determinants of Health (SDOH) Interventions SDOH Screenings   Tobacco Use: Medium Risk (01/26/2023)    Readmission Risk Interventions    01/26/2023    3:00 PM 01/26/2023   11:35 AM  Readmission Risk Prevention Plan  Transportation Screening Complete Complete  PCP or Specialist Appt within 5-7 Days Complete Complete  Home Care Screening  Complete  Medication Review (RN CM)  Complete

## 2023-02-03 NOTE — Progress Notes (Signed)
Physical Therapy Treatment Patient Details Name: [name] MRN: [id number] DOB: 1932/03/17 Today's Date: 02/03/2023   History of Present Illness Pt is an 89 y.o. female admitted to [facility] on 01/25/2023 with severe COVID-19 infection after presenting from SNF to [facility] ED complaining of generalized weakness. medical history significant for dementia, type 2 diabetes mellitus, essential hypertension, paroxysmal atrial fibrillation chronically anticoagulated on Eliquis, and chronic diastolic heart failure.    PT Comments     Pt admitted secondary to problem above with deficits below. Patient was residing at ALF PLOF. Pt seated in recliner when PT arrived. PT noted pt had incontinence episode and able to assist. Pt currently requires min guard for transfer from recliner and min A from commode, min guard for gait tasks limited secondary to reports of fatigue 18 feet x 2 with cues, static standing with min guard and 1 UE support. PT under impression pt will d/c to SNF over the weekend. If pt remains anticipate patient will benefit from continued PT to address problems listed below.Will continue to follow acutely to maximize functional mobility independence and safety.     Recommendations for follow up therapy are one component of a multi-disciplinary discharge planning process, led by the attending physician.  Recommendations may be updated based on patient status, additional functional criteria and insurance authorization.  Follow Up Recommendations  Skilled nursing-short term rehab (<3 hours/day) Can patient physically be transported by private vehicle: Yes   Assistance Recommended at Discharge Frequent or constant Supervision/Assistance  Patient can return home with the following A lot of help with walking and/or transfers;A lot of help with bathing/dressing/bathroom;Help with stairs or ramp for entrance;Assist for transportation;Direct supervision/assist for financial management;Direct  supervision/assist for medications management;Assistance with cooking/housework;A little help with walking and/or transfers   Equipment Recommendations  None recommended by PT (defer to next level of care)    Recommendations for Other Services       Precautions / Restrictions Precautions Precautions: Fall Restrictions Weight Bearing Restrictions: No     Mobility  Bed Mobility               General bed mobility comments: pt in recliner when PT arrived    Transfers Overall transfer level: Needs assistance Equipment used: None Transfers: Sit to/from Stand Sit to Stand: Min guard           General transfer comment: pt required min guard from recliner and min A from commode cues for proper hand and AD placement    Ambulation/Gait Ambulation/Gait assistance: Min guard Gait Distance (Feet): 18 Feet (18 ( x 2)) Assistive device: Rolling walker (2 wheels) Gait Pattern/deviations: Decreased stride length, Shuffle, Trunk flexed Gait velocity: decreased     General Gait Details: pt requried cues for maintaining proper body position inside RW, direction and safety with turns and obstacle navigation   Stairs             Wheelchair Mobility    Modified Rankin (Stroke Patients Only)       Balance Overall balance assessment: Needs assistance Sitting-balance support: Feet supported, Single extremity supported Sitting balance-Leahy Scale: Fair     Standing balance support: Single extremity supported (reliant on RW for gait tasks and transfers static standing 1 UE support for hygine tasks) Standing balance-Leahy Scale: Poor                              Cognition Arousal/Alertness: Awake/alert Behavior During Therapy:  Flat affect Overall Cognitive Status: History of cognitive impairments - at baseline                                 General Comments: improved eagement and commuication        Exercises      General Comments         Pertinent Vitals/Pain Pain Assessment Pain Assessment: No/denies pain    Home Living Family/patient expects to be discharged to:: Assisted living                   Additional Comments: Per chart review, pt resides at [facility]. 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 Pt typically able to walk independently at baseline.    Prior Function            PT Goals (current goals can now be found in the care plan section) Acute Rehab PT Goals Patient Stated Goal: to get back home PT Goal Formulation: With patient Time For Goal Achievement: 02/09/23 Potential to Achieve Goals: Good    Frequency    Min 2X/week      PT Plan      Co-evaluation              AM-PAC PT "6 Clicks" Mobility   Outcome Measure  Help needed turning from your back to your side while in a flat bed without using bedrails?: A Little Help needed moving from lying on your back to sitting on the side of a flat bed without using bedrails?: A Lot Help needed moving to and from a bed to a chair (including a wheelchair)?: A Little Help needed standing up from a chair using your arms (e.g., wheelchair or bedside chair)?: A Little Help needed to walk in hospital room?: A Little Help needed climbing 3-5 steps with a railing? : Total 6 Click Score: 15    End of Session Equipment Utilized During Treatment: Gait belt Activity Tolerance: Patient tolerated treatment well Patient left: with chair alarm set;with call bell/phone within reach Nurse Communication: Mobility status PT Visit Diagnosis: Unsteadiness on feet (R26.81);Muscle weakness (generalized) (M62.81)     Time: [id number] PT Time Calculation (min) (ACUTE ONLY): 29 min  Charges:  $Gait Training: 8-22 mins $Therapeutic Activity: 8-22 mins                     Baird Lyons, PT   Adair Patter 02/03/2023, 3:52 PM

## 2023-02-03 NOTE — TOC Progression Note (Signed)
Transition of Care [facility]) - Progression Note    Patient Details  Name: [name] MRN: [id number] Date of Birth: [birth year]  Transition [facility]) CM/SW Contact  Joaquin Courts, RN Phone Number: 02/03/2023, 3:53 PM  Clinical Narrative:    CM received call from [facility] stating earliest possible acceptance is Sunday 3/17.  Patient already with a bed at [facility] for Sunday 3/17, [facility] remains as patients facility choice at this time.   Expected Discharge Plan: Assisted Living Barriers to Discharge: No Barriers Identified  Expected Discharge Plan and Services In-house Referral: Clinical Social Work Discharge Planning Services: NA Post Acute Care Choice: Resumption of Svcs/PTA Provider Living arrangements for the past 2 months: Assisted Living Facility                 DME Arranged: N/A DME Agency: NA                   Social Determinants of Health (SDOH) Interventions SDOH Screenings   Tobacco Use: Medium Risk (01/26/2023)    Readmission Risk Interventions    01/26/2023    3:00 PM 01/26/2023   11:35 AM  Readmission Risk Prevention Plan  Transportation Screening Complete Complete  PCP or Specialist Appt within 5-7 Days Complete Complete  Home Care Screening  Complete  Medication Review (RN CM)  Complete

## 2023-02-03 NOTE — Progress Notes (Signed)
PROGRESS NOTE   [name]  [name] DOB: 1932-06-23 DOA: 01/25/2023 PCP: Mayra Neer, MD  Brief Narrative:  89 year old female DM TY 2 Paroxysmal A-fib/Eliquis--?  Wide-complex tachycardia in the past HFpEF HTN Prior accidental falls 07/2022 with metaphyseal fractures left humeral fracture scalp hematoma Closed right hip fracture status post cephalomedullary nailing 09/30/2021  Admit on 3/7 with 3 days of cough mild fever COVID-positive PCR 3/6 Rx 3 doses remdesivir, steroids started 3/7   Hospital-Problem based course  COVID-19 pneumonia, relatively asymptomatic currently [  required oxygen earlier during hospitalization] - Completed remdesivir 3 doses - Continues on prednisone start taper, stop date 3/16  - Relatively asymptomatic-Cannot discharge until 3/16 to SNF 2/2 COVID policy  Paroxysmal A-fib on Eliquis, CHADVASC >4, has bled >3 Paroxysmal wide-complex tachycardia in the past HFpEF last echo  EF 60-65% 07/2021 - Continue metoprolol 12.5 twice daily, Cozaar 12.5 - continue [lower dosing] Eliquis 2.5 twice daily given history of falls in 2023 as well as hip fracture in 2022 - Would not dose adjusted upwards Eliquis, family understand and accept small per year risk of CVA > large risk of fall and bleeding   DM TY 2 A1c 5.7 this admission - CBGs ranging  81-141 eating 100% of meals - Continue linagliptin 5 daily, sliding scale very sensitive coverage  Mild to moderate dementia - Continue Namenda 10 twice daily, sertraline 100 at bedtime - =melatonin while in hospital  AKI superimposed on CKD 3 AA - Ns 50 cc/h-- improving mild azotemia as not keeping up with fluids.  Improved some   DVT prophylaxis: Eliquis 2.5 twice daily Code Status: DNR Family Communication: called daughter on phone 3/15--updated fully Disposition:  Status is: Inpatient Remains inpatient appropriate because:   Patient's daughter is unable to take the patient from the hospital and  cannot coordinate any acceptance to the skilled facility until Monday as she is already made those arrangements so we will just plan to admit the patient on Monday 3/18  this is an unavoidable delay brought about by the circumstances of patient's disposition and social situation    Subjective:  Disoriented to some degree but pleasantly confused-could not recall what she was reading which she had just read in the chair Looks like she was 1 assist No fever no chills noted and seems to be otherwise well  Objective: Vitals:   02/02/23 1314 02/02/23 1952 02/03/23 0442 02/03/23 1424  BP: 135/61 (!) 125/50 (!) 140/78 (!) 132/59  Pulse: (!) 58 63 (!) 51 62  Resp: 16 15 20 18   Temp: 98.4 F (36.9 C) (!) 97.5 F (36.4 C) 98.2 F (36.8 C) 98.5 F (36.9 C)  TempSrc: Oral Oral  Oral  SpO2: 94% 94% 97% 94%  Weight:      Height:       No intake or output data in the 24 hours ending 02/03/23 1714  Filed Weights   01/28/23 0500 01/30/23 0500 02/02/23 0500  Weight: 71 kg 76 kg 74.6 kg    Examination:  EOMI NCAT -midly confused [baseline] Chest is there are no wheeze rales rhonchi Abdomen is soft nontender No lower extremity edema S1-S2 no murmur  Data Reviewed: personally reviewed   CBC    Component Value Date/Time   WBC 14.6 (H) 02/01/2023 0508   RBC 4.76 02/01/2023 0508   HGB 13.5 02/01/2023 0508   HCT 42.0 02/01/2023 0508   PLT 282 02/01/2023 0508   MCV 88.2 02/01/2023 0508   MCH 28.4 02/01/2023 0508  MCHC 32.1 02/01/2023 0508   RDW 13.2 02/01/2023 0508   LYMPHSABS 1.2 01/26/2023 0554   MONOABS 0.3 01/26/2023 0554   EOSABS 0.0 01/26/2023 0554   BASOSABS 0.0 01/26/2023 0554      Latest Ref Rng & Units 02/03/2023    6:36 AM 02/02/2023    5:31 AM 02/01/2023    5:08 AM  CMP  Glucose 70 - 99 mg/dL 93  109  91   BUN 8 - 23 mg/dL 30  34  40   Creatinine 0.44 - 1.00 mg/dL 1.05  1.19  1.17   Sodium 135 - 145 mmol/L 139  138  136   Potassium 3.5 - 5.1 mmol/L 4.3  4.5  4.5    Chloride 98 - 111 mmol/L 103  103  99   CO2 22 - 32 mmol/L 26  26  25    Calcium 8.9 - 10.3 mg/dL 8.7  8.4  8.7      Radiology Studies: No results found.   Scheduled Meds:  apixaban  2.5 mg Oral BID   benzonatate  200 mg Oral TID   insulin aspart  0-6 Units Subcutaneous TID WC   linagliptin  5 mg Oral Daily   memantine  10 mg Oral BID   metoprolol tartrate  12.5 mg Oral BID   predniSONE  20 mg Oral Daily   sertraline  100 mg Oral QHS   Continuous Infusions:  sodium chloride 50 mL/hr at 02/02/23 1634     LOS: 8 days   Time spent: 25 minutes  Nita Sells, MD [facility] To contact the attending provider between 7A-7P or the covering provider during after hours 7P-7A, please log into the web site [email or web address] and access using universal [place] password for that web site. If you do not have the password, please call the hospital operator.  02/03/2023, 5:14 PM

## 2023-02-03 NOTE — TOC Progression Note (Signed)
Transition of Care [facility]) - Progression Note    Patient Details  Name: [name] MRN: [id number] Date of Birth: [birth year]  Transition of Care [facility]) CM/SW [place], [place] Phone Number: 02/03/2023, 3:24 PM  Clinical Narrative:     CSW confirmed with [facility] that pt cannot admit until after 10day isolation per their policy. The date of the positive covid test counts as day 0 so they can admit pt on Sunday 02/05/23  Expected Discharge Plan: Assisted Living Barriers to Discharge: No Barriers Identified  Expected Discharge Plan and Services In-house Referral: Clinical Social Work Discharge Planning Services: NA Post Acute Care Choice: Resumption of Svcs/PTA Provider Living arrangements for the past 2 months: Assisted Living Facility                 DME Arranged: N/A DME Agency: NA                   Social Determinants of Health (SDOH) Interventions SDOH Screenings   Tobacco Use: Medium Risk (01/26/2023)    Readmission Risk Interventions    01/26/2023    3:00 PM 01/26/2023   11:35 AM  Readmission Risk Prevention Plan  Transportation Screening Complete Complete  PCP or Specialist Appt within 5-7 Days Complete Complete  Home Care Screening  Complete  Medication Review (RN CM)  Complete

## 2023-02-04 DIAGNOSIS — U071 COVID-19: Secondary | ICD-10-CM | POA: Diagnosis not present

## 2023-02-04 LAB — CBC WITH DIFFERENTIAL/PLATELET
Abs Immature Granulocytes: 0.22 10*3/uL — ABNORMAL HIGH (ref 0.00–0.07)
Basophils Absolute: 0 10*3/uL (ref 0.0–0.1)
Basophils Relative: 0 %
Eosinophils Absolute: 0.1 10*3/uL (ref 0.0–0.5)
Eosinophils Relative: 1 %
HCT: 39.7 % (ref 36.0–46.0)
Hemoglobin: 12.7 g/dL (ref 12.0–15.0)
Immature Granulocytes: 2 %
Lymphocytes Relative: 27 %
Lymphs Abs: 3 10*3/uL (ref 0.7–4.0)
MCH: 28.9 pg (ref 26.0–34.0)
MCHC: 32 g/dL (ref 30.0–36.0)
MCV: 90.2 fL (ref 80.0–100.0)
Monocytes Absolute: 1 10*3/uL (ref 0.1–1.0)
Monocytes Relative: 9 %
Neutro Abs: 7.1 10*3/uL (ref 1.7–7.7)
Neutrophils Relative %: 61 %
Platelets: 265 10*3/uL (ref 150–400)
RBC: 4.4 MIL/uL (ref 3.87–5.11)
RDW: 13.2 % (ref 11.5–15.5)
WBC: 11.4 10*3/uL — ABNORMAL HIGH (ref 4.0–10.5)
nRBC: 0 % (ref 0.0–0.2)

## 2023-02-04 LAB — GLUCOSE, CAPILLARY
Glucose-Capillary: 116 mg/dL — ABNORMAL HIGH (ref 70–99)
Glucose-Capillary: 142 mg/dL — ABNORMAL HIGH (ref 70–99)
Glucose-Capillary: 132 mg/dL — ABNORMAL HIGH (ref 70–99)
Glucose-Capillary: 111 mg/dL — ABNORMAL HIGH (ref 70–99)

## 2023-02-04 LAB — COMPREHENSIVE METABOLIC PANEL
ALT: 19 U/L (ref 0–44)
AST: 15 U/L (ref 15–41)
Albumin: 3.1 g/dL — ABNORMAL LOW (ref 3.5–5.0)
Alkaline Phosphatase: 80 U/L (ref 38–126)
Anion gap: 7 (ref 5–15)
BUN: 27 mg/dL — ABNORMAL HIGH (ref 8–23)
CO2: 28 mmol/L (ref 22–32)
Calcium: 8.5 mg/dL — ABNORMAL LOW (ref 8.9–10.3)
Chloride: 101 mmol/L (ref 98–111)
Creatinine, Ser: 1.05 mg/dL — ABNORMAL HIGH (ref 0.44–1.00)
GFR, Estimated: 51 mL/min — ABNORMAL LOW (ref >60)
Glucose, Bld: 87 mg/dL (ref 70–99)
Potassium: 4.3 mmol/L (ref 3.5–5.1)
Sodium: 136 mmol/L (ref 135–145)
Total Bilirubin: 0.6 mg/dL (ref 0.3–1.2)
Total Protein: 5.9 g/dL — ABNORMAL LOW (ref 6.5–8.1)

## 2023-02-04 NOTE — Progress Notes (Addendum)
PROGRESS NOTE   [name]  [name] DOB: 11/28/1931 DOA: 01/25/2023 PCP: Mayra Neer, MD  Brief Narrative:   89 year old female  DM TY 2 Paroxy A-fib/Eliquis--?  Wide-complex tachycardia in the past HFpEF HTN Prior falls 07/2022 with metaphyseal fractures left humeral fracture scalp hematoma Closed right hip fracture status post cephalomedullary nailing 09/30/2021  Admit on 3/7 with 3 days of cough mild fever COVID-positive PCR 3/6 Rx 3 doses remdesivir, steroids started 3/7  Patient has remained durably stable over the past several days while awaiting skilled facility to accept her to their facility outside of the 10-day COVID window as per old CDC guidelines-hospital CMO is aware of this unavoidable delay   Hospital-Problem based course  COVID-19 pneumonia, relatively asymptomatic currently [  required oxygen earlier during hospitalization] - Completed remdesivir 3 doses - Pleated prednisone taper 3/16 - asymptomatic  Paroxysmal A-fib on Eliquis, CHADVASC >4, has bled >3 Paroxysmal wide-complex tachycardia in the past HFpEF last echo  EF 60-65% 07/2021 - Continue metoprolol 12.5 twice daily, Cozaar 12.5 - continue [lower dosing] Eliquis 2.5 twice daily given history of falls in 2023 as well as hip fracture in 2022 - Do not increase Eliquis dose, family understand and accept small per year risk of CVA > large risk of fall and bleeding   DM TY 2 A1c 5.7 this admission - CBGs ranging 80-140 eating 100% of meals - Continue linagliptin 5 daily, sliding scale very sensitive coverage  Mild to moderate dementia - Continue Namenda 10 twice daily, sertraline 100 at bedtime - melatonin while in hospital  AKI superimposed on CKD 3 AA - Normal saline started on 3/14 - Will saline lock today and see how patient is able to keep up with fluids -Force fluids - She can come off precautions later today   DVT prophylaxis: Eliquis 2.5 twice daily Code Status: DNR Family  Communication: none Disposition:  Status is: Inpatient Remains inpatient appropriate because:   Patient's daughter is unable to take the patient from the hospital and cannot coordinate until Monday  we will just plan to admit the patient on Monday 3/18  this is an unavoidable delay n    Subjective:  [place] pleasantly confused and quite redirectable Sitting in chair having breakfast  Objective: Vitals:   02/03/23 1424 02/03/23 2141 02/04/23 0531 02/04/23 1655  BP: (!) 132/59 (!) 140/67 (!) 186/70 125/60  Pulse: 62 (!) 56 (!) 55 (!) 56  Resp: 18 18 18 16   Temp: 98.5 F (36.9 C) 97.8 F (36.6 C) 98.5 F (36.9 C) 98.6 F (37 C)  TempSrc: Oral Oral Oral Oral  SpO2: 94% 96% 99% 97%  Weight:   77.2 kg   Height:        Intake/Output Summary (Last 24 hours) at 02/04/2023 1730 Last data filed at 02/04/2023 1146 Gross per 24 hour  Intake 3255.68 ml  Output 750 ml  Net 2505.68 ml    Filed Weights   01/30/23 0500 02/02/23 0500 02/04/23 0531  Weight: 76 kg 74.6 kg 77.2 kg    Examination:  EOMI NCAT -pleasant today Chest clear does not have wheeze rales rhonchi Abdomen is soft nontender No lower extremity edema S1-S2 no murmur Did not examine skin today  Data Reviewed: personally reviewed   CBC    Component Value Date/Time   WBC 11.4 (H) 02/04/2023 0657   RBC 4.40 02/04/2023 0657   HGB 12.7 02/04/2023 0657   HCT 39.7 02/04/2023 0657   PLT 265 02/04/2023 0657  MCV 90.2 02/04/2023 0657   MCH 28.9 02/04/2023 0657   MCHC 32.0 02/04/2023 0657   RDW 13.2 02/04/2023 0657   LYMPHSABS 3.0 02/04/2023 0657   MONOABS 1.0 02/04/2023 0657   EOSABS 0.1 02/04/2023 0657   BASOSABS 0.0 02/04/2023 0657      Latest Ref Rng & Units 02/04/2023    6:57 AM 02/03/2023    6:36 AM 02/02/2023    5:31 AM  CMP  Glucose 70 - 99 mg/dL 87  93  109   BUN 8 - 23 mg/dL 27  30  34   Creatinine 0.44 - 1.00 mg/dL 1.05  1.05  1.19   Sodium 135 - 145 mmol/L 136  139  138   Potassium 3.5 - 5.1  mmol/L 4.3  4.3  4.5   Chloride 98 - 111 mmol/L 101  103  103   CO2 22 - 32 mmol/L 28  26  26    Calcium 8.9 - 10.3 mg/dL 8.5  8.7  8.4   Total Protein 6.5 - 8.1 g/dL 5.9     Total Bilirubin 0.3 - 1.2 mg/dL 0.6     Alkaline Phos 38 - 126 U/L 80     AST 15 - 41 U/L 15     ALT 0 - 44 U/L 19        Radiology Studies: No results found.   Scheduled Meds:  apixaban  2.5 mg Oral BID   benzonatate  200 mg Oral TID   insulin aspart  0-6 Units Subcutaneous TID WC   linagliptin  5 mg Oral Daily   memantine  10 mg Oral BID   metoprolol tartrate  12.5 mg Oral BID   predniSONE  10 mg Oral Daily   sertraline  100 mg Oral QHS   Continuous Infusions:    LOS: 9 days   Time spent: 15 minutes  Nita Sells, MD [facility] To contact the attending provider between 7A-7P or the covering provider during after hours 7P-7A, please log into the web site [email or web address] and access using universal [place] password for that web site. If you do not have the password, please call the hospital operator.  02/04/2023, 5:30 PM

## 2023-02-05 LAB — GLUCOSE, CAPILLARY
Glucose-Capillary: 86 mg/dL (ref 70–99)
Glucose-Capillary: 127 mg/dL — ABNORMAL HIGH (ref 70–99)
Glucose-Capillary: 150 mg/dL — ABNORMAL HIGH (ref 70–99)
Glucose-Capillary: 148 mg/dL — ABNORMAL HIGH (ref 70–99)

## 2023-02-05 NOTE — Progress Notes (Signed)
Patient will discharge 3/18-please see dictated discharge summary from 3/17

## 2023-02-06 DIAGNOSIS — U071 COVID-19: Secondary | ICD-10-CM | POA: Diagnosis not present

## 2023-02-06 LAB — GLUCOSE, CAPILLARY
Glucose-Capillary: 91 mg/dL (ref 70–99)
Glucose-Capillary: 141 mg/dL — ABNORMAL HIGH (ref 70–99)

## 2023-02-06 MED ORDER — BENZONATATE 200 MG PO CAPS: 200.0000 mg | ORAL_CAPSULE | Freq: Three times a day (TID) | ORAL | 0 refills | Status: AC

## 2023-02-06 MED ORDER — PREDNISONE 10 MG PO TABS: 10.0000 mg | ORAL_TABLET | Freq: Every day | ORAL | 0 refills | Status: DC

## 2023-02-06 MED ORDER — SERTRALINE HCL 100 MG PO TABS: 100.0000 mg | ORAL_TABLET | Freq: Every day | ORAL | 0 refills | Status: AC

## 2023-02-06 MED ORDER — LINAGLIPTIN 5 MG PO TABS: 5.0000 mg | ORAL_TABLET | Freq: Every day | ORAL | 0 refills | Status: AC

## 2023-02-06 MED ORDER — MEMANTINE HCL 10 MG PO TABS: 10.0000 mg | ORAL_TABLET | Freq: Two times a day (BID) | ORAL | 0 refills | Status: AC

## 2023-02-06 NOTE — TOC Transition Note (Signed)
Transition of Care [facility]) - CM/SW Discharge Note   Patient Details  Name: [name] MRN: [id number] Date of Birth: 03/13/1932  Transition of Care [facility]) CM/SW Contact:  Bethann Berkshire, [place] Phone Number: 02/06/2023, 10:12 AM   Clinical Narrative:     Patient will DC to: [facility] Anticipated DC date: 02/06/23 Family notified: Daughter [name] Transport by: Daughter   Per MD patient ready for DC to [facility]. RN, patient, patient's family, and facility notified of DC. Discharge Summary and FL2 sent to facility. RN to call report prior to discharge [phone] room 605p).   CSW will sign off for now as social work intervention is no longer needed. Please consult [place] again if new needs arise.   Final next level of care: Skilled Nursing Facility Barriers to Discharge: No Barriers Identified   Patient Goals and CMS Choice CMS Medicare.gov Compare Post Acute Care list provided to:: Patient Represenative (must comment) Choice offered to / list presented to : Adult Children  Discharge Placement                Patient [facility] bed at: [facility] Patient to be transferred to facility by: Family Name of family member notified: Daughter [name] Patient and family notified of of transfer: 02/06/23  Discharge Plan and Services Additional resources added to the After Visit Summary for   In-house Referral: Clinical Social Work Discharge Planning Services: NA Post Acute Care Choice: Resumption of Svcs/PTA Provider          DME Arranged: N/A DME Agency: NA                  Social Determinants of Health ([place]) Interventions SDOH Screenings   Tobacco Use: Medium Risk (01/26/2023)     Readmission Risk Interventions    01/26/2023    3:00 PM 01/26/2023   11:35 AM  Readmission Risk Prevention Plan  Transportation Screening Complete Complete  PCP or Specialist Appt within 5-7 Days Complete Complete  Home Care Screening  Complete  Medication Review (RN CM)  Complete

## 2023-02-06 NOTE — Care Management Important Message (Signed)
Important Message  Patient Details IM Letter given. Name: [name] MRN: [id number] Date of Birth: [birth year]   Medicare Important Message Given:  Yes     Kerin Salen 02/06/2023, 11:56 AM

## 2023-02-06 NOTE — Progress Notes (Signed)
This is a late entry for 3/17  Patient has been durably stable and has had no significant changes in status since 3/16 Did review her vitals-she has not had recent labs but the trend of her creatinine has gone down over the past several days and she should force fluids at the facility She was able to stand today I examined her at the bedside with nursing-she has a small stage I in her lower back Her chest is clear she is breathing well She remains slightly confused I feel that she is stable for discharge and if no fevers overnight and no further untoward event we will plan for discharge to skilled facility on 3/18  Verneita Griffes, MD [facility] 9:04 AM   No charge

## 2023-02-06 NOTE — Discharge Summary (Signed)
Physician Discharge Summary  [name] [id number] DOB: 1932-07-13 DOA: 01/25/2023  PCP: Mayra Neer, MD  Admit date: 01/25/2023 Discharge date: 02/06/2023  Time spent: 37 minutes  Recommendations for Outpatient Follow-up:  Please force oral fluids at skilled facility at least 1.5 L over 24 hours and please get labs in 1 week including basic chemistries as she developed some azotemia during hospitalization Gently redirect, and should be good candidate for physical therapy at rehab facility Note that this admission was started on low-dose linagliptin secondary to steroids and this may be discontinued in the next week or so depending on her blood sugars  Discharge Diagnoses:  MAIN problem for hospitalization   COVID-19 pneumonia relatively asymptomatic Permanent A-fib CHADVASC >3, has bled score >3 DM TY 2 Moderate dementia AKI during hospitalization resolved  Please see below for itemized issues addressed in [place]- refer to other progress notes for clarity if needed  Discharge Condition: Improved  Diet recommendation: Regular-would liberalize diet given her advanced age to allow all types of foods-  Filed Weights   02/04/23 0531 02/05/23 0435 02/06/23 0423  Weight: 77.2 kg 76.8 kg 75 kg    History of present illness:  89 year old female  DM TY 2 Paroxy A-fib/Eliquis--?  Wide-complex tachycardia in the past HFpEF HTN Prior falls 07/2022 with metaphyseal fractures left humeral fracture scalp hematoma Closed right hip fracture status post cephalomedullary nailing 09/30/2021   Admit on 3/7 with 3 days of cough mild fever COVID-positive PCR 3/6 Rx 3 doses remdesivir, steroids started 3/7   Patient has remained durably stable over the past several days while awaiting skilled facility to accept her to their facility outside of the 10-day COVID window as per old CDC guidelines  Hospital Course:  COVID-19 pneumonia, relatively asymptomatic currently [  required oxygen  earlier during hospitalization] - Completed remdesivir 3 doses as well as steroids and will no longer require any further treatment - asymptomatic and because since were discontinued and patient was stable   Paroxysmal A-fib on Eliquis, CHADVASC >4, has bled >3 Paroxysmal wide-complex tachycardia in the past HFpEF last echo  EF 60-65% 07/2021 - Continue metoprolol 12.5 twice daily - Did discontinue all ACE/ARB secondary to mild azotemia and see below - continue [lower dosing] Eliquis 2.5 twice daily given history of falls in 2023 as well as hip fracture in 2022 - Do not increase Eliquis dose, family understand and accept small per year risk of CVA > large risk of fall and bleeding    DM TY 2 A1c 5.7 this admission - CBGs ranging 90-150 eating 80% - Continue linagliptin 5 daily--- does need continued CBG checks   Mild to moderate dementia - Continue Namenda 10 twice daily, sertraline 100 at bedtime - melatonin while in hospital   AKI superimposed on CKD 3 AA - Normal saline started on 3/14 - Saline lock subsequently - At facility needs 1.5 L every 24 hours mandatory until next bmet which should be in about a week   Discharge Exam: Vitals:   02/05/23 2141 02/06/23 0423  BP: (!) 107/58 115/61  Pulse: 67 61  Resp:  18  Temp:  97.9 F (36.6 C)  SpO2:  95%    Subj on day of d/c   Swelling no distress seems comfortable having a cup of coffee this morning  General Exam on discharge  EOMI NCAT no focal deficit No icterus no rales no rhonchi no wheeze Chest is clear no added sound Abdomen soft no rebound no guarding ROM  is intact power is 5/5  Discharge Instructions   Discharge Instructions     Diet - low sodium heart healthy   Complete by: As directed    Increase activity slowly   Complete by: As directed       Allergies as of 02/06/2023       Reactions   Sulfa Antibiotics Hives, Diarrhea, Nausea And Vomiting, Swelling   Facial swelling ALSO   Sulfasalazine  Hives, Swelling   Other Reaction(s): Diarrhea, GI Intolerance Facial swelling ALSO   Latex Rash   NOT ON MAR        Medication List     STOP taking these medications    Aspercreme Lidocaine 4 % Crea Generic drug: Lidocaine HCl   Biofreeze Professional 5 % Gel Generic drug: Menthol (Topical Analgesic)   loperamide 2 MG capsule Commonly known as: IMODIUM   losartan 25 MG tablet Commonly known as: COZAAR   polyethylene glycol 17 g packet Commonly known as: MIRALAX / GLYCOLAX   senna-docusate 8.6-50 MG tablet Commonly known as: Senokot-S   TUMS CHEWY BITES PO   VITAMIN D2 PO       TAKE these medications    acetaminophen 325 MG tablet Commonly known as: TYLENOL Take 650 mg by mouth every 6 (six) hours as needed for mild pain, fever or headache.   apixaban 2.5 MG Tabs tablet Commonly known as: ELIQUIS Take 1 tablet (2.5 mg total) by mouth 2 (two) times daily.   benzonatate 200 MG capsule Commonly known as: TESSALON Take 1 capsule (200 mg total) by mouth 3 (three) times daily.   cyanocobalamin 1000 MCG tablet Take 1,000 mcg by mouth daily.   feeding supplement Liqd Take 237 mLs by mouth 2 (two) times daily between meals.   ferrous gluconate 324 MG tablet Commonly known as: FERGON Take 324 mg by mouth daily with breakfast.   linagliptin 5 MG Tabs tablet Commonly known as: TRADJENTA Take 1 tablet (5 mg total) by mouth daily.   melatonin 3 MG Tabs tablet Take 3 mg by mouth at bedtime.   memantine 10 MG tablet Commonly known as: NAMENDA Take 1 tablet (10 mg total) by mouth 2 (two) times daily.   metoprolol tartrate 25 MG tablet Commonly known as: LOPRESSOR Take 0.5 tablets (12.5 mg total) by mouth 2 (two) times daily.   sertraline 100 MG tablet Commonly known as: ZOLOFT Take 1 tablet (100 mg total) by mouth at bedtime.   simvastatin 20 MG tablet Commonly known as: ZOCOR Take 1 tablet (20 mg total) by mouth daily at 6 PM.       Allergies   Allergen Reactions   Sulfa Antibiotics Hives, Diarrhea, Nausea And Vomiting and Swelling    Facial swelling ALSO   Sulfasalazine Hives and Swelling    Other Reaction(s): Diarrhea, GI Intolerance  Facial swelling ALSO   Latex Rash    NOT ON MAR      The results of significant diagnostics from this hospitalization (including imaging, microbiology, ancillary and laboratory) are listed below for reference.    Significant Diagnostic Studies: DG Chest 2 View  Result Date: 01/25/2023 CLINICAL DATA:  Weakness, hypoxia. EXAM: CHEST - 2 VIEW COMPARISON:  Radiograph 08/06/2022, CT 08/09/2021 FINDINGS: The heart is normal in size. There is chronic aortic tortuosity. No focal airspace disease, pleural effusion, pulmonary edema or pneumothorax. Lateral view is limited due to overlying artifacts. Remote right proximal humerus fracture. IMPRESSION: No acute abnormality. Electronically Signed   By: Aurther Loft.D.  On: 01/25/2023 23:43   CT Head Wo Contrast  Result Date: 01/25/2023 CLINICAL DATA:  Altered mental status, weakness EXAM: CT HEAD WITHOUT CONTRAST TECHNIQUE: Contiguous axial images were obtained from the base of the skull through the vertex without intravenous contrast. RADIATION DOSE REDUCTION: This exam was performed according to the departmental dose-optimization program which includes automated exposure control, adjustment of the mA and/or kV according to patient size and/or use of iterative reconstruction technique. COMPARISON:  08/06/2022 FINDINGS: Brain: No evidence of acute infarction, hemorrhage, hydrocephalus, extra-axial collection or mass lesion/mass effect. Global cortical and central atrophy. Secondary ventricular prominence. Subcortical white matter and periventricular small vessel ischemic changes. 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 Vascular: Intracranial atherosclerosis. Skull: Normal. Negative for fracture or focal lesion. Sinuses/Orbits: Partial opacification of the bilateral  ethmoid and right maxillary sinuses. Mastoid air cells are clear. Other: None. IMPRESSION: No acute intracranial abnormality. Atrophy with small vessel ischemic changes. 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 Electronically Signed   By: Julian Hy M.D.   On: 01/25/2023 23:31    Microbiology: No results found for this or any previous visit (from the past 240 hour(s)).   Labs: Basic Metabolic Panel: Recent Labs  Lab 02/01/23 0508 02/02/23 0531 02/03/23 0636 02/04/23 0657  NA 136 138 139 136  K 4.5 4.5 4.3 4.3  CL 99 103 103 101  CO2 25 26 26 28   GLUCOSE 91 109* 93 87  BUN 40* 34* 30* 27*  CREATININE 1.17* 1.19* 1.05* 1.05*  CALCIUM 8.7* 8.4* 8.7* 8.5*   Liver Function Tests: Recent Labs  Lab 02/04/23 0657  AST 15  ALT 19  ALKPHOS 80  BILITOT 0.6  PROT 5.9*  ALBUMIN 3.1*   No results for input(s): "LIPASE", "AMYLASE" in the last 168 hours. No results for input(s): "AMMONIA" in the last 168 hours. CBC: Recent Labs  Lab 02/01/23 0508 02/04/23 0657  WBC 14.6* 11.4*  NEUTROABS  --  7.1  HGB 13.5 12.7  HCT 42.0 39.7  MCV 88.2 90.2  PLT 282 265   Cardiac Enzymes: No results for input(s): "CKTOTAL", "CKMB", "CKMBINDEX", "TROPONINI" in the last 168 hours. BNP: BNP (last 3 results) Recent Labs    01/26/23 0554  BNP 69.4    ProBNP (last 3 results) No results for input(s): "PROBNP" in the last 8760 hours.  CBG: Recent Labs  Lab 02/05/23 0749 02/05/23 1217 02/05/23 1724 02/05/23 2032 02/06/23 0810  GLUCAP 86 127* 150* 148* 91       Signed:  Nita Sells MD   [facility] 02/06/2023, 9:05 AM

## 2023-02-06 NOTE — Discharge Planning (Signed)
Report called into [facility] given to [facility] Nurse.  All questions answered.  Told nurse that AVS will be given to daughter to bring with last doses of meds written in.  All belongings given to pt's daughter.  AVS instructions given to daughter.  All questions answered.  Pt stable at time of discharge.

## 2023-04-02 ENCOUNTER — Emergency Department (HOSPITAL_COMMUNITY): Payer: Medicare Other

## 2023-04-02 ENCOUNTER — Emergency Department (HOSPITAL_COMMUNITY)
Admission: EM | Admit: 2023-04-02 | Discharge: 2023-04-02 | Disposition: A | Discharge status: Home / Self Care | Payer: Medicare Other | Attending: Emergency Medicine | Admitting: Emergency Medicine

## 2023-04-02 ENCOUNTER — Encounter (HOSPITAL_COMMUNITY): Payer: Self-pay

## 2023-04-02 DIAGNOSIS — G309 Alzheimer's disease, unspecified: Secondary | ICD-10-CM | POA: Diagnosis not present

## 2023-04-02 DIAGNOSIS — W19XXXA Unspecified fall, initial encounter: Secondary | ICD-10-CM | POA: Diagnosis not present

## 2023-04-02 DIAGNOSIS — S0003XA Contusion of scalp, initial encounter: Secondary | ICD-10-CM | POA: Insufficient documentation

## 2023-04-02 DIAGNOSIS — F0280 Dementia in other diseases classified elsewhere without behavioral disturbance: Secondary | ICD-10-CM | POA: Insufficient documentation

## 2023-04-02 DIAGNOSIS — E1122 Type 2 diabetes mellitus with diabetic chronic kidney disease: Secondary | ICD-10-CM | POA: Insufficient documentation

## 2023-04-02 DIAGNOSIS — Z91040 Latex allergy status: Secondary | ICD-10-CM | POA: Insufficient documentation

## 2023-04-02 DIAGNOSIS — N183 Chronic kidney disease, stage 3 unspecified: Secondary | ICD-10-CM | POA: Insufficient documentation

## 2023-04-02 DIAGNOSIS — I130 Hypertensive heart and chronic kidney disease with heart failure and stage 1 through stage 4 chronic kidney disease, or unspecified chronic kidney disease: Secondary | ICD-10-CM | POA: Insufficient documentation

## 2023-04-02 DIAGNOSIS — M79661 Pain in right lower leg: Secondary | ICD-10-CM | POA: Insufficient documentation

## 2023-04-02 DIAGNOSIS — M25551 Pain in right hip: Secondary | ICD-10-CM | POA: Diagnosis not present

## 2023-04-02 DIAGNOSIS — S0990XA Unspecified injury of head, initial encounter: Secondary | ICD-10-CM | POA: Diagnosis present

## 2023-04-02 DIAGNOSIS — I509 Heart failure, unspecified: Secondary | ICD-10-CM | POA: Insufficient documentation

## 2023-04-02 DIAGNOSIS — Z7901 Long term (current) use of anticoagulants: Secondary | ICD-10-CM | POA: Insufficient documentation

## 2023-04-02 LAB — BASIC METABOLIC PANEL
Anion gap: 9 (ref 5–15)
BUN: 19 mg/dL (ref 8–23)
CO2: 25 mmol/L (ref 22–32)
Calcium: 9 mg/dL (ref 8.9–10.3)
Chloride: 104 mmol/L (ref 98–111)
Creatinine, Ser: 1.05 mg/dL — ABNORMAL HIGH (ref 0.44–1.00)
GFR, Estimated: 51 mL/min — ABNORMAL LOW (ref >60)
Glucose, Bld: 103 mg/dL — ABNORMAL HIGH (ref 70–99)
Potassium: 4.1 mmol/L (ref 3.5–5.1)
Sodium: 138 mmol/L (ref 135–145)

## 2023-04-02 LAB — PROTIME-INR
INR: 1.2 (ref 0.8–1.2)
Prothrombin Time: 15.1 seconds (ref 11.4–15.2)

## 2023-04-02 LAB — CBC WITH DIFFERENTIAL/PLATELET
Abs Immature Granulocytes: 0.02 10*3/uL (ref 0.00–0.07)
Basophils Absolute: 0.1 10*3/uL (ref 0.0–0.1)
Basophils Relative: 1 %
Eosinophils Absolute: 0.4 10*3/uL (ref 0.0–0.5)
Eosinophils Relative: 5 %
HCT: 40.2 % (ref 36.0–46.0)
Hemoglobin: 12.3 g/dL (ref 12.0–15.0)
Immature Granulocytes: 0 %
Lymphocytes Relative: 31 %
Lymphs Abs: 2.4 10*3/uL (ref 0.7–4.0)
MCH: 28.9 pg (ref 26.0–34.0)
MCHC: 30.6 g/dL (ref 30.0–36.0)
MCV: 94.6 fL (ref 80.0–100.0)
Monocytes Absolute: 0.7 10*3/uL (ref 0.1–1.0)
Monocytes Relative: 9 %
Neutro Abs: 4.3 10*3/uL (ref 1.7–7.7)
Neutrophils Relative %: 54 %
Platelets: 250 10*3/uL (ref 150–400)
RBC: 4.25 MIL/uL (ref 3.87–5.11)
RDW: 13.6 % (ref 11.5–15.5)
WBC: 7.8 10*3/uL (ref 4.0–10.5)
nRBC: 0 % (ref 0.0–0.2)

## 2023-04-02 NOTE — ED Provider Notes (Signed)
[place] EMERGENCY DEPARTMENT AT [facility] Provider Note  CSN: [id number] Arrival date & time: 04/02/23 4098  Chief Complaint(s) Level 2 Fall ED Triage Notes Maple Hudson, RN (Registered Nurse)  Emergency Medicine  Date of Service: 04/02/2023  2:22 AM  Signed Pt from [facility] BIB [facility] for unwitnessed fall, pt was walking and fell next to bookself, found on her right side, pt c/o right hip pain (no deformity), pt hit her head, hematoma to right side of head, no laceration or bleeding noted. Pt is on Eliquis. VSS per EMS, A&O x3 at baseline, pt has dementia.      HPI [name] is a 89 y.o. female with a past medical history listed below including Alzheimer's dementia here from nursing facility after an unwitnessed fall.  Patient was found roaming the halls earlier this morning and then found down on the ground on her right side.  Noted to have right parietal/occipital scalp hematoma.  Patient was complaining of right hip pain.  The history is provided by the EMS personnel.    Past Medical History Past Medical History:  Diagnosis Date   Allergies    Alzheimer disease (HCC)    STABLE   Anemia, iron deficiency 12/12/2011   Arthritis    RF   CKD (chronic kidney disease)    STAGE 3   Diabetes mellitus    no mes, diet only    Diarrhea    RESOLVED   Hypercholesterolemia    Hyperlipemia    Hypertension    Major depression    OA (osteoarthritis)    HAND/LEFT KNEE PAIN MILD   Osteoporosis    Stroke [facility])    Syncope    Patient Active Problem List   Diagnosis Date Noted   COVID-19 virus infection 01/26/2023   Acute respiratory failure with hypoxia (HCC) 01/26/2023   Generalized weakness 01/26/2023   Chronic diastolic CHF (congestive heart failure) (HCC) 01/26/2023   Acute cystitis 08/07/2022   Acute cystitis without hematuria 08/06/2022   Scalp hematoma 08/06/2022   Fall 08/06/2022   Distal radial fracture 08/06/2022   Hypertensive urgency  08/06/2022   Paroxysmal atrial fibrillation (HCC) 08/06/2022   Chronic anticoagulation 08/06/2022   Acute on chronic renal insufficiency 08/06/2022   (HFpEF) heart failure with preserved ejection fraction (HCC) 08/06/2022   Closed right hip fracture, initial encounter (HCC) 09/26/2021   Head trauma 09/26/2021   Hypocalcemia 09/26/2021   Hyperglycemia 09/26/2021   Acute metabolic encephalopathy 08/10/2021   COVID-19 08/09/2021   History of syncope 08/13/2019   Syncope and collapse 01/23/2019   Leukocytosis 01/23/2019   Diarrhea 01/23/2019   Prolonged QT interval 01/23/2019   Acute confusional state 06/19/2015   Memory loss 06/19/2015   DM2 (diabetes mellitus, type 2) (HCC) 05/14/2015   Hyperlipidemia 05/14/2015   Essential hypertension    TIA (transient ischemic attack) 05/13/2015   Anemia, iron deficiency 12/12/2011   Fracture of proximal end of left humerus 12/11/2011   Hypoxia 12/11/2011   Wide-complex tachycardia 12/11/2011   Acute hypotension 12/11/2011   Home Medication(s) Prior to Admission medications   Medication Sig Start Date End Date Taking? Authorizing Provider  acetaminophen (TYLENOL) 325 MG tablet Take 650 mg by mouth every 6 (six) hours as needed for mild pain, fever or headache.    [provider]  apixaban (ELIQUIS) 2.5 MG TABS tablet Take 1 tablet (2.5 mg total) by mouth 2 (two) times daily. Patient not taking: Reported on 01/27/2023 08/16/21   Natale Milch,  Kristine Garbe, MD  benzonatate (TESSALON) 200 MG capsule Take 1 capsule (200 mg total) by mouth 3 (three) times daily. 02/06/23   Rhetta Mura, MD  cyanocobalamin 1000 MCG tablet Take 1,000 mcg by mouth daily.    [provider]  feeding supplement (ENSURE ENLIVE / ENSURE PLUS) LIQD Take 237 mLs by mouth 2 (two) times daily between meals. 09/30/21   Lorin Glass, MD  ferrous gluconate (FERGON) 324 MG tablet Take 324 mg by mouth daily with breakfast. Patient not taking: Reported on 01/27/2023     [provider]  linagliptin (TRADJENTA) 5 MG TABS tablet Take 1 tablet (5 mg total) by mouth daily. 02/06/23   Rhetta Mura, MD  Melatonin 3 MG TABS Take 3 mg by mouth at bedtime.    [provider]  memantine (NAMENDA) 10 MG tablet Take 1 tablet (10 mg total) by mouth 2 (two) times daily. 02/06/23   Rhetta Mura, MD  metoprolol tartrate (LOPRESSOR) 25 MG tablet Take 0.5 tablets (12.5 mg total) by mouth 2 (two) times daily. 08/16/21   Azucena Fallen, MD  sertraline (ZOLOFT) 100 MG tablet Take 1 tablet (100 mg total) by mouth at bedtime. 02/06/23   Rhetta Mura, MD  simvastatin (ZOCOR) 20 MG tablet Take 1 tablet (20 mg total) by mouth daily at 6 PM. 08/19/21   Azucena Fallen, MD                                                                                                                                    Allergies Sulfa antibiotics, Sulfasalazine, and Latex  Review of Systems Review of Systems As noted in HPI  Physical Exam Vital Signs  I have reviewed the triage vital signs BP (!) 106/42   Pulse 65   Temp 98.6 F (37 C) (Oral)   Resp 16   Ht 5\' 2"  (1.575 m)   Wt 68 kg   SpO2 95%   BMI 27.44 kg/m   Physical Exam Constitutional:      General: She is not in acute distress.    Appearance: She is well-developed. She is not diaphoretic.  HENT:     Head: Normocephalic. Contusion present.      Right Ear: External ear normal.     Left Ear: External ear normal.     Nose: Nose normal.  Eyes:     General: No scleral icterus.       Right eye: No discharge.        Left eye: No discharge.     Conjunctiva/sclera: Conjunctivae normal.     Pupils: Pupils are equal, round, and reactive to light.  Cardiovascular:     Rate and Rhythm: Normal rate and regular rhythm.     Pulses:          Radial pulses are 2+ on the right side and 2+ on the left side.       Dorsalis  pedis pulses are 2+ on the right side and 2+ on the left side.      Heart sounds: Normal heart sounds. No murmur heard.    No friction rub. No gallop.  Pulmonary:     Effort: Pulmonary effort is normal. No respiratory distress.     Breath sounds: Normal breath sounds. No stridor. No wheezing.  Abdominal:     General: There is no distension.     Palpations: Abdomen is soft.     Tenderness: There is no abdominal tenderness.  Musculoskeletal:     Cervical back: Normal range of motion and neck supple. No bony tenderness.     Thoracic back: No bony tenderness.     Lumbar back: No bony tenderness.     Right hip: Tenderness (mild) present.     Left hip: No tenderness.     Right lower leg: Tenderness present. 1+ Pitting Edema present.     Left lower leg: 1+ Pitting Edema present.       Legs:     Comments: Clavicles stable. Chest stable to AP/Lat compression. Pelvis stable to Lat compression. No obvious extremity deformity. No chest or abdominal wall contusion.  Skin:    General: Skin is warm and dry.     Findings: No erythema or rash.  Neurological:     Mental Status: She is alert and oriented to person, place, and time.     Comments: Moving all extremities     ED Results and Treatments Labs (all labs ordered are listed, but only abnormal results are displayed) Labs Reviewed  BASIC METABOLIC PANEL - Abnormal; Notable for the following components:      Result Value   Glucose, Bld 103 (*)    Creatinine, Ser 1.05 (*)    GFR, Estimated 51 (*)    All other components within normal limits  CBC WITH DIFFERENTIAL/PLATELET  PROTIME-INR                                                                                                                         EKG  EKG Interpretation  Date/Time:  Sunday Apr 02 2023 02:29:58 EDT Ventricular Rate:  61 PR Interval:  171 QRS Duration: 98 QT Interval:  432 QTC Calculation: 436 R Axis:   39 Text Interpretation: Sinus rhythm Low voltage, precordial leads Confirmed by Drema Pry [phone]) on 04/02/2023  6:22:33 AM       Radiology DG Ankle Complete Right  Result Date: 04/02/2023 CLINICAL DATA:  Status post fall. EXAM: RIGHT ANKLE - COMPLETE 3+ VIEW COMPARISON:  None Available. FINDINGS: There is no evidence of an acute fracture, dislocation, or joint effusion. Chronic and degenerative changes are seen involving the right ankle on the lateral view. Mild to moderate severity diffuse soft tissue swelling is seen. IMPRESSION: Mild to moderate severity diffuse soft tissue swelling without evidence of acute fracture or dislocation. Electronically Signed   By: Aram Candela M.D.   On: 04/02/2023 03:43   DG HIP  UNILAT W OR W/O PELVIS 2-3 VIEWS RIGHT  Result Date: 04/02/2023 CLINICAL DATA:  Status post fall. EXAM: DG HIP (WITH OR WITHOUT PELVIS) 2-3V RIGHT COMPARISON:  February 08, 2022 FINDINGS: There is no evidence of an acute hip fracture or dislocation. Chronic fractures are seen involving the right superior and right inferior pubic rami. A radiopaque intramedullary rod and compression screw device are seen within the proximal right femur. There is no evidence of significant arthropathy or other focal bone abnormality. IMPRESSION: 1. No acute fracture or dislocation. 2. Chronic fractures of the right superior and right inferior pubic rami. Electronically Signed   By: Aram Candela M.D.   On: 04/02/2023 03:41   CT Head Wo Contrast  Result Date: 04/02/2023 CLINICAL DATA:  Head trauma, intracranial venous injury suspected; Neck trauma (Age >= 65y) EXAM: CT HEAD WITHOUT CONTRAST CT CERVICAL SPINE WITHOUT CONTRAST TECHNIQUE: Multidetector CT imaging of the head and cervical spine was performed following the standard protocol without intravenous contrast. Multiplanar CT image reconstructions of the cervical spine were also generated. RADIATION DOSE REDUCTION: This exam was performed according to the departmental dose-optimization program which includes automated exposure control, adjustment of the mA  and/or kV according to patient size and/or use of iterative reconstruction technique. COMPARISON:  CT head and C-spine 08/06/2022 FINDINGS: CT HEAD FINDINGS Brain: Cerebral ventricle sizes are concordant with the degree of cerebral volume loss. Patchy and confluent areas of decreased attenuation are noted throughout the deep and periventricular white matter of the cerebral hemispheres bilaterally, compatible with chronic microvascular ischemic disease. No evidence of large-territorial acute infarction. No parenchymal hemorrhage. No mass lesion. No extra-axial collection. No mass effect or midline shift. No hydrocephalus. Basilar cisterns are patent. Vascular: No hyperdense vessel. Skull: No acute fracture or focal lesion. Persistent Periapical lucency surrounding a right maxillary molar (4:7). Sinuses/Orbits: Right maxillary sinus mucosal thickening. Paranasal sinuses and mastoid air cells are clear. The orbits are unremarkable. Other: None. CT CERVICAL SPINE FINDINGS Alignment: Normal. Skull base and vertebrae: Multilevel at least moderate degenerative change of the spine. Associated severe osseous neural foraminal stenosis of the bilateral C5-C6 and C6-C7 levels. No acute fracture. No aggressive appearing focal osseous lesion or focal pathologic process. Soft tissues and spinal canal: No prevertebral fluid or swelling. No visible canal hematoma. Upper chest: Unremarkable. Other: None. IMPRESSION: 1. No acute intracranial abnormality. 2. No acute displaced fracture or traumatic listhesis of the cervical spine. 3. Multilevel at least moderate degenerative change of the spine. Associated severe osseous neural foraminal stenosis of the bilateral C5-C6 and C6-C7 levels. 4. Persistent Periapical lucency surrounding a right maxillary molar. Correlate for loosening of the setting of trauma versus infection. Electronically Signed   By: Tish Frederickson M.D.   On: 04/02/2023 03:04   CT Cervical Spine Wo Contrast  Result  Date: 04/02/2023 CLINICAL DATA:  Head trauma, intracranial venous injury suspected; Neck trauma (Age >= 65y) EXAM: CT HEAD WITHOUT CONTRAST CT CERVICAL SPINE WITHOUT CONTRAST TECHNIQUE: Multidetector CT imaging of the head and cervical spine was performed following the standard protocol without intravenous contrast. Multiplanar CT image reconstructions of the cervical spine were also generated. RADIATION DOSE REDUCTION: This exam was performed according to the departmental dose-optimization program which includes automated exposure control, adjustment of the mA and/or kV according to patient size and/or use of iterative reconstruction technique. COMPARISON:  CT head and C-spine 08/06/2022 FINDINGS: CT HEAD FINDINGS Brain: Cerebral ventricle sizes are concordant with the degree of cerebral volume loss. Patchy and confluent areas  of decreased attenuation are noted throughout the deep and periventricular white matter of the cerebral hemispheres bilaterally, compatible with chronic microvascular ischemic disease. No evidence of large-territorial acute infarction. No parenchymal hemorrhage. No mass lesion. No extra-axial collection. No mass effect or midline shift. No hydrocephalus. Basilar cisterns are patent. Vascular: No hyperdense vessel. Skull: No acute fracture or focal lesion. Persistent Periapical lucency surrounding a right maxillary molar (4:7). Sinuses/Orbits: Right maxillary sinus mucosal thickening. Paranasal sinuses and mastoid air cells are clear. The orbits are unremarkable. Other: None. CT CERVICAL SPINE FINDINGS Alignment: Normal. Skull base and vertebrae: Multilevel at least moderate degenerative change of the spine. Associated severe osseous neural foraminal stenosis of the bilateral C5-C6 and C6-C7 levels. No acute fracture. No aggressive appearing focal osseous lesion or focal pathologic process. Soft tissues and spinal canal: No prevertebral fluid or swelling. No visible canal hematoma. Upper  chest: Unremarkable. Other: None. IMPRESSION: 1. No acute intracranial abnormality. 2. No acute displaced fracture or traumatic listhesis of the cervical spine. 3. Multilevel at least moderate degenerative change of the spine. Associated severe osseous neural foraminal stenosis of the bilateral C5-C6 and C6-C7 levels. 4. Persistent Periapical lucency surrounding a right maxillary molar. Correlate for loosening of the setting of trauma versus infection. Electronically Signed   By: Tish Frederickson M.D.   On: 04/02/2023 03:04    Medications Ordered in ED Medications - No data to display                                                                                                                                   Procedures Procedures  (including critical care time)  Medical Decision Making / ED Course  Click here for ABCD2, HEART and other calculators  Medical Decision Making Amount and/or Complexity of Data Reviewed Labs: ordered. Decision-making details documented in ED Course. Radiology: ordered and independent interpretation performed. Decision-making details documented in ED Course. ECG/medicine tests: ordered and independent interpretation performed. Decision-making details documented in ED Course.   Unwitnessed fall at skilled nursing facility Level 2 trauma due to anticoagulation ABCs intact Secondary as above  CBC without leukocytosis or anemia Metabolic panel without significant electrolyte derangements or renal sufficiency CT head and cervical spine negative for acute injury Plain film of the right hip notable for  old hip fracture.  No acute fracture noted. Right distal tib-fib negative for any acute fractures.        Final Clinical Impression(s) / ED Diagnoses Final diagnoses:  Fall at nursing home, initial encounter   The patient appears reasonably screened and/or stabilized for discharge and I doubt any other medical condition or other [facility] requiring further  screening, evaluation, or treatment in the ED at this time.   Disposition: Discharge  Condition: Good  ED Discharge Orders     None         Follow Up: Lupita Raider, MD [address] [place] [address]  [phone]  Call  to schedule an appointment for close follow up           This chart was dictated using voice recognition software.  Despite best efforts to proofread,  errors can occur which can change the documentation meaning.    Nira Conn, MD 04/02/23 [phone]

## 2023-04-02 NOTE — ED Triage Notes (Signed)
Pt from [facility] BIB [facility] for unwitnessed fall, pt was walking and fell next to bookself, found on her right side, pt c/o right hip pain (no deformity), pt hit her head, hematoma to right side of head, no laceration or bleeding noted. Pt is on Eliquis. VSS per EMS, A&O x3 at baseline, pt has dementia.

## 2023-04-02 NOTE — ED Notes (Signed)
Pt was changed from her soaking wet brief into a clean and dry diaper, noted skin breakdown and yeast in there abd fold to her right groin area. Incontinent skin barrier applied

## 2023-04-02 NOTE — ED Notes (Signed)
Pt return from Ct scanner, NAD noted, A&O x3 at baseline

## 2023-04-02 NOTE — ED Notes (Signed)
Pt to CT scanner at this time with this RN

## 2023-04-02 NOTE — Progress Notes (Signed)
   04/02/23 0242  Spiritual Encounters  Type of Visit Initial  Care provided to: Patient  Referral source Trauma page  Reason for visit Trauma  OnCall Visit Yes  Interventions  Spiritual Care Interventions Made Compassionate presence;Reflective listening;Normalization of emotions;Established relationship of care and support  Intervention Outcomes  Outcomes Connection to spiritual care;Awareness of support;Reduced anxiety  Spiritual Care Plan  Spiritual Care Issues Still Outstanding No further spiritual care needs at this time (see row info)   Patient was appreciative of chaplain's presence. She is aware of support and how to request further spiritual care if the need should they arise. Patient declined further chaplain services.   Arlyce Dice, Chaplain Resident [phone]

## 2023-04-02 NOTE — ED Notes (Signed)
[facility] called for transport back to facility, pt to remain in treatment area and not placed in [facility] lobby

## 2023-04-02 NOTE — ED Notes (Signed)
FALL SAFETY: Pt has yellow fall bracelet on left wrist Non-slip socks on bilateral feet Bed alarm on and active Bed in lowest position & side rail up x2 Yellow fall magnet on door frame Door to RM open, staff can observe pt from nurses station Intermittent safety rounding in place.  Pt educated on fall safety plan, verbalized understanding

## 2023-04-02 NOTE — ED Notes (Signed)
[facility] has arrived to transport pt back to facility  

## 2023-12-26 ENCOUNTER — Encounter (HOSPITAL_COMMUNITY): Payer: Self-pay

## 2023-12-26 ENCOUNTER — Emergency Department (HOSPITAL_COMMUNITY): Payer: Medicare Other

## 2023-12-26 ENCOUNTER — Other Ambulatory Visit: Payer: Self-pay

## 2023-12-26 ENCOUNTER — Inpatient Hospital Stay (HOSPITAL_COMMUNITY)
Admission: EM | Admit: 2023-12-26 | Discharge: 2023-12-28 | DRG: 683 | Disposition: A | Discharge status: Nursing Home (Includes ICF) | Payer: Medicare Other | Source: Skilled Nursing Facility | Attending: Internal Medicine | Admitting: Internal Medicine

## 2023-12-26 DIAGNOSIS — E7800 Pure hypercholesterolemia, unspecified: Secondary | ICD-10-CM | POA: Diagnosis present

## 2023-12-26 DIAGNOSIS — N390 Urinary tract infection, site not specified: Secondary | ICD-10-CM | POA: Diagnosis present

## 2023-12-26 DIAGNOSIS — Z882 Allergy status to sulfonamides status: Secondary | ICD-10-CM

## 2023-12-26 DIAGNOSIS — Z7984 Long term (current) use of oral hypoglycemic drugs: Secondary | ICD-10-CM | POA: Diagnosis not present

## 2023-12-26 DIAGNOSIS — Z8673 Personal history of transient ischemic attack (TIA), and cerebral infarction without residual deficits: Secondary | ICD-10-CM | POA: Diagnosis not present

## 2023-12-26 DIAGNOSIS — I130 Hypertensive heart and chronic kidney disease with heart failure and stage 1 through stage 4 chronic kidney disease, or unspecified chronic kidney disease: Secondary | ICD-10-CM | POA: Diagnosis present

## 2023-12-26 DIAGNOSIS — Z66 Do not resuscitate: Secondary | ICD-10-CM | POA: Diagnosis present

## 2023-12-26 DIAGNOSIS — M810 Age-related osteoporosis without current pathological fracture: Secondary | ICD-10-CM | POA: Diagnosis present

## 2023-12-26 DIAGNOSIS — N179 Acute kidney failure, unspecified: Principal | ICD-10-CM

## 2023-12-26 DIAGNOSIS — N1831 Chronic kidney disease, stage 3a: Secondary | ICD-10-CM | POA: Diagnosis present

## 2023-12-26 DIAGNOSIS — I480 Paroxysmal atrial fibrillation: Secondary | ICD-10-CM | POA: Diagnosis present

## 2023-12-26 DIAGNOSIS — E119 Type 2 diabetes mellitus without complications: Secondary | ICD-10-CM | POA: Diagnosis present

## 2023-12-26 DIAGNOSIS — F0390 Unspecified dementia without behavioral disturbance: Secondary | ICD-10-CM | POA: Diagnosis present

## 2023-12-26 DIAGNOSIS — Z8249 Family history of ischemic heart disease and other diseases of the circulatory system: Secondary | ICD-10-CM | POA: Diagnosis not present

## 2023-12-26 DIAGNOSIS — Z87891 Personal history of nicotine dependence: Secondary | ICD-10-CM | POA: Diagnosis not present

## 2023-12-26 DIAGNOSIS — Z1152 Encounter for screening for COVID-19: Secondary | ICD-10-CM

## 2023-12-26 DIAGNOSIS — Z79899 Other long term (current) drug therapy: Secondary | ICD-10-CM | POA: Diagnosis not present

## 2023-12-26 DIAGNOSIS — E860 Dehydration: Secondary | ICD-10-CM | POA: Diagnosis present

## 2023-12-26 DIAGNOSIS — R55 Syncope and collapse: Secondary | ICD-10-CM | POA: Diagnosis present

## 2023-12-26 DIAGNOSIS — Z7901 Long term (current) use of anticoagulants: Secondary | ICD-10-CM | POA: Diagnosis not present

## 2023-12-26 DIAGNOSIS — Z91040 Latex allergy status: Secondary | ICD-10-CM

## 2023-12-26 DIAGNOSIS — I5032 Chronic diastolic (congestive) heart failure: Secondary | ICD-10-CM | POA: Diagnosis present

## 2023-12-26 DIAGNOSIS — T501X5A Adverse effect of loop [high-ceiling] diuretics, initial encounter: Secondary | ICD-10-CM | POA: Diagnosis present

## 2023-12-26 LAB — CBC WITH DIFFERENTIAL/PLATELET
Abs Immature Granulocytes: 0.05 10*3/uL (ref 0.00–0.07)
Basophils Absolute: 0.1 10*3/uL (ref 0.0–0.1)
Basophils Relative: 1 %
Eosinophils Absolute: 0.2 10*3/uL (ref 0.0–0.5)
Eosinophils Relative: 2 %
HCT: 39.2 % (ref 36.0–46.0)
Hemoglobin: 12.1 g/dL (ref 12.0–15.0)
Immature Granulocytes: 1 %
Lymphocytes Relative: 30 %
Lymphs Abs: 2.9 10*3/uL (ref 0.7–4.0)
MCH: 29 pg (ref 26.0–34.0)
MCHC: 30.9 g/dL (ref 30.0–36.0)
MCV: 94 fL (ref 80.0–100.0)
Monocytes Absolute: 1 10*3/uL (ref 0.1–1.0)
Monocytes Relative: 10 %
Neutro Abs: 5.6 10*3/uL (ref 1.7–7.7)
Neutrophils Relative %: 56 %
Platelets: 255 10*3/uL (ref 150–400)
RBC: 4.17 MIL/uL (ref 3.87–5.11)
RDW: 13.4 % (ref 11.5–15.5)
WBC: 9.7 10*3/uL (ref 4.0–10.5)
nRBC: 0 % (ref 0.0–0.2)

## 2023-12-26 LAB — BRAIN NATRIURETIC PEPTIDE: B Natriuretic Peptide: 30.4 pg/mL (ref 0.0–100.0)

## 2023-12-26 LAB — URINALYSIS, ROUTINE W REFLEX MICROSCOPIC
Bilirubin Urine: NEGATIVE
Glucose, UA: NEGATIVE mg/dL
Hgb urine dipstick: NEGATIVE
Ketones, ur: NEGATIVE mg/dL
Nitrite: POSITIVE — AB
Protein, ur: NEGATIVE mg/dL
Specific Gravity, Urine: 1.016 (ref 1.005–1.030)
pH: 5 (ref 5.0–8.0)

## 2023-12-26 LAB — COMPREHENSIVE METABOLIC PANEL
ALT: <5 U/L (ref 0–44)
AST: 21 U/L (ref 15–41)
Albumin: 3.8 g/dL (ref 3.5–5.0)
Alkaline Phosphatase: 79 U/L (ref 38–126)
Anion gap: 11 (ref 5–15)
BUN: 40 mg/dL — ABNORMAL HIGH (ref 8–23)
CO2: 26 mmol/L (ref 22–32)
Calcium: 9 mg/dL (ref 8.9–10.3)
Chloride: 104 mmol/L (ref 98–111)
Creatinine, Ser: 2.5 mg/dL — ABNORMAL HIGH (ref 0.44–1.00)
GFR, Estimated: 18 mL/min — ABNORMAL LOW (ref >60)
Glucose, Bld: 103 mg/dL — ABNORMAL HIGH (ref 70–99)
Potassium: 4.8 mmol/L (ref 3.5–5.1)
Sodium: 141 mmol/L (ref 135–145)
Total Bilirubin: 1 mg/dL (ref 0.0–1.2)
Total Protein: 7.3 g/dL (ref 6.5–8.1)

## 2023-12-26 LAB — RESP PANEL BY RT-PCR (RSV, FLU A&B, COVID)  RVPGX2
Influenza A by PCR: NEGATIVE
Influenza B by PCR: NEGATIVE
Resp Syncytial Virus by PCR: NEGATIVE
SARS Coronavirus 2 by RT PCR: NEGATIVE

## 2023-12-26 LAB — SODIUM, URINE, RANDOM: Sodium, Ur: 77 mmol/L

## 2023-12-26 LAB — HEMOGLOBIN A1C
Hgb A1c MFr Bld: 5.7 % — ABNORMAL HIGH (ref 4.8–5.6)
Mean Plasma Glucose: 116.89 mg/dL

## 2023-12-26 LAB — TROPONIN I (HIGH SENSITIVITY)
Troponin I (High Sensitivity): 3 ng/L (ref <18)
Troponin I (High Sensitivity): 5 ng/L (ref <18)

## 2023-12-26 LAB — CREATININE, URINE, RANDOM: Creatinine, Urine: 184 mg/dL

## 2023-12-26 MED ORDER — INSULIN ASPART 100 UNIT/ML IJ SOLN
0.0000 [IU] | Freq: Every day | INTRAMUSCULAR | Status: DC
  Filled 2023-12-26: qty 0.05

## 2023-12-26 MED ORDER — ACETAMINOPHEN 650 MG RE SUPP: 650.0000 mg | Freq: Four times a day (QID) | RECTAL | Status: DC | PRN

## 2023-12-26 MED ORDER — INSULIN ASPART 100 UNIT/ML IJ SOLN
0.0000 [IU] | Freq: Three times a day (TID) | INTRAMUSCULAR | Status: DC
  Administered 2023-12-28: 1 [IU] via SUBCUTANEOUS
  Filled 2023-12-26: qty 0.09

## 2023-12-26 MED ORDER — LACTATED RINGERS IV BOLUS
1000.0000 mL | Freq: Once | INTRAVENOUS | Status: AC
  Administered 2023-12-26: 1000 mL via INTRAVENOUS

## 2023-12-26 MED ORDER — POLYETHYLENE GLYCOL 3350 17 G PO PACK: 17.0000 g | PACK | Freq: Every day | ORAL | Status: DC | PRN

## 2023-12-26 MED ORDER — APIXABAN 2.5 MG PO TABS
2.5000 mg | ORAL_TABLET | Freq: Two times a day (BID) | ORAL | Status: DC
  Administered 2023-12-26 – 2023-12-28 (×5): 2.5 mg via ORAL
  Filled 2023-12-26 (×5): qty 1

## 2023-12-26 MED ORDER — ACETAMINOPHEN 325 MG PO TABS: 650.0000 mg | ORAL_TABLET | Freq: Four times a day (QID) | ORAL | Status: DC | PRN

## 2023-12-26 MED ORDER — SODIUM CHLORIDE 0.9 % IV BOLUS
500.0000 mL | Freq: Once | INTRAVENOUS | Status: AC
  Administered 2023-12-26: 500 mL via INTRAVENOUS

## 2023-12-26 MED ORDER — SODIUM CHLORIDE 0.9% FLUSH
3.0000 mL | Freq: Two times a day (BID) | INTRAVENOUS | Status: DC
  Administered 2023-12-26 – 2023-12-28 (×3): 3 mL via INTRAVENOUS

## 2023-12-26 NOTE — ED Notes (Signed)
[facility] contacted and staff stated she has a hx of dementia and alzheimers. She also has a hx of sundowning. She is usually a/o x1 (person).

## 2023-12-26 NOTE — Assessment & Plan Note (Addendum)
 Patient baseline creatinine around 1, now noted to be 2.5.  Patient's blood pressure is noted to be soft.  123/58 as well as 93/81.  Urinalysis is pending.  I will also check urine sodium and creatinine.  Check bladder scan once.  However my clinical impression is that this is due to Lasix causing AKI and dehydration.  Patient is getting 500 cc fluid bolus in the ER.  I will give another liter and hold lasix for now. Last DC summary in march 2024 suggest prior poor po intake issues.

## 2023-12-26 NOTE — Assessment & Plan Note (Signed)
Chronically documented in the chart, continue with apixaban.  Currently rate controlled.  Restart metoprolol in the morning if blood pressure is sufficiently good.

## 2023-12-26 NOTE — ED Provider Notes (Signed)
 [name] EMERGENCY DEPARTMENT AT [facility] Provider Note   CSN: [id number] Arrival date & time: 12/26/23  1449     History  Chief Complaint  Patient presents with   Weakness    [name] is a 89 y.o. female.  Patient has a history of a stroke and dementia.  She had an episode of syncope and weakness today.  Patient awake and alert now but oriented to person only  The history is provided by the patient and medical records. No language interpreter was used.  Weakness Severity:  Moderate Onset quality:  Sudden Timing:  Constant Progression:  Waxing and waning Chronicity:  Recurrent Context: not alcohol use   Relieved by:  Nothing Worsened by:  Nothing Ineffective treatments:  None tried      Home Medications Prior to Admission medications   Medication Sig Start Date End Date Taking? Authorizing Provider  acetaminophen  (TYLENOL ) 325 MG tablet Take 650 mg by mouth every 6 (six) hours as needed for mild pain, fever or headache.    [provider]  apixaban  (ELIQUIS ) 2.5 MG TABS tablet Take 1 tablet (2.5 mg total) by mouth 2 (two) times daily. Patient not taking: Reported on 01/27/2023 08/16/21   Lue Elsie BROCKS, MD  benzonatate  (TESSALON ) 200 MG capsule Take 1 capsule (200 mg total) by mouth 3 (three) times daily. 02/06/23   Samtani, Jai-Gurmukh, MD  cyanocobalamin  1000 MCG tablet Take 1,000 mcg by mouth daily.    [provider]  feeding supplement (ENSURE ENLIVE / ENSURE PLUS) LIQD Take 237 mLs by mouth 2 (two) times daily between meals. 09/30/21   Arlice Reichert, MD  ferrous gluconate (FERGON) 324 MG tablet Take 324 mg by mouth daily with breakfast. Patient not taking: Reported on 01/27/2023    [provider]  linagliptin  (TRADJENTA ) 5 MG TABS tablet Take 1 tablet (5 mg total) by mouth daily. 02/06/23   Samtani, Jai-Gurmukh, MD  Melatonin 3 MG TABS Take 3 mg by mouth at bedtime.    [provider]  memantine  (NAMENDA )  10 MG tablet Take 1 tablet (10 mg total) by mouth 2 (two) times daily. 02/06/23   Samtani, Jai-Gurmukh, MD  metoprolol  tartrate (LOPRESSOR ) 25 MG tablet Take 0.5 tablets (12.5 mg total) by mouth 2 (two) times daily. 08/16/21   Lue Elsie BROCKS, MD  sertraline  (ZOLOFT ) 100 MG tablet Take 1 tablet (100 mg total) by mouth at bedtime. 02/06/23   Samtani, Jai-Gurmukh, MD  simvastatin  (ZOCOR ) 20 MG tablet Take 1 tablet (20 mg total) by mouth daily at 6 PM. 08/19/21   Lue Elsie BROCKS, MD      Allergies    Sulfa antibiotics, Sulfasalazine, and Latex    Review of Systems   Review of Systems  Unable to perform ROS: Mental status change  Neurological:  Positive for weakness.    Physical Exam Updated Vital Signs BP 93/81   Pulse 62   Temp 97.6 F (36.4 C) (Oral)   Resp 14   Ht 5' 2 (1.575 m)   Wt 68 kg   SpO2 96%   BMI 27.42 kg/m  Physical Exam Vitals and nursing note reviewed.  Constitutional:      Appearance: She is well-developed.  HENT:     Head: Normocephalic.     Nose: Nose normal.  Eyes:     General: No scleral icterus.    Conjunctiva/sclera: Conjunctivae normal.  Neck:     Thyroid : No thyromegaly.  Cardiovascular:     Rate  and Rhythm: Normal rate and regular rhythm.     Heart sounds: No murmur heard.    No friction rub. No gallop.  Pulmonary:     Breath sounds: No stridor. No wheezing or rales.  Chest:     Chest wall: No tenderness.  Abdominal:     General: There is no distension.     Tenderness: There is no abdominal tenderness. There is no rebound.  Musculoskeletal:        General: Normal range of motion.     Cervical back: Neck supple.  Lymphadenopathy:     Cervical: No cervical adenopathy.  Skin:    Findings: No erythema or rash.  Neurological:     Mental Status: She is alert and oriented to person, place, and time.     Motor: No abnormal muscle tone.     Coordination: Coordination normal.  Psychiatric:        Behavior: Behavior normal.     ED  Results / Procedures / Treatments   Labs (all labs ordered are listed, but only abnormal results are displayed) Labs Reviewed  COMPREHENSIVE METABOLIC PANEL - Abnormal; Notable for the following components:      Result Value   Glucose, Bld 103 (*)    BUN 40 (*)    Creatinine, Ser 2.50 (*)    GFR, Estimated 18 (*)    All other components within normal limits  RESP PANEL BY RT-PCR (RSV, FLU A&B, COVID)  [id number]  CBC WITH DIFFERENTIAL/PLATELET  BRAIN NATRIURETIC PEPTIDE  LACTIC ACID, PLASMA  LACTIC ACID, PLASMA  URINALYSIS, ROUTINE W REFLEX MICROSCOPIC  TROPONIN I (HIGH SENSITIVITY)  TROPONIN I (HIGH SENSITIVITY)    EKG EKG Interpretation Date/Time:  Tuesday December 26 2023 15:37:52 EST Ventricular Rate:  63 PR Interval:  188 QRS Duration:  91 QT Interval:  436 QTC Calculation: 447 R Axis:   23  Text Interpretation: Sinus rhythm Low voltage, precordial leads Abnormal R-wave progression, early transition Confirmed by Suzette Pac [phone]) on 12/26/2023 7:33:25 PM  Radiology CT Head Wo Contrast Result Date: 12/26/2023 CLINICAL DATA:  Memory loss, altered mental status EXAM: CT HEAD WITHOUT CONTRAST TECHNIQUE: Contiguous axial images were obtained from the base of the skull through the vertex without intravenous contrast. RADIATION DOSE REDUCTION: This exam was performed according to the departmental dose-optimization program which includes automated exposure control, adjustment of the mA and/or kV according to patient size and/or use of iterative reconstruction technique. COMPARISON:  04/02/2023 FINDINGS: Brain: No evidence of acute infarction, hemorrhage, mass, mass effect, or midline shift. No hydrocephalus or extra-axial fluid collection. Unchanged advanced cerebral atrophy. Periventricular white matter changes, likely the sequela of chronic small vessel ischemic disease. Vascular: No hyperdense vessel. Skull: Negative for fracture or focal lesion. Sinuses/Orbits: Mucosal thickening in  the ethmoid air cells. Other: The mastoid air cells are well aerated. IMPRESSION: No acute intracranial process. Electronically Signed   By: Donald Campion M.D.   On: 12/26/2023 17:10   DG Chest Port 1 View Result Date: 12/26/2023 CLINICAL DATA:  Shortness of breath EXAM: PORTABLE CHEST 1 VIEW COMPARISON:  Chest radiograph dated 01/25/2023 FINDINGS: Patient is rotated to the right. Low lung volumes with bronchovascular crowding. No focal consolidations. No pleural effusion or pneumothorax. The heart size and mediastinal contours are within normal limits. No acute osseous abnormality. IMPRESSION: Low lung volumes with bronchovascular crowding. No focal consolidations. Electronically Signed   By: Limin  Xu M.D.   On: 12/26/2023 16:41    Procedures Procedures  Medications Ordered in ED Medications  sodium chloride  0.9 % bolus 500 mL (0 mLs Intravenous Stopped 12/26/23 1832)    ED Course/ Medical Decision Making/ A&P                                 Medical Decision Making Amount and/or Complexity of Data Reviewed Labs: ordered. Radiology: ordered. ECG/medicine tests: ordered.  Risk Decision regarding hospitalization.   Patient with an AKI.  She will be admitted to medicine        Final Clinical Impression(s) / ED Diagnoses Final diagnoses:  Dehydration    Rx / DC Orders ED Discharge Orders     None         Suzette Pac, MD 12/27/23 [phone]

## 2023-12-26 NOTE — H&P (Addendum)
 History and Physical    Patient: [name] FMW:[id number] DOB: September 16, 1932 DOA: 12/26/2023 DOS: the patient was seen and examined on 12/26/2023 PCP: Monique Kins, MD  Patient coming from: SNF  Chief Complaint:  Chief Complaint  Patient presents with   Weakness   HPI: [name] is a 89 y.o. female with medical history significant of dementia.  Therefore history is principally obtained from ER signout, record review.  Patient is a resident of a nursing home and was noted today to have a questionable transient loss of consciousness in her wheelchair.  There is no report of patient having any trauma.  No prior history of fever coughing etc.  No report of vomiting or diarrhea or tremors.  By all accounts patient did regain consciousness soon after.  Patient is brought in by EMS to [facility] ER.  And route patient's vital signs were stable.  In the ER there has been no fever vomiting or diarrhea.  Patient is found to have creatinine of 2.5 up from 1.058 months ago.  Medical evaluation is sought.  CT head was done was non actionable  Patient offers no complaints, is sleeping but arousable and engages in some conversation before pretending to sleep again. Review of Systems: As mentioned in the history of present illness. All other systems reviewed and are negative. Past Medical History:  Diagnosis Date   Allergies    Alzheimer disease (HCC)    STABLE   Anemia, iron deficiency 12/12/2011   Arthritis    RF   CKD (chronic kidney disease)    STAGE 3   Diabetes mellitus    no mes, diet only    Diarrhea    RESOLVED   Hypercholesterolemia    Hyperlipemia    Hypertension    Major depression    OA (osteoarthritis)    HAND/LEFT KNEE PAIN MILD   Osteoporosis    Stroke [facility])    Syncope    Past Surgical History:  Procedure Laterality Date   INTRAMEDULLARY (IM) NAIL INTERTROCHANTERIC Right 09/27/2021   Procedure: INTRAMEDULLARY (IM) NAIL INTERTROCHANTRIC;  Surgeon: Kendal Franky SQUIBB, MD;  Location: [facility] OR;  Service: Orthopedics;  Laterality: Right;   left knee arthroscopy     ORIF HUMERUS FRACTURE  12/09/2011   Procedure: OPEN REDUCTION INTERNAL FIXATION (ORIF) DISTAL HUMERUS FRACTURE;  Surgeon: Prentice LELON Pagan, MD;  Location: [facility] ORS;  Service: Orthopedics;  Laterality: Right;   OTHER SURGICAL HISTORY     left small toe bone spur removed    TONSILLECTOMY     Social History:  reports that she quit smoking about 14 years ago. Her smoking use included cigarettes. She has never used smokeless tobacco. She reports that she does not drink alcohol and does not use drugs.  Allergies  Allergen Reactions   Sulfa Antibiotics Hives, Diarrhea, Nausea And Vomiting and Swelling    Facial swelling ALSO   Sulfasalazine Hives and Swelling    Other Reaction(s): Diarrhea, GI Intolerance  Facial swelling ALSO   Latex Rash    NOT ON MAR    Family History  Problem Relation Age of Onset   Heart Problems Mother    Heart attack Maternal Grandmother        died from a heart attack   Stroke Neg Hx    Arrhythmia Neg Hx     Prior to Admission medications   Medication Sig Start Date End Date Taking? Authorizing Provider  acetaminophen  (TYLENOL ) 325 MG tablet Take 650 mg by mouth  every 6 (six) hours as needed for mild pain, fever or headache.    [provider]  apixaban  (ELIQUIS ) 2.5 MG TABS tablet Take 1 tablet (2.5 mg total) by mouth 2 (two) times daily. Patient not taking: Reported on 01/27/2023 08/16/21   Lue Elsie BROCKS, MD  benzonatate  (TESSALON ) 200 MG capsule Take 1 capsule (200 mg total) by mouth 3 (three) times daily. 02/06/23   Samtani, Jai-Gurmukh, MD  cyanocobalamin  1000 MCG tablet Take 1,000 mcg by mouth daily.    [provider]  feeding supplement (ENSURE ENLIVE / ENSURE PLUS) LIQD Take 237 mLs by mouth 2 (two) times daily between meals. 09/30/21   Arlice Reichert, MD  ferrous gluconate (FERGON) 324 MG tablet Take 324 mg by mouth daily with  breakfast. Patient not taking: Reported on 01/27/2023    [provider]  linagliptin  (TRADJENTA ) 5 MG TABS tablet Take 1 tablet (5 mg total) by mouth daily. 02/06/23   Samtani, Jai-Gurmukh, MD  Melatonin 3 MG TABS Take 3 mg by mouth at bedtime.    [provider]  memantine  (NAMENDA ) 10 MG tablet Take 1 tablet (10 mg total) by mouth 2 (two) times daily. 02/06/23   Samtani, Jai-Gurmukh, MD  metoprolol  tartrate (LOPRESSOR ) 25 MG tablet Take 0.5 tablets (12.5 mg total) by mouth 2 (two) times daily. 08/16/21   Lue Elsie BROCKS, MD  sertraline  (ZOLOFT ) 100 MG tablet Take 1 tablet (100 mg total) by mouth at bedtime. 02/06/23   Samtani, Jai-Gurmukh, MD  simvastatin  (ZOCOR ) 20 MG tablet Take 1 tablet (20 mg total) by mouth daily at 6 PM. 08/19/21   Lue Elsie BROCKS, MD    Physical Exam: Vitals:   12/26/23 1506 12/26/23 1512 12/26/23 1630 12/26/23 1930  BP: (!) 123/58  134/61 93/81  Pulse: 65  63 62  Resp: 15  14 14   Temp: 97.8 F (36.6 C)   97.6 F (36.4 C)  TempSrc: Oral   Oral  SpO2: 91%  94% 96%  Weight:  68 kg    Height:  5' 2 (1.575 m)     General: Reasonably nourished appearing lady in no distress.  Restful, tends to sleep.  However arousable, nonfocal exam.  Symmetric facies.  When asked where she is, patient looks around and tells me she is at the hospital.  However cannot tell me which 1.  Is able to give me name and date of birth. Respiratory exam: Bilateral intravesicular Cardiovascular exam S1-S2 normal Abdomen bowel sounds are normal all quadrants soft nontender Patient was chaperoned by staff tech [name].  There is stool around the rectal vault nonmelena not maroon does not appear to be extremely loose Extremities warm without edema Data Reviewed:  Labs on Admission:  Results for orders placed or performed during the hospital encounter of 12/26/23 (from the past 24 hours)  CBC with Differential     Status: None   Collection Time: 12/26/23  3:26 PM  Result  Value Ref Range   WBC 9.7 4.0 - 10.5 K/uL   RBC 4.17 3.87 - 5.11 MIL/uL   Hemoglobin 12.1 12.0 - 15.0 g/dL   HCT 60.7 63.9 - 53.9 %   MCV 94.0 80.0 - 100.0 fL   MCH 29.0 26.0 - 34.0 pg   MCHC 30.9 30.0 - 36.0 g/dL   RDW 86.5 88.4 - 84.4 %   Platelets 255 150 - 400 K/uL   nRBC 0.0 0.0 - 0.2 %   Neutrophils Relative % 56 %   Neutro Abs  5.6 1.7 - 7.7 K/uL   Lymphocytes Relative 30 %   Lymphs Abs 2.9 0.7 - 4.0 K/uL   Monocytes Relative 10 %   Monocytes Absolute 1.0 0.1 - 1.0 K/uL   Eosinophils Relative 2 %   Eosinophils Absolute 0.2 0.0 - 0.5 K/uL   Basophils Relative 1 %   Basophils Absolute 0.1 0.0 - 0.1 K/uL   Immature Granulocytes 1 %   Abs Immature Granulocytes 0.05 0.00 - 0.07 K/uL  Comprehensive metabolic panel     Status: Abnormal   Collection Time: 12/26/23  3:26 PM  Result Value Ref Range   Sodium 141 135 - 145 mmol/L   Potassium 4.8 3.5 - 5.1 mmol/L   Chloride 104 98 - 111 mmol/L   CO2 26 22 - 32 mmol/L   Glucose, Bld 103 (H) 70 - 99 mg/dL   BUN 40 (H) 8 - 23 mg/dL   Creatinine, Ser 7.49 (H) 0.44 - 1.00 mg/dL   Calcium  9.0 8.9 - 10.3 mg/dL   Total Protein 7.3 6.5 - 8.1 g/dL   Albumin 3.8 3.5 - 5.0 g/dL   AST 21 15 - 41 U/L   ALT <5 0 - 44 U/L   Alkaline Phosphatase 79 38 - 126 U/L   Total Bilirubin 1.0 0.0 - 1.2 mg/dL   GFR, Estimated 18 (L) >60 mL/min   Anion gap 11 5 - 15  Troponin I (High Sensitivity)     Status: None   Collection Time: 12/26/23  3:26 PM  Result Value Ref Range   Troponin I (High Sensitivity) 3 <18 ng/L  Brain natriuretic peptide     Status: None   Collection Time: 12/26/23  3:26 PM  Result Value Ref Range   B Natriuretic Peptide 30.4 0.0 - 100.0 pg/mL  Resp panel by RT-PCR (RSV, Flu A&B, Covid) Anterior Nasal Swab     Status: None   Collection Time: 12/26/23  3:59 PM   Specimen: Anterior Nasal Swab  Result Value Ref Range   SARS Coronavirus 2 by RT PCR NEGATIVE NEGATIVE   Influenza A by PCR NEGATIVE NEGATIVE   Influenza B by PCR  NEGATIVE NEGATIVE   Resp Syncytial Virus by PCR NEGATIVE NEGATIVE   Basic Metabolic Panel: Recent Labs  Lab 12/26/23 1526  NA 141  K 4.8  CL 104  CO2 26  GLUCOSE 103*  BUN 40*  CREATININE 2.50*  CALCIUM  9.0   Liver Function Tests: Recent Labs  Lab 12/26/23 1526  AST 21  ALT <5  ALKPHOS 79  BILITOT 1.0  PROT 7.3  ALBUMIN 3.8   No results for input(s): LIPASE, AMYLASE in the last 168 hours. No results for input(s): AMMONIA in the last 168 hours. CBC: Recent Labs  Lab 12/26/23 1526  WBC 9.7  NEUTROABS 5.6  HGB 12.1  HCT 39.2  MCV 94.0  PLT 255   Cardiac Enzymes: Recent Labs  Lab 12/26/23 1526  TROPONINIHS 3    BNP (last 3 results) No results for input(s): PROBNP in the last 8760 hours. CBG: No results for input(s): GLUCAP in the last 168 hours.  Radiological Exams on Admission:  CT Head Wo Contrast Result Date: 12/26/2023 CLINICAL DATA:  Memory loss, altered mental status EXAM: CT HEAD WITHOUT CONTRAST TECHNIQUE: Contiguous axial images were obtained from the base of the skull through the vertex without intravenous contrast. RADIATION DOSE REDUCTION: This exam was performed according to the departmental dose-optimization program which includes automated exposure control, adjustment of the mA and/or kV  according to patient size and/or use of iterative reconstruction technique. COMPARISON:  04/02/2023 FINDINGS: Brain: No evidence of acute infarction, hemorrhage, mass, mass effect, or midline shift. No hydrocephalus or extra-axial fluid collection. Unchanged advanced cerebral atrophy. Periventricular white matter changes, likely the sequela of chronic small vessel ischemic disease. Vascular: No hyperdense vessel. Skull: Negative for fracture or focal lesion. Sinuses/Orbits: Mucosal thickening in the ethmoid air cells. Other: The mastoid air cells are well aerated. IMPRESSION: No acute intracranial process. Electronically Signed   By: Donald Campion M.D.   On:  12/26/2023 17:10   DG Chest Port 1 View Result Date: 12/26/2023 CLINICAL DATA:  Shortness of breath EXAM: PORTABLE CHEST 1 VIEW COMPARISON:  Chest radiograph dated 01/25/2023 FINDINGS: Patient is rotated to the right. Low lung volumes with bronchovascular crowding. No focal consolidations. No pleural effusion or pneumothorax. The heart size and mediastinal contours are within normal limits. No acute osseous abnormality. IMPRESSION: Low lung volumes with bronchovascular crowding. No focal consolidations. Electronically Signed   By: Limin  Xu M.D.   On: 12/26/2023 16:41       Assessment and Plan: * AKI (acute kidney injury) (HCC) Patient baseline creatinine around 1, now noted to be 2.5.  Patient's blood pressure is noted to be soft.  123/58 as well as 93/81.  Urinalysis is pending.  I will also check urine sodium and creatinine.  Check bladder scan once.  However my clinical impression is that this is due to Lasix causing AKI and dehydration.  Patient is getting 500 cc fluid bolus in the ER.  I will give another liter and hold lasix for now. Last DC summary in march 2024 suggest prior poor po intake issues.  Syncope and collapse This is the presenting symptom of presentation, likely dehydration/hypotensive due to dehydration.  Will maintain on telemetry. Given ongoing use of apixaban , VTE is unlikely.   Paroxysmal atrial fibrillation (HCC) Chronically documented in the chart, continue with apixaban .  Currently rate controlled.  Restart metoprolol  in the morning if blood pressure is sufficiently good.   Please review med rec after pharmcy input.  Urine testing pending.    Advance Care Planning:   Code Status: Prior patient has gold DNR sheet in chart. See mediea section. [facility] has a PRIOR dated molst form with full code. Therefore will order DNR  Consults: none  Family Communication:  I called Daughter Casso, [name] on [phone] - left voicemail with call back instructions. - updates  provided. Code status clarified as above.  Severity of Illness: The appropriate patient status for this patient is INPATIENT. Inpatient status is judged to be reasonable and necessary in order to provide the required intensity of service to ensure the patient's safety. The patient's presenting symptoms, physical exam findings, and initial radiographic and laboratory data in the context of their chronic comorbidities is felt to place them at high risk for further clinical deterioration. Furthermore, it is not anticipated that the patient will be medically stable for discharge from the hospital within 2 midnights of admission.   * I certify that at the point of admission it is my clinical judgment that the patient will require inpatient hospital care spanning beyond 2 midnights from the point of admission due to high intensity of service, high risk for further deterioration and high frequency of surveillance required.*  Author: Jacqulyn Divine, MD 12/26/2023 8:00 PM  For on call review [email or web address].

## 2023-12-26 NOTE — ED Triage Notes (Signed)
Patient BIB EMS. Per EMS patient was found slummed over in her wheelchair. Patient is drowsy on arrival and A&Ox2. EMS did not know patient's baseline mentation. VSS.

## 2023-12-26 NOTE — Assessment & Plan Note (Addendum)
This is the presenting symptom of presentation, likely dehydration/hypotensive due to dehydration.  Will maintain on telemetry. Given ongoing use of apixaban, VTE is unlikely.

## 2023-12-27 DIAGNOSIS — N179 Acute kidney failure, unspecified: Secondary | ICD-10-CM | POA: Diagnosis not present

## 2023-12-27 LAB — BASIC METABOLIC PANEL
Anion gap: 8 (ref 5–15)
BUN: 31 mg/dL — ABNORMAL HIGH (ref 8–23)
CO2: 28 mmol/L (ref 22–32)
Calcium: 8.7 mg/dL — ABNORMAL LOW (ref 8.9–10.3)
Chloride: 104 mmol/L (ref 98–111)
Creatinine, Ser: 1.29 mg/dL — ABNORMAL HIGH (ref 0.44–1.00)
GFR, Estimated: 40 mL/min — ABNORMAL LOW (ref >60)
Glucose, Bld: 102 mg/dL — ABNORMAL HIGH (ref 70–99)
Potassium: 4 mmol/L (ref 3.5–5.1)
Sodium: 140 mmol/L (ref 135–145)

## 2023-12-27 LAB — CBC
HCT: 38.4 % (ref 36.0–46.0)
Hemoglobin: 11.8 g/dL — ABNORMAL LOW (ref 12.0–15.0)
MCH: 28.9 pg (ref 26.0–34.0)
MCHC: 30.7 g/dL (ref 30.0–36.0)
MCV: 94.1 fL (ref 80.0–100.0)
Platelets: 216 10*3/uL (ref 150–400)
RBC: 4.08 MIL/uL (ref 3.87–5.11)
RDW: 13.1 % (ref 11.5–15.5)
WBC: 7 10*3/uL (ref 4.0–10.5)
nRBC: 0 % (ref 0.0–0.2)

## 2023-12-27 LAB — APTT: aPTT: 29 s (ref 24–36)

## 2023-12-27 LAB — CBG MONITORING, ED
Glucose-Capillary: 88 mg/dL (ref 70–99)
Glucose-Capillary: 90 mg/dL (ref 70–99)

## 2023-12-27 LAB — GLUCOSE, CAPILLARY
Glucose-Capillary: 92 mg/dL (ref 70–99)
Glucose-Capillary: 96 mg/dL (ref 70–99)

## 2023-12-27 LAB — PROTIME-INR
INR: 1.2 (ref 0.8–1.2)
Prothrombin Time: 15.7 s — ABNORMAL HIGH (ref 11.4–15.2)

## 2023-12-27 MED ORDER — METOPROLOL TARTRATE 5 MG/5ML IV SOLN: 5.0000 mg | INTRAVENOUS | Status: DC | PRN

## 2023-12-27 MED ORDER — GLUCAGON HCL RDNA (DIAGNOSTIC) 1 MG IJ SOLR: 1.0000 mg | INTRAMUSCULAR | Status: DC | PRN

## 2023-12-27 MED ORDER — SERTRALINE HCL 100 MG PO TABS
100.0000 mg | ORAL_TABLET | Freq: Every day | ORAL | Status: DC
  Administered 2023-12-27 – 2023-12-28 (×2): 100 mg via ORAL
  Filled 2023-12-27 (×2): qty 1

## 2023-12-27 MED ORDER — SODIUM CHLORIDE 0.9 % IV SOLN
INTRAVENOUS | Status: DC
Start: 2023-12-27 — End: 2023-12-29
  Administered 2023-12-27: 50 mL/h via INTRAVENOUS

## 2023-12-27 MED ORDER — SENNOSIDES-DOCUSATE SODIUM 8.6-50 MG PO TABS: 1.0000 | ORAL_TABLET | Freq: Every evening | ORAL | Status: DC | PRN

## 2023-12-27 MED ORDER — METOPROLOL TARTRATE 12.5 MG HALF TABLET
12.5000 mg | ORAL_TABLET | Freq: Two times a day (BID) | ORAL | Status: DC
Start: 2023-12-27 — End: 2023-12-29
  Administered 2023-12-27 – 2023-12-28 (×4): 12.5 mg via ORAL
  Filled 2023-12-27 (×4): qty 1

## 2023-12-27 MED ORDER — MEMANTINE HCL 10 MG PO TABS
10.0000 mg | ORAL_TABLET | Freq: Two times a day (BID) | ORAL | Status: DC
  Administered 2023-12-27 – 2023-12-28 (×4): 10 mg via ORAL
  Filled 2023-12-27: qty 2
  Filled 2023-12-27 (×3): qty 1

## 2023-12-27 MED ORDER — OLANZAPINE 10 MG IM SOLR: 5.0000 mg | Freq: Four times a day (QID) | INTRAMUSCULAR | Status: DC | PRN

## 2023-12-27 MED ORDER — IPRATROPIUM-ALBUTEROL 0.5-2.5 (3) MG/3ML IN SOLN: 3.0000 mL | RESPIRATORY_TRACT | Status: DC | PRN

## 2023-12-27 MED ORDER — CEPHALEXIN 500 MG PO CAPS
500.0000 mg | ORAL_CAPSULE | Freq: Three times a day (TID) | ORAL | Status: DC
  Administered 2023-12-27 – 2023-12-28 (×5): 500 mg via ORAL
  Filled 2023-12-27 (×5): qty 1

## 2023-12-27 MED ORDER — TRAZODONE HCL 50 MG PO TABS
50.0000 mg | ORAL_TABLET | Freq: Every evening | ORAL | Status: DC | PRN
  Administered 2023-12-27: 50 mg via ORAL
  Filled 2023-12-27: qty 1

## 2023-12-27 MED ORDER — ONDANSETRON HCL 4 MG/2ML IJ SOLN: 4.0000 mg | Freq: Four times a day (QID) | INTRAMUSCULAR | Status: DC | PRN

## 2023-12-27 MED ORDER — SIMVASTATIN 20 MG PO TABS
20.0000 mg | ORAL_TABLET | Freq: Every day | ORAL | Status: DC
  Administered 2023-12-27 – 2023-12-28 (×2): 20 mg via ORAL
  Filled 2023-12-27 (×2): qty 1

## 2023-12-27 MED ORDER — GUAIFENESIN 100 MG/5ML PO LIQD: 5.0000 mL | ORAL | Status: DC | PRN

## 2023-12-27 MED ORDER — HYDRALAZINE HCL 20 MG/ML IJ SOLN: 10.0000 mg | INTRAMUSCULAR | Status: DC | PRN

## 2023-12-27 NOTE — Progress Notes (Signed)
 PROGRESS NOTE    [name]  FMW:[id number] DOB: 25-Oct-1932 DOA: 12/26/2023 PCP: Loreli Kins, MD    Brief Narrative:  89 year old with history of dementia brought to the hospital from nursing home for loss of consciousness in her wheelchair.  No other history.  CT of the head is negative.  Also noted to have AKI with creatinine of 2.5 which improved with IV fluids. She was also found to have mild UTI therefore she was started on p.o. Keflex .   Assessment & Plan:  Principal Problem:   AKI (acute kidney injury) (HCC) Active Problems:   Syncope and collapse   Paroxysmal atrial fibrillation (HCC)   Acute kidney injury Syncope - Suspect secondary to dehydration.  Admission creatinine 2.5, this is trended down with IV fluids.  Baseline creatinine is around 1.0.  Holding diuretics at this time.  No longer needs to be on telemetry  Urinary tract infection - Patient having difficult time expressing symptoms.  In the setting of AKI, poor hydration and change in mental status will opt to treat this.  Previously on the cultures precinct have grown pansensitive Klebsiella.  Will start 3 days of p.o. Keflex , EOT 2/7  History of paroxysmal atrial fibrillation -Metoprolol  twice daily, Eliquis   Diabetes mellitus type 2 -Sliding scale and Accu-Chek.  CHF with preserved ejection fraction -Will need to hold Lasix given dehydration and poor oral intake.  Continue Lopressor  for  History of dementia -Namenda  and Zoloft   Hyperlipidemia - Statin    DVT prophylaxis: Eliquis     Code Status: Do not attempt resuscitation (DNR) PRE-ARREST INTERVENTIONS DESIRED Family Communication:  called daughter.  Status is: Inpatient Remains inpatient appropriate because: Feels weak Hoping to dc tomorrow morning.    Subjective:  Feels better, but still weak.   Examination:  General exam: Appears calm and comfortable  Respiratory system: Clear to auscultation. Respiratory effort  normal. Cardiovascular system: S1 & S2 heard, RRR. No JVD, murmurs, rubs, gallops or clicks. No pedal edema. Gastrointestinal system: Abdomen is nondistended, soft and nontender. No organomegaly or masses felt. Normal bowel sounds heard. Central nervous system: Alert and oriented. No focal neurological deficits. Extremities: Symmetric 5 x 5 power. Skin: No rashes, lesions or ulcers Psychiatry: Judgement and insight appear normal. Mood & affect appropriate.                 Diet Orders (From admission, onward)     Start     Ordered   12/26/23 2015  Diet Carb Modified Fluid consistency: Thin; Room service appropriate? Yes  Diet effective now       Question Answer Comment  Diet-HS Snack? Nothing   Calorie Level Medium 1600-2000   Fluid consistency: Thin   Room service appropriate? Yes      12/26/23 2014            Objective: Vitals:   12/27/23 0545 12/27/23 0939 12/27/23 0954 12/27/23 0959  BP: (!) 111/54 (!) 112/51 129/76 129/76  Pulse: 65 64  64  Resp: 12   18  Temp: 97.8 F (36.6 C) 97.6 F (36.4 C) 97.6 F (36.4 C)   TempSrc:   Oral   SpO2: 96%   96%  Weight:      Height:        Intake/Output Summary (Last 24 hours) at 12/27/2023 1110 Last data filed at 12/27/2023 0921 Gross per 24 hour  Intake 1000 ml  Output 850 ml  Net 150 ml   Filed Weights   12/26/23 1512  Weight: 68 kg    Scheduled Meds:  apixaban   2.5 mg Oral BID   cephALEXin   500 mg Oral Q8H   insulin  aspart  0-5 Units Subcutaneous QHS   insulin  aspart  0-9 Units Subcutaneous TID WC   memantine   10 mg Oral BID   metoprolol  tartrate  12.5 mg Oral BID   sertraline   100 mg Oral QHS   simvastatin   20 mg Oral QHS   sodium chloride  flush  3 mL Intravenous Q12H   Continuous Infusions:  sodium chloride       Nutritional status     Body mass index is 27.42 kg/m.  Data Reviewed:   CBC: Recent Labs  Lab 12/26/23 1526 12/27/23 0509  WBC 9.7 7.0  NEUTROABS 5.6  --   HGB 12.1 11.8*   HCT 39.2 38.4  MCV 94.0 94.1  PLT 255 216   Basic Metabolic Panel: Recent Labs  Lab 12/26/23 1526 12/27/23 0509  NA 141 140  K 4.8 4.0  CL 104 104  CO2 26 28  GLUCOSE 103* 102*  BUN 40* 31*  CREATININE 2.50* 1.29*  CALCIUM  9.0 8.7*   GFR: Estimated Creatinine Clearance: 26.7 mL/min (A) (by C-G formula based on SCr of 1.29 mg/dL (H)). Liver Function Tests: Recent Labs  Lab 12/26/23 1526  AST 21  ALT <5  ALKPHOS 79  BILITOT 1.0  PROT 7.3  ALBUMIN 3.8   No results for input(s): LIPASE, AMYLASE in the last 168 hours. No results for input(s): AMMONIA in the last 168 hours. Coagulation Profile: Recent Labs  Lab 12/27/23 0509  INR 1.2   Cardiac Enzymes: No results for input(s): CKTOTAL, CKMB, CKMBINDEX, TROPONINI in the last 168 hours. BNP (last 3 results) No results for input(s): PROBNP in the last 8760 hours. HbA1C: Recent Labs    12/26/23 2109  HGBA1C 5.7*   CBG: Recent Labs  Lab 12/27/23 0744  GLUCAP 88   Lipid Profile: No results for input(s): CHOL, HDL, LDLCALC, TRIG, CHOLHDL, LDLDIRECT in the last 72 hours. Thyroid  Function Tests: No results for input(s): TSH, T4TOTAL, FREET4, T3FREE, THYROIDAB in the last 72 hours. Anemia Panel: No results for input(s): VITAMINB12, FOLATE, FERRITIN, TIBC, IRON, RETICCTPCT in the last 72 hours. Sepsis Labs: No results for input(s): PROCALCITON, LATICACIDVEN in the last 168 hours.  Recent Results (from the past 240 hours)  Resp panel by RT-PCR (RSV, Flu A&B, Covid) Anterior Nasal Swab     Status: None   Collection Time: 12/26/23  3:59 PM   Specimen: Anterior Nasal Swab  Result Value Ref Range Status   SARS Coronavirus 2 by RT PCR NEGATIVE NEGATIVE Final    Comment: (NOTE) SARS-CoV-2 target nucleic acids are NOT DETECTED.  The SARS-CoV-2 RNA is generally detectable in upper respiratory specimens during the acute phase of infection. The lowest concentration  of SARS-CoV-2 viral copies this assay can detect is 138 copies/mL. A negative result does not preclude SARS-Cov-2 infection and should not be used as the sole basis for treatment or other patient management decisions. A negative result may occur with  improper specimen collection/handling, submission of specimen other than nasopharyngeal swab, presence of viral mutation(s) within the areas targeted by this assay, and inadequate number of viral copies(<138 copies/mL). A negative result must be combined with clinical observations, patient history, and epidemiological information. The expected result is Negative.  Fact Sheet for Patients:  bloggercourse.com  Fact Sheet for Healthcare Providers:  seriousbroker.it  This test is no t yet approved or cleared by  the [place]  FDA and  has been authorized for detection and/or diagnosis of SARS-CoV-2 by FDA under an Emergency Use Authorization (EUA). This EUA will remain  in effect (meaning this test can be used) for the duration of the COVID-19 declaration under Section 564(b)(1) of the Act, 21 U.S.C.section 360bbb-3(b)(1), unless the authorization is terminated  or revoked sooner.       Influenza A by PCR NEGATIVE NEGATIVE Final   Influenza B by PCR NEGATIVE NEGATIVE Final    Comment: (NOTE) The Xpert Xpress SARS-CoV-2/FLU/RSV plus assay is intended as an aid in the diagnosis of influenza from Nasopharyngeal swab specimens and should not be used as a sole basis for treatment. Nasal washings and aspirates are unacceptable for Xpert Xpress SARS-CoV-2/FLU/RSV testing.  Fact Sheet for Patients: bloggercourse.com  Fact Sheet for Healthcare Providers: seriousbroker.it  This test is not yet approved or cleared by the [place]  FDA and has been authorized for detection and/or diagnosis of SARS-CoV-2 by FDA under an Emergency Use  Authorization (EUA). This EUA will remain in effect (meaning this test can be used) for the duration of the COVID-19 declaration under Section 564(b)(1) of the Act, 21 U.S.C. section 360bbb-3(b)(1), unless the authorization is terminated or revoked.     Resp Syncytial Virus by PCR NEGATIVE NEGATIVE Final    Comment: (NOTE) Fact Sheet for Patients: bloggercourse.com  Fact Sheet for Healthcare Providers: seriousbroker.it  This test is not yet approved or cleared by the [place]  FDA and has been authorized for detection and/or diagnosis of SARS-CoV-2 by FDA under an Emergency Use Authorization (EUA). This EUA will remain in effect (meaning this test can be used) for the duration of the COVID-19 declaration under Section 564(b)(1) of the Act, 21 U.S.C. section 360bbb-3(b)(1), unless the authorization is terminated or revoked.  Performed at [facility], [address], [place], [place] [address]          Radiology Studies: CT Head Wo Contrast Result Date: 12/26/2023 CLINICAL DATA:  Memory loss, altered mental status EXAM: CT HEAD WITHOUT CONTRAST TECHNIQUE: Contiguous axial images were obtained from the base of the skull through the vertex without intravenous contrast. RADIATION DOSE REDUCTION: This exam was performed according to the departmental dose-optimization program which includes automated exposure control, adjustment of the mA and/or kV according to patient size and/or use of iterative reconstruction technique. COMPARISON:  04/02/2023 FINDINGS: Brain: No evidence of acute infarction, hemorrhage, mass, mass effect, or midline shift. No hydrocephalus or extra-axial fluid collection. Unchanged advanced cerebral atrophy. Periventricular white matter changes, likely the sequela of chronic small vessel ischemic disease. Vascular: No hyperdense vessel. Skull: Negative for fracture or focal lesion. Sinuses/Orbits:  Mucosal thickening in the ethmoid air cells. Other: The mastoid air cells are well aerated. IMPRESSION: No acute intracranial process. Electronically Signed   By: Donald Campion M.D.   On: 12/26/2023 17:10   DG Chest Port 1 View Result Date: 12/26/2023 CLINICAL DATA:  Shortness of breath EXAM: PORTABLE CHEST 1 VIEW COMPARISON:  Chest radiograph dated 01/25/2023 FINDINGS: Patient is rotated to the right. Low lung volumes with bronchovascular crowding. No focal consolidations. No pleural effusion or pneumothorax. The heart size and mediastinal contours are within normal limits. No acute osseous abnormality. IMPRESSION: Low lung volumes with bronchovascular crowding. No focal consolidations. Electronically Signed   By: Limin  Xu M.D.   On: 12/26/2023 16:41           LOS: 1 day   Time spent= 35 mins  Burgess JAYSON Dare, MD [facility]  If 7PM-7AM, please contact night-coverage  12/27/2023, 11:10 AM

## 2023-12-27 NOTE — Hospital Course (Addendum)
 Brief Narrative:  89 year old with history of dementia brought to the hospital from nursing home for loss of consciousness in her wheelchair.  No other history.  CT of the head is negative.  Also noted to have AKI with creatinine of 2.5 which improved with IV fluids. She was also found to have mild UTI therefore she was started on p.o. Keflex . Overall patient is doing better, awaiting PT/OT evaluation.  If facility is able to take her back today we will discharge her.  Assessment & Plan:  Principal Problem:   AKI (acute kidney injury) (HCC) Active Problems:   Syncope and collapse   Paroxysmal atrial fibrillation (HCC)   Acute kidney injury Syncope - Suspect secondary to dehydration.  Admission creatinine 2.5, this is trended down with IV fluids.  Baseline creatinine is around 1.0.  Holding diuretics at this time.  No longer needs to be on telemetry  Urinary tract infection - Patient having difficult time expressing symptoms.  In the setting of AKI, poor hydration and change in mental status will opt to treat this.  Previously on the cultures precinct have grown pansensitive Klebsiella.  Will start 3 days of p.o. Keflex , EOT 2/7  History of paroxysmal atrial fibrillation -Metoprolol  twice daily, Eliquis   Diabetes mellitus type 2 -Sliding scale and Accu-Chek.  CHF with preserved ejection fraction -Will need to hold Lasix given dehydration and poor oral intake.  Continue Lopressor  for  History of dementia -Namenda  and Zoloft   Hyperlipidemia - Statin  PT/OT-HH  DVT prophylaxis: Eliquis     Code Status: Do not attempt resuscitation (DNR) PRE-ARREST INTERVENTIONS DESIRED Family Communication:  called daughter.  Status is: Inpatient Remains inpatient appropriate because: Feels weak May be DC later today    Subjective:  Still overall feeling weak.  Examination:  General exam: Appears calm and comfortable  Respiratory system: Clear to auscultation. Respiratory effort  normal. Cardiovascular system: S1 & S2 heard, RRR. No JVD, murmurs, rubs, gallops or clicks. No pedal edema. Gastrointestinal system: Abdomen is nondistended, soft and nontender. No organomegaly or masses felt. Normal bowel sounds heard. Central nervous system: Alert and oriented. No focal neurological deficits. Extremities: Symmetric 5 x 5 power. Skin: No rashes, lesions or ulcers Psychiatry: Judgement and insight appear normal. Mood & affect appropriate.

## 2023-12-28 DIAGNOSIS — N179 Acute kidney failure, unspecified: Secondary | ICD-10-CM | POA: Diagnosis not present

## 2023-12-28 LAB — GLUCOSE, CAPILLARY
Glucose-Capillary: 104 mg/dL — ABNORMAL HIGH (ref 70–99)
Glucose-Capillary: 95 mg/dL (ref 70–99)
Glucose-Capillary: 127 mg/dL — ABNORMAL HIGH (ref 70–99)
Glucose-Capillary: 133 mg/dL — ABNORMAL HIGH (ref 70–99)

## 2023-12-28 LAB — MAGNESIUM: Magnesium: 1.8 mg/dL (ref 1.7–2.4)

## 2023-12-28 LAB — CBC
HCT: 35.2 % — ABNORMAL LOW (ref 36.0–46.0)
Hemoglobin: 11.1 g/dL — ABNORMAL LOW (ref 12.0–15.0)
MCH: 29 pg (ref 26.0–34.0)
MCHC: 31.5 g/dL (ref 30.0–36.0)
MCV: 91.9 fL (ref 80.0–100.0)
Platelets: 210 10*3/uL (ref 150–400)
RBC: 3.83 MIL/uL — ABNORMAL LOW (ref 3.87–5.11)
RDW: 12.9 % (ref 11.5–15.5)
WBC: 8.3 10*3/uL (ref 4.0–10.5)
nRBC: 0 % (ref 0.0–0.2)

## 2023-12-28 LAB — BASIC METABOLIC PANEL
Anion gap: 11 (ref 5–15)
BUN: 20 mg/dL (ref 8–23)
CO2: 26 mmol/L (ref 22–32)
Calcium: 8.8 mg/dL — ABNORMAL LOW (ref 8.9–10.3)
Chloride: 101 mmol/L (ref 98–111)
Creatinine, Ser: 1.1 mg/dL — ABNORMAL HIGH (ref 0.44–1.00)
GFR, Estimated: 48 mL/min — ABNORMAL LOW (ref >60)
Glucose, Bld: 100 mg/dL — ABNORMAL HIGH (ref 70–99)
Potassium: 3.8 mmol/L (ref 3.5–5.1)
Sodium: 138 mmol/L (ref 135–145)

## 2023-12-28 LAB — PHOSPHORUS: Phosphorus: 2.9 mg/dL (ref 2.5–4.6)

## 2023-12-28 MED ORDER — CEPHALEXIN 500 MG PO CAPS: 500.0000 mg | ORAL_CAPSULE | Freq: Three times a day (TID) | ORAL | Status: AC

## 2023-12-28 NOTE — NC FL2 (Signed)
 [place]  MEDICAID FL2 LEVEL OF CARE FORM     IDENTIFICATION  Patient Name: [name] Birthdate: June 16, 1932 Sex: female Admission Date (Current Location): 12/26/2023  [facility] and [place] Number:  Producer, Television/film/video and Address:  [facility], [address] [place], [place] [address]      Provider Number: [id number]  Attending Physician Name and Address:  Caleen Burgess BROCKS, MD  Relative Name and Phone Number:  [name] (Daughter)  [phone] (Mobile)    Current Level of Care: Hospital Recommended Level of Care: Assisted Living Facility Prior Approval Number:    Date Approved/Denied:   PASRR Number:    Discharge Plan: Other (Comment) (ALF)    Current Diagnoses: Patient Active Problem List   Diagnosis Date Noted   AKI (acute kidney injury) (HCC) 12/26/2023   COVID-19 virus infection 01/26/2023   Acute respiratory failure with hypoxia (HCC) 01/26/2023   Generalized weakness 01/26/2023   Chronic diastolic CHF (congestive heart failure) (HCC) 01/26/2023   Acute cystitis 08/07/2022   Acute cystitis without hematuria 08/06/2022   Scalp hematoma 08/06/2022   Fall 08/06/2022   Distal radial fracture 08/06/2022   Hypertensive urgency 08/06/2022   Paroxysmal atrial fibrillation (HCC) 08/06/2022   Chronic anticoagulation 08/06/2022   Acute on chronic renal insufficiency 08/06/2022   (HFpEF) heart failure with preserved ejection fraction (HCC) 08/06/2022   Closed right hip fracture, initial encounter (HCC) 09/26/2021   Head trauma 09/26/2021   Hypocalcemia 09/26/2021   Hyperglycemia 09/26/2021   Acute metabolic encephalopathy 08/10/2021   COVID-19 08/09/2021   History of syncope 08/13/2019   Syncope and collapse 01/23/2019   Leukocytosis 01/23/2019   Diarrhea 01/23/2019   Prolonged QT interval 01/23/2019   Acute confusional state 06/19/2015   Memory loss 06/19/2015   DM2 (diabetes mellitus, type 2) (HCC) 05/14/2015   Hyperlipidemia  05/14/2015   Essential hypertension    TIA (transient ischemic attack) 05/13/2015   Anemia, iron deficiency 12/12/2011   Fracture of proximal end of left humerus 12/11/2011   Hypoxia 12/11/2011   Wide-complex tachycardia 12/11/2011   Acute hypotension 12/11/2011    Orientation RESPIRATION BLADDER Height & Weight     Self, Time, Situation, Place  Normal Incontinent Weight: 149 lb 14.6 oz (68 kg) Height:  5' 2 (157.5 cm)  BEHAVIORAL SYMPTOMS/MOOD NEUROLOGICAL BOWEL NUTRITION STATUS      Incontinent Diet (Regular)  AMBULATORY STATUS COMMUNICATION OF NEEDS Skin   Limited Assist Verbally Normal                       Personal Care Assistance Level of Assistance  Bathing, Feeding, Dressing Bathing Assistance: Maximum assistance Feeding assistance: Independent Dressing Assistance: Limited assistance     Functional Limitations Info  Sight, Speech, Hearing Sight Info: Adequate Hearing Info: Adequate Speech Info: Adequate    SPECIAL CARE FACTORS FREQUENCY  OT (By licensed OT), PT (By licensed PT)     PT Frequency: 2-3x/week OT Frequency: 2-3x/week            Contractures Contractures Info: Not present    Additional Factors Info  Code Status, Allergies Code Status Info: DNR Allergies Info: Sulfa Antibiotics, Latex           Current Medications (12/28/2023):  This is the current hospital active medication list Current Facility-Administered Medications  Medication Dose Route Frequency Provider Last Rate Last Admin   0.9 %  sodium chloride  infusion   Intravenous Continuous Amin, Ankit C, MD 50 mL/hr at  12/28/23 0932 New Bag at 12/28/23 0932   acetaminophen  (TYLENOL ) tablet 650 mg  650 mg Oral Q6H PRN Moody Alto, MD       Or   acetaminophen  (TYLENOL ) suppository 650 mg  650 mg Rectal Q6H PRN Moody Alto, MD       apixaban  (ELIQUIS ) tablet 2.5 mg  2.5 mg Oral BID Goel, Hersh, MD   2.5 mg at 12/28/23 9071   cephALEXin  (KEFLEX ) capsule 500 mg  500 mg Oral Q8H Amin,  Ankit C, MD   500 mg at 12/28/23 0603   glucagon  (human recombinant) (GLUCAGEN) injection 1 mg  1 mg Intravenous PRN Amin, Ankit C, MD       guaiFENesin  (ROBITUSSIN) 100 MG/5ML liquid 5 mL  5 mL Oral Q4H PRN Amin, Ankit C, MD       hydrALAZINE  (APRESOLINE ) injection 10 mg  10 mg Intravenous Q4H PRN Amin, Ankit C, MD       insulin  aspart (novoLOG ) injection 0-5 Units  0-5 Units Subcutaneous QHS Moody Alto, MD       insulin  aspart (novoLOG ) injection 0-9 Units  0-9 Units Subcutaneous TID WC Goel, Hersh, MD       ipratropium-albuterol  (DUONEB) 0.5-2.5 (3) MG/3ML nebulizer solution 3 mL  3 mL Nebulization Q4H PRN Amin, Ankit C, MD       memantine  (NAMENDA ) tablet 10 mg  10 mg Oral BID Amin, Ankit C, MD   10 mg at 12/28/23 9071   metoprolol  tartrate (LOPRESSOR ) injection 5 mg  5 mg Intravenous Q4H PRN Amin, Ankit C, MD       metoprolol  tartrate (LOPRESSOR ) tablet 12.5 mg  12.5 mg Oral BID Amin, Ankit C, MD   12.5 mg at 12/28/23 9071   OLANZapine  (ZYPREXA ) injection 5 mg  5 mg Intramuscular Q6H PRN Amin, Ankit C, MD       ondansetron  (ZOFRAN ) injection 4 mg  4 mg Intravenous Q6H PRN Amin, Ankit C, MD       polyethylene glycol (MIRALAX  / GLYCOLAX ) packet 17 g  17 g Oral Daily PRN Moody Alto, MD       senna-docusate (Senokot-S) tablet 1 tablet  1 tablet Oral QHS PRN Amin, Ankit C, MD       sertraline  (ZOLOFT ) tablet 100 mg  100 mg Oral QHS Amin, Ankit C, MD   100 mg at 12/27/23 2306   simvastatin  (ZOCOR ) tablet 20 mg  20 mg Oral QHS Amin, Ankit C, MD   20 mg at 12/27/23 2308   sodium chloride  flush (NS) 0.9 % injection 3 mL  3 mL Intravenous Q12H Goel, Hersh, MD   3 mL at 12/28/23 9071   traZODone  (DESYREL ) tablet 50 mg  50 mg Oral QHS PRN Amin, Ankit C, MD   50 mg at 12/27/23 2306     Discharge Medications: Please see discharge summary for a list of discharge medications.  Relevant Imaging Results:  Relevant Lab Results:   Additional Information SS#[id number]  Monique SHAUNNA Cumming,  LCSW

## 2023-12-28 NOTE — Discharge Summary (Signed)
 Physician Discharge Summary  [name] FMW:[id number] DOB: [birth year] DOA: 12/26/2023  PCP: Loreli Kins, MD  Admit date: 12/26/2023 Discharge date: 12/28/2023  Admitted From: ALF Disposition:  ALF  Recommendations for Outpatient Follow-up:  Follow up with PCP in 1-2 weeks Please obtain BMP/CBC in one week your next doctors visit.  PO Keflex  as below   Discharge Condition: Stable CODE STATUS: DNR Diet recommendation: Regular.   Brief/Interim Summary: Brief Narrative:  89 year old with history of dementia brought to the hospital from nursing home for loss of consciousness in her wheelchair.  No other history.  CT of the head is negative.  Also noted to have AKI with creatinine of 2.5 which improved with IV fluids. She was also found to have mild UTI therefore she was started on p.o. Keflex . Overall patient is doing better, awaiting PT/OT evaluation.  If facility is able to take her back today we will discharge her.  Assessment & Plan:  Principal Problem:   AKI (acute kidney injury) (HCC) Active Problems:   Syncope and collapse   Paroxysmal atrial fibrillation (HCC)   Acute kidney injury Syncope - Suspect secondary to dehydration.  Admission creatinine 2.5, this is trended down with IV fluids.  Baseline creatinine is around 1.0.  Holding diuretics at this time.  No longer needs to be on telemetry  Urinary tract infection - Patient having difficult time expressing symptoms.  In the setting of AKI, poor hydration and change in mental status will opt to treat this.  Previously on the cultures precinct have grown pansensitive Klebsiella.  Will start 3 days of p.o. Keflex , EOT 2/7  History of paroxysmal atrial fibrillation -Metoprolol  twice daily, Eliquis   Diabetes mellitus type 2 -Sliding scale and Accu-Chek.  CHF with preserved ejection fraction -Will need to hold Lasix given dehydration and poor oral intake.  Continue Lopressor  for  History of dementia -Namenda  and  Zoloft   Hyperlipidemia - Statin  PT/OT-HH  DVT prophylaxis: Eliquis     Code Status: Do not attempt resuscitation (DNR) PRE-ARREST INTERVENTIONS DESIRED Family Communication:  called daughter.  Status is: Inpatient Remains inpatient appropriate because: Feels weak May be DC later today    Subjective:  Still overall feeling weak.  Examination:  General exam: Appears calm and comfortable  Respiratory system: Clear to auscultation. Respiratory effort normal. Cardiovascular system: S1 & S2 heard, RRR. No JVD, murmurs, rubs, gallops or clicks. No pedal edema. Gastrointestinal system: Abdomen is nondistended, soft and nontender. No organomegaly or masses felt. Normal bowel sounds heard. Central nervous system: Alert and oriented. No focal neurological deficits. Extremities: Symmetric 5 x 5 power. Skin: No rashes, lesions or ulcers Psychiatry: Judgement and insight appear normal. Mood & affect appropriate.     Discharge Diagnoses:  Principal Problem:   AKI (acute kidney injury) [facility]) Active Problems:   Syncope and collapse   Paroxysmal atrial fibrillation (HCC)      Discharge Exam: Vitals:   12/28/23 0542 12/28/23 0949  BP: (!) 157/62   Pulse: 64 65  Resp: 18   Temp: 97.9 F (36.6 C)   SpO2: 91% 98%   Vitals:   12/27/23 2117 12/27/23 2302 12/28/23 0542 12/28/23 0949  BP: (!) 150/54 (!) 144/57 (!) 157/62   Pulse: 67 66 64 65  Resp: 18 18 18    Temp: 98.3 F (36.8 C) 98.5 F (36.9 C) 97.9 F (36.6 C)   TempSrc: Oral Oral Oral   SpO2: 93% 94% 91% 98%  Weight:      Height:  Discharge Instructions   Allergies as of 12/28/2023       Reactions   Sulfa Antibiotics Hives, Diarrhea, Nausea And Vomiting, Swelling, Other (See Comments)   Facial swelling and GI intolerance, ALSO   Latex Rash, Other (See Comments)   Allergic, per [facility]        Medication List     TAKE these medications    acetaminophen  500 MG tablet Commonly known as:  TYLENOL  Take 1,000 mg by mouth in the morning.   acetaminophen  325 MG tablet Commonly known as: TYLENOL  Take 650 mg by mouth every 6 (six) hours as needed (for pain or fever).   albuterol  108 (90 Base) MCG/ACT inhaler Commonly known as: VENTOLIN  HFA Inhale 2 puffs into the lungs every 4 (four) hours as needed for wheezing or shortness of breath.   apixaban  2.5 MG Tabs tablet Commonly known as: ELIQUIS  Take 1 tablet (2.5 mg total) by mouth 2 (two) times daily.   benzonatate  200 MG capsule Commonly known as: TESSALON  Take 1 capsule (200 mg total) by mouth 3 (three) times daily. What changed: when to take this   cephALEXin  500 MG capsule Commonly known as: KEFLEX  Take 1 capsule (500 mg total) by mouth every 8 (eight) hours for 1 day.   cyanocobalamin  1000 MCG tablet Take 1,000 mcg by mouth daily.   Ensure Original Liqd Take 237 mLs by mouth See admin instructions. Chocolate flavor: Drink 237 ml's by mouth 2 times a day between meals   feeding supplement Liqd Take 237 mLs by mouth 2 (two) times daily between meals.   furosemide 20 MG tablet Commonly known as: LASIX Take 20 mg by mouth See admin instructions. Take 20 mg by mouth in the morning on Monday(s) only   linagliptin  5 MG Tabs tablet Commonly known as: TRADJENTA  Take 1 tablet (5 mg total) by mouth daily.   melatonin 3 MG Tabs tablet Take 3 mg by mouth at bedtime.   memantine  10 MG tablet Commonly known as: NAMENDA  Take 1 tablet (10 mg total) by mouth 2 (two) times daily.   metoprolol  tartrate 25 MG tablet Commonly known as: LOPRESSOR  Take 0.5 tablets (12.5 mg total) by mouth 2 (two) times daily.   nystatin powder Apply 1 Application topically See admin instructions. Apply to the the bilateral groin/thigh area in the morning and at bedtime   sertraline  100 MG tablet Commonly known as: ZOLOFT  Take 1 tablet (100 mg total) by mouth at bedtime.   simvastatin  20 MG tablet Commonly known as: ZOCOR  Take 1  tablet (20 mg total) by mouth daily at 6 PM. What changed: when to take this        Follow-up Information     Loreli Kins, MD Follow up in 1 week(s).   Specialty: Family Medicine Contact information: [address] [place] [address] [phone]                Allergies  Allergen Reactions   Sulfa Antibiotics Hives, Diarrhea, Nausea And Vomiting, Swelling and Other (See Comments)    Facial swelling and GI intolerance, ALSO   Latex Rash and Other (See Comments)    Allergic, per [facility]    You were cared for by a hospitalist during your hospital stay. If you have any questions about your discharge medications or the care you received while you were in the hospital after you are discharged, you can call the unit and asked to speak with the hospitalist on call if the hospitalist  that took care of you is not available. Once you are discharged, your primary care physician will handle any further medical issues. Please note that no refills for any discharge medications will be authorized once you are discharged, as it is imperative that you return to your primary care physician (or establish a relationship with a primary care physician if you do not have one) for your aftercare needs so that they can reassess your need for medications and monitor your lab values.  You were cared for by a hospitalist during your hospital stay. If you have any questions about your discharge medications or the care you received while you were in the hospital after you are discharged, you can call the unit and asked to speak with the hospitalist on call if the hospitalist that took care of you is not available. Once you are discharged, your primary care physician will handle any further medical issues. Please note that NO REFILLS for any discharge medications will be authorized once you are discharged, as it is imperative that you return to your primary care physician (or establish a  relationship with a primary care physician if you do not have one) for your aftercare needs so that they can reassess your need for medications and monitor your lab values.  Please request your Prim.MD to go over all Hospital Tests and Procedure/Radiological results at the follow up, please get all Hospital records sent to your Prim MD by signing hospital release before you go home.  Get CBC, CMP, 2 view Chest X ray checked  by Primary MD during your next visit or SNF MD in 5-7 days ( we routinely change or add medications that can affect your baseline labs and fluid status, therefore we recommend that you get the mentioned basic workup next visit with your PCP, your PCP may decide not to get them or add new tests based on their clinical decision)  On your next visit with your primary care physician please Get Medicines reviewed and adjusted.  If you experience worsening of your admission symptoms, develop shortness of breath, life threatening emergency, suicidal or homicidal thoughts you must seek medical attention immediately by calling 911 or calling your MD immediately  if symptoms less severe.  You Must read complete instructions/literature along with all the possible adverse reactions/side effects for all the Medicines you take and that have been prescribed to you. Take any new Medicines after you have completely understood and accpet all the possible adverse reactions/side effects.   Do not drive, operate heavy machinery, perform activities at heights, swimming or participation in water  activities or provide baby sitting services if your were admitted for syncope or siezures until you have seen by Primary MD or a Neurologist and advised to do so again.  Do not drive when taking Pain medications.   Procedures/Studies: CT Head Wo Contrast Result Date: 12/26/2023 CLINICAL DATA:  Memory loss, altered mental status EXAM: CT HEAD WITHOUT CONTRAST TECHNIQUE: Contiguous axial images were obtained  from the base of the skull through the vertex without intravenous contrast. RADIATION DOSE REDUCTION: This exam was performed according to the departmental dose-optimization program which includes automated exposure control, adjustment of the mA and/or kV according to patient size and/or use of iterative reconstruction technique. COMPARISON:  04/02/2023 FINDINGS: Brain: No evidence of acute infarction, hemorrhage, mass, mass effect, or midline shift. No hydrocephalus or extra-axial fluid collection. Unchanged advanced cerebral atrophy. Periventricular white matter changes, likely the sequela of chronic small vessel ischemic disease. Vascular: No  hyperdense vessel. Skull: Negative for fracture or focal lesion. Sinuses/Orbits: Mucosal thickening in the ethmoid air cells. Other: The mastoid air cells are well aerated. IMPRESSION: No acute intracranial process. Electronically Signed   By: Donald Campion M.D.   On: 12/26/2023 17:10   DG Chest Port 1 View Result Date: 12/26/2023 CLINICAL DATA:  Shortness of breath EXAM: PORTABLE CHEST 1 VIEW COMPARISON:  Chest radiograph dated 01/25/2023 FINDINGS: Patient is rotated to the right. Low lung volumes with bronchovascular crowding. No focal consolidations. No pleural effusion or pneumothorax. The heart size and mediastinal contours are within normal limits. No acute osseous abnormality. IMPRESSION: Low lung volumes with bronchovascular crowding. No focal consolidations. Electronically Signed   By: Limin  Xu M.D.   On: 12/26/2023 16:41     The results of significant diagnostics from this hospitalization (including imaging, microbiology, ancillary and laboratory) are listed below for reference.     Microbiology: Recent Results (from the past 240 hours)  Resp panel by RT-PCR (RSV, Flu A&B, Covid) Anterior Nasal Swab     Status: None   Collection Time: 12/26/23  3:59 PM   Specimen: Anterior Nasal Swab  Result Value Ref Range Status   SARS Coronavirus 2 by RT PCR  NEGATIVE NEGATIVE Final    Comment: (NOTE) SARS-CoV-2 target nucleic acids are NOT DETECTED.  The SARS-CoV-2 RNA is generally detectable in upper respiratory specimens during the acute phase of infection. The lowest concentration of SARS-CoV-2 viral copies this assay can detect is 138 copies/mL. A negative result does not preclude SARS-Cov-2 infection and should not be used as the sole basis for treatment or other patient management decisions. A negative result may occur with  improper specimen collection/handling, submission of specimen other than nasopharyngeal swab, presence of viral mutation(s) within the areas targeted by this assay, and inadequate number of viral copies(<138 copies/mL). A negative result must be combined with clinical observations, patient history, and epidemiological information. The expected result is Negative.  Fact Sheet for Patients:  bloggercourse.com  Fact Sheet for Healthcare Providers:  seriousbroker.it  This test is no t yet approved or cleared by the [place]  FDA and  has been authorized for detection and/or diagnosis of SARS-CoV-2 by FDA under an Emergency Use Authorization (EUA). This EUA will remain  in effect (meaning this test can be used) for the duration of the COVID-19 declaration under Section 564(b)(1) of the Act, 21 U.S.C.section 360bbb-3(b)(1), unless the authorization is terminated  or revoked sooner.       Influenza A by PCR NEGATIVE NEGATIVE Final   Influenza B by PCR NEGATIVE NEGATIVE Final    Comment: (NOTE) The Xpert Xpress SARS-CoV-2/FLU/RSV plus assay is intended as an aid in the diagnosis of influenza from Nasopharyngeal swab specimens and should not be used as a sole basis for treatment. Nasal washings and aspirates are unacceptable for Xpert Xpress SARS-CoV-2/FLU/RSV testing.  Fact Sheet for Patients: bloggercourse.com  Fact Sheet for  Healthcare Providers: seriousbroker.it  This test is not yet approved or cleared by the [place]  FDA and has been authorized for detection and/or diagnosis of SARS-CoV-2 by FDA under an Emergency Use Authorization (EUA). This EUA will remain in effect (meaning this test can be used) for the duration of the COVID-19 declaration under Section 564(b)(1) of the Act, 21 U.S.C. section 360bbb-3(b)(1), unless the authorization is terminated or revoked.     Resp Syncytial Virus by PCR NEGATIVE NEGATIVE Final    Comment: (NOTE) Fact Sheet for Patients: bloggercourse.com  Fact Sheet for  Healthcare Providers: seriousbroker.it  This test is not yet approved or cleared by the [place]  FDA and has been authorized for detection and/or diagnosis of SARS-CoV-2 by FDA under an Emergency Use Authorization (EUA). This EUA will remain in effect (meaning this test can be used) for the duration of the COVID-19 declaration under Section 564(b)(1) of the Act, 21 U.S.C. section 360bbb-3(b)(1), unless the authorization is terminated or revoked.  Performed at [facility], [address] [place], [place] [address]      Labs: BNP (last 3 results) Recent Labs    01/26/23 0554 12/26/23 1526  BNP 69.4 30.4   Basic Metabolic Panel: Recent Labs  Lab 12/26/23 1526 12/27/23 0509 12/28/23 0454  NA 141 140 138  K 4.8 4.0 3.8  CL 104 104 101  CO2 26 28 26   GLUCOSE 103* 102* 100*  BUN 40* 31* 20  CREATININE 2.50* 1.29* 1.10*  CALCIUM  9.0 8.7* 8.8*  MG  --   --  1.8  PHOS  --   --  2.9   Liver Function Tests: Recent Labs  Lab 12/26/23 1526  AST 21  ALT <5  ALKPHOS 79  BILITOT 1.0  PROT 7.3  ALBUMIN 3.8   No results for input(s): LIPASE, AMYLASE in the last 168 hours. No results for input(s): AMMONIA in the last 168 hours. CBC: Recent Labs  Lab 12/26/23 1526 12/27/23 0509  12/28/23 0454  WBC 9.7 7.0 8.3  NEUTROABS 5.6  --   --   HGB 12.1 11.8* 11.1*  HCT 39.2 38.4 35.2*  MCV 94.0 94.1 91.9  PLT 255 216 210   Cardiac Enzymes: No results for input(s): CKTOTAL, CKMB, CKMBINDEX, TROPONINI in the last 168 hours. BNP: Invalid input(s): POCBNP CBG: Recent Labs  Lab 12/27/23 1230 12/27/23 1732 12/27/23 2129 12/28/23 0808 12/28/23 1300  GLUCAP 90 92 96 104* 95   D-Dimer No results for input(s): DDIMER in the last 72 hours. Hgb A1c Recent Labs    12/26/23 2109  HGBA1C 5.7*   Lipid Profile No results for input(s): CHOL, HDL, LDLCALC, TRIG, CHOLHDL, LDLDIRECT in the last 72 hours. Thyroid  function studies No results for input(s): TSH, T4TOTAL, T3FREE, THYROIDAB in the last 72 hours.  Invalid input(s): FREET3 Anemia work up No results for input(s): VITAMINB12, FOLATE, FERRITIN, TIBC, IRON, RETICCTPCT in the last 72 hours. Urinalysis    Component Value Date/Time   COLORURINE YELLOW 12/26/2023 2101   APPEARANCEUR HAZY (A) 12/26/2023 2101   LABSPEC 1.016 12/26/2023 2101   PHURINE 5.0 12/26/2023 2101   GLUCOSEU NEGATIVE 12/26/2023 2101   HGBUR NEGATIVE 12/26/2023 2101   BILIRUBINUR NEGATIVE 12/26/2023 2101   KETONESUR NEGATIVE 12/26/2023 2101   PROTEINUR NEGATIVE 12/26/2023 2101   UROBILINOGEN 0.2 05/13/2015 1849   NITRITE POSITIVE (A) 12/26/2023 2101   LEUKOCYTESUR TRACE (A) 12/26/2023 2101   Sepsis Labs Recent Labs  Lab 12/26/23 1526 12/27/23 0509 12/28/23 0454  WBC 9.7 7.0 8.3   Microbiology Recent Results (from the past 240 hours)  Resp panel by RT-PCR (RSV, Flu A&B, Covid) Anterior Nasal Swab     Status: None   Collection Time: 12/26/23  3:59 PM   Specimen: Anterior Nasal Swab  Result Value Ref Range Status   SARS Coronavirus 2 by RT PCR NEGATIVE NEGATIVE Final    Comment: (NOTE) SARS-CoV-2 target nucleic acids are NOT DETECTED.  The SARS-CoV-2 RNA is generally detectable in  upper respiratory specimens during the acute phase of infection. The lowest concentration of SARS-CoV-2 viral  copies this assay can detect is 138 copies/mL. A negative result does not preclude SARS-Cov-2 infection and should not be used as the sole basis for treatment or other patient management decisions. A negative result may occur with  improper specimen collection/handling, submission of specimen other than nasopharyngeal swab, presence of viral mutation(s) within the areas targeted by this assay, and inadequate number of viral copies(<138 copies/mL). A negative result must be combined with clinical observations, patient history, and epidemiological information. The expected result is Negative.  Fact Sheet for Patients:  bloggercourse.com  Fact Sheet for Healthcare Providers:  seriousbroker.it  This test is no t yet approved or cleared by the [place]  FDA and  has been authorized for detection and/or diagnosis of SARS-CoV-2 by FDA under an Emergency Use Authorization (EUA). This EUA will remain  in effect (meaning this test can be used) for the duration of the COVID-19 declaration under Section 564(b)(1) of the Act, 21 U.S.C.section 360bbb-3(b)(1), unless the authorization is terminated  or revoked sooner.       Influenza A by PCR NEGATIVE NEGATIVE Final   Influenza B by PCR NEGATIVE NEGATIVE Final    Comment: (NOTE) The Xpert Xpress SARS-CoV-2/FLU/RSV plus assay is intended as an aid in the diagnosis of influenza from Nasopharyngeal swab specimens and should not be used as a sole basis for treatment. Nasal washings and aspirates are unacceptable for Xpert Xpress SARS-CoV-2/FLU/RSV testing.  Fact Sheet for Patients: bloggercourse.com  Fact Sheet for Healthcare Providers: seriousbroker.it  This test is not yet approved or cleared by the [place]  FDA and has been  authorized for detection and/or diagnosis of SARS-CoV-2 by FDA under an Emergency Use Authorization (EUA). This EUA will remain in effect (meaning this test can be used) for the duration of the COVID-19 declaration under Section 564(b)(1) of the Act, 21 U.S.C. section 360bbb-3(b)(1), unless the authorization is terminated or revoked.     Resp Syncytial Virus by PCR NEGATIVE NEGATIVE Final    Comment: (NOTE) Fact Sheet for Patients: bloggercourse.com  Fact Sheet for Healthcare Providers: seriousbroker.it  This test is not yet approved or cleared by the [place]  FDA and has been authorized for detection and/or diagnosis of SARS-CoV-2 by FDA under an Emergency Use Authorization (EUA). This EUA will remain in effect (meaning this test can be used) for the duration of the COVID-19 declaration under Section 564(b)(1) of the Act, 21 U.S.C. section 360bbb-3(b)(1), unless the authorization is terminated or revoked.  Performed at [facility], [address], [place], [place] [address]      Time coordinating discharge:  I have spent 35 minutes face to face with the patient and on the ward discussing the patients care, assessment, plan and disposition with other care givers. >50% of the time was devoted counseling the patient about the risks and benefits of treatment/Discharge disposition and coordinating care.   SIGNED:   [place]ess JAYSON Dare, MD  [facility] 12/28/2023, 3:03 PM   If 7PM-7AM, please contact night-coverage

## 2023-12-28 NOTE — TOC Progression Note (Signed)
 Transition of Care [facility]) - Progression Note    Patient Details  Name: [name] MRN: [id number] Date of Birth: [birth year]  Transition of Care [facility]) CM/SW Contact  [name], [place] Phone Number: 12/28/2023, 1:13 PM  Clinical Narrative:       Pt is from [facility]. PT recs are pending. TOC will follow.          Social Determinants of Health (SDOH) Interventions SDOH Screenings   Food Insecurity: Patient Unable To Answer (12/27/2023)  Housing: Patient Unable To Answer (12/27/2023)  Transportation Needs: Patient Unable To Answer (12/27/2023)  Utilities: Patient Unable To Answer (12/27/2023)  Social Connections: Unknown (12/27/2023)  Tobacco Use: Medium Risk (12/26/2023)    Readmission Risk Interventions    01/26/2023    3:00 PM 01/26/2023   11:35 AM  Readmission Risk Prevention Plan  Transportation Screening Complete Complete  PCP or Specialist Appt within 5-7 Days Complete Complete  Home Care Screening  Complete  Medication Review (RN CM)  Complete

## 2023-12-28 NOTE — Plan of Care (Signed)
  Problem: Education: Goal: Ability to describe self-care measures that may prevent or decrease complications (Diabetes Survival Skills Education) will improve Outcome: Adequate for Discharge Goal: Individualized Educational Video(s) Outcome: Adequate for Discharge   Problem: Coping: Goal: Ability to adjust to condition or change in health will improve Outcome: Adequate for Discharge   Problem: Fluid Volume: Goal: Ability to maintain a balanced intake and output will improve Outcome: Adequate for Discharge   Problem: Health Behavior/Discharge Planning: Goal: Ability to identify and utilize available resources and services will improve Outcome: Adequate for Discharge Goal: Ability to manage health-related needs will improve Outcome: Adequate for Discharge   Problem: Metabolic: Goal: Ability to maintain appropriate glucose levels will improve Outcome: Adequate for Discharge   Problem: Nutritional: Goal: Maintenance of adequate nutrition will improve Outcome: Adequate for Discharge Goal: Progress toward achieving an optimal weight will improve Outcome: Adequate for Discharge   Problem: Skin Integrity: Goal: Risk for impaired skin integrity will decrease Outcome: Adequate for Discharge   Problem: Tissue Perfusion: Goal: Adequacy of tissue perfusion will improve Outcome: Adequate for Discharge   Problem: Education: Goal: Knowledge of General Education information will improve Description: Including pain rating scale, medication(s)/side effects and non-pharmacologic comfort measures Outcome: Adequate for Discharge   Problem: Health Behavior/Discharge Planning: Goal: Ability to manage health-related needs will improve Outcome: Adequate for Discharge   Problem: Clinical Measurements: Goal: Ability to maintain clinical measurements within normal limits will improve Outcome: Adequate for Discharge Goal: Will remain free from infection Outcome: Adequate for Discharge Goal:  Diagnostic test results will improve Outcome: Adequate for Discharge Goal: Respiratory complications will improve Outcome: Adequate for Discharge Goal: Cardiovascular complication will be avoided Outcome: Adequate for Discharge   Problem: Activity: Goal: Risk for activity intolerance will decrease Outcome: Adequate for Discharge   Problem: Nutrition: Goal: Adequate nutrition will be maintained Outcome: Adequate for Discharge   Problem: Coping: Goal: Level of anxiety will decrease Outcome: Adequate for Discharge   Problem: Elimination: Goal: Will not experience complications related to bowel motility Outcome: Adequate for Discharge Goal: Will not experience complications related to urinary retention Outcome: Adequate for Discharge   Problem: Pain Managment: Goal: General experience of comfort will improve and/or be controlled Outcome: Adequate for Discharge

## 2023-12-28 NOTE — Progress Notes (Signed)
 PROGRESS NOTE    [name]  FMW:[id number] DOB: 10-28-1932 DOA: 12/26/2023 PCP: Loreli Kins, MD    Brief Narrative:  89 year old with history of dementia brought to the hospital from nursing home for loss of consciousness in her wheelchair.  No other history.  CT of the head is negative.  Also noted to have AKI with creatinine of 2.5 which improved with IV fluids. She was also found to have mild UTI therefore she was started on p.o. Keflex . Overall patient is doing better, awaiting PT/OT evaluation.  If facility is able to take her back today we will discharge her.  Assessment & Plan:  Principal Problem:   AKI (acute kidney injury) (HCC) Active Problems:   Syncope and collapse   Paroxysmal atrial fibrillation (HCC)   Acute kidney injury Syncope - Suspect secondary to dehydration.  Admission creatinine 2.5, this is trended down with IV fluids.  Baseline creatinine is around 1.0.  Holding diuretics at this time.  No longer needs to be on telemetry  Urinary tract infection - Patient having difficult time expressing symptoms.  In the setting of AKI, poor hydration and change in mental status will opt to treat this.  Previously on the cultures precinct have grown pansensitive Klebsiella.  Will start 3 days of p.o. Keflex , EOT 2/7  History of paroxysmal atrial fibrillation -Metoprolol  twice daily, Eliquis   Diabetes mellitus type 2 -Sliding scale and Accu-Chek.  CHF with preserved ejection fraction -Will need to hold Lasix given dehydration and poor oral intake.  Continue Lopressor  for  History of dementia -Namenda  and Zoloft   Hyperlipidemia - Statin  PT/OT-pending  DVT prophylaxis: Eliquis     Code Status: Do not attempt resuscitation (DNR) PRE-ARREST INTERVENTIONS DESIRED Family Communication:  called daughter.  Status is: Inpatient Remains inpatient appropriate because: Feels weak May be DC later today if she is able to ambulate as much as prior to admission.  PT  eval has been ordered   Subjective:  Still overall feeling weak.  Examination:  General exam: Appears calm and comfortable  Respiratory system: Clear to auscultation. Respiratory effort normal. Cardiovascular system: S1 & S2 heard, RRR. No JVD, murmurs, rubs, gallops or clicks. No pedal edema. Gastrointestinal system: Abdomen is nondistended, soft and nontender. No organomegaly or masses felt. Normal bowel sounds heard. Central nervous system: Alert and oriented. No focal neurological deficits. Extremities: Symmetric 5 x 5 power. Skin: No rashes, lesions or ulcers Psychiatry: Judgement and insight appear normal. Mood & affect appropriate.                 Diet Orders (From admission, onward)     Start     Ordered   12/26/23 2015  Diet Carb Modified Fluid consistency: Thin; Room service appropriate? Yes  Diet effective now       Question Answer Comment  Diet-HS Snack? Nothing   Calorie Level Medium 1600-2000   Fluid consistency: Thin   Room service appropriate? Yes      12/26/23 2014            Objective: Vitals:   12/27/23 2117 12/27/23 2302 12/28/23 0542 12/28/23 0949  BP: (!) 150/54 (!) 144/57 (!) 157/62   Pulse: 67 66 64 65  Resp: 18 18 18    Temp: 98.3 F (36.8 C) 98.5 F (36.9 C) 97.9 F (36.6 C)   TempSrc: Oral Oral Oral   SpO2: 93% 94% 91% 98%  Weight:      Height:        Intake/Output Summary (  Last 24 hours) at 12/28/2023 1237 Last data filed at 12/28/2023 0933 Gross per 24 hour  Intake 1344.48 ml  Output 650 ml  Net 694.48 ml   Filed Weights   12/26/23 1512  Weight: 68 kg    Scheduled Meds:  apixaban   2.5 mg Oral BID   cephALEXin   500 mg Oral Q8H   insulin  aspart  0-5 Units Subcutaneous QHS   insulin  aspart  0-9 Units Subcutaneous TID WC   memantine   10 mg Oral BID   metoprolol  tartrate  12.5 mg Oral BID   sertraline   100 mg Oral QHS   simvastatin   20 mg Oral QHS   sodium chloride  flush  3 mL Intravenous Q12H   Continuous  Infusions:  sodium chloride  50 mL/hr at 12/28/23 0932    Nutritional status     Body mass index is 27.42 kg/m.  Data Reviewed:   CBC: Recent Labs  Lab 12/26/23 1526 12/27/23 0509 12/28/23 0454  WBC 9.7 7.0 8.3  NEUTROABS 5.6  --   --   HGB 12.1 11.8* 11.1*  HCT 39.2 38.4 35.2*  MCV 94.0 94.1 91.9  PLT 255 216 210   Basic Metabolic Panel: Recent Labs  Lab 12/26/23 1526 12/27/23 0509 12/28/23 0454  NA 141 140 138  K 4.8 4.0 3.8  CL 104 104 101  CO2 26 28 26   GLUCOSE 103* 102* 100*  BUN 40* 31* 20  CREATININE 2.50* 1.29* 1.10*  CALCIUM  9.0 8.7* 8.8*  MG  --   --  1.8  PHOS  --   --  2.9   GFR: Estimated Creatinine Clearance: 31.4 mL/min (A) (by C-G formula based on SCr of 1.1 mg/dL (H)). Liver Function Tests: Recent Labs  Lab 12/26/23 1526  AST 21  ALT <5  ALKPHOS 79  BILITOT 1.0  PROT 7.3  ALBUMIN 3.8   No results for input(s): LIPASE, AMYLASE in the last 168 hours. No results for input(s): AMMONIA in the last 168 hours. Coagulation Profile: Recent Labs  Lab 12/27/23 0509  INR 1.2   Cardiac Enzymes: No results for input(s): CKTOTAL, CKMB, CKMBINDEX, TROPONINI in the last 168 hours. BNP (last 3 results) No results for input(s): PROBNP in the last 8760 hours. HbA1C: Recent Labs    12/26/23 2109  HGBA1C 5.7*   CBG: Recent Labs  Lab 12/27/23 0744 12/27/23 1230 12/27/23 1732 12/27/23 2129 12/28/23 0808  GLUCAP 88 90 92 96 104*   Lipid Profile: No results for input(s): CHOL, HDL, LDLCALC, TRIG, CHOLHDL, LDLDIRECT in the last 72 hours. Thyroid  Function Tests: No results for input(s): TSH, T4TOTAL, FREET4, T3FREE, THYROIDAB in the last 72 hours. Anemia Panel: No results for input(s): VITAMINB12, FOLATE, FERRITIN, TIBC, IRON, RETICCTPCT in the last 72 hours. Sepsis Labs: No results for input(s): PROCALCITON, LATICACIDVEN in the last 168 hours.  Recent Results (from the past 240  hours)  Resp panel by RT-PCR (RSV, Flu A&B, Covid) Anterior Nasal Swab     Status: None   Collection Time: 12/26/23  3:59 PM   Specimen: Anterior Nasal Swab  Result Value Ref Range Status   SARS Coronavirus 2 by RT PCR NEGATIVE NEGATIVE Final    Comment: (NOTE) SARS-CoV-2 target nucleic acids are NOT DETECTED.  The SARS-CoV-2 RNA is generally detectable in upper respiratory specimens during the acute phase of infection. The lowest concentration of SARS-CoV-2 viral copies this assay can detect is 138 copies/mL. A negative result does not preclude SARS-Cov-2 infection and should not be used as  the sole basis for treatment or other patient management decisions. A negative result may occur with  improper specimen collection/handling, submission of specimen other than nasopharyngeal swab, presence of viral mutation(s) within the areas targeted by this assay, and inadequate number of viral copies(<138 copies/mL). A negative result must be combined with clinical observations, patient history, and epidemiological information. The expected result is Negative.  Fact Sheet for Patients:  bloggercourse.com  Fact Sheet for Healthcare Providers:  seriousbroker.it  This test is no t yet approved or cleared by the [place]  FDA and  has been authorized for detection and/or diagnosis of SARS-CoV-2 by FDA under an Emergency Use Authorization (EUA). This EUA will remain  in effect (meaning this test can be used) for the duration of the COVID-19 declaration under Section 564(b)(1) of the Act, 21 U.S.C.section 360bbb-3(b)(1), unless the authorization is terminated  or revoked sooner.       Influenza A by PCR NEGATIVE NEGATIVE Final   Influenza B by PCR NEGATIVE NEGATIVE Final    Comment: (NOTE) The Xpert Xpress SARS-CoV-2/FLU/RSV plus assay is intended as an aid in the diagnosis of influenza from Nasopharyngeal swab specimens and should not  be used as a sole basis for treatment. Nasal washings and aspirates are unacceptable for Xpert Xpress SARS-CoV-2/FLU/RSV testing.  Fact Sheet for Patients: bloggercourse.com  Fact Sheet for Healthcare Providers: seriousbroker.it  This test is not yet approved or cleared by the [place]  FDA and has been authorized for detection and/or diagnosis of SARS-CoV-2 by FDA under an Emergency Use Authorization (EUA). This EUA will remain in effect (meaning this test can be used) for the duration of the COVID-19 declaration under Section 564(b)(1) of the Act, 21 U.S.C. section 360bbb-3(b)(1), unless the authorization is terminated or revoked.     Resp Syncytial Virus by PCR NEGATIVE NEGATIVE Final    Comment: (NOTE) Fact Sheet for Patients: bloggercourse.com  Fact Sheet for Healthcare Providers: seriousbroker.it  This test is not yet approved or cleared by the [place]  FDA and has been authorized for detection and/or diagnosis of SARS-CoV-2 by FDA under an Emergency Use Authorization (EUA). This EUA will remain in effect (meaning this test can be used) for the duration of the COVID-19 declaration under Section 564(b)(1) of the Act, 21 U.S.C. section 360bbb-3(b)(1), unless the authorization is terminated or revoked.  Performed at [facility], [address] [place], [place] [address]          Radiology Studies: CT Head Wo Contrast Result Date: 12/26/2023 CLINICAL DATA:  Memory loss, altered mental status EXAM: CT HEAD WITHOUT CONTRAST TECHNIQUE: Contiguous axial images were obtained from the base of the skull through the vertex without intravenous contrast. RADIATION DOSE REDUCTION: This exam was performed according to the departmental dose-optimization program which includes automated exposure control, adjustment of the mA and/or kV according to patient  size and/or use of iterative reconstruction technique. COMPARISON:  04/02/2023 FINDINGS: Brain: No evidence of acute infarction, hemorrhage, mass, mass effect, or midline shift. No hydrocephalus or extra-axial fluid collection. Unchanged advanced cerebral atrophy. Periventricular white matter changes, likely the sequela of chronic small vessel ischemic disease. Vascular: No hyperdense vessel. Skull: Negative for fracture or focal lesion. Sinuses/Orbits: Mucosal thickening in the ethmoid air cells. Other: The mastoid air cells are well aerated. IMPRESSION: No acute intracranial process. Electronically Signed   By: Donald Campion M.D.   On: 12/26/2023 17:10   DG Chest Port 1 View Result Date: 12/26/2023 CLINICAL DATA:  Shortness of breath EXAM:  PORTABLE CHEST 1 VIEW COMPARISON:  Chest radiograph dated 01/25/2023 FINDINGS: Patient is rotated to the right. Low lung volumes with bronchovascular crowding. No focal consolidations. No pleural effusion or pneumothorax. The heart size and mediastinal contours are within normal limits. No acute osseous abnormality. IMPRESSION: Low lung volumes with bronchovascular crowding. No focal consolidations. Electronically Signed   By: Limin  Xu M.D.   On: 12/26/2023 16:41           LOS: 2 days   Time spent= 35 mins    Burgess JAYSON Dare, MD [facility]  If 7PM-7AM, please contact night-coverage  12/28/2023, 12:37 PM

## 2023-12-28 NOTE — TOC Transition Note (Signed)
 Transition of Care [facility]) - Discharge Note   Patient Details  Name: [name] MRN: [id number] Date of Birth: 25-Dec-1931  [facility] CM/SW Contact:  [name], LCSW Phone Number: 12/28/2023, 3:48 PM   Clinical Narrative:     Pt stable for DC today. She is from [facility]. PT is recommending HH. CSW spoke with Katina at [facility] and confirmed pt can return. They will arrange HH. DC summary, fl2, and HH orders faxed to [phone]. CSW updated pt's daughter and confirmed she will transport by PTAR.   RN to call report to  [phone]. PTAR has been scheduled for next available.   Final next level of care: Assisted Living Barriers to Discharge: No Barriers Identified   Patient Goals and CMS Choice            Discharge Placement                       Discharge Plan and Services Additional resources added to the After Visit Summary for                                       Social Drivers of Health (SDOH) Interventions SDOH Screenings   Food Insecurity: Patient Unable To Answer (12/27/2023)  Housing: Patient Unable To Answer (12/27/2023)  Transportation Needs: Patient Unable To Answer (12/27/2023)  Utilities: Patient Unable To Answer (12/27/2023)  Social Connections: Unknown (12/27/2023)  Tobacco Use: Medium Risk (12/26/2023)     Readmission Risk Interventions    01/26/2023    3:00 PM 01/26/2023   11:35 AM  Readmission Risk Prevention Plan  Transportation Screening Complete Complete  PCP or Specialist Appt within 5-7 Days Complete Complete  Home Care Screening  Complete  Medication Review (RN CM)  Complete

## 2023-12-28 NOTE — Evaluation (Signed)
 Physical Therapy Evaluation Patient Details Name: [name] MRN: [id number] DOB: Jan 06, 1932 Today's Date: 12/28/2023  History of Present Illness  89 y.o. female admitted from [facility] with syncope and collapse. Dx: AKI, hypotension, dehydration. PMH: dementia, DM, afib, CHF, HTN.  Clinical Impression  Pt admitted with above diagnosis. Pt ambulated 6' x 2 with RW, distance limited by urinary incontinence. Assisted pt to bedside commode, then back to bed (no recliner in room, none available on unit), SpO2 96% on room air with activity. Pt is not able to provide prior level of functional due to dementia. Per chart she resides at [facility]. She will require assistance for ADLs and mobility upon acute DC. Pt currently with functional limitations due to the deficits listed below (see PT Problem List). Pt will benefit from acute skilled PT to increase their independence and safety with mobility to allow discharge.           If plan is discharge home, recommend the following: A little help with walking and/or transfers;A little help with bathing/dressing/bathroom;Assistance with cooking/housework;Assist for transportation;Help with stairs or ramp for entrance;Direct supervision/assist for medications management;Direct supervision/assist for financial management   Can travel by private vehicle        Equipment Recommendations None recommended by PT  Recommendations for Other Services       Functional Status Assessment Patient has had a recent decline in their functional status and demonstrates the ability to make significant improvements in function in a reasonable and predictable amount of time.     Precautions / Restrictions Precautions Precautions: Fall Precaution Comments: admitted with syncopal event Restrictions Weight Bearing Restrictions Per Provider Order: No      Mobility  Bed Mobility Overal bed mobility: Needs Assistance Bed Mobility: Supine to Sit, Sit to Supine      Supine to sit: Mod assist Sit to supine: Mod assist   General bed mobility comments: mod A to raise trunk, mod A for BLEs into bed    Transfers Overall transfer level: Needs assistance Equipment used: Rolling walker (2 wheels) Transfers: Sit to/from Stand Sit to Stand: Min assist           General transfer comment: Verbal and manual cues for safe hand placement, min A to power up    Ambulation/Gait Ambulation/Gait assistance: Min assist Gait Distance (Feet): 6 Feet x 2 Assistive device: Rolling walker (2 wheels) Gait Pattern/deviations: Step-through pattern, Decreased stride length, Shuffle, Trunk flexed Gait velocity: decr     General Gait Details: 6' x 2 with seated rest, pt ambulates significantly too far behind RW, did not respond to verbal cues to step closer to RW, distance limited by urinary incontinence, assisted pt to bedside commode, then back to bed (no recliner in room), SpO2 96% on room air with activity  Stairs            Wheelchair Mobility     Tilt Bed    Modified Rankin (Stroke Patients Only)       Balance Overall balance assessment: Needs assistance Sitting-balance support: Feet supported, No upper extremity supported Sitting balance-Leahy Scale: Fair     Standing balance support: Bilateral upper extremity supported, During functional activity, Reliant on assistive device for balance Standing balance-Leahy Scale: Poor                               Pertinent Vitals/Pain Pain Assessment Pain Assessment: No/denies pain    Home Living  Additional Comments: Per chart review, pt resides at [facility].    Prior Function Prior Level of Function : Patient poor historian/Family not available             Mobility Comments: pt stated she walks wherever I want to without a device, this seems unlikely given her current mobility status, noted h/o dementia. Per H&P she was found slumpted over  in her WC just prior to admission. ADLs Comments: unknown.     Extremity/Trunk Assessment   Upper Extremity Assessment Upper Extremity Assessment: Defer to OT evaluation    Lower Extremity Assessment Lower Extremity Assessment: Generalized weakness (B knee ext 4/5)    Cervical / Trunk Assessment Cervical / Trunk Assessment: Kyphotic  Communication   Communication Communication: Hearing impairment  Cognition Arousal: Alert Behavior During Therapy: WFL for tasks assessed/performed Overall Cognitive Status: No family/caregiver present to determine baseline cognitive functioning                                 General Comments: oriented to self, able to state birthdate and that she's in a hospital, not oriented to city she's in, noted h/o dementia, pt pleasantly confused, she referenced seeing a man in the room when there wasn't a man in the room        General Comments      Exercises     Assessment/Plan    PT Assessment Patient needs continued PT services  PT Problem List Decreased activity tolerance;Decreased balance;Decreased cognition;Decreased mobility;Decreased strength       PT Treatment Interventions Functional mobility training;Therapeutic activities;Therapeutic exercise;Patient/family education;Gait training    PT Goals (Current goals can be found in the Care Plan section)  Acute Rehab PT Goals PT Goal Formulation: Patient unable to participate in goal setting Time For Goal Achievement: 01/11/24 Potential to Achieve Goals: Fair    Frequency Min 1X/week     Co-evaluation               AM-PAC PT 6 Clicks Mobility  Outcome Measure Help needed turning from your back to your side while in a flat bed without using bedrails?: A Little Help needed moving from lying on your back to sitting on the side of a flat bed without using bedrails?: A Lot Help needed moving to and from a bed to a chair (including a wheelchair)?: A Little Help  needed standing up from a chair using your arms (e.g., wheelchair or bedside chair)?: A Little Help needed to walk in hospital room?: A Little Help needed climbing 3-5 steps with a railing? : Total 6 Click Score: 15    End of Session Equipment Utilized During Treatment: Gait belt Activity Tolerance: Patient limited by fatigue Patient left: in bed;with call bell/phone within reach;with bed alarm set Nurse Communication: Mobility status PT Visit Diagnosis: Unsteadiness on feet (R26.81);Difficulty in walking, not elsewhere classified (R26.2)    Time: 8674-8652 PT Time Calculation (min) (ACUTE ONLY): 22 min   Charges:   PT Evaluation $PT Eval Moderate Complexity: 1 Mod   PT General Charges $$ ACUTE PT VISIT: 1 Visit         Sylvan Delon Copp PT 12/28/2023  Acute Rehabilitation Services  Office [phone]

## 2024-02-11 ENCOUNTER — Emergency Department (HOSPITAL_COMMUNITY)

## 2024-02-11 ENCOUNTER — Encounter (HOSPITAL_COMMUNITY): Payer: Self-pay

## 2024-02-11 ENCOUNTER — Emergency Department (HOSPITAL_COMMUNITY)
Admission: EM | Admit: 2024-02-11 | Discharge: 2024-02-11 | Disposition: A | Discharge status: Skilled Nursing Facility | Attending: Emergency Medicine | Admitting: Emergency Medicine

## 2024-02-11 ENCOUNTER — Other Ambulatory Visit: Payer: Self-pay

## 2024-02-11 DIAGNOSIS — N183 Chronic kidney disease, stage 3 unspecified: Secondary | ICD-10-CM | POA: Diagnosis not present

## 2024-02-11 DIAGNOSIS — N3000 Acute cystitis without hematuria: Secondary | ICD-10-CM | POA: Diagnosis not present

## 2024-02-11 DIAGNOSIS — M25559 Pain in unspecified hip: Secondary | ICD-10-CM | POA: Insufficient documentation

## 2024-02-11 DIAGNOSIS — F0280 Dementia in other diseases classified elsewhere without behavioral disturbance: Secondary | ICD-10-CM | POA: Diagnosis not present

## 2024-02-11 DIAGNOSIS — G309 Alzheimer's disease, unspecified: Secondary | ICD-10-CM | POA: Insufficient documentation

## 2024-02-11 DIAGNOSIS — E1122 Type 2 diabetes mellitus with diabetic chronic kidney disease: Secondary | ICD-10-CM | POA: Insufficient documentation

## 2024-02-11 DIAGNOSIS — I129 Hypertensive chronic kidney disease with stage 1 through stage 4 chronic kidney disease, or unspecified chronic kidney disease: Secondary | ICD-10-CM | POA: Diagnosis not present

## 2024-02-11 LAB — COMPREHENSIVE METABOLIC PANEL
ALT: 15 U/L (ref 0–44)
AST: 18 U/L (ref 15–41)
Albumin: 3.2 g/dL — ABNORMAL LOW (ref 3.5–5.0)
Alkaline Phosphatase: 66 U/L (ref 38–126)
Anion gap: 13 (ref 5–15)
BUN: 13 mg/dL (ref 8–23)
CO2: 27 mmol/L (ref 22–32)
Calcium: 9.2 mg/dL (ref 8.9–10.3)
Chloride: 100 mmol/L (ref 98–111)
Creatinine, Ser: 1.03 mg/dL — ABNORMAL HIGH (ref 0.44–1.00)
GFR, Estimated: 52 mL/min — ABNORMAL LOW (ref >60)
Glucose, Bld: 97 mg/dL (ref 70–99)
Potassium: 3.5 mmol/L (ref 3.5–5.1)
Sodium: 140 mmol/L (ref 135–145)
Total Bilirubin: 1 mg/dL (ref 0.0–1.2)
Total Protein: 6.7 g/dL (ref 6.5–8.1)

## 2024-02-11 LAB — URINALYSIS, W/ REFLEX TO CULTURE (INFECTION SUSPECTED)
Bilirubin Urine: NEGATIVE
Glucose, UA: NEGATIVE mg/dL
Hgb urine dipstick: NEGATIVE
Ketones, ur: 15 mg/dL — AB
Leukocytes,Ua: NEGATIVE
Nitrite: POSITIVE — AB
Protein, ur: NEGATIVE mg/dL
Specific Gravity, Urine: 1.025 (ref 1.005–1.030)
pH: 6 (ref 5.0–8.0)

## 2024-02-11 LAB — RESP PANEL BY RT-PCR (RSV, FLU A&B, COVID)  RVPGX2
Influenza A by PCR: NEGATIVE
Influenza B by PCR: NEGATIVE
Resp Syncytial Virus by PCR: NEGATIVE
SARS Coronavirus 2 by RT PCR: NEGATIVE

## 2024-02-11 LAB — CBC
HCT: 42.1 % (ref 36.0–46.0)
Hemoglobin: 13.3 g/dL (ref 12.0–15.0)
MCH: 28.1 pg (ref 26.0–34.0)
MCHC: 31.6 g/dL (ref 30.0–36.0)
MCV: 88.8 fL (ref 80.0–100.0)
Platelets: 298 10*3/uL (ref 150–400)
RBC: 4.74 MIL/uL (ref 3.87–5.11)
RDW: 12.7 % (ref 11.5–15.5)
WBC: 9 10*3/uL (ref 4.0–10.5)
nRBC: 0 % (ref 0.0–0.2)

## 2024-02-11 LAB — LIPASE, BLOOD: Lipase: 39 U/L (ref 11–51)

## 2024-02-11 LAB — CBG MONITORING, ED: Glucose-Capillary: 83 mg/dL (ref 70–99)

## 2024-02-11 MED ORDER — CEPHALEXIN 500 MG PO CAPS: 500.0000 mg | ORAL_CAPSULE | Freq: Two times a day (BID) | ORAL | 0 refills | Status: DC

## 2024-02-11 MED ORDER — SODIUM CHLORIDE 0.9 % IV SOLN: 1.0000 g | Freq: Once | INTRAVENOUS | Status: DC

## 2024-02-11 MED ORDER — CEFTRIAXONE SODIUM 1 G IJ SOLR
1.0000 g | Freq: Once | INTRAMUSCULAR | Status: AC
  Administered 2024-02-11: 1 g via INTRAMUSCULAR
  Filled 2024-02-11: qty 10

## 2024-02-11 MED ORDER — STERILE WATER FOR INJECTION IJ SOLN
INTRAMUSCULAR | Status: AC
  Administered 2024-02-11: 10 mL
  Filled 2024-02-11: qty 10

## 2024-02-11 MED ORDER — ACETAMINOPHEN 500 MG PO TABS
1000.0000 mg | ORAL_TABLET | Freq: Once | ORAL | Status: AC
  Administered 2024-02-11: 1000 mg via ORAL
  Filled 2024-02-11: qty 2

## 2024-02-11 MED ORDER — CEPHALEXIN 500 MG PO CAPS
500.0000 mg | ORAL_CAPSULE | Freq: Two times a day (BID) | ORAL | 0 refills | Status: AC
Start: 2024-02-11 — End: 2024-02-18

## 2024-02-11 NOTE — ED Notes (Signed)
 Ptar called

## 2024-02-11 NOTE — ED Triage Notes (Signed)
 Pt BIB [facility] from [facility]. Pt presents with hip pain x 1 week. EMS report pt cries out in pain no matter where they palpate. EMS report pt has some confusion, but they have no documented hx of dementia, but facility told EMS she was at baseline. EMS report pt has unequal pupils with the R being 1 mm and the L at 3 mm. Pt and facility denies recent falls. Pt is on anticoagulants.  Presented with 2 DNR forms and a MOST form.  EMS Vitals  140/82 HR 70 SpO2 96% on R/A CBG 116.

## 2024-02-11 NOTE — Discharge Instructions (Signed)
 You were seen in the emergency dept today with hip pain.  The x-rays did not show any broken bones or obvious injury.  It does appear that you are suffering from a urinary tract infection.  We have given antibiotics here in the emergency department and have called in some antibiotics to your pharmacy to continue for the next week.  We will send this urine for culture and call if you need to change antibiotics.

## 2024-02-11 NOTE — ED Provider Notes (Signed)
 Emergency Department Provider Note   I have reviewed the triage vital signs and the nursing notes.   HISTORY  Chief Complaint No chief complaint on file.   HPI [name] is a 89 y.o. female past history of CKD, Alzheimer's, hypertension presents to the emergency department with hip pain.  Patient is a resident at [facility] and apparently is been complaining of hip pain for the past week.  No known falls.  She is apparently wheelchair-bound at baseline. Patient with documented history of dementia and arrives with DNR forms at bedside. Patient apparently at mental status baseline per facility. Level 5 caveat: Dementia   Past Medical History:  Diagnosis Date   Allergies    Alzheimer disease (HCC)    STABLE   Anemia, iron deficiency 12/12/2011   Arthritis    RF   CKD (chronic kidney disease)    STAGE 3   Diabetes mellitus    no mes, diet only    Diarrhea    RESOLVED   Hypercholesterolemia    Hyperlipemia    Hypertension    Major depression    OA (osteoarthritis)    HAND/LEFT KNEE PAIN MILD   Osteoporosis    Stroke [facility])    Syncope     Review of Systems  Level 5 caveat: Dementia  ____________________________________________   PHYSICAL EXAM:  VITAL SIGNS: ED Triage Vitals  Encounter Vitals Group     BP 02/11/24 1222 (!) 163/63     Pulse Rate 02/11/24 1222 70     Resp 02/11/24 1222 20     Temp 02/11/24 1222 98.1 F (36.7 C)     Temp Source 02/11/24 1222 Oral     SpO2 02/11/24 1222 95 %     Weight 02/11/24 1223 149 lb 14.6 oz (68 kg)     Height 02/11/24 1223 5\' 2"  (1.575 m)   Constitutional: Alert. Well appearing and in no acute distress. Eyes: Conjunctivae are normal.  Head: Atraumatic. Nose: No congestion/rhinnorhea. Mouth/Throat: Mucous membranes are moist.   Neck: No stridor.   Cardiovascular: Normal rate, regular rhythm. Good peripheral circulation. Grossly normal heart sounds.   Respiratory: Normal respiratory effort.  No retractions.  Lungs CTAB. Gastrointestinal: Soft and nontender. No distention.  Musculoskeletal: No lower extremity tenderness nor edema. No gross deformities of extremities. Patient can independently lift both legs several inches off the bed without pain.  Neurologic:  Normal speech and language. No gross focal neurologic deficits are appreciated.  Skin:  Skin is warm, dry and intact. No rash noted.  ____________________________________________   LABS (all labs ordered are listed, but only abnormal results are displayed)  Labs Reviewed  RESP PANEL BY RT-PCR (RSV, FLU A&B, COVID)  [id number]  COMPREHENSIVE METABOLIC PANEL  CBC  LIPASE, BLOOD  URINALYSIS, W/ REFLEX TO CULTURE (INFECTION SUSPECTED)  CBG MONITORING, ED   ____________________________________________  EKG  *** ____________________________________________  RADIOLOGY  No results found.  ____________________________________________   PROCEDURES  Procedure(s) performed:   Procedures   ____________________________________________   INITIAL IMPRESSION / ASSESSMENT AND PLAN / ED COURSE  Pertinent labs & imaging results that were available during my care of the patient were reviewed by me and considered in my medical decision making (see chart for details).   This patient is Presenting for Evaluation of hip pain, which does require a range of treatment options, and is a complaint that involves a high risk of morbidity and mortality.  The Differential Diagnoses include MSK strain, fracture, dislocation, UTI, AKI, etc.  Critical Interventions-    Medications  acetaminophen (TYLENOL) tablet 1,000 mg (has no administration in time range)    Reassessment after intervention:     I did obtain Additional Historical Information from EMS.   I decided to review pertinent External Data, and in summary patient with brief admit in February of this year for UTI and AKI.    Clinical Laboratory Tests Ordered, included  ***  Radiologic Tests Ordered, included CXR, Pelvis XR, and CT head. I independently interpreted the images and agree with radiology interpretation.   Cardiac Monitor Tracing which shows NSR.   Social Determinants of Health Risk patient is a non-smoker.   Consult complete with  Medical Decision Making: Summary:  Patient arrives from the nursing facility with complaint of hip pain for the past week.  Patient is unable to give significant detail regarding this given her dementia.  Plan for screening x-rays, CT head, labs, UA.  Has had recent admit for AKI and UTI.  Question if pain from a UTI may be expressed as hip pain.  Low suspicion overall for fracture or dislocation the patient is able to follow commands and lift her legs off of the bed several inches bilaterally without apparent discomfort.  Reevaluation with update and discussion with   ***Considered admission***  Patient's presentation is most consistent with acute presentation with potential threat to life or bodily function.   Disposition:   ____________________________________________  FINAL CLINICAL IMPRESSION(S) / ED DIAGNOSES  Final diagnoses:  None     NEW OUTPATIENT MEDICATIONS STARTED DURING THIS VISIT:  New Prescriptions   No medications on file    Note:  This document was prepared using Dragon voice recognition software and may include unintentional dictation errors.  Alona Bene, MD, [facility]

## 2024-02-13 ENCOUNTER — Encounter (HOSPITAL_COMMUNITY): Payer: Self-pay | Admitting: Emergency Medicine

## 2024-02-13 ENCOUNTER — Emergency Department (HOSPITAL_COMMUNITY)

## 2024-02-13 ENCOUNTER — Other Ambulatory Visit: Payer: Self-pay

## 2024-02-13 ENCOUNTER — Emergency Department (HOSPITAL_COMMUNITY)
Admission: EM | Admit: 2024-02-13 | Discharge: 2024-02-13 | Disposition: A | Discharge status: Skilled Nursing Facility | Attending: Emergency Medicine | Admitting: Emergency Medicine

## 2024-02-13 DIAGNOSIS — Z79899 Other long term (current) drug therapy: Secondary | ICD-10-CM | POA: Insufficient documentation

## 2024-02-13 DIAGNOSIS — Z043 Encounter for examination and observation following other accident: Secondary | ICD-10-CM | POA: Diagnosis present

## 2024-02-13 DIAGNOSIS — W19XXXA Unspecified fall, initial encounter: Secondary | ICD-10-CM

## 2024-02-13 DIAGNOSIS — Z7901 Long term (current) use of anticoagulants: Secondary | ICD-10-CM | POA: Insufficient documentation

## 2024-02-13 DIAGNOSIS — F0390 Unspecified dementia without behavioral disturbance: Secondary | ICD-10-CM | POA: Diagnosis not present

## 2024-02-13 DIAGNOSIS — W050XXA Fall from non-moving wheelchair, initial encounter: Secondary | ICD-10-CM | POA: Diagnosis not present

## 2024-02-13 DIAGNOSIS — R410 Disorientation, unspecified: Secondary | ICD-10-CM | POA: Diagnosis not present

## 2024-02-13 DIAGNOSIS — Z91040 Latex allergy status: Secondary | ICD-10-CM | POA: Diagnosis not present

## 2024-02-13 LAB — CBC WITH DIFFERENTIAL/PLATELET
Abs Immature Granulocytes: 0.12 10*3/uL — ABNORMAL HIGH (ref 0.00–0.07)
Basophils Absolute: 0.1 10*3/uL (ref 0.0–0.1)
Basophils Relative: 1 %
Eosinophils Absolute: 0.1 10*3/uL (ref 0.0–0.5)
Eosinophils Relative: 1 %
HCT: 49.1 % — ABNORMAL HIGH (ref 36.0–46.0)
Hemoglobin: 15.4 g/dL — ABNORMAL HIGH (ref 12.0–15.0)
Immature Granulocytes: 1 %
Lymphocytes Relative: 21 %
Lymphs Abs: 2.2 10*3/uL (ref 0.7–4.0)
MCH: 28 pg (ref 26.0–34.0)
MCHC: 31.4 g/dL (ref 30.0–36.0)
MCV: 89.3 fL (ref 80.0–100.0)
Monocytes Absolute: 1.1 10*3/uL — ABNORMAL HIGH (ref 0.1–1.0)
Monocytes Relative: 10 %
Neutro Abs: 7.2 10*3/uL (ref 1.7–7.7)
Neutrophils Relative %: 66 %
Platelets: 316 10*3/uL (ref 150–400)
RBC: 5.5 MIL/uL — ABNORMAL HIGH (ref 3.87–5.11)
RDW: 12.9 % (ref 11.5–15.5)
WBC: 10.7 10*3/uL — ABNORMAL HIGH (ref 4.0–10.5)
nRBC: 0 % (ref 0.0–0.2)

## 2024-02-13 LAB — BASIC METABOLIC PANEL
Anion gap: 17 — ABNORMAL HIGH (ref 5–15)
BUN: 32 mg/dL — ABNORMAL HIGH (ref 8–23)
CO2: 17 mmol/L — ABNORMAL LOW (ref 22–32)
Calcium: 9.4 mg/dL (ref 8.9–10.3)
Chloride: 102 mmol/L (ref 98–111)
Creatinine, Ser: 1.69 mg/dL — ABNORMAL HIGH (ref 0.44–1.00)
GFR, Estimated: 29 mL/min — ABNORMAL LOW (ref >60)
Glucose, Bld: 111 mg/dL — ABNORMAL HIGH (ref 70–99)
Potassium: 4.2 mmol/L (ref 3.5–5.1)
Sodium: 136 mmol/L (ref 135–145)

## 2024-02-13 NOTE — Progress Notes (Signed)
 Orthopedic Tech Progress Note Patient Details:  [name] 10-Jan-1932 [id number]  Level 2 trauma  Patient ID: [name], female   DOB: May 17, 1932, 89 y.o.   MRN: [id number]  [name] 02/13/2024, 2:18 PM

## 2024-02-13 NOTE — ED Provider Notes (Signed)
 [place] EMERGENCY DEPARTMENT AT [facility] Provider Note   CSN: [id number] Arrival date & time: 02/13/24  1227     History  Chief Complaint  Patient presents with   Arlana Pouch Scharf is a 89 y.o. female.  89 year old female with history of dementia on Eliquis who present for nursing home after having unwitnessed fall.  Patient had been in her wheelchair and was found on the ground.  The ground was carpeted.  Unknown LOC.  At baseline according to EMS her GCS is 13.  When EMS arrived there, blood sugar was appropriate.  Patient had complained of whole body pain when he moved her in any direction according to EMS.  She has had periods of being calm when she is not bothered.  No obvious deformities noted per EMS.  Does have history of chronic hip pain.  C-collar placed by EMS       Home Medications Prior to Admission medications   Medication Sig Start Date End Date Taking? Authorizing Provider  acetaminophen (TYLENOL) 325 MG tablet Take 650 mg by mouth every 6 (six) hours as needed (for pain or fever).    [provider]  acetaminophen (TYLENOL) 500 MG tablet Take 1,000 mg by mouth in the morning.    [provider]  albuterol (VENTOLIN HFA) 108 (90 Base) MCG/ACT inhaler Inhale 2 puffs into the lungs every 4 (four) hours as needed for wheezing or shortness of breath.    [provider]  apixaban (ELIQUIS) 2.5 MG TABS tablet Take 1 tablet (2.5 mg total) by mouth 2 (two) times daily. 08/16/21   Azucena Fallen, MD  benzonatate (TESSALON) 200 MG capsule Take 1 capsule (200 mg total) by mouth 3 (three) times daily. Patient taking differently: Take 200 mg by mouth in the morning and at bedtime. 02/06/23   Rhetta Mura, MD  cephALEXin (KEFLEX) 500 MG capsule Take 1 capsule (500 mg total) by mouth 2 (two) times daily for 7 days. 02/11/24 02/18/24  LongArlyss Repress, MD  cyanocobalamin 1000 MCG tablet Take 1,000 mcg by mouth daily.     [provider]  feeding supplement (ENSURE ENLIVE / ENSURE PLUS) LIQD Take 237 mLs by mouth 2 (two) times daily between meals. Patient not taking: Reported on 12/26/2023 09/30/21   Lorin Glass, MD  furosemide (LASIX) 20 MG tablet Take 20 mg by mouth See admin instructions. Take 20 mg by mouth in the morning on Monday(s) only    [provider]  linagliptin (TRADJENTA) 5 MG TABS tablet Take 1 tablet (5 mg total) by mouth daily. Patient not taking: Reported on 12/26/2023 02/06/23   Rhetta Mura, MD  Melatonin 3 MG TABS Take 3 mg by mouth at bedtime.    [provider]  memantine (NAMENDA) 10 MG tablet Take 1 tablet (10 mg total) by mouth 2 (two) times daily. 02/06/23   Rhetta Mura, MD  metoprolol tartrate (LOPRESSOR) 25 MG tablet Take 0.5 tablets (12.5 mg total) by mouth 2 (two) times daily. 08/16/21   Azucena Fallen, MD  Nutritional Supplements (ENSURE ORIGINAL) LIQD Take 237 mLs by mouth See admin instructions. Chocolate flavor: Drink 237 ml's by mouth 2 times a day between meals    [provider]  nystatin powder Apply 1 Application topically See admin instructions. Apply to the the bilateral groin/thigh area in the morning and at bedtime    [provider]  sertraline (ZOLOFT) 100 MG tablet Take 1 tablet (100  mg total) by mouth at bedtime. 02/06/23   Rhetta Mura, MD  simvastatin (ZOCOR) 20 MG tablet Take 1 tablet (20 mg total) by mouth daily at 6 PM. Patient taking differently: Take 20 mg by mouth at bedtime. 08/19/21   Azucena Fallen, MD      Allergies    Sulfa antibiotics and Latex    Review of Systems   Review of Systems  All other systems reviewed and are negative.   Physical Exam Updated Vital Signs There were no vitals taken for this visit. Physical Exam Vitals and nursing note reviewed.  Constitutional:      General: She is not in acute distress.    Appearance: Normal appearance. She is  well-developed. She is not toxic-appearing.  HENT:     Head: Normocephalic and atraumatic.  Eyes:     General: Lids are normal.     Conjunctiva/sclera: Conjunctivae normal.     Pupils: Pupils are equal, round, and reactive to light.  Neck:     Thyroid: No thyroid mass.     Trachea: No tracheal deviation.  Cardiovascular:     Rate and Rhythm: Normal rate and regular rhythm.     Heart sounds: Normal heart sounds. No murmur heard.    No gallop.  Pulmonary:     Effort: Pulmonary effort is normal. No respiratory distress.     Breath sounds: Normal breath sounds. No stridor. No decreased breath sounds, wheezing, rhonchi or rales.  Abdominal:     General: There is no distension.     Palpations: Abdomen is soft.     Tenderness: There is no abdominal tenderness. There is no rebound.  Musculoskeletal:        General: No tenderness. Normal range of motion.     Cervical back: Normal range of motion and neck supple.     Comments: No gross deformities noted to any of his extremities.  No shortening or rotation noted.  Skin:    General: Skin is warm and dry.     Findings: No abrasion or rash.  Neurological:     General: No focal deficit present.     Mental Status: She is alert. Mental status is at baseline. She is disoriented and confused.     GCS: GCS eye subscore is 4. GCS verbal subscore is 5. GCS motor subscore is 6.     Cranial Nerves: No cranial nerve deficit.     Sensory: No sensory deficit.     Motor: Motor function is intact.  Psychiatric:        Attention and Perception: She is inattentive.        Speech: Speech is delayed.     ED Results / Procedures / Treatments   Labs (all labs ordered are listed, but only abnormal results are displayed) Labs Reviewed  CBC WITH DIFFERENTIAL/PLATELET  BASIC METABOLIC PANEL    EKG EKG Interpretation Date/Time:  Tuesday February 13 2024 12:39:26 EDT Ventricular Rate:  66 PR Interval:  161 QRS Duration:  97 QT Interval:  550 QTC  Calculation: 577 R Axis:   22  Text Interpretation: Sinus rhythm Abnormal T, consider ischemia, diffuse leads Prolonged QT interval No significant change since last tracing Confirmed by Lorre Nick ([id number]) on 02/13/2024 12:42:09 PM  Radiology CT Head Wo Contrast Result Date: 02/11/2024 CLINICAL DATA:  Altered mental status. EXAM: CT HEAD WITHOUT CONTRAST TECHNIQUE: Contiguous axial images were obtained from the base of the skull through the vertex without intravenous contrast. RADIATION DOSE REDUCTION: This  exam was performed according to the departmental dose-optimization program which includes automated exposure control, adjustment of the mA and/or kV according to patient size and/or use of iterative reconstruction technique. COMPARISON:  12/26/2023 FINDINGS: Brain: No evidence of intracranial hemorrhage, acute infarction, hydrocephalus, extra-axial collection, or mass lesion/mass effect. 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 Moderate diffuse cerebral atrophy and chronic small vessel disease again noted. Vascular:  No hyperdense vessel or other acute findings. Skull: No evidence of fracture or other significant bone abnormality. Sinuses/Orbits:  No acute findings. Other: None. IMPRESSION: No acute intracranial abnormality. 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 Moderate cerebral atrophy and chronic small vessel disease. Electronically Signed   By: Danae Orleans M.D.   On: 02/11/2024 14:25   DG Chest Portable 1 View Result Date: 02/11/2024 CLINICAL DATA:  Pain. EXAM: PORTABLE CHEST 1 VIEW COMPARISON:  12/26/2023 and CT chest 08/09/2021. FINDINGS: Trachea is midline. Heart size is accentuated by AP semi upright technique. Lungs are somewhat low in volume with streaky scarring in the lingula. Probable additional scarring in the left costophrenic angle. No airspace consolidation. IMPRESSION: No acute findings. Electronically Signed   By: Leanna Battles M.D.   On: 02/11/2024 13:26   DG Pelvis Portable Result  Date: 02/11/2024 CLINICAL DATA:  Hip pain. EXAM: PORTABLE PELVIS 1-2 VIEWS COMPARISON:  08/06/2022. FINDINGS: Intramedullary rod with 2 screws in the right femoral neck across a healed intertrochanteric fracture. Old right superior and inferior pubic rami fractures. No definite acute fracture. Mild degenerative changes in the hips. Sacroiliac joints are patent. IMPRESSION: No acute findings. Electronically Signed   By: Leanna Battles M.D.   On: 02/11/2024 13:26    Procedures Procedures    Medications Ordered in ED Medications - No data to display  ED Course/ Medical Decision Making/ A&P                                 Medical Decision Making Amount and/or Complexity of Data Reviewed Labs: ordered. Radiology: ordered. ECG/medicine tests: ordered.   Patient's EKG shows normal sinus rhythm.  No change from prior studies.  Patient had a CT head and cervical spine which showed no acute findings per my review interpretation.  Chest and pelvis x-rays also without acute findings.  Labs are reassuring.  She does have chronic kidney disease.  Patient has history of dementia and appears to be at her baseline.  Will discharge back to her facility        Final Clinical Impression(s) / ED Diagnoses Final diagnoses:  None    Rx / DC Orders ED Discharge Orders     None         Lorre Nick, MD 02/13/24 1527

## 2024-02-13 NOTE — ED Triage Notes (Signed)
 Pt BIBA from SNF [facility]) w/ a unwitnessed fall from pts wheelchair. EMS found her recumbent position on R side. Pt is on eliquist and has a baseline GCS of 13. CBG 133. Pt is c/o R shoulder pain and pain all over. A&O x1 (self). In c-collar and screaming in pain. Airway patent and no visible signs of trauma. Hx: dementia and a-fib

## 2024-02-13 NOTE — Care Management (Signed)
 Contacted [facility] to inquiry about mode of transport. Was informed that patient is transported by [facility]. Called [facility] patient placed on list for transport. Updated RN on Yellow.

## 2024-03-01 ENCOUNTER — Encounter (HOSPITAL_COMMUNITY): Payer: Self-pay | Admitting: *Deleted

## 2024-03-01 ENCOUNTER — Emergency Department (HOSPITAL_COMMUNITY)
Admission: EM | Admit: 2024-03-01 | Discharge: 2024-03-01 | Disposition: A | Discharge status: Home / Self Care | Attending: Emergency Medicine | Admitting: Emergency Medicine

## 2024-03-01 ENCOUNTER — Other Ambulatory Visit: Payer: Self-pay

## 2024-03-01 ENCOUNTER — Emergency Department (HOSPITAL_COMMUNITY)

## 2024-03-01 DIAGNOSIS — S6991XA Unspecified injury of right wrist, hand and finger(s), initial encounter: Secondary | ICD-10-CM | POA: Diagnosis present

## 2024-03-01 DIAGNOSIS — S0083XA Contusion of other part of head, initial encounter: Secondary | ICD-10-CM | POA: Diagnosis not present

## 2024-03-01 DIAGNOSIS — Z91040 Latex allergy status: Secondary | ICD-10-CM | POA: Diagnosis not present

## 2024-03-01 DIAGNOSIS — W06XXXA Fall from bed, initial encounter: Secondary | ICD-10-CM | POA: Diagnosis not present

## 2024-03-01 DIAGNOSIS — Z7901 Long term (current) use of anticoagulants: Secondary | ICD-10-CM | POA: Diagnosis not present

## 2024-03-01 DIAGNOSIS — Z79899 Other long term (current) drug therapy: Secondary | ICD-10-CM | POA: Insufficient documentation

## 2024-03-01 DIAGNOSIS — Z23 Encounter for immunization: Secondary | ICD-10-CM | POA: Insufficient documentation

## 2024-03-01 DIAGNOSIS — S62646A Nondisplaced fracture of proximal phalanx of right little finger, initial encounter for closed fracture: Secondary | ICD-10-CM | POA: Diagnosis not present

## 2024-03-01 DIAGNOSIS — I509 Heart failure, unspecified: Secondary | ICD-10-CM | POA: Diagnosis not present

## 2024-03-01 DIAGNOSIS — F0390 Unspecified dementia without behavioral disturbance: Secondary | ICD-10-CM | POA: Diagnosis not present

## 2024-03-01 DIAGNOSIS — I4891 Unspecified atrial fibrillation: Secondary | ICD-10-CM | POA: Diagnosis not present

## 2024-03-01 DIAGNOSIS — M25562 Pain in left knee: Secondary | ICD-10-CM | POA: Diagnosis not present

## 2024-03-01 DIAGNOSIS — S0990XA Unspecified injury of head, initial encounter: Secondary | ICD-10-CM

## 2024-03-01 LAB — COMPREHENSIVE METABOLIC PANEL WITH GFR
ALT: 10 U/L (ref 0–44)
AST: 19 U/L (ref 15–41)
Albumin: 3.2 g/dL — ABNORMAL LOW (ref 3.5–5.0)
Alkaline Phosphatase: 86 U/L (ref 38–126)
Anion gap: 8 (ref 5–15)
BUN: 17 mg/dL (ref 8–23)
CO2: 25 mmol/L (ref 22–32)
Calcium: 9 mg/dL (ref 8.9–10.3)
Chloride: 106 mmol/L (ref 98–111)
Creatinine, Ser: 1.04 mg/dL — ABNORMAL HIGH (ref 0.44–1.00)
GFR, Estimated: 51 mL/min — ABNORMAL LOW (ref >60)
Glucose, Bld: 95 mg/dL (ref 70–99)
Potassium: 3.9 mmol/L (ref 3.5–5.1)
Sodium: 139 mmol/L (ref 135–145)
Total Bilirubin: 0.6 mg/dL (ref 0.0–1.2)
Total Protein: 6.4 g/dL — ABNORMAL LOW (ref 6.5–8.1)

## 2024-03-01 LAB — SAMPLE TO BLOOD BANK

## 2024-03-01 LAB — CBC
HCT: 40 % (ref 36.0–46.0)
Hemoglobin: 12.2 g/dL (ref 12.0–15.0)
MCH: 28 pg (ref 26.0–34.0)
MCHC: 30.5 g/dL (ref 30.0–36.0)
MCV: 91.7 fL (ref 80.0–100.0)
Platelets: 227 10*3/uL (ref 150–400)
RBC: 4.36 MIL/uL (ref 3.87–5.11)
RDW: 13.1 % (ref 11.5–15.5)
WBC: 7.8 10*3/uL (ref 4.0–10.5)
nRBC: 0 % (ref 0.0–0.2)

## 2024-03-01 LAB — CK: Total CK: 48 U/L (ref 38–234)

## 2024-03-01 MED ORDER — CEFAZOLIN SODIUM-DEXTROSE 2-4 GM/100ML-% IV SOLN
2.0000 g | INTRAVENOUS | Status: AC
  Administered 2024-03-01: 2 g via INTRAVENOUS

## 2024-03-01 MED ORDER — CEPHALEXIN 500 MG PO CAPS
500.0000 mg | ORAL_CAPSULE | Freq: Two times a day (BID) | ORAL | 0 refills | Status: AC
Start: 2024-03-01 — End: 2024-03-08

## 2024-03-01 MED ORDER — FENTANYL CITRATE PF 50 MCG/ML IJ SOSY
25.0000 ug | PREFILLED_SYRINGE | Freq: Once | INTRAMUSCULAR | Status: AC
  Administered 2024-03-01: 25 ug via INTRAVENOUS
  Filled 2024-03-01: qty 1

## 2024-03-01 MED ORDER — TETANUS-DIPHTH-ACELL PERTUSSIS 5-2.5-18.5 LF-MCG/0.5 IM SUSY
0.5000 mL | PREFILLED_SYRINGE | Freq: Once | INTRAMUSCULAR | Status: AC
  Administered 2024-03-01: 0.5 mL via INTRAMUSCULAR
  Filled 2024-03-01: qty 0.5

## 2024-03-01 NOTE — Progress Notes (Signed)
 Orthopedic Tech Progress Note Patient Details:  [name] Christine 1931-11-24 [id number]  Ortho Devices Type of Ortho Device: Ulna gutter splint Ortho Device/Splint Location: RUE Ortho Device/Splint Interventions: Ordered, Application   Post Interventions Patient Tolerated: Sallye Ober 03/01/2024, 10:13 AM

## 2024-03-01 NOTE — Progress Notes (Signed)
 Orthopedic Tech Progress Note Patient Details:  [name] 1932/01/16 [id number]  Ortho Devices Type of Ortho Device: Buddy tape Ortho Device/Splint Location: 5th,4th digits Ortho Device/Splint Interventions: Ordered, Application   Post Interventions Patient Tolerated: Well  Marchia Diguglielmo A [name] 03/01/2024, 9:09 AM

## 2024-03-01 NOTE — ED Provider Notes (Signed)
  EMERGENCY DEPARTMENT AT [facility] Provider Note   CSN: [id number] Arrival date & time: 03/01/24  0715     History  No chief complaint on file.   [name] is a 89 y.o. female.  89 year old female with history of Alzheimer dementia, CHF, and atrial fibrillation on Eliquis who presents emergency department after a fall.  Patient was at her facility reportedly fell out of bed.  Was unwitnessed.  Unclear how long she was on the ground for.  Has a hematoma to her left forehead.  Also complaining of left knee pain.  Also has a small laceration on her right small finger.  Last took Eliquis at 8 PM last night.       Home Medications Prior to Admission medications   Medication Sig Start Date End Date Taking? Authorizing Provider  acetaminophen (TYLENOL) 325 MG tablet Take 650 mg by mouth every 6 (six) hours as needed (for pain or fever).   Yes [provider]  acetaminophen (TYLENOL) 500 MG tablet Take 1,000 mg by mouth in the morning.   Yes [provider]  albuterol (VENTOLIN HFA) 108 (90 Base) MCG/ACT inhaler Inhale 2 puffs into the lungs every 4 (four) hours as needed for wheezing or shortness of breath.   Yes [provider]  apixaban (ELIQUIS) 2.5 MG TABS tablet Take 1 tablet (2.5 mg total) by mouth 2 (two) times daily. 08/16/21  Yes Azucena Fallen, MD  benzonatate (TESSALON) 200 MG capsule Take 1 capsule (200 mg total) by mouth 3 (three) times daily. Patient taking differently: Take 200 mg by mouth in the morning and at bedtime. 02/06/23  Yes Rhetta Mura, MD  BIOFREEZE ROLL-ON 4 % GEL Apply 1 application  topically 3 (three) times daily. 02/20/24  Yes [provider]  cephALEXin (KEFLEX) 500 MG capsule Take 1 capsule (500 mg total) by mouth 2 (two) times daily for 7 days. 03/01/24 03/08/24 Yes Rondel Baton, MD  cyanocobalamin 1000 MCG tablet Take 1,000 mcg by mouth daily.   Yes [provider]   feeding supplement (ENSURE ENLIVE / ENSURE PLUS) LIQD Take 237 mLs by mouth 2 (two) times daily between meals. 09/30/21  Yes Dahal, Melina Schools, MD  furosemide (LASIX) 20 MG tablet Take 20 mg by mouth once a week. Mondays   Yes [provider]  lidocaine 4 % Place 1 patch onto the skin daily. 02/20/24  Yes [provider]  Melatonin 3 MG TABS Take 3 mg by mouth at bedtime.   Yes [provider]  memantine (NAMENDA) 10 MG tablet Take 1 tablet (10 mg total) by mouth 2 (two) times daily. 02/06/23  Yes Rhetta Mura, MD  metoprolol tartrate (LOPRESSOR) 25 MG tablet Take 0.5 tablets (12.5 mg total) by mouth 2 (two) times daily. 08/16/21  Yes Azucena Fallen, MD  [facility] 17 GM/SCOOP powder Take 17 g by mouth daily as needed for mild constipation or moderate constipation. 02/20/24  Yes [provider]  nystatin powder Apply 1 Application topically See admin instructions. Apply to the the bilateral groin/thigh area in the morning and at bedtime   Yes [provider]  sertraline (ZOLOFT) 100 MG tablet Take 1 tablet (100 mg total) by mouth at bedtime. 02/06/23  Yes Rhetta Mura, MD  simvastatin (ZOCOR) 20 MG tablet Take 1 tablet (20 mg total) by mouth daily at 6 PM. Patient taking differently: Take 20 mg by mouth at bedtime. 08/19/21  Yes Azucena Fallen, MD  linagliptin (TRADJENTA) 5 MG TABS tablet Take 1 tablet (5 mg total) by mouth daily. Patient not taking: Reported on 12/26/2023 02/06/23   Rhetta Mura, MD      Allergies    Sulfa antibiotics and Latex    Review of Systems   Review of Systems  Physical Exam Updated Vital Signs BP (!) 155/86   Pulse 72   Temp 97.9 F (36.6 C) (Oral)   Resp 16   Ht 5\' 2"  (1.575 m)   Wt 68 kg   SpO2 96%   BMI 27.42 kg/m  Physical Exam Constitutional:      General: She is not in acute distress.    Appearance: Normal appearance. She is not ill-appearing.     Comments: Occasionally yelling out   HENT:     Head: Normocephalic.     Comments: Hematoma to left forehead    Right Ear: External ear normal.     Left Ear: External ear normal.     Mouth/Throat:     Mouth: Mucous membranes are moist.     Pharynx: Oropharynx is clear.  Eyes:     Extraocular Movements: Extraocular movements intact.     Conjunctiva/sclera: Conjunctivae normal.     Pupils: Pupils are equal, round, and reactive to light.     Comments: Pupils 2 mm bilateral  Neck:     Comments: C-collar in place Cardiovascular:     Rate and Rhythm: Normal rate and regular rhythm.     Pulses: Normal pulses.     Heart sounds: Normal heart sounds.  Pulmonary:     Effort: Pulmonary effort is normal. No respiratory distress.     Breath sounds: Normal breath sounds.  Abdominal:     General: Abdomen is flat. There is no distension.     Palpations: Abdomen is soft. There is no mass.     Tenderness: There is no abdominal tenderness. There is no guarding.  Musculoskeletal:        General: No deformity. Normal range of motion.     Comments: No tenderness to palpation of midline thoracic or lumbar spine.  No step-offs palpated.  No tenderness to palpation of chest wall.  No bruising noted.  No tenderness to palpation of bilateral clavicles.  No tenderness to palpation, bruising, or deformities noted of bilateral shoulders, elbows, hips, or ankles.  Bruising to right hand.  Small laceration on medial aspect of PIP.  Tenderness palpation of left knee.  Neurological:     General: No focal deficit present.     Mental Status: She is alert.     Comments: Alert and oriented to self only.  No gross cranial nerves deficits.  Moving all 4 extremities. Appears to be at mental baseline.      ED Results / Procedures / Treatments   Labs (all labs ordered are listed, but only abnormal results are displayed) Labs Reviewed  COMPREHENSIVE METABOLIC PANEL WITH GFR - Abnormal; Notable for the following components:      Result Value    Creatinine, Ser 1.04 (*)    Total Protein 6.4 (*)    Albumin 3.2 (*)    GFR, Estimated 51 (*)    All other components within normal limits  CBC  CK  URINALYSIS, ROUTINE W REFLEX MICROSCOPIC  SAMPLE TO BLOOD BANK    EKG EKG Interpretation Date/Time:  Friday March 01 2024 07:38:42 EDT Ventricular Rate:  64 PR Interval:  186 QRS Duration:  115 QT Interval:  428 QTC Calculation: 442 R  Axis:   8  Text Interpretation: Sinus rhythm Incomplete left bundle branch block Low voltage, extremity leads Confirmed by Vonita Moss [phone]) on 03/01/2024 7:43:44 AM  Radiology CT HEAD WO CONTRAST Result Date: 03/01/2024 CLINICAL DATA:  Head trauma, moderate to severe EXAM: CT HEAD WITHOUT CONTRAST CT CERVICAL SPINE WITHOUT CONTRAST TECHNIQUE: Multidetector CT imaging of the head and cervical spine was performed following the standard protocol without intravenous contrast. Multiplanar CT image reconstructions of the cervical spine were also generated. RADIATION DOSE REDUCTION: This exam was performed according to the departmental dose-optimization program which includes automated exposure control, adjustment of the mA and/or kV according to patient size and/or use of iterative reconstruction technique. COMPARISON:  02/13/2024 FINDINGS: CT HEAD FINDINGS Brain: No evidence of acute infarction, hemorrhage, hydrocephalus, extra-axial collection or mass lesion/mass effect. Generalized brain atrophy. Chronic small vessel ischemia in the cerebral white matter with chronic bilateral cerebellar infarcts. Vascular: No hyperdense vessel or unexpected calcification. Skull: No acute fracture.  Left anterior scalp hematoma. Sinuses/Orbits: No visible injury CT CERVICAL SPINE FINDINGS Alignment: Normal. Skull base and vertebrae: No acute fracture. No primary bone lesion or focal pathologic process. Soft tissues and spinal canal: No prevertebral fluid or swelling. No visible canal hematoma. Disc levels:  Ordinary,  generalized degenerative spurring. Upper chest: No evidence of injury IMPRESSION: No evidence of acute intracranial or cervical spine injury. Large scalp hematoma without fracture. Electronically Signed   By: Tiburcio Pea M.D.   On: 03/01/2024 09:18   CT CERVICAL SPINE WO CONTRAST Result Date: 03/01/2024 CLINICAL DATA:  Head trauma, moderate to severe EXAM: CT HEAD WITHOUT CONTRAST CT CERVICAL SPINE WITHOUT CONTRAST TECHNIQUE: Multidetector CT imaging of the head and cervical spine was performed following the standard protocol without intravenous contrast. Multiplanar CT image reconstructions of the cervical spine were also generated. RADIATION DOSE REDUCTION: This exam was performed according to the departmental dose-optimization program which includes automated exposure control, adjustment of the mA and/or kV according to patient size and/or use of iterative reconstruction technique. COMPARISON:  02/13/2024 FINDINGS: CT HEAD FINDINGS Brain: No evidence of acute infarction, hemorrhage, hydrocephalus, extra-axial collection or mass lesion/mass effect. Generalized brain atrophy. Chronic small vessel ischemia in the cerebral white matter with chronic bilateral cerebellar infarcts. Vascular: No hyperdense vessel or unexpected calcification. Skull: No acute fracture.  Left anterior scalp hematoma. Sinuses/Orbits: No visible injury CT CERVICAL SPINE FINDINGS Alignment: Normal. Skull base and vertebrae: No acute fracture. No primary bone lesion or focal pathologic process. Soft tissues and spinal canal: No prevertebral fluid or swelling. No visible canal hematoma. Disc levels:  Ordinary, generalized degenerative spurring. Upper chest: No evidence of injury IMPRESSION: No evidence of acute intracranial or cervical spine injury. Large scalp hematoma without fracture. Electronically Signed   By: Tiburcio Pea M.D.   On: 03/01/2024 09:18   DG Knee 1-2 Views Left Result Date: 03/01/2024 CLINICAL DATA:  Left knee  pain after fall. EXAM: LEFT KNEE - 1-2 VIEW COMPARISON:  August 03, 2022. FINDINGS: No evidence of fracture, dislocation, or joint effusion. Severe narrowing of medial joint space is noted. Soft tissues are unremarkable. IMPRESSION: Severe degenerative joint disease is noted medially. No acute abnormality seen. Electronically Signed   By: Lupita Raider M.D.   On: 03/01/2024 08:44   DG Shoulder Right Result Date: 03/01/2024 CLINICAL DATA:  Pain in the right shoulder after fall EXAM: RIGHT SHOULDER - 3 VIEW COMPARISON:  12/27/2016 FINDINGS: Chronic posttraumatic deformity to the proximal humerus related to fracture seen in 2018.  There is superimposed degenerative glenohumeral spurring. Generalized osteopenia. No acute fracture or dislocation. IMPRESSION: Remote and healed right proximal humerus fracture. No acute finding. Gleno humeral osteoarthritis. Electronically Signed   By: Tiburcio Pea M.D.   On: 03/01/2024 08:41   DG Hand 2 View Right Result Date: 03/01/2024 CLINICAL DATA:  Status post fall. Complains of laceration to little finger. EXAM: RIGHT HAND - 2 VIEW COMPARISON:  None. FINDINGS: Bones appear diffusely osteopenic. There are signs of a nondisplaced fracture involving the mid and distal shaft of the fifth proximal phalanx with possible extension into the PIP joint. Additional linear lucency is noted within the proximal and mid shaft of the fifth middle phalanx which is also worrisome for a nondisplaced fracture. No additional acute osseous abnormality. Diffuse degenerative changes are noted involving the DIP joints. 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 Soft tissue swelling about the fifth digit. IMPRESSION: 1. Nondisplaced fracture involving the mid and distal shaft of the fifth proximal phalanx with possible extension into the PIP joint. 2. Additional linear lucency within the proximal and mid shaft of the fifth middle phalanx is also worrisome for a nondisplaced fracture. 3.  Osteopenia. 4. Osteoarthritis. Electronically Signed   By: Signa Kell M.D.   On: 03/01/2024 07:50   DG Pelvis Portable Result Date: 03/01/2024 CLINICAL DATA:  Trauma. Status post fall. Complains of left knee pain. EXAM: PORTABLE PELVIS 1-2 VIEWS COMPARISON:  02/13/2024 FINDINGS: Again seen are changes from previous open reduction and internal fixation of proximal right femur fracture. Remote right superior and inferior pubic rami fractures. No signs of acute fracture or dislocation. No evidence for pelvic diastasis. IMPRESSION: 1. No acute findings. 2. Remote right superior and inferior pubic rami fractures. 3. Previous ORIF of the right femur. Electronically Signed   By: Signa Kell M.D.   On: 03/01/2024 07:46   DG Chest Port 1 View Result Date: 03/01/2024 CLINICAL DATA:  Fall from bed EXAM: PORTABLE CHEST 1 VIEW COMPARISON:  02/13/2024 FINDINGS: Normal heart size and mediastinal contours allowing for rightward rotation. There is no edema, consolidation, effusion, or pneumothorax. No osseous findings. IMPRESSION: No active disease. Electronically Signed   By: Tiburcio Pea M.D.   On: 03/01/2024 07:41    Procedures .Ortho Injury Treatment  Date/Time: 03/01/2024 5:19 PM  Performed by: Rondel Baton, MD Authorized by: Rondel Baton, MD  Injury location: hand Location details: right hand Injury type: fracture Pre-procedure neurovascular assessment: neurovascularly intact Manipulation performed: no Immobilization: splint Splint type: radial gutter Splint Applied by: ED Tech Supplies used: Ortho-Glass Post-procedure neurovascular assessment: post-procedure neurovascularly intact       Medications Ordered in ED Medications  ceFAZolin (ANCEF) IVPB 2g/100 mL premix (0 g Intravenous Stopped 03/01/24 0909)  Tdap (BOOSTRIX) injection 0.5 mL (0.5 mLs Intramuscular Given 03/01/24 0905)  fentaNYL (SUBLIMAZE) injection 25 mcg (25 mcg Intravenous Given 03/01/24 1478)    ED Course/  Medical Decision Making/ A&P Clinical Course as of 03/01/24 1720  Fri Mar 01, 2024  1020 Micheal jefferys at the bedside.  [RP]    Clinical Course User Index [RP] Rondel Baton, MD                                 Medical Decision Making Amount and/or Complexity of Data Reviewed Labs: ordered. Radiology: ordered.  Risk Prescription drug management.   [name] is a 89 y.o. female with comorbidities that complicate the patient  evaluation including Alzheimer dementia, CHF, and atrial fibrillation on Eliquis who presents emergency department after a fall.   Initial Ddx:  TBI, C-spine injury, hand fracture, knee fracture, hip fracture, rhabdomyolysis  MDM/Course:  Patient presents emergency department after unwitnessed fall.  Has obvious head trauma.  Also has significant bruising to her hand.  Has a small laceration on the medial aspect of her pinky.  Is complaining of diffuse pains and was told by EMS that she often times will yell spontaneously about things which makes her evaluation very difficult.  CT of the head and C-spine without acute abnormality.  Had x-rays of her chest, pelvis, right shoulder, right hand, and left knee where she was complaining of pain.  It showed a proximal phalanx fracture of the left small finger.  With the laceration was given Ancef and had her tetanus updated.  There is some concern that it could potentially be open so hand surgery was consulted.  They recommended washout, ulnar gutter splint, and follow-up as an outpatient.  Was given a prescription of Keflex to take at home.  Upon re-evaluation patient feeling much better.  This patient presents to the ED for concern of complaints listed in HPI, this involves an extensive number of treatment options, and is a complaint that carries with it a high risk of complications and morbidity. Disposition including potential need for admission considered.   Dispo: DC Home. Return precautions discussed  including, but not limited to, those listed in the AVS. Allowed pt time to ask questions which were answered fully prior to dc.  Additional history obtained from EMS Records reviewed Outpatient Clinic Notes I independently reviewed the following imaging with scope of interpretation limited to determining acute life threatening conditions related to emergency care: Extremity x-ray(s) and agree with the radiologist interpretation with the following exceptions: none I personally reviewed and interpreted the pt's EKG: see above for interpretation  I have reviewed the patients home medications and made adjustments as needed Consults:  Hand surgery Social Determinants of health:  Geriatric   Portions of this note were generated with Scientist, clinical (histocompatibility and immunogenetics). Dictation errors may occur despite best attempts at proofreading.     Final Clinical Impression(s) / ED Diagnoses Final diagnoses:  Closed nondisplaced fracture of proximal phalanx of right little finger, initial encounter  Minor head injury, initial encounter    Rx / DC Orders ED Discharge Orders          Ordered    cephALEXin (KEFLEX) 500 MG capsule  2 times daily        03/01/24 1036              Rondel Baton, MD 03/01/24 1720

## 2024-03-01 NOTE — Consult Note (Signed)
 Reason for Consult:Right little finger fx Referring Physician: Vonita Moss Time called: 0830 Time at bedside: 0939   [name] is an 89 y.o. female.  HPI: [name] [facility] bed at the [facility]. She was brought to the ED where workup showed a right little finger proximal and maybe middle phalangeal fxs and hand surgery was consulted. There was a small laceration on the proximal phalanx but not considered to be an open fx given depth and minimal bone displacement. She is demented and cannot contribute to history.  Past Medical History:  Diagnosis Date   Allergies    Alzheimer disease (HCC)    STABLE   Anemia, iron deficiency 12/12/2011   Arthritis    RF   CKD (chronic kidney disease)    STAGE 3   Diabetes mellitus    no mes, diet only    Diarrhea    RESOLVED   Hypercholesterolemia    Hyperlipemia    Hypertension    Major depression    OA (osteoarthritis)    HAND/LEFT KNEE PAIN MILD   Osteoporosis    Stroke [facility])    Syncope     Past Surgical History:  Procedure Laterality Date   INTRAMEDULLARY (IM) NAIL INTERTROCHANTERIC Right 09/27/2021   Procedure: INTRAMEDULLARY (IM) NAIL INTERTROCHANTRIC;  Surgeon: Roby Lofts, MD;  Location: [facility] OR;  Service: Orthopedics;  Laterality: Right;   left knee arthroscopy     ORIF HUMERUS FRACTURE  12/09/2011   Procedure: OPEN REDUCTION INTERNAL FIXATION (ORIF) DISTAL HUMERUS FRACTURE;  Surgeon: Sharma Covert, MD;  Location: [facility] ORS;  Service: Orthopedics;  Laterality: Right;   OTHER SURGICAL HISTORY     left small toe bone spur removed    TONSILLECTOMY      Family History  Problem Relation Age of Onset   Heart Problems Mother    Heart attack Maternal Grandmother        died from a "heart attack"   Stroke Neg Hx    Arrhythmia Neg Hx     Social History:  reports that she quit smoking about 14 years ago. Her smoking use included cigarettes. She has never used smokeless tobacco. She reports that she does not  drink alcohol and does not use drugs.  Allergies:  Allergies  Allergen Reactions   Sulfa Antibiotics Hives, Diarrhea, Nausea And Vomiting, Swelling and Other (See Comments)    Facial swelling and GI intolerance, ALSO   Latex Rash and Other (See Comments)    "Allergic," per MAR    Medications: I have reviewed the patient's current medications.  Results for orders placed or performed during the hospital encounter of 03/01/24 (from the past 48 hours)  Comprehensive metabolic panel     Status: Abnormal   Collection Time: 03/01/24  7:55 AM  Result Value Ref Range   Sodium 139 135 - 145 mmol/L   Potassium 3.9 3.5 - 5.1 mmol/L   Chloride 106 98 - 111 mmol/L   CO2 25 22 - 32 mmol/L   Glucose, Bld 95 70 - 99 mg/dL    Comment: Glucose reference range applies only to samples taken after fasting for at least 8 hours.   BUN 17 8 - 23 mg/dL   Creatinine, Ser 2.13 (H) 0.44 - 1.00 mg/dL   Calcium 9.0 8.9 - 08.6 mg/dL   Total Protein 6.4 (L) 6.5 - 8.1 g/dL   Albumin 3.2 (L) 3.5 - 5.0 g/dL   AST 19 15 - 41 U/L  ALT 10 0 - 44 U/L   Alkaline Phosphatase 86 38 - 126 U/L   Total Bilirubin 0.6 0.0 - 1.2 mg/dL   GFR, Estimated 51 (L) >60 mL/min    Comment: (NOTE) Calculated using the CKD-EPI Creatinine Equation (2021)    Anion gap 8 5 - 15    Comment: Performed at [facility], [address] [place], [place] [address]  CBC     Status: None   Collection Time: 03/01/24  7:55 AM  Result Value Ref Range   WBC 7.8 4.0 - 10.5 K/uL   RBC 4.36 3.87 - 5.11 MIL/uL   Hemoglobin 12.2 12.0 - 15.0 g/dL   HCT 60.4 54.0 - 98.1 %   MCV 91.7 80.0 - 100.0 fL   MCH 28.0 26.0 - 34.0 pg   MCHC 30.5 30.0 - 36.0 g/dL   RDW 19.1 47.8 - 29.5 %   Platelets 227 150 - 400 K/uL   nRBC 0.0 0.0 - 0.2 %    Comment: Performed at [facility], [address], [place], [place] [address]  Sample to Blood Bank     Status: None   Collection Time: 03/01/24  7:55 AM  Result Value Ref Range   Blood Bank Specimen  SAMPLE AVAILABLE FOR TESTING    Sample Expiration      03/04/2024,2359 Performed at [facility], [address], [place], [place] [address]   CK     Status: None   Collection Time: 03/01/24  7:55 AM  Result Value Ref Range   Total CK 48 38 - 234 U/L    Comment: Performed at [facility], [address] [place], [place] [address]    CT HEAD WO CONTRAST Result Date: 03/01/2024 CLINICAL DATA:  Head trauma, moderate to severe EXAM: CT HEAD WITHOUT CONTRAST CT CERVICAL SPINE WITHOUT CONTRAST TECHNIQUE: Multidetector CT imaging of the head and cervical spine was performed following the standard protocol without intravenous contrast. Multiplanar CT image reconstructions of the cervical spine were also generated. RADIATION DOSE REDUCTION: This exam was performed according to the departmental dose-optimization program which includes automated exposure control, adjustment of the mA and/or kV according to patient size and/or use of iterative reconstruction technique. COMPARISON:  02/13/2024 FINDINGS: CT HEAD FINDINGS Brain: No evidence of acute infarction, hemorrhage, hydrocephalus, extra-axial collection or mass lesion/mass effect. Generalized brain atrophy. Chronic small vessel ischemia in the cerebral white matter with chronic bilateral cerebellar infarcts. Vascular: No hyperdense vessel or unexpected calcification. Skull: No acute fracture.  Left anterior scalp hematoma. Sinuses/Orbits: No visible injury CT CERVICAL SPINE FINDINGS Alignment: Normal. Skull base and vertebrae: No acute fracture. No primary bone lesion or focal pathologic process. Soft tissues and spinal canal: No prevertebral fluid or swelling. No visible canal hematoma. Disc levels:  Ordinary, generalized degenerative spurring. Upper chest: No evidence of injury IMPRESSION: No evidence of acute intracranial or cervical spine injury. Large scalp hematoma without fracture. Electronically Signed   By: Tiburcio Pea M.D.   On:  03/01/2024 09:18   CT CERVICAL SPINE WO CONTRAST Result Date: 03/01/2024 CLINICAL DATA:  Head trauma, moderate to severe EXAM: CT HEAD WITHOUT CONTRAST CT CERVICAL SPINE WITHOUT CONTRAST TECHNIQUE: Multidetector CT imaging of the head and cervical spine was performed following the standard protocol without intravenous contrast. Multiplanar CT image reconstructions of the cervical spine were also generated. RADIATION DOSE REDUCTION: This exam was performed according to the departmental dose-optimization program which includes automated exposure control, adjustment of  the mA and/or kV according to patient size and/or use of iterative reconstruction technique. COMPARISON:  02/13/2024 FINDINGS: CT HEAD FINDINGS Brain: No evidence of acute infarction, hemorrhage, hydrocephalus, extra-axial collection or mass lesion/mass effect. Generalized brain atrophy. Chronic small vessel ischemia in the cerebral white matter with chronic bilateral cerebellar infarcts. Vascular: No hyperdense vessel or unexpected calcification. Skull: No acute fracture.  Left anterior scalp hematoma. Sinuses/Orbits: No visible injury CT CERVICAL SPINE FINDINGS Alignment: Normal. Skull base and vertebrae: No acute fracture. No primary bone lesion or focal pathologic process. Soft tissues and spinal canal: No prevertebral fluid or swelling. No visible canal hematoma. Disc levels:  Ordinary, generalized degenerative spurring. Upper chest: No evidence of injury IMPRESSION: No evidence of acute intracranial or cervical spine injury. Large scalp hematoma without fracture. Electronically Signed   By: Tiburcio Pea M.D.   On: 03/01/2024 09:18   DG Knee 1-2 Views Left Result Date: 03/01/2024 CLINICAL DATA:  Left knee pain after fall. EXAM: LEFT KNEE - 1-2 VIEW COMPARISON:  August 03, 2022. FINDINGS: No evidence of fracture, dislocation, or joint effusion. Severe narrowing of medial joint space is noted. Soft tissues are unremarkable. IMPRESSION:  Severe degenerative joint disease is noted medially. No acute abnormality seen. Electronically Signed   By: Lupita Raider M.D.   On: 03/01/2024 08:44   DG Shoulder Right Result Date: 03/01/2024 CLINICAL DATA:  Pain in the right shoulder after fall EXAM: RIGHT SHOULDER - 3 VIEW COMPARISON:  12/27/2016 FINDINGS: Chronic posttraumatic deformity to the proximal humerus related to fracture seen in 2018. There is superimposed degenerative glenohumeral spurring. Generalized osteopenia. No acute fracture or dislocation. IMPRESSION: Remote and healed right proximal humerus fracture. No acute finding. Gleno humeral osteoarthritis. Electronically Signed   By: Tiburcio Pea M.D.   On: 03/01/2024 08:41   DG Hand 2 View Right Result Date: 03/01/2024 CLINICAL DATA:  Status post fall. Complains of laceration to little finger. EXAM: RIGHT HAND - 2 VIEW COMPARISON:  None. FINDINGS: Bones appear diffusely osteopenic. There are signs of a nondisplaced fracture involving the mid and distal shaft of the fifth proximal phalanx with possible extension into the PIP joint. Additional linear lucency is noted within the proximal and mid shaft of the fifth middle phalanx which is also worrisome for a nondisplaced fracture. No additional acute osseous abnormality. Diffuse degenerative changes are noted involving the DIP joints. 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 Soft tissue swelling about the fifth digit. IMPRESSION: 1. Nondisplaced fracture involving the mid and distal shaft of the fifth proximal phalanx with possible extension into the PIP joint. 2. Additional linear lucency within the proximal and mid shaft of the fifth middle phalanx is also worrisome for a nondisplaced fracture. 3. Osteopenia. 4. Osteoarthritis. Electronically Signed   By: Signa Kell M.D.   On: 03/01/2024 07:50   DG Pelvis Portable Result Date: 03/01/2024 CLINICAL DATA:  Trauma. Status post fall. Complains of left knee pain. EXAM: PORTABLE PELVIS  1-2 VIEWS COMPARISON:  02/13/2024 FINDINGS: Again seen are changes from previous open reduction and internal fixation of proximal right femur fracture. Remote right superior and inferior pubic rami fractures. No signs of acute fracture or dislocation. No evidence for pelvic diastasis. IMPRESSION: 1. No acute findings. 2. Remote right superior and inferior pubic rami fractures. 3. Previous ORIF of the right femur. Electronically Signed   By: Signa Kell M.D.   On: 03/01/2024 07:46   DG Chest Port 1 View Result Date: 03/01/2024 CLINICAL DATA:  Fall from bed  EXAM: PORTABLE CHEST 1 VIEW COMPARISON:  02/13/2024 FINDINGS: Normal heart size and mediastinal contours allowing for rightward rotation. There is no edema, consolidation, effusion, or pneumothorax. No osseous findings. IMPRESSION: No active disease. Electronically Signed   By: Tiburcio Pea M.D.   On: 03/01/2024 07:41    Review of Systems  Unable to perform ROS: Dementia   Blood pressure (!) 172/59, pulse 62, temperature 98.7 F (37.1 C), temperature source Oral, resp. rate 18, height 5\' 2"  (1.575 m), weight 68 kg, SpO2 98%. Physical Exam Constitutional:      General: She is not in acute distress.    Appearance: She is well-developed. She is not diaphoretic.  HENT:     Head: Normocephalic.  Eyes:     General: No scleral icterus.       Right eye: No discharge.        Left eye: No discharge.     Conjunctiva/sclera: Conjunctivae normal.  Cardiovascular:     Rate and Rhythm: Normal rate and regular rhythm.  Pulmonary:     Effort: Pulmonary effort is normal. No respiratory distress.  Musculoskeletal:     Cervical back: Normal range of motion.     Comments: Right shoulder, elbow, wrist, digits- Superficial laceration radial aspect little finger P1, large ecchymosis dorsal hand and little finger, no instability, no blocks to motion  Sens  Ax/R/M/U could not assess  Mot   Ax/ R/ PIN/ M/ AIN/ U grossly intact  Rad 2+  Skin:    General:  Skin is warm and dry.  Neurological:     Mental Status: She is alert.  Psychiatric:        Mood and Affect: Mood normal.        Behavior: Behavior normal.     Assessment/Plan: Right little finger fx -- Plan ulnar gutter splint. D/c on Keflex. F/u with Dr. Kerry Fort next week.    Freeman Caldron, PA-C Orthopedic Surgery [phone] 03/01/2024, 9:42 AM

## 2024-03-01 NOTE — ED Notes (Signed)
 [facility] arrived to transport pt back to [facility]

## 2024-03-01 NOTE — ED Triage Notes (Signed)
 Patient presents to ed via [facility] from [facility]  fall this am on thinners. Patient fell out of bed. Laceration to left little finger , c/o pain left knee and hematoma over left eye. Patient has a history dementia Patient confused at his baseline. [name]

## 2024-03-01 NOTE — Progress Notes (Signed)
   03/01/24 0728  Spiritual Encounters  Type of Visit Initial  Care provided to: Patient  Conversation partners present during encounter Nurse  Reason for visit Trauma  OnCall Visit Yes   Responded to level ll trauma, patient alert, no family present, patient confused. FOT, laceration to head

## 2024-03-01 NOTE — ED Notes (Signed)
 Patient unable to urinate at this time.

## 2024-03-01 NOTE — Discharge Instructions (Addendum)
 You were seen for your fall in the emergency department. Your pinky finger is broken.   At home, please take the antibiotics to prevent infection of your finger.    Check your MyChart online for the results of any tests that had not resulted by the time you left the emergency department.   Follow-up with your primary doctor in 2-3 days regarding your visit.  Follow-up with hand surgery about your broken pinky.   Return immediately to the emergency department if you experience any of the following: severe pain, or any other concerning symptoms.    Thank you for visiting our Emergency Department. It was a pleasure taking care of you today.

## 2024-03-01 NOTE — ED Notes (Signed)
[facility] called, eta 1 to 1.5 hours

## 2024-03-01 NOTE — ED Notes (Signed)
 Patient resting on stretcher still waiting [facility]

## 2024-09-22 ENCOUNTER — Emergency Department (HOSPITAL_COMMUNITY)

## 2024-09-22 ENCOUNTER — Other Ambulatory Visit: Payer: Self-pay

## 2024-09-22 ENCOUNTER — Encounter (HOSPITAL_COMMUNITY): Payer: Self-pay | Admitting: Emergency Medicine

## 2024-09-22 ENCOUNTER — Emergency Department (HOSPITAL_COMMUNITY)
Admission: EM | Admit: 2024-09-22 | Discharge: 2024-09-22 | Disposition: A | Discharge status: Skilled Nursing Facility | Attending: Emergency Medicine | Admitting: Emergency Medicine

## 2024-09-22 DIAGNOSIS — S42294A Other nondisplaced fracture of upper end of right humerus, initial encounter for closed fracture: Secondary | ICD-10-CM | POA: Diagnosis not present

## 2024-09-22 DIAGNOSIS — M25551 Pain in right hip: Secondary | ICD-10-CM | POA: Diagnosis not present

## 2024-09-22 DIAGNOSIS — W19XXXA Unspecified fall, initial encounter: Secondary | ICD-10-CM

## 2024-09-22 DIAGNOSIS — R519 Headache, unspecified: Secondary | ICD-10-CM | POA: Diagnosis not present

## 2024-09-22 DIAGNOSIS — W010XXA Fall on same level from slipping, tripping and stumbling without subsequent striking against object, initial encounter: Secondary | ICD-10-CM | POA: Diagnosis not present

## 2024-09-22 DIAGNOSIS — S4991XA Unspecified injury of right shoulder and upper arm, initial encounter: Secondary | ICD-10-CM | POA: Diagnosis present

## 2024-09-22 LAB — CBC WITH DIFFERENTIAL/PLATELET
Abs Immature Granulocytes: 0.07 K/uL (ref 0.00–0.07)
Basophils Absolute: 0 K/uL (ref 0.0–0.1)
Basophils Relative: 0 %
Eosinophils Absolute: 0.1 K/uL (ref 0.0–0.5)
Eosinophils Relative: 1 %
HCT: 40 % (ref 36.0–46.0)
Hemoglobin: 12.5 g/dL (ref 12.0–15.0)
Immature Granulocytes: 1 %
Lymphocytes Relative: 11 %
Lymphs Abs: 1.5 K/uL (ref 0.7–4.0)
MCH: 28.8 pg (ref 26.0–34.0)
MCHC: 31.3 g/dL (ref 30.0–36.0)
MCV: 92.2 fL (ref 80.0–100.0)
Monocytes Absolute: 0.7 K/uL (ref 0.1–1.0)
Monocytes Relative: 5 %
Neutro Abs: 11.7 K/uL — ABNORMAL HIGH (ref 1.7–7.7)
Neutrophils Relative %: 82 %
Platelets: 227 K/uL (ref 150–400)
RBC: 4.34 MIL/uL (ref 3.87–5.11)
RDW: 13.6 % (ref 11.5–15.5)
WBC: 14.1 K/uL — ABNORMAL HIGH (ref 4.0–10.5)
nRBC: 0 % (ref 0.0–0.2)

## 2024-09-22 LAB — BASIC METABOLIC PANEL WITH GFR
Anion gap: 8 (ref 5–15)
BUN: 23 mg/dL (ref 8–23)
CO2: 27 mmol/L (ref 22–32)
Calcium: 9.1 mg/dL (ref 8.9–10.3)
Chloride: 103 mmol/L (ref 98–111)
Creatinine, Ser: 1.03 mg/dL — ABNORMAL HIGH (ref 0.44–1.00)
GFR, Estimated: 51 mL/min — ABNORMAL LOW (ref >60)
Glucose, Bld: 131 mg/dL — ABNORMAL HIGH (ref 70–99)
Potassium: 4.1 mmol/L (ref 3.5–5.1)
Sodium: 139 mmol/L (ref 135–145)

## 2024-09-22 MED ORDER — ACETAMINOPHEN 500 MG PO TABS
1000.0000 mg | ORAL_TABLET | Freq: Once | ORAL | Status: AC
  Administered 2024-09-22: 1000 mg via ORAL
  Filled 2024-09-22: qty 2

## 2024-09-22 MED ORDER — OXYCODONE HCL 5 MG PO TABS: 2.5000 mg | ORAL_TABLET | Freq: Four times a day (QID) | ORAL | 0 refills | Status: AC | PRN

## 2024-09-22 MED ORDER — FENTANYL CITRATE (PF) 50 MCG/ML IJ SOSY
50.0000 ug | PREFILLED_SYRINGE | Freq: Once | INTRAMUSCULAR | Status: AC
  Administered 2024-09-22: 50 ug via INTRAVENOUS
  Filled 2024-09-22: qty 1

## 2024-09-22 NOTE — ED Notes (Signed)
 [facility] called no response

## 2024-09-22 NOTE — Discharge Instructions (Addendum)
 You were seen today with a fall event.  You have a proximal right humerus fracture. In patients of your age bracket, these are treated nonoperatively.  You will need to stay in the sling and follow-up with Ortho care within 7 days for repeat x-rays.

## 2024-09-22 NOTE — ED Provider Notes (Signed)
 [place] EMERGENCY DEPARTMENT AT [facility] Provider Note   CSN: [id number] Arrival date & time: 09/22/24  0204     History Chief Complaint  Patient presents with   Fall    HPI [name] is a 89 y.o. female presenting for fall at skilled nursing facility.  Got up to check on another one of the patients when she tripped without her walker falling to her right.  Nurses right shoulder, right hip pain.  Does have a headache as well.  Not on anticoagulation..   Patient's recorded medical, surgical, social, medication list and allergies were reviewed in the Snapshot window as part of the initial history.   Review of Systems   Review of Systems  Constitutional:  Negative for chills and fever.  HENT:  Negative for ear pain and sore throat.   Eyes:  Negative for pain and visual disturbance.  Respiratory:  Negative for cough and shortness of breath.   Cardiovascular:  Negative for chest pain and palpitations.  Gastrointestinal:  Negative for abdominal pain and vomiting.  Genitourinary:  Negative for dysuria and hematuria.  Musculoskeletal:  Negative for arthralgias and back pain.  Skin:  Negative for color change and rash.  Neurological:  Negative for seizures and syncope.  All other systems reviewed and are negative.   Physical Exam Updated Vital Signs BP (!) 180/81   Pulse 65   Temp 98.6 F (37 C) (Oral)   Resp 17   SpO2 97%  Physical Exam Constitutional:      General: She is not in acute distress.    Appearance: She is not ill-appearing or toxic-appearing.  HENT:     Head: Normocephalic and atraumatic.  Eyes:     Extraocular Movements: Extraocular movements intact.     Pupils: Pupils are equal, round, and reactive to light.  Cardiovascular:     Rate and Rhythm: Normal rate.  Pulmonary:     Effort: No respiratory distress.  Abdominal:     General: Abdomen is flat.  Musculoskeletal:        General: Tenderness (Very tender in her right hip.  Not in  the right knee.  Tender in the right shoulder.  Not in the right elbow.) present. No swelling, deformity or signs of injury.     Cervical back: Normal range of motion. No rigidity.  Skin:    General: Skin is warm and dry.  Neurological:     General: No focal deficit present.     Mental Status: She is alert and oriented to person, place, and time.  Psychiatric:        Mood and Affect: Mood normal.      ED Course/ Medical Decision Making/ A&P    Procedures Procedures   Medications Ordered in ED Medications  acetaminophen  (TYLENOL ) tablet 1,000 mg (1,000 mg Oral Given 09/22/24 0309)  fentaNYL  (SUBLIMAZE ) injection 50 mcg (50 mcg Intravenous Given 09/22/24 0332)   Medical Decision Making:   [name] is a 89 y.o. female who presented to the ED today with a fall at their living facility detailed above. They are not on a blood thinner. Handoff received from EMS.  Patient placed on continuous vitals and telemetry monitoring while in ED which was reviewed periodically.   Complete initial physical exam performed, notably the patient  was hemodynamically stable  in no acute distress.  Reviewed and confirmed nursing documentation for past medical history, family history, social history.    Initial Assessment/Plan:   This is  a patient presenting with a moderate blunt mechanism trauma.  As such, I have considered intracranial injuries including intracranial hemorrhage, intrathoracic injuries including blunt myocardial or blunt lung injury, blunt abdominal injuries including aortic dissection, bladder injury, spleen injury, liver injury and I have considered orthopedic injuries including extremity or spinal injury. This is most consistent with an acute life/limb threatening illness complicated by underlying chronic conditions.  With the patient's presentation of moderate mechanism trauma but an otherwise reassuring exam, patient warrants targeted evaluation for potential traumatic injuries.  Will proceed with targeted evaluation for potential injuries. Will proceed with CT Head, Cervical Spine CT, and Chest/Pelvis XR.   Images reviewed and agree with radiology interpretation.  DG Hip Unilat W or Wo Pelvis 2-3 Views Right Result Date: 09/22/2024 EXAM: 2 or 3 VIEW(S) XRAY OF THE RIGHT HIP 09/22/2024 03:56:00 AM COMPARISON: Right hip series dated 04/02/2023. CLINICAL HISTORY: fall fall FINDINGS: BONES AND JOINTS: No apparent acute fracture. The patient is status post orthopedic fixation of an intertrochanteric fracture on the right. There are 2 orthopedic screws within the femoral head and neck and an interlocking rod within the proximal femoral shaft. There are old healed fractures of the right superior and inferior pubic rami. The hip joint is maintained. No significant degenerative changes. SOFT TISSUES: The soft tissues are unremarkable. IMPRESSION: 1. No acute fracture. 2. Status post orthopedic fixation of an intertrochanteric fracture on the right with 2 orthopedic screws within the femoral head and neck and an interlocking rod within the proximal femoral shaft. 3. Old healed fractures of the right superior and inferior pubic rami. Electronically signed by: Evalene Coho MD 09/22/2024 04:30 AM EST RP Workstation: [id number]   CT CERVICAL SPINE WO CONTRAST Result Date: 09/22/2024 EXAM: CT CERVICAL SPINE WITHOUT CONTRAST 09/22/2024 03:49:25 AM TECHNIQUE: CT of the cervical spine was performed without the administration of intravenous contrast. Multiplanar reformatted images are provided for review. Automated exposure control, iterative reconstruction, and/or weight based adjustment of the mA/kV was utilized to reduce the radiation dose to as low as reasonably achievable. COMPARISON: CT of the cervical spine dated 03/01/2024. CLINICAL HISTORY: Polytrauma, blunt. FINDINGS: CERVICAL SPINE: BONES AND ALIGNMENT: There is no evidence of fracture or acute traumatic injury. DEGENERATIVE CHANGES:  There are moderate degenerative changes within the atlantoaxial joint. There is disc space narrowing and endplate ridging at C3-C4, C5-C6 and C6-C7. There is mild diffuse facet arthropathy. SOFT TISSUES: No prevertebral soft tissue swelling. There are calcifications within the carotid bulbs bilaterally. IMPRESSION: 1. No evidence of fracture or acute traumatic injury. 2. Moderate degenerative changes within the atlantoaxial joint. 3. Disc space narrowing and endplate ridging at C3-4, C5-6, and C6-7. 4. Mild diffuse facet arthropathy. 5. Atherosclerotic calcifications in the carotid bulbs bilaterally. Consider clinical cardiovascular risk assessment and risk factor optimization. Electronically signed by: Evalene Coho MD 09/22/2024 04:27 AM EST RP Workstation: [id number]   CT HEAD WO CONTRAST ( ) Result Date: 09/22/2024 EXAM: CT HEAD WITHOUT CONTRAST 09/22/2024 03:49:25 AM TECHNIQUE: CT of the head was performed without the administration of intravenous contrast. Automated exposure control, iterative reconstruction, and/or weight based adjustment of the mA/kV was utilized to reduce the radiation dose to as low as reasonably achievable. COMPARISON: 03/01/2024 CLINICAL HISTORY: Head trauma, minor (Age >= 65y) FINDINGS: BRAIN AND VENTRICLES: No acute hemorrhage. No evidence of acute infarct. Patchy and confluent decreased attenuation throughout deep and periventricular white matter bilaterally, compatible with chronic microvascular ischemic disease. Cerebral ventricle sizes concordant with degree of cerebral volume loss. No extra-axial collection.  No mass effect or midline shift. ORBITS: Bilateral lens replacement. SINUSES: No acute abnormality. SOFT TISSUES AND SKULL: No acute soft tissue abnormality. No skull fracture. IMPRESSION: 1. No acute intracranial abnormality. 2. Chronic microvascular ischemic disease in the deep and periventricular white matter bilaterally. 3. Cerebral volume loss with ventricle sizes  concordant with the degree of volume loss. Electronically signed by: Evalene Coho MD 09/22/2024 04:24 AM EST RP Workstation: [id number]   DG Shoulder Right Result Date: 09/22/2024 CLINICAL DATA:  Status post fall. EXAM: RIGHT SHOULDER - 2+ VIEW COMPARISON:  March 01, 2024 FINDINGS: An acute fracture deformity is seen involving the head and neck of the proximal right humerus. There is no evidence of dislocation. Radiopaque fixation plates and screws are seen within the visualized portion of the distal right humerus. Mild degenerative changes are present involving the right acromioclavicular joint and right glenohumeral articulation. Soft tissue swelling is seen along the lateral aspect of the previously noted fracture site. IMPRESSION: 1. Acute fracture of the proximal right humerus. 2. Prior ORIF of the distal right humerus. Electronically Signed   By: Suzen Dials M.D.   On: 09/22/2024 03:22    Final Reassessment and Plan:   Patient with a proximal right humerus fracture.  Patient stated she would not want any kind of surgical intervention.  Regardless, in this age bracket these are typically managed nonoperatively at least initially.  Patient placed in the sling for comfort with medication sent to pharmacy for management of pain.  Information for follow-up with orthopedics given to the patient.  Offered to call and update family on her presentation today, but patient stated she did not want her family to be woken up in the middle of the night and that she would call them tomorrow.  Disposition:  I have considered need for hospitalization, however, considering all of the above, I believe this patient is stable for discharge at this time.  Patient/family educated about specific return precautions for given chief complaint and symptoms.  Patient/family educated about follow-up with PCP.     Patient/family expressed understanding of return precautions and need for follow-up. Patient spoken to  regarding all imaging and laboratory results and appropriate follow up for these results. All education provided in verbal form with additional information in written form. Time was allowed for answering of patient questions. Patient discharged.    Emergency Department Medication Summary:   Medications  acetaminophen  (TYLENOL ) tablet 1,000 mg (1,000 mg Oral Given 09/22/24 0309)  fentaNYL  (SUBLIMAZE ) injection 50 mcg (50 mcg Intravenous Given 09/22/24 0332)           Clinical Impression:  1. Other closed nondisplaced fracture of proximal end of right humerus, initial encounter   2. Fall, initial encounter      Discharge   Final Clinical Impression(s) / ED Diagnoses Final diagnoses:  Other closed nondisplaced fracture of proximal end of right humerus, initial encounter  Fall, initial encounter    Rx / DC Orders ED Discharge Orders          Ordered    oxyCODONE  (ROXICODONE ) 5 MG immediate release tablet  Every 6 hours PRN        09/22/24 0514              Jerral Meth, MD 09/22/24 [phone]

## 2024-09-22 NOTE — ED Triage Notes (Signed)
 Pt arrives from [facility]/ c/o fall today. Pt fell into dresser & hit her head. Also c/o rt shoulder & hip pain. Hx dementia

## 2024-10-04 ENCOUNTER — Ambulatory Visit (INDEPENDENT_AMBULATORY_CARE_PROVIDER_SITE_OTHER): Admitting: Orthopaedic Surgery

## 2024-10-04 ENCOUNTER — Other Ambulatory Visit (INDEPENDENT_AMBULATORY_CARE_PROVIDER_SITE_OTHER): Payer: Self-pay

## 2024-10-04 DIAGNOSIS — S42291A Other displaced fracture of upper end of right humerus, initial encounter for closed fracture: Secondary | ICD-10-CM | POA: Diagnosis not present

## 2024-10-04 NOTE — Progress Notes (Signed)
 Office Visit Note   Patient: [name]           Date of Birth: Sep 13, 1932           MRN: [id number] Visit Date: 10/04/2024              Requested by: No referring provider defined for this encounter. PCP: Pcp, No   Assessment & Plan: Visit Diagnoses:  1. Closed 3-part fracture of proximal end of right humerus, initial encounter     Plan: History of Present Illness [name] is a 89 year old female who presents with follow-up after a right proximal humerus fracture. She is accompanied by her daughter.  She initially presented to the emergency room on November 2nd with a proximal humerus fracture. Her arm pain persists but is less severe than initially. She is on Eliquis , resulting in significant bruising around the fracture site. Despite the bruising, she does not experience severe pain. She has a history of an elbow fracture treated with plates and screws. She retains sensation to touch and has a strong grip.  Physical Exam CARDIOVASCULAR: Good pulse present. NEUROLOGICAL: Sensation intact. Strong grip strength. NVI distally.  Results RADIOLOGY Shoulder X-ray: Proper alignment, evidence of healing observed (10/04/2024)  Assessment and Plan Mildly displaced fracture of right proximal humerus.  Good alignment and healing on x-rays. Bruising exacerbated by Eliquis . Pain reduced, indicating healing. - Continue sling immobilization for three weeks. - Begin gentle range of motion exercises after three weeks, within pain limits. - Initiate physical therapy at six weeks. - Follow up in four weeks for repeat x-rays to assess healing.  Follow-Up Instructions: Return in about 4 weeks (around 11/01/2024).   Orders:  Orders Placed This Encounter  Procedures   XR Shoulder Right   No orders of the defined types were placed in this encounter.     Procedures: No procedures performed   Clinical Data: No additional findings.   Subjective: Chief Complaint  Patient  presents with   Right Shoulder - Injury    HPI  Review of Systems  Constitutional: Negative.   HENT: Negative.    Eyes: Negative.   Respiratory: Negative.    Cardiovascular: Negative.   Endocrine: Negative.   Musculoskeletal: Negative.   Neurological: Negative.   Hematological: Negative.   Psychiatric/Behavioral: Negative.    All other systems reviewed and are negative.    Objective: Vital Signs: There were no vitals taken for this visit.  Physical Exam Vitals and nursing note reviewed.  Constitutional:      Appearance: She is well-developed.  HENT:     Head: Atraumatic.     Nose: Nose normal.  Eyes:     Extraocular Movements: Extraocular movements intact.  Cardiovascular:     Pulses: Normal pulses.  Pulmonary:     Effort: Pulmonary effort is normal.  Abdominal:     Palpations: Abdomen is soft.  Musculoskeletal:     Cervical back: Neck supple.  Skin:    General: Skin is warm.     Capillary Refill: Capillary refill takes less than 2 seconds.  Neurological:     Mental Status: She is alert. Mental status is at baseline.  Psychiatric:        Behavior: Behavior normal.        Thought Content: Thought content normal.        Judgment: Judgment normal.     Ortho Exam  Specialty Comments:  No specialty comments available.  Imaging: XR Shoulder Right Result Date:  10/04/2024 3 view xrays show subacute proximal fracture in acceptable alignment.    PMFS History: Patient Active Problem List   Diagnosis Date Noted   Closed 3-part fracture of proximal end of right humerus 10/04/2024   AKI (acute kidney injury) 12/26/2023   COVID-19 virus infection 01/26/2023   Acute respiratory failure with hypoxia (HCC) 01/26/2023   Generalized weakness 01/26/2023   Chronic diastolic CHF (congestive heart failure) (HCC) 01/26/2023   Acute cystitis 08/07/2022   Acute cystitis without hematuria 08/06/2022   Scalp hematoma 08/06/2022   Fall 08/06/2022   Distal radial  fracture 08/06/2022   Hypertensive urgency 08/06/2022   Paroxysmal atrial fibrillation (HCC) 08/06/2022   Chronic anticoagulation 08/06/2022   Acute on chronic renal insufficiency 08/06/2022   (HFpEF) heart failure with preserved ejection fraction (HCC) 08/06/2022   Closed right hip fracture, initial encounter (HCC) 09/26/2021   Head trauma 09/26/2021   Hypocalcemia 09/26/2021   Hyperglycemia 09/26/2021   Acute metabolic encephalopathy 08/10/2021   COVID-19 08/09/2021   History of syncope 08/13/2019   Syncope and collapse 01/23/2019   Leukocytosis 01/23/2019   Diarrhea 01/23/2019   Prolonged QT interval 01/23/2019   Acute confusional state 06/19/2015   Memory loss 06/19/2015   DM2 (diabetes mellitus, type 2) (HCC) 05/14/2015   Hyperlipidemia 05/14/2015   Essential hypertension    TIA (transient ischemic attack) 05/13/2015   Anemia, iron deficiency 12/12/2011   Fracture of proximal end of left humerus 12/11/2011   Hypoxia 12/11/2011   Wide-complex tachycardia 12/11/2011   Acute hypotension 12/11/2011   Past Medical History:  Diagnosis Date   Allergies    Alzheimer disease (HCC)    STABLE   Anemia, iron deficiency 12/12/2011   Arthritis    RF   CKD (chronic kidney disease)    STAGE 3   Diabetes mellitus    no mes, diet only    Diarrhea    RESOLVED   Hypercholesterolemia    Hyperlipemia    Hypertension    Major depression    OA (osteoarthritis)    HAND/LEFT KNEE PAIN MILD   Osteoporosis    Stroke (HCC)    Syncope     Family History  Problem Relation Age of Onset   Heart Problems Mother    Heart attack Maternal Grandmother        died from a heart attack   Stroke Neg Hx    Arrhythmia Neg Hx     Past Surgical History:  Procedure Laterality Date   INTRAMEDULLARY (IM) NAIL INTERTROCHANTERIC Right 09/27/2021   Procedure: INTRAMEDULLARY (IM) NAIL INTERTROCHANTRIC;  Surgeon: Kendal Franky SQUIBB, MD;  Location: [facility] OR;  Service: Orthopedics;  Laterality: Right;    left knee arthroscopy     ORIF HUMERUS FRACTURE  12/09/2011   Procedure: OPEN REDUCTION INTERNAL FIXATION (ORIF) DISTAL HUMERUS FRACTURE;  Surgeon: Prentice LELON Pagan, MD;  Location: [facility] ORS;  Service: Orthopedics;  Laterality: Right;   OTHER SURGICAL HISTORY     left small toe bone spur removed    TONSILLECTOMY     Social History   Occupational History   Occupation: Retired  Tobacco Use   Smoking status: Former    Current packs/day: 0.00    Types: Cigarettes    Quit date: 12/29/2009    Years since quitting: 14.7   Smokeless tobacco: Never  Vaping Use   Vaping status: Never Used  Substance and Sexual Activity   Alcohol use: No    Alcohol/week: 0.0 standard drinks of alcohol   Drug use:  No   Sexual activity: Never

## 2024-11-05 ENCOUNTER — Ambulatory Visit: Admitting: Orthopaedic Surgery

## 2024-11-07 ENCOUNTER — Ambulatory Visit: Admitting: Physician Assistant

## 2024-11-07 ENCOUNTER — Other Ambulatory Visit: Payer: Self-pay

## 2024-11-07 DIAGNOSIS — S42291A Other displaced fracture of upper end of right humerus, initial encounter for closed fracture: Secondary | ICD-10-CM

## 2024-11-07 NOTE — Progress Notes (Signed)
 Office Visit Note   Patient: [name]           Date of Birth: [birth year]           MRN: [id number] Visit Date: 11/07/2024              Requested by: No referring provider defined for this encounter. PCP: Pcp, No   Assessment & Plan: Visit Diagnoses:  1. Closed 3-part fracture of proximal end of right humerus, initial encounter     Plan: Impression is 6 weeks status post right proximal humerus fracture.  At this point, would like to implement formal therapy at [facility].  She will remain nonweightbearing the right upper extremity.  She will follow-up in 4 weeks for repeat evaluation and x-rays of the right shoulder.  Call with concerns or questions.  Follow-Up Instructions: Return in about 4 weeks (around 12/05/2024).   Orders:  Orders Placed This Encounter  Procedures   XR Shoulder Right   No orders of the defined types were placed in this encounter.     Procedures: No procedures performed   Clinical Data: No additional findings.   Subjective: Chief Complaint  Patient presents with   Right Shoulder - Follow-up    Proximal humerus fracture    HPI patient is a pleasant 89 year old pleasantly demented female resident of [facility] who comes in today 6 weeks status post right proximal humerus fracture.  She feels much better.  Still having some discomfort, however.  She is unsure whether she has been working on range of motion exercises.  Review of Systems as detailed in HPI.  All others reviewed and are negative.   Objective: Vital Signs: There were no vitals taken for this visit.  Physical Exam well-developed well-nourished female in no acute distress.    Ortho Exam examination of the right shoulder reveals mild tenderness to the proximal humerus.  Limited range of motion.  She is neurovascular intact distally.  Specialty Comments:  No specialty comments available.  Imaging: XR Shoulder Right Result Date: 11/07/2024 X-rays demonstrate  consolidation of the fracture site.    PMFS History: Patient Active Problem List   Diagnosis Date Noted   Closed 3-part fracture of proximal end of right humerus 10/04/2024   AKI (acute kidney injury) 12/26/2023   COVID-19 virus infection 01/26/2023   Acute respiratory failure with hypoxia (HCC) 01/26/2023   Generalized weakness 01/26/2023   Chronic diastolic CHF (congestive heart failure) (HCC) 01/26/2023   Acute cystitis 08/07/2022   Acute cystitis without hematuria 08/06/2022   Scalp hematoma 08/06/2022   Fall 08/06/2022   Distal radial fracture 08/06/2022   Hypertensive urgency 08/06/2022   Paroxysmal atrial fibrillation (HCC) 08/06/2022   Chronic anticoagulation 08/06/2022   Acute on chronic renal insufficiency 08/06/2022   (HFpEF) heart failure with preserved ejection fraction (HCC) 08/06/2022   Closed right hip fracture, initial encounter (HCC) 09/26/2021   Head trauma 09/26/2021   Hypocalcemia 09/26/2021   Hyperglycemia 09/26/2021   Acute metabolic encephalopathy 08/10/2021   COVID-19 08/09/2021   History of syncope 08/13/2019   Syncope and collapse 01/23/2019   Leukocytosis 01/23/2019   Diarrhea 01/23/2019   Prolonged QT interval 01/23/2019   Acute confusional state 06/19/2015   Memory loss 06/19/2015   DM2 (diabetes mellitus, type 2) (HCC) 05/14/2015   Hyperlipidemia 05/14/2015   Essential hypertension    TIA (transient ischemic attack) 05/13/2015   Anemia, iron deficiency 12/12/2011   Fracture of proximal end of left humerus 12/11/2011   Hypoxia 12/11/2011  Wide-complex tachycardia 12/11/2011   Acute hypotension 12/11/2011   Past Medical History:  Diagnosis Date   Allergies    Alzheimer disease (HCC)    STABLE   Anemia, iron deficiency 12/12/2011   Arthritis    RF   CKD (chronic kidney disease)    STAGE 3   Diabetes mellitus    no mes, diet only    Diarrhea    RESOLVED   Hypercholesterolemia    Hyperlipemia    Hypertension    Major depression     OA (osteoarthritis)    HAND/LEFT KNEE PAIN MILD   Osteoporosis    Stroke (HCC)    Syncope     Family History  Problem Relation Age of Onset   Heart Problems Mother    Heart attack Maternal Grandmother        died from a heart attack   Stroke Neg Hx    Arrhythmia Neg Hx     Past Surgical History:  Procedure Laterality Date   INTRAMEDULLARY (IM) NAIL INTERTROCHANTERIC Right 09/27/2021   Procedure: INTRAMEDULLARY (IM) NAIL INTERTROCHANTRIC;  Surgeon: Kendal Franky SQUIBB, MD;  Location: [facility] OR;  Service: Orthopedics;  Laterality: Right;   left knee arthroscopy     ORIF HUMERUS FRACTURE  12/09/2011   Procedure: OPEN REDUCTION INTERNAL FIXATION (ORIF) DISTAL HUMERUS FRACTURE;  Surgeon: Prentice LELON Pagan, MD;  Location: [facility];  Service: Orthopedics;  Laterality: Right;   OTHER SURGICAL HISTORY     left small toe bone spur removed    TONSILLECTOMY     Social History   Occupational History   Occupation: Retired  Tobacco Use   Smoking status: Former    Current packs/day: 0.00    Types: Cigarettes    Quit date: 12/29/2009    Years since quitting: 14.8   Smokeless tobacco: Never  Vaping Use   Vaping status: Never Used  Substance and Sexual Activity   Alcohol use: No    Alcohol/week: 0.0 standard drinks of alcohol   Drug use: No   Sexual activity: Never

## 2024-12-05 ENCOUNTER — Ambulatory Visit: Admitting: Physician Assistant

## 2024-12-10 ENCOUNTER — Other Ambulatory Visit (INDEPENDENT_AMBULATORY_CARE_PROVIDER_SITE_OTHER): Payer: Self-pay

## 2024-12-10 ENCOUNTER — Ambulatory Visit: Admitting: Physician Assistant

## 2024-12-10 DIAGNOSIS — S42291A Other displaced fracture of upper end of right humerus, initial encounter for closed fracture: Secondary | ICD-10-CM | POA: Diagnosis not present

## 2024-12-10 NOTE — Progress Notes (Signed)
 "  Office Visit Note   Patient: [name]           Date of Birth: 1932-06-16           MRN: [id number] Visit Date: 12/10/2024              Requested by: No referring provider defined for this encounter. PCP: Pcp, No   Assessment & Plan: Visit Diagnoses:  1. Closed 3-part fracture of proximal end of right humerus, initial encounter     Plan: Impression is 11 weeks status post right proximal humerus fracture.  At this time, her fracture does not show any significant healing.  I like for her to remain nonweightbearing to the right upper extremity.  She will continue with range of motion exercises.  Follow-up in 4 to 5 weeks for repeat evaluation and repeat x-rays.  Call with concerns or questions.   Follow-Up Instructions: Return in about 4 weeks (around 01/07/2025).   Orders:  Orders Placed This Encounter  Procedures   XR Shoulder Right   No orders of the defined types were placed in this encounter.     Procedures: No procedures performed   Clinical Data: No additional findings.   Subjective: Chief Complaint  Patient presents with   Right Shoulder - Follow-up    Right proximal humerus fracture    HPI patient is a 89 year old pleasantly demented female who is resident of [facility] who comes in today approximately elevan weeks out right proximal humerus fracture.  She is still having some discomfort.  Unsure whether she is getting therapy at [facility] as she cannot remember.    Review of Systems as detailed in HPI.  All others reviewed and are negative.   Objective: Vital Signs: There were no vitals taken for this visit.  Physical Exam well-developed well-nourished female in no acute distress.  Ortho Exam right shoulder exam: Mild tenderness to the proximal shoulder.  Limited range of motion.  She is neurovascularly intact distally.  Specialty Comments:  No specialty comments available.  Imaging: XR Shoulder Right Result Date: 12/10/2024 X-rays  demonstrate persistent fracture lucency    PMFS History: Patient Active Problem List   Diagnosis Date Noted   Closed 3-part fracture of proximal end of right humerus 10/04/2024   AKI (acute kidney injury) 12/26/2023   COVID-19 virus infection 01/26/2023   Acute respiratory failure with hypoxia (HCC) 01/26/2023   Generalized weakness 01/26/2023   Chronic diastolic CHF (congestive heart failure) (HCC) 01/26/2023   Acute cystitis 08/07/2022   Acute cystitis without hematuria 08/06/2022   Scalp hematoma 08/06/2022   Fall 08/06/2022   Distal radial fracture 08/06/2022   Hypertensive urgency 08/06/2022   Paroxysmal atrial fibrillation (HCC) 08/06/2022   Chronic anticoagulation 08/06/2022   Acute on chronic renal insufficiency 08/06/2022   (HFpEF) heart failure with preserved ejection fraction (HCC) 08/06/2022   Closed right hip fracture, initial encounter (HCC) 09/26/2021   Head trauma 09/26/2021   Hypocalcemia 09/26/2021   Hyperglycemia 09/26/2021   Acute metabolic encephalopathy 08/10/2021   COVID-19 08/09/2021   History of syncope 08/13/2019   Syncope and collapse 01/23/2019   Leukocytosis 01/23/2019   Diarrhea 01/23/2019   Prolonged QT interval 01/23/2019   Acute confusional state 06/19/2015   Memory loss 06/19/2015   DM2 (diabetes mellitus, type 2) (HCC) 05/14/2015   Hyperlipidemia 05/14/2015   Essential hypertension    TIA (transient ischemic attack) 05/13/2015   Anemia, iron deficiency 12/12/2011   Fracture of proximal end of left humerus 12/11/2011  Hypoxia 12/11/2011   Wide-complex tachycardia 12/11/2011   Acute hypotension 12/11/2011   Past Medical History:  Diagnosis Date   Allergies    Alzheimer disease (HCC)    STABLE   Anemia, iron deficiency 12/12/2011   Arthritis    RF   CKD (chronic kidney disease)    STAGE 3   Diabetes mellitus    no mes, diet only    Diarrhea    RESOLVED   Hypercholesterolemia    Hyperlipemia    Hypertension    Major  depression    OA (osteoarthritis)    HAND/LEFT KNEE PAIN MILD   Osteoporosis    Stroke (HCC)    Syncope     Family History  Problem Relation Age of Onset   Heart Problems Mother    Heart attack Maternal Grandmother        died from a heart attack   Stroke Neg Hx    Arrhythmia Neg Hx     Past Surgical History:  Procedure Laterality Date   INTRAMEDULLARY (IM) NAIL INTERTROCHANTERIC Right 09/27/2021   Procedure: INTRAMEDULLARY (IM) NAIL INTERTROCHANTRIC;  Surgeon: Kendal Franky SQUIBB, MD;  Location: [facility] OR;  Service: Orthopedics;  Laterality: Right;   left knee arthroscopy     ORIF HUMERUS FRACTURE  12/09/2011   Procedure: OPEN REDUCTION INTERNAL FIXATION (ORIF) DISTAL HUMERUS FRACTURE;  Surgeon: Prentice LELON Pagan, MD;  Location: [facility];  Service: Orthopedics;  Laterality: Right;   OTHER SURGICAL HISTORY     left small toe bone spur removed    TONSILLECTOMY     Social History   Occupational History   Occupation: Retired  Tobacco Use   Smoking status: Former    Current packs/day: 0.00    Types: Cigarettes    Quit date: 12/29/2009    Years since quitting: 14.9   Smokeless tobacco: Never  Vaping Use   Vaping status: Never Used  Substance and Sexual Activity   Alcohol use: No    Alcohol/week: 0.0 standard drinks of alcohol   Drug use: No   Sexual activity: Never        "

## 2025-01-07 ENCOUNTER — Ambulatory Visit: Admitting: Physician Assistant
